# Patient Record
Sex: Male | Born: 1950 | Race: White | Hispanic: No | Marital: Married | Smoking: Former smoker
Health system: Southern US, Community
[De-identification: ages and names within clinical notes are randomized; demographics above are authoritative.]

## PROBLEM LIST (undated history)

## (undated) DIAGNOSIS — R4182 Altered mental status, unspecified: Secondary | ICD-10-CM

## (undated) DIAGNOSIS — A419 Sepsis, unspecified organism: Secondary | ICD-10-CM

## (undated) DIAGNOSIS — G894 Chronic pain syndrome: Secondary | ICD-10-CM

## (undated) DIAGNOSIS — J9621 Acute and chronic respiratory failure with hypoxia: Secondary | ICD-10-CM

## (undated) DIAGNOSIS — F411 Generalized anxiety disorder: Secondary | ICD-10-CM

## (undated) DIAGNOSIS — J309 Allergic rhinitis, unspecified: Secondary | ICD-10-CM

## (undated) DIAGNOSIS — F32A Depression, unspecified: Secondary | ICD-10-CM

## (undated) DIAGNOSIS — G7281 Critical illness myopathy: Secondary | ICD-10-CM

## (undated) DIAGNOSIS — I82C19 Acute embolism and thrombosis of unspecified internal jugular vein: Secondary | ICD-10-CM

## (undated) HISTORY — DX: Acute embolism and thrombosis of unspecified internal jugular vein: I82.C19

## (undated) HISTORY — DX: Chronic pain syndrome: G89.4

## (undated) HISTORY — DX: Acute and chronic respiratory failure with hypoxia: J96.21

## (undated) HISTORY — DX: Critical illness myopathy: G72.81

## (undated) HISTORY — DX: Altered mental status, unspecified: R41.82

## (undated) HISTORY — DX: Sepsis, unspecified organism: A41.9

---

## 2013-08-07 DIAGNOSIS — M5410 Radiculopathy, site unspecified: Secondary | ICD-10-CM | POA: Insufficient documentation

## 2019-08-04 DIAGNOSIS — M5417 Radiculopathy, lumbosacral region: Secondary | ICD-10-CM | POA: Insufficient documentation

## 2019-10-25 ENCOUNTER — Other Ambulatory Visit: Payer: Self-pay | Admitting: Internal Medicine

## 2019-10-25 DIAGNOSIS — G7281 Critical illness myopathy: Secondary | ICD-10-CM

## 2019-10-25 DIAGNOSIS — J9621 Acute and chronic respiratory failure with hypoxia: Secondary | ICD-10-CM

## 2019-10-25 DIAGNOSIS — R4182 Altered mental status, unspecified: Secondary | ICD-10-CM

## 2019-10-25 DIAGNOSIS — A419 Sepsis, unspecified organism: Secondary | ICD-10-CM

## 2019-10-25 DIAGNOSIS — I82C19 Acute embolism and thrombosis of unspecified internal jugular vein: Secondary | ICD-10-CM

## 2019-10-25 DIAGNOSIS — R6521 Severe sepsis with septic shock: Secondary | ICD-10-CM

## 2019-10-25 DIAGNOSIS — G894 Chronic pain syndrome: Secondary | ICD-10-CM

## 2019-10-26 ENCOUNTER — Other Ambulatory Visit: Payer: Self-pay | Admitting: Internal Medicine

## 2019-10-26 ENCOUNTER — Encounter: Payer: Self-pay | Admitting: Internal Medicine

## 2019-10-26 DIAGNOSIS — J9621 Acute and chronic respiratory failure with hypoxia: Secondary | ICD-10-CM

## 2019-10-26 DIAGNOSIS — G7281 Critical illness myopathy: Secondary | ICD-10-CM

## 2019-10-26 DIAGNOSIS — A419 Sepsis, unspecified organism: Secondary | ICD-10-CM | POA: Insufficient documentation

## 2019-10-26 DIAGNOSIS — I82C19 Acute embolism and thrombosis of unspecified internal jugular vein: Secondary | ICD-10-CM

## 2019-10-26 DIAGNOSIS — G894 Chronic pain syndrome: Secondary | ICD-10-CM | POA: Insufficient documentation

## 2019-10-26 DIAGNOSIS — R4182 Altered mental status, unspecified: Secondary | ICD-10-CM | POA: Insufficient documentation

## 2019-10-26 DIAGNOSIS — R6521 Severe sepsis with septic shock: Secondary | ICD-10-CM

## 2019-10-26 NOTE — Progress Notes (Signed)
[facility]  PROGRESS NOTE  PULMONARY SERVICE ROUNDS  Date of Service: 10/26/2019  Andrew Rivera  DOB: 1951/06/26  Referring physician: Larena Glassman, MD  HPI: Andrew Rivera is a 69 y.o. male  being seen for Acute on Chronic Respiratory Failure.  This morning the patient was on pressure support mode was requiring 45% oxygen.  The patient was on a pressure support level of 14/7 which the patient was tolerating relatively well.  Good tidal volumes were noted.  He does however have a great deal of anxiety issues and is requesting premedication before any weaning is done.  While I was in the room I switched him over to pressure support and actually his volumes were excellent he did not even notice that the changes were made on the ventilator.  It appears that anxiety is playing a major role and he agrees that he does have a lot of anxiety issues.  Review of Systems: Unremarkable other than noted in HPI  Allergies:  Reviewed on the [facility]  Medications: Reviewed  Vitals: Temperature 98.6 pulse 69 respiratory rate 22 blood pressure is 110/56 saturations 100%  Ventilator Settings: Temperature 98.6 pulse 69 respiratory rate 22 blood pressure 110/56 saturations 100%  Physical Exam: . General:  calm and comfortable NAD . Eyes: normal lids, irises & conjunctiva . ENT: grossly normal tongue not enlarged . Neck: no masses . Cardiovascular: S1 S2 Normal no rubs no gallop . Respiratory: Scattered rhonchi expansion is equal . Abdomen: soft non-distended . Skin: no rash seen on limited exam . Musculoskeletal:  no rigidity . Psychiatric: unable to assess . Neurologic: no involuntary movements          Lab Data and radiological Data:  No labs noted today   Assessment/Plan  Patient Active Problem List   Diagnosis Date Noted  . Acute on chronic respiratory failure with hypoxia (HCC)   . Septic shock (HCC)   . Altered mental status, unspecified   . Critical illness myopathy    . Chronic pain syndrome   . Internal jugular (IJ) vein thromboembolism, acute, unspecified laterality (HCC)       1. Acute on chronic respiratory failure with hypoxia patient was switched over to pressure support mode on 45% FiO2 as noted tidal volumes were 520 cc on a pressure support level of 14/7.  I spoke with the patient and I spoke with respiratory therapy we will try to continue to advance the weaning as tolerated. 2. Sepsis with shock resolved hemodynamics are stable we will continue with present management patient had MSSA infected hardware and will need ongoing antibiotic therapy 3. Altered mental state improved 4. Critical illness myopathy will need ongoing physical therapy for strengthening. 5. Chronic pain syndrome chronic pain pain medication and control per primary care team 6. Internal jugular thromboembolism the patient is on apixaban which should be continued.   I have personally evaluated the patient, evaluated the laboratory and imaging results and formulated the assessment and plan and placed orders as needed. The Patient requires high complexity decision making with multiple system involvement. Rounds were done with the Respiratory Therapy Director and respiratory therapist involved in the care of the patient as well as nursing staff.  Time 35 minutes extended discussion with the patient at bedside   Yevonne Pax, MD [facility] Pulmonary Critical Care Medicine   This note is for inpatient care

## 2019-10-26 NOTE — Progress Notes (Signed)
[facility]  [facility] PULMONARY SERVICE  Date of Service: 10/25/2019  PULMONARY CONSULT   Andrew Rivera  VPX:[id number]  DOB: 12-Jun-1951     Referring Physician: Larena Glassman, MD  HPI: Andrew Rivera is a 69 y.o. male seen for Acute on Chronic Respiratory Failure.  Patient has multiple medical problems including chronic pain syndrome multiple spinal fusion surgeries hyperlipidemia coronary artery disease MSSA bacteremia came into the hospital because of altered mental status.  Patient had apparently been suffering dizziness and lightheadedness prior to admission.  When the patient came in he was actually intubated but per the wife had been having disorientation fatigue.  Patient had no significant fevers or chills.  Patient subsequently failed to come off of the ventilator eventually ended up having to have a tracheostomy done.  It appears that prior to discharge patient was working with speech therapy with the PMV.  The tracheostomy was done on December 13 and patient also had a PEG tube placed on December 21.  Other complications included patient suffered critical illness myopathy and has had significant weakness.  Patient did undergo an EMG to confirm the critical illness myopathy.  He is now transferred to our facility for further management and weaning.  Review of Systems:  ROS performed and is unremarkable other than noted above.  Past Medical History Past Medical History:  Diagnosis Date  . Carpal tunnel syndrome 04/18/2013  . Cataract  . Coronary artery disease involving native coronary artery of native heart without angina pectoris 12/31/2018  . Pure hypercholesterolemia 12/31/2018  . Spinal cord stimulator status  . Varicella 1960   Past Surgical History Past Surgical History:  Procedure Laterality Date  . BACK SURGERY lower back  L4/L5  . CATARACT EXTRACTION  . COLONOSCOPY 2009  . CORNEA SURGERY  . EYE SURGERY JULY 2014  . FOOT SURGERY Right  . HERNIA REPAIR Right   inguinal  . KNEE ARTHROSCOPY W/ MENISCAL TRANSPLANT Left  . KNEE SURGERY  . LUMBAR DISCECTOMY 2006  . PR ALLOGRAFT FOR SPINE SURGERY ONLY MORSELIZED N/A 10/07/2013  Procedure: ALLOGRAFT FOR SPINE SURGERY ONLY; MORSELIZED; Surgeon: Nemiah Commander, MD; Location: MAIN OR [facility] Service: Orthopedics  . PR ANTERIOR INSTRUMENTATION 4-7 VERTEBRAL SEGMENTS N/A 10/07/2013  Procedure: ANT INSTRUM; 4 TO 7 VERTEB SEGMT CERVICAL; Surgeon: Nemiah Commander, MD; Location: MAIN OR [facility]; Service: Orthopedics  . PR APPLICATION INTERVERTEBRAL BIOMECHANICAL DEVICE N/A 10/07/2013  Procedure: APPLICATION OF INTERVERTEBRAL BIOCHEMICAL DEVICE(EG, SYNTHETIC CAGE/BONE DOWEL)TO VERTEBR DEFECT/INTERSPACE x3; Surgeon: Nemiah Commander, MD; Location: MAIN OR [facility]; Service: Orthopedics  . PR ARTHRODESIS ANT INTERBODY INC DISCECTOMY, CERVICAL BELOW C2 N/A 10/07/2013  Procedure: ARTHRODES, ANT INTRBDY, INCL DISC SPC PREP, DISCECT, OSTEOPHYT/DECOMPRESS SPINL CRD &/OR NRV RT, CRV BLO C2; Surgeon: Nemiah Commander, MD; Location: MAIN OR [facility]; Service: Orthopedics  . PR ARTHRODESIS ANT INTERBODY INC DISCECTOMY, CERVICAL BELOW C2 EACH ADDL N/A 10/07/2013  Procedure: ARTHROD, ANT INTBDY, INCL DISC SPC PREP/DISCTMY/OSTEPHYT/DECMPR SPNL CRD/NRV RT; CERV BELO C2, EA ADD`L SPC; Surgeon: Nemiah Commander, MD; Location: MAIN OR [facility]; Service: Orthopedics  . PR ARTHRODESIS ANT INTERBODY MIN DISCECTOMY, CERVICAL BELOW C2 N/A 10/07/2013  Procedure: ARTHRODESIS, ANTERIOR INTERBODY TECHNIQUE, INCLUDE MINIMAL DISKECTOMY TO PREP INTERSPACE; CERVICAL BELOW C2; Surgeon: Nemiah Commander, MD; Location: MAIN OR [facility]; Service: Orthopedics  . PR ARTHRODESIS ANT INTERBODY MIN DISCECTOMY,EA ADDL N/A 10/07/2013  Procedure: ARTHRODESIS, ANTERIOR INTERBODY TECHNIQUE, INCLUD MINIMAL DISKECTOMY TO PREP INTERSPAC; EA ADD`L INTERSPACE CERVICAL; Surgeon: Nemiah Commander, MD; Location: MAIN OR [facility]; Service: Orthopedics  .  PR ARTHRODESIS ANT INTERBODY MIN DISCECTOMY,EA ADDL N/A 04/21/2019  Procedure:  ARTHRODESIS, ANTERIOR INTERBODY TECHNIQUE, W/MINIMAL DISKECTOMY TO PREP INTERSPACE; EACH ADD`L INTERSPACE; Surgeon: Estill Batten, MD; Location: MAIN OR [facility]; Service: Neurosurgery  . PR ARTHRODESIS ANT INTERBODY MIN DISCECTOMY,LUMBAR N/A 04/21/2019  Procedure: ARTHRODESIS, ANTERIOR INTERBODY TECHNIQUE, INCLUDE MINIMAL DISKECTOMY TO PREPARE INTERSPACE; LUMBAR; Surgeon: Estill Batten, MD; Location: MAIN OR [facility]; Service: Neurosurgery  . PR ARTHRODESIS ANT INTERBODY MIN DISCECTOMY,LUMBAR N/A 04/21/2019  Procedure: ARTHRODESIS, ANTERIOR INTERBODY TECHNIQUE, INCLUDE MINIMAL DISKECTOMY TO PREPARE INTERSPACE; LUMBAR; Surgeon: Boykin Reaper, MD; Location: MAIN OR [facility]; Service: Vascular  . PR ARTHRODESIS POSTERIOR/POSTERIORLATERAL CERVICAL BELOW C2 N/A 11/19/2017  Procedure: ARTHRODESIS, POSTERIOR OR POSTEROLATERAL TECHNIQUE, SINGLE LEVEL; CERVICAL BELOW C2 SEGMENT; Surgeon: Nemiah Commander, MD; Location: [facility] OR [facility]; Service: Ortho Spine  . PR ARTHRODESIS POSTERIOR/POSTEROLATERAL EA ADDL N/A 11/19/2017  Procedure: ARTHRODESIS, POSTERIOR OR POSTEROLATERAL TECHNIQUE, SINGLE LEVEL; EACH ADDITIONAL VERTEBRAL SEGMENT x4; Surgeon: Nemiah Commander, MD; Location: [facility] OR [facility]; Service: Ortho Spine  . PR ARTHRODESIS POSTERIOR/POSTEROLATERAL EA ADDL Midline 04/24/2019  Procedure: ARTHRODESIS, POSTERIOR OR POSTEROLATERAL TECHNIQUE, SINGLE LEVEL; EACH ADDITIONAL VERTEBRAL SEGMENT; Surgeon: Estill Batten, MD; Location: MAIN OR [facility]; Service: Neurosurgery  . PR ARTHRODESIS POSTERIOR/POSTEROLATERAL LUMBAR N/A 04/24/2019  Procedure: ARTHRODESIS, POSTERIOR OR POSTEROLATERAL TECH, SINGLE LEVEL; LUMBAR(LATERAL TRANSVERSE TECHNIQUE IF DONE); Surgeon: Estill Batten, MD; Location: MAIN OR [facility]; Service: Neurosurgery  . PR ARTHRODESIS POSTERIOR/POSTEROLATERAL THORACIC Midline 04/24/2019  Procedure: ARTHRODESIS, POSTERIOR/POSTEROLATERAL TECH, SINGLE LEVEL; THORACIC(LATERAL TRANSVERSE TECHNIQUE IF  DONE); Surgeon: Estill Batten, MD; Location: MAIN OR [facility]; Service: Neurosurgery  . PR AUTOGRAFT SPINE SURGERY LOCAL FROM SAME INCISION N/A 10/07/2013  Procedure: AUTOGRAFT/SPINE SURG ONLY (W/HARVEST GRAFT); LOCAL (EG, RIB/SPINOUS PROC, LAM FRGMT) OBTAIN FROM SAME INCIS; Surgeon: Nemiah Commander, MD; Location: MAIN OR [facility]; Service: Orthopedics  . PR AUTOGRAFT SPINE SURGERY LOCAL FROM SAME INCISION N/A 11/19/2017  Procedure: AUTOGRAFT/SPINE SURG ONLY (W/HARVEST GRAFT); LOCAL (EG, RIB/SPINOUS PROC, LAM FRGMT) OBTAIN FROM SAME INCIS; Surgeon: Nemiah Commander, MD; [facility]; Service: Ortho Spine  . PR EXCHANGE LENS PROSTHESIS Left 04/11/2013  Procedure: EXCHANGE OF INTRAOCULAR LENS ZCB00 23.0, [id number] 22.0 and 21.5. Bimanual; Surgeon: Nadine Counts, MD; Location: ASC OR [facility]; Service: Ophthalmology  . PR EXCIS CERV DISK,ONE LEVEL N/A 11/19/2017  Procedure: LAMINOTOMY(HEMILAMINECT), DECOMPRES NERVE ROOTS, PART FACETECT/FORAMINOTOM &/OR EX DISC; 1 INTERSPCE CERVIC; Surgeon: Nemiah Commander, MD; Location[facility]; Service: Ortho Spine  . PR INSJ BIOMCHN DEV INTERVERTEBRAL DSC SPC W/ARTHRD N/A 04/21/2019  Procedure: INSERT INTERBODY BIOMECHANICAL DEVICE(S) WITH INTEGRAL ANTERIOR INSTRUMENT FOR DEVICE ANCHORING, WHEN PERFORMED, TO INTERVERTEBRAL DISC SPACE IN CONJUNCTION WITH INTERBODY ARTHRODESIS, EACH INTERSPACE; Surgeon: Estill Batten, MD; Location: MAIN OR [facility]; Service: Neurosurgery  . PR IONM 1 ON 1 IN OR W/ATTENDANCE EACH 15 MINUTES N/A 10/07/2013  Procedure: CONTINUOUS INTRAOPERATIVE NEUROPHYSIOLOGY MONITORING IN OR; Surgeon: Nemiah Commander, MD; Location: MAIN OR [facility]; Service: Orthopedics  . PR IONM 1 ON 1 IN OR W/ATTENDANCE EACH 15 MINUTES N/A 11/19/2017  Procedure: CONTINUOUS INTRAOPERATIVE NEUROPHYSIOLOGY MONITORING IN OR; Surgeon: Nemiah Commander, MD; Location: [facility] OR [facility]; Service: Ortho Spine  . PR IONM 1 ON 1 IN OR W/ATTENDANCE EACH 15 MINUTES N/A 04/21/2019  Procedure:  CONTINUOUS INTRAOPERATIVE NEUROPHYSIOLOGY MONITORING IN OR (sseps, emg); Surgeon: Estill Batten, MD; Location: MAIN OR [facility]; Service: Neurosurgery  . PR IONM 1 ON 1 IN OR W/ATTENDANCE EACH 15 MINUTES N/A 04/24/2019  Procedure: CONTINUOUS INTRAOPERATIVE NEUROPHYSIOLOGY MONITORING  IN OR (SSEPs, MEPs, EMG); Surgeon: Estill Batten, MD; Location: MAIN OR [facility]; Service: Neurosurgery  . PR LAMNOTMY W/DCMPRSN NRV EACH ADDL CRVCL/LMBR N/A 11/19/2017  Procedure: LAMINOTOMY(HEMILAMINECT), DECOMPRESS NERVE ROOT, PART FACETECT/FORAMENOTOMY;EA ADD`L INTERSPACE CERV/LUMBAR; Surgeon: Nemiah Commander, MD; Location: [facility]; Service: Ortho Spine  . PR OSTEOTOMY LUMB SP,POST,1 LVL Midline 04/24/2019  Procedure: Osteotomy Of Spine, Posterior Or Posterolateral Approach, 1 Segment; Lumbar; Surgeon: Estill Batten, MD; Location: MAIN OR [facility]; Service: Neurosurgery  . PR OSTEOTOMY,POST,EA ADDN SGMT Midline 04/24/2019  Procedure: Osteotomy Of Spine, Posterior Or Posterolateral Approach; 1 Vertebral Segment; Each Add`L Vertebral Segment; Surgeon: Estill Batten, MD; Location: MAIN OR [facility]; Service: Neurosurgery  . PR PELVIC FIXATION OTHER THAN SACRUM Midline 04/24/2019  Procedure: PELVIC FIXATION (ATTACHMENT OF CAUDAL END OF INSTRUMENTATION TO PELVIC BONY STRUCTURES) OTHER THAN SACRUM; Surgeon: Estill Batten, MD; Location: MAIN OR [facility]; Service: Neurosurgery  . PR POSTERIOR SEGMENTAL INSTRUMENTATION 3-6 VRT SEG N/A 11/19/2017  Procedure: POST SEGMT INSTRUM; 3 TO 6 VERTEB SEGMT CERVICAL; Surgeon: Nemiah Commander, MD; Location: [facility] OR [facility]; Service: Ortho Spine  . PR POSTERIOR SEGMENTAL INSTRUMENTATION 7-12 VRT SEG Midline 04/24/2019  Procedure: POSTERIOR SEGMENTAL INSTRUMENTATION; (EG, PEDICLE FIXATION, DUAL RODS W/MULT HOOKS/WIRES) 7-12 VERTEB SEGMT; Surgeon: Estill Batten, MD; Location: MAIN OR [facility]; Service: Neurosurgery  . PR REMV VERT BODY,CERV,ONE SGMT N/A 10/07/2013  Procedure: VERTEBRAL  CORPECTOMY-ANT W/DECOMP; CERV 1 SEGMT; Surgeon: Nemiah Commander, MD; Location: MAIN OR [facility]; Service: Orthopedics  . PR STEREOTACTIC COMP ASSIST PROC,SPINAL N/A 04/24/2019  Procedure: Stereotactic Computer-Assisted (Navigational) Procedure; Spine; Surgeon: Estill Batten, MD; Location: MAIN OR [facility]; Service: Neurosurgery  . PR XCAPSL CTRC RMVL INSJ IO LENS PROSTH W/O ECP Left 03/31/2013  Procedure: EXTRACAPSULAR CATARACT REMOVAL W/INSERTION OF INTRAOCULAR LENS PROSTHESIS, MANUAL OR MECHANICAL TECHNIQUE; Surgeon: Nadine Counts, MD; Location: [facility]; Service: Ophthalmology  . SPINAL CORD STIMULATOR IMPLANT 10/06/16  . SPINAL FUSION DEC 2014  . SPINE SURGERY   Family History Family History  Problem Relation Age of Onset  . Cataracts Father  . Squamous cell carcinoma Father  . Melanoma Father  . Cataracts Mother  . Colon cancer Mother  . Cancer Mother  none  . Anesthesia problems Neg Hx   Social History Social History   Socioeconomic History  . Marital status: Married  Spouse name: Not on file  . Number of children: Not on file  . Years of education: Not on file  . Highest education level: Not on file  Occupational History  . 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  Social Needs  . Financial resource strain: Not on file  . Food insecurity  Worry: Never true  Inability: Never true  . Transportation needs  Medical: Not on file  Non-medical: Not on file  Tobacco Use  . Smoking status: Former Smoker  Packs/day: 1.00  Years: 15.00  Pack years: 15.00  Types: Cigarettes    Allergies  Reviewed on the [facility]  Medications: Reviewed on Rounds  Physical Exam:  Vitals: Temperature 97.4 pulse 80 respiratory rate 20 blood pressure was 110/70 saturations 100%  Ventilator Settings mode of ventilation pressure assist control FiO2 is 45% tidal volume 572 with a PEEP of 7  . General: Comfortable at this time . Eyes: Grossly normal lids,  irises & conjunctiva . ENT: grossly tongue is normal . Neck: no obvious mass . Cardiovascular: S1-S2 normal no gallop or rub is noted .  Respiratory: No rhonchi coarse breath sounds . Abdomen: Soft and nontender . Skin: no rash seen on limited exam . Musculoskeletal: not rigid . Psychiatric:unable to assess . Neurologic: no seizure no involuntary movements         Labs on Admission:  Sodium 140 potassium 3.4 BUN 26 creatinine 0.5 White count 8.9 hemoglobin 7.2 hematocrit 23.4 platelet count 256 ABG pH 7.43 PCO2 48 PO2 152  Radiological Exams on Admission: * PORTABLE CHEST X-RAY  INDICATION: Shortness of breath  COMPARISON: January 2020  FINDINGS: Single frontal portable view of the chest is obtained. Right arm PICC terminating near the superior cavoatrial junction. Tracheostomy tube in place. Heart size and mediastinal contour enlarged but grossly unchanged. Spinal stimulator leads have been removed. Prior cervicothoracic posterior fusion.  The right hemidiaphragm is mildly elevated. There are bilateral pleural effusions and bilateral airspace opacities, right greater than left. There are skin folds overlying the left hemithorax.  IMPRESSION:    Bilateral pleural effusions and airspace opacities, right greater than left. Considerations include asymmetric CHF and/or superimposed pneumonia.  Right arm PICC terminating near the superior cavoatrial junction. Tracheostomy tube in place.  Electronically Signed by: Elmore Guise, MD, [facility] Electronically Signed on: 10/23/2019 6:06 PM Assessment/Plan Patient Active Problem List   Diagnosis Date Noted  . Acute on chronic respiratory failure with hypoxia ([place])   . Septic shock ([place])   . Altered mental status, unspecified   . Critical illness myopathy   . Chronic pain syndrome   . Internal jugular (IJ) vein thromboembolism, acute, unspecified laterality ([place])      1. Acute on chronic respiratory failure with  hypoxia the patient right now is on full support on pressure control mode patient's been on 45% FiO2 the plan is to continue with checking the spontaneous breathing trials and try to weaning the patient as tolerated. 2. Severe sepsis and shock secondary to MSSA bacteremia infected spinal hardware apparently patient had the spinal hardware removed when the cultures were positive for staph.  Right now hemodynamics are stable patient is improving clinically.  Has completed course of antibiotics we will continue with supportive care. 3. Altered mental state slow to improve plan is to continue to continue with supportive care patient has longstanding pain medication issues 4. Chronic pain syndrome continue with pain management 5. Critical illness myopathy seen by neurology at the other facility will need ongoing physical therapy 6. Internal jugular DVT patient is on apixaban monitor for any bleeding issues  I have personally seen and evaluated the patient, evaluated laboratory and imaging results, formulated the assessment and plan and placed orders. The Patient requires high complexity decision making with multiple systems involvement.  Case was discussed on Rounds with the Respiratory Therapy Staff Time Spent 87minutes  Allyne Gee, MD [facility]

## 2019-11-02 ENCOUNTER — Other Ambulatory Visit: Payer: Self-pay | Admitting: Internal Medicine

## 2019-11-02 DIAGNOSIS — G894 Chronic pain syndrome: Secondary | ICD-10-CM

## 2019-11-02 DIAGNOSIS — J9621 Acute and chronic respiratory failure with hypoxia: Secondary | ICD-10-CM

## 2019-11-02 DIAGNOSIS — G7281 Critical illness myopathy: Secondary | ICD-10-CM

## 2019-11-02 DIAGNOSIS — R6521 Severe sepsis with septic shock: Secondary | ICD-10-CM

## 2019-11-02 DIAGNOSIS — I82C19 Acute embolism and thrombosis of unspecified internal jugular vein: Secondary | ICD-10-CM

## 2019-11-02 DIAGNOSIS — A419 Sepsis, unspecified organism: Secondary | ICD-10-CM

## 2019-11-02 DIAGNOSIS — R4182 Altered mental status, unspecified: Secondary | ICD-10-CM

## 2019-11-02 DIAGNOSIS — G94 Other disorders of brain in diseases classified elsewhere: Secondary | ICD-10-CM

## 2019-11-02 NOTE — Progress Notes (Signed)
[facility]  PROGRESS NOTE  PULMONARY SERVICE ROUNDS  Date of Service: 11/02/2019  Andrew Rivera  DOB: [birth year]  Referring physician: Larena Glassman, MD  HPI: Andrew Rivera is a 69 y.o. male  being seen for Acute on Chronic Respiratory Failure.  Patient is weaning on pressure support has excellent volumes currently is on 12/5 he still however has a lot of anxiety issues  Review of Systems: Unremarkable other than noted in HPI  Allergies:  Reviewed on the [facility]  Medications: Reviewed  Vitals: Temperature 97.3 pulse 57 respiratory rate 16 blood pressure 106/50 saturations 100%  Ventilator Settings: Mode of ventilation pressure support FiO2 40% tidal volume 664 pressure support 12 PEEP 5  Physical Exam: . General:  calm and comfortable NAD . Eyes: normal lids, irises & conjunctiva . ENT: grossly normal tongue not enlarged . Neck: no masses . Cardiovascular: S1 S2 Normal no rubs no gallop . Respiratory: No rhonchi coarse breath sounds . Abdomen: soft non-distended . Skin: no rash seen on limited exam . Musculoskeletal:  no rigidity . Psychiatric: unable to assess . Neurologic: no involuntary movements          Lab Data and radiological Data:  No labs to report today   Assessment/Plan  Patient Active Problem List   Diagnosis Date Noted  . Acute on chronic respiratory failure with hypoxia (HCC)   . Septic shock (HCC)   . Altered mental status, unspecified   . Critical illness myopathy   . Chronic pain syndrome   . Internal jugular (IJ) vein thromboembolism, acute, unspecified laterality (HCC)       1. Acute on chronic respiratory failure with hypoxia plan is to continue with pressure support mode currently is on FiO2 of 40% tidal volume of 664 2. Sepsis with shock resolved 3. Altered mental state no change 4. Critical illness myopathy slow to improve 5. Chronic pain controlled 6. Thromboembolism treated we will continue to monitor   I have  personally evaluated the patient, evaluated the laboratory and imaging results and formulated the assessment and plan and placed orders as needed. The Patient requires high complexity decision making with multiple system involvement. Rounds were done with the Respiratory Therapy Director and respiratory therapist involved in the care of the patient as well as nursing staff.   Yevonne Pax, MD [facility] Pulmonary Critical Care Medicine   This note is for inpatient care

## 2019-11-03 ENCOUNTER — Other Ambulatory Visit: Payer: Self-pay | Admitting: Internal Medicine

## 2019-11-03 DIAGNOSIS — R4182 Altered mental status, unspecified: Secondary | ICD-10-CM

## 2019-11-03 DIAGNOSIS — A419 Sepsis, unspecified organism: Secondary | ICD-10-CM

## 2019-11-03 DIAGNOSIS — G7281 Critical illness myopathy: Secondary | ICD-10-CM

## 2019-11-03 DIAGNOSIS — J9621 Acute and chronic respiratory failure with hypoxia: Secondary | ICD-10-CM

## 2019-11-03 DIAGNOSIS — I82C19 Acute embolism and thrombosis of unspecified internal jugular vein: Secondary | ICD-10-CM

## 2019-11-03 DIAGNOSIS — R6521 Severe sepsis with septic shock: Secondary | ICD-10-CM

## 2019-11-03 DIAGNOSIS — G894 Chronic pain syndrome: Secondary | ICD-10-CM

## 2019-11-03 NOTE — Progress Notes (Signed)
[facility]  PROGRESS NOTE  PULMONARY SERVICE ROUNDS  Date of Service: 11/03/2019  Andrew Rivera  DOB: Jun 21, 1951  Referring physician: Larena Glassman, MD  HPI: Andrew Rivera is a 69 y.o. male  being seen for Acute on Chronic Respiratory Failure.  Patient is on full support currently on pressure control mode has been on 40% FiO2 the patient still continues to battle with issues with his anxiety  Review of Systems: Unremarkable other than noted in HPI  Allergies:  Reviewed on the [facility]  Medications: Reviewed  Vitals: Temperature 97.7 pulse 87 respiratory 28 blood pressure is 109/61 saturations 97%  Ventilator Settings: Mode of ventilation pressure assist control FiO2 40% tidal volume 468 PEEP 5 inspiratory pressure 16  Physical Exam: . General:  calm and comfortable NAD . Eyes: normal lids, irises & conjunctiva . ENT: grossly normal tongue not enlarged . Neck: no masses . Cardiovascular: S1 S2 Normal no rubs no gallop . Respiratory: Coarse breath sounds with a few scattered rhonchi . Abdomen: soft non-distended . Skin: no rash seen on limited exam . Musculoskeletal:  no rigidity . Psychiatric: unable to assess . Neurologic: no involuntary movements          Lab Data and radiological Data:  Sodium 140 potassium 3.1 BUN 32 creatinine 0.4 White count 5.7 hemoglobin 8.1 hematocrit 26.1 platelet count 242   Assessment/Plan  Patient Active Problem List   Diagnosis Date Noted  . Acute on chronic respiratory failure with hypoxia (HCC)   . Septic shock (HCC)   . Altered mental status, unspecified   . Critical illness myopathy   . Chronic pain syndrome   . Internal jugular (IJ) vein thromboembolism, acute, unspecified laterality (HCC)       1. Acute on chronic respiratory failure with hypoxia plan is to continue with assessing for daily weaning readiness.  Patient still has major issues with anxiety which is limiting [place] and able to wean 2. Sepsis with shock  resolved hemodynamics are stable 3. Altered mental status no change 4. Critical illness myopathy supportive care 5. Chronic pain syndrome we will continue present management 6. Internal jugular thrombosis treated we will continue with supportive care   I have personally evaluated the patient, evaluated the laboratory and imaging results and formulated the assessment and plan and placed orders as needed. The Patient requires high complexity decision making with multiple system involvement. Rounds were done with the Respiratory Therapy Director and respiratory therapist involved in the care of the patient as well as nursing staff.   Yevonne Pax, MD [facility] Pulmonary Critical Care Medicine   This note is for inpatient care

## 2019-11-04 ENCOUNTER — Other Ambulatory Visit (HOSPITAL_COMMUNITY): Payer: Medicare Other | Admitting: Internal Medicine

## 2019-11-04 DIAGNOSIS — J9621 Acute and chronic respiratory failure with hypoxia: Secondary | ICD-10-CM

## 2019-11-04 DIAGNOSIS — G894 Chronic pain syndrome: Secondary | ICD-10-CM

## 2019-11-04 DIAGNOSIS — A419 Sepsis, unspecified organism: Secondary | ICD-10-CM

## 2019-11-04 DIAGNOSIS — G7281 Critical illness myopathy: Secondary | ICD-10-CM

## 2019-11-04 DIAGNOSIS — I82C19 Acute embolism and thrombosis of unspecified internal jugular vein: Secondary | ICD-10-CM

## 2019-11-04 DIAGNOSIS — R6521 Severe sepsis with septic shock: Secondary | ICD-10-CM

## 2019-11-04 DIAGNOSIS — R4182 Altered mental status, unspecified: Secondary | ICD-10-CM

## 2019-11-04 NOTE — Progress Notes (Signed)
[facility]  PROGRESS NOTE  PULMONARY SERVICE ROUNDS  Date of Service: 11/04/2019  Emad Brechtel  DOB: 1951/04/14  Referring physician: Larena Glassman, MD  HPI: Andrew Rivera is a 69 y.o. male  being seen for Acute on Chronic Respiratory Failure.  Patient currently is on full support failing weaning attempts right now is on pressure control mode patient is able to do fine with pressure support however once he goes on T collar he gets into a great deal of anxiety  Review of Systems: Unremarkable other than noted in HPI  Allergies:  Reviewed on the [facility]  Medications: Reviewed  Vitals: Temperature 97.1 pulse 69 respiratory rate 20 blood pressure is 132/70 saturations 100%  Ventilator Settings: Mode of ventilation pressure assist control FiO2 40% tidal volume 443 and start pressure 16 PEEP 5  Physical Exam: . General:  calm and comfortable NAD . Eyes: normal lids, irises & conjunctiva . ENT: grossly normal tongue not enlarged . Neck: no masses . Cardiovascular: S1 S2 Normal no rubs no gallop . Respiratory: No rhonchi no rales are noted at this time . Abdomen: soft non-distended . Skin: no rash seen on limited exam . Musculoskeletal:  no rigidity . Psychiatric: unable to assess . Neurologic: no involuntary movements          Lab Data and radiological Data:  No labs to report today   Assessment/Plan  Patient Active Problem List   Diagnosis Date Noted  . Acute on chronic respiratory failure with hypoxia (HCC)   . Septic shock (HCC)   . Altered mental status, unspecified   . Critical illness myopathy   . Chronic pain syndrome   . Internal jugular (IJ) vein thromboembolism, acute, unspecified laterality (HCC)       1. Acute on chronic respiratory failure with hypoxia plan is to continue making attempts at weaning patient will go back into pressure support mode today and we will try T collar again 2. Altered mental status no changes we will continue with  supportive care he is at baseline 3. Critical illness myopathy no change 4. Chronic pain syndrome controlled 5. IJ thromboembolism treated we will continue with supportive care 6. Sepsis with shock hemodynamics are stable   I have personally evaluated the patient, evaluated the laboratory and imaging results and formulated the assessment and plan and placed orders as needed. The Patient requires high complexity decision making with multiple system involvement. Rounds were done with the Respiratory Therapy Director and respiratory therapist involved in the care of the patient as well as nursing staff.   Yevonne Pax, MD [facility] Pulmonary Critical Care Medicine   This note is for inpatient care

## 2019-11-05 ENCOUNTER — Other Ambulatory Visit (HOSPITAL_COMMUNITY): Payer: Medicare Other | Admitting: Internal Medicine

## 2019-11-05 DIAGNOSIS — A419 Sepsis, unspecified organism: Secondary | ICD-10-CM

## 2019-11-05 DIAGNOSIS — I82C19 Acute embolism and thrombosis of unspecified internal jugular vein: Secondary | ICD-10-CM

## 2019-11-05 DIAGNOSIS — J9621 Acute and chronic respiratory failure with hypoxia: Secondary | ICD-10-CM

## 2019-11-05 DIAGNOSIS — G7281 Critical illness myopathy: Secondary | ICD-10-CM

## 2019-11-05 DIAGNOSIS — G894 Chronic pain syndrome: Secondary | ICD-10-CM

## 2019-11-05 DIAGNOSIS — R4182 Altered mental status, unspecified: Secondary | ICD-10-CM

## 2019-11-05 DIAGNOSIS — R6521 Severe sepsis with septic shock: Secondary | ICD-10-CM

## 2019-11-05 NOTE — Progress Notes (Signed)
[facility]  PROGRESS NOTE  PULMONARY SERVICE ROUNDS  Date of Service: 11/05/2019  Andrew Rivera  DOB: [birth year]  Referring physician: Larena Glassman, MD  HPI: Andrew Rivera is a 69 y.o. male  being seen for Acute on Chronic Respiratory Failure.  Patient currently on full support on pressure control mode has been on 40% FiO2 the patient has significant elevation of the right hemidiaphragm concerning to be consistent with paralysis spoke with the team during rounds and recommended that we get a sniff test on the patient  Review of Systems: Unremarkable other than noted in HPI  Allergies:  Reviewed on the [facility]  Medications: Reviewed  Vitals: Temperature is 97.2 pulse 63 respiratory 22 blood pressure 140/84 saturations 98%  Ventilator Settings: On the vent pressure assist control FiO2 40% respiratory pressure 12 PEEP 5  Physical Exam: . General:  calm and comfortable NAD . Eyes: normal lids, irises & conjunctiva . ENT: grossly normal tongue not enlarged . Neck: no masses . Cardiovascular: S1 S2 Normal no rubs no gallop . Respiratory: No rhonchi coarse breath sounds . Abdomen: soft non-distended . Skin: no rash seen on limited exam . Musculoskeletal:  no rigidity . Psychiatric: unable to assess . Neurologic: no involuntary movements          Lab Data and radiological Data:  No labs today   Assessment/Plan  Patient Active Problem List   Diagnosis Date Noted  . Acute on chronic respiratory failure with hypoxia (HCC)   . Septic shock (HCC)   . Altered mental status, unspecified   . Critical illness myopathy   . Chronic pain syndrome   . Internal jugular (IJ) vein thromboembolism, acute, unspecified laterality (HCC)       1. Acute on chronic respiratory failure with hypoxia the plan is to continue with full vent support and continue to assess daily for weaning readiness.  Patient has significant diaphragmatic weakness this would explain why he is failing  on weaning trials 2. Sepsis with shock hemodynamics are stable right now 3. Altered mental state patient is at his baseline 4. Critical illness significant weakness noted we will continue with supportive care 5. Chronic pain controlled 6. IJ thrombosis anticoagulation supportive care   I have personally evaluated the patient, evaluated the laboratory and imaging results and formulated the assessment and plan and placed orders as needed.  Time 35 minutes The Patient requires high complexity decision making with multiple system involvement. Rounds were done with the Respiratory Therapy Director and respiratory therapist involved in the care of the patient as well as nursing staff.   Yevonne Pax, MD [facility] Pulmonary Critical Care Medicine   This note is for inpatient care

## 2019-11-06 ENCOUNTER — Other Ambulatory Visit (HOSPITAL_COMMUNITY): Payer: Medicare Other | Admitting: Internal Medicine

## 2019-11-06 DIAGNOSIS — G7281 Critical illness myopathy: Secondary | ICD-10-CM

## 2019-11-06 DIAGNOSIS — J9621 Acute and chronic respiratory failure with hypoxia: Secondary | ICD-10-CM

## 2019-11-06 DIAGNOSIS — R6521 Severe sepsis with septic shock: Secondary | ICD-10-CM

## 2019-11-06 DIAGNOSIS — G894 Chronic pain syndrome: Secondary | ICD-10-CM

## 2019-11-06 DIAGNOSIS — R4182 Altered mental status, unspecified: Secondary | ICD-10-CM

## 2019-11-06 DIAGNOSIS — A419 Sepsis, unspecified organism: Secondary | ICD-10-CM

## 2019-11-06 DIAGNOSIS — I82C19 Acute embolism and thrombosis of unspecified internal jugular vein: Secondary | ICD-10-CM

## 2019-11-06 NOTE — Progress Notes (Signed)
[facility]  PROGRESS NOTE  PULMONARY SERVICE ROUNDS  Date of Service: 11/06/2019  Andrew Rivera  DOB: [birth year]  Referring physician: Larena Glassman, MD  HPI: Andrew Rivera is a 69 y.o. male  being seen for Acute on Chronic Respiratory Failure.  Patient currently is on full support has been on pressure control mode on 40% FiO2 right now is on a PEEP of 5 good volumes are noted.  Mechanics however still quite poor and patient is not able to tolerate weaning.  The patient had a sniff test on and it appears the patient has bilateral decreased excursions of his diaphragm which may be suggestive of bilateral diaphragmatic weakness.  This obviously is going to make it quite difficult for him to be weaned off the ventilator for any prolonged periods of time  Review of Systems: Unremarkable other than noted in HPI  Allergies:  Reviewed on the [facility]  Medications: Reviewed  Vitals: Temperature 96.8 pulse 59 respiratory rate 16 blood pressure is 108/56 saturations 100%  Ventilator Settings: Mode of ventilation pressure assist control FiO2 40% and start pressure 16 PEEP 5  Physical Exam: . General:  calm and comfortable NAD . Eyes: normal lids, irises & conjunctiva . ENT: grossly normal tongue not enlarged . Neck: no masses . Cardiovascular: S1 S2 Normal no rubs no gallop . Respiratory: Coarse breath sounds no rhonchi . Abdomen: soft non-distended . Skin: no rash seen on limited exam . Musculoskeletal:  no rigidity . Psychiatric: unable to assess . Neurologic: no involuntary movements          Lab Data and radiological Data:  Sodium 140 potassium 3.3 BUN 30 creatinine 0.4 White count 5 hemoglobin 8.4 hematocrit 26.7 platelet count 225    Chest fluoroscopy, November 06, 2019.  HISTORY: Failure to wean from ventilator, evaluate diaphragmatic excursion, perform sniff test.    TECHNIQUE: Fluoroscopy was performed centered at the diaphragms during normal ventilator  driven respirations. Patient was then recently disconnected from the ventilator and asked to perform sniff.  COMPARISON: Prior chest x-rays and CT scan from several prior studies. Period  FINDINGS: Patient has chronic elevation of the right hemidiaphragm. This appeared to increase in the interim between 2017 studies and 2019 studies.  During normal ventilator driven respirations, there is very little diaphragmatic excursion on either side. During sniff test, there was also very little diaphragmatic excursion on either side. There was no paradoxical diaphragmatic motion detected.  IMPRESSION: 1. Chronic elevation right hemidiaphragm. 2. Very little diaphragmatic excursion on either side  Electronically Signed by: Ebony Hail, MD, [facility] Electronically Signed on: 11/06/2019 10:49 AM  Assessment/Plan  Patient Active Problem List   Diagnosis Date Noted  . Acute on chronic respiratory failure with hypoxia (HCC)   . Septic shock (HCC)   . Altered mental status, unspecified   . Critical illness myopathy   . Chronic pain syndrome   . Internal jugular (IJ) vein thromboembolism, acute, unspecified laterality (HCC)       1. Acute on chronic respiratory failure with hypoxia plan is to continue with full vent support the findings of the sniff test are poor prognostically.  And a think he is going to have a great deal of difficulty being completely liberated from the ventilator. 2. Sepsis with shock resolved hemodynamics are stable 3. Critical illness myopathy no change supportive care 4. Chronic pain syndrome controlled 5. IJ thrombosis treated we will continue to follow 6. Diaphragmatic weakness as already noted will be limiting factor for ability  to wean off the ventilator   I have personally evaluated the patient, evaluated the laboratory and imaging results and formulated the assessment and plan and placed orders as needed.  Time 35 minutes extending discussion with staff  and treatment team The Patient requires high complexity decision making with multiple system involvement. Rounds were done with the Respiratory Therapy Director and respiratory therapist involved in the care of the patient as well as nursing staff.   Allyne Gee, MD [facility] Pulmonary Critical Care Medicine   This note is for inpatient care

## 2019-11-07 ENCOUNTER — Other Ambulatory Visit (HOSPITAL_COMMUNITY): Payer: Medicare Other | Admitting: Internal Medicine

## 2019-11-07 DIAGNOSIS — I82C19 Acute embolism and thrombosis of unspecified internal jugular vein: Secondary | ICD-10-CM

## 2019-11-07 DIAGNOSIS — A419 Sepsis, unspecified organism: Secondary | ICD-10-CM

## 2019-11-07 DIAGNOSIS — G7281 Critical illness myopathy: Secondary | ICD-10-CM

## 2019-11-07 DIAGNOSIS — G894 Chronic pain syndrome: Secondary | ICD-10-CM

## 2019-11-07 DIAGNOSIS — R6521 Severe sepsis with septic shock: Secondary | ICD-10-CM

## 2019-11-07 DIAGNOSIS — R4182 Altered mental status, unspecified: Secondary | ICD-10-CM

## 2019-11-07 DIAGNOSIS — J9621 Acute and chronic respiratory failure with hypoxia: Secondary | ICD-10-CM

## 2019-11-07 NOTE — Progress Notes (Signed)
[facility]  PROGRESS NOTE  PULMONARY SERVICE ROUNDS  Date of Service: 11/07/2019  Andrew Rivera  DOB: November 19, 1950  Referring physician: Larena Glassman, MD  HPI: Andrew Rivera is a 69 y.o. male  being seen for Acute on Chronic Respiratory Failure.  Patient is on pressure support mode currently on 40% FiO2 good tidal volumes are noted at this time  Review of Systems: Unremarkable other than noted in HPI  Allergies:  Reviewed on the [facility]  Medications: Reviewed  Vitals: Temperature 97.2 pulse 58 respiratory rate 16 blood pressure is 118/54 saturations 100%  Ventilator Settings: Mode of ventilation pressure support FiO2 40% pressure support 10 PEEP 5 tidal volume 410  Physical Exam: . General:  calm and comfortable NAD . Eyes: normal lids, irises & conjunctiva . ENT: grossly normal tongue not enlarged . Neck: no masses . Cardiovascular: S1 S2 Normal no rubs no gallop . Respiratory: No rhonchi no rales are noted at this time . Abdomen: soft non-distended . Skin: no rash seen on limited exam . Musculoskeletal:  no rigidity . Psychiatric: unable to assess . Neurologic: no involuntary movements          Lab Data and radiological Data:  No labs to report   Assessment/Plan  Patient Active Problem List   Diagnosis Date Noted  . Acute on chronic respiratory failure with hypoxia (HCC)   . Septic shock (HCC)   . Altered mental status, unspecified   . Critical illness myopathy   . Chronic pain syndrome   . Internal jugular (IJ) vein thromboembolism, acute, unspecified laterality (HCC)       1. 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 Altered mental status no changes we will continue with supportive care 3. Critical illness myopathy therapy as tolerated 4. Chronic pain  syndrome controlled 5. IJ thromboembolism treated 6. Sepsis with shock resolved   I have personally evaluated the patient, evaluated the laboratory and imaging results and formulated the assessment and plan and placed orders as needed. The Patient requires high complexity decision making with multiple system involvement. Rounds were done with the Respiratory Therapy Director and respiratory therapist involved in the care of the patient as well as nursing staff.   Yevonne Pax, MD [facility] Pulmonary Critical Care Medicine   This note is for inpatient care

## 2019-11-08 ENCOUNTER — Other Ambulatory Visit (HOSPITAL_COMMUNITY): Payer: Medicare Other | Admitting: Internal Medicine

## 2019-11-08 DIAGNOSIS — G7281 Critical illness myopathy: Secondary | ICD-10-CM

## 2019-11-08 DIAGNOSIS — R6521 Severe sepsis with septic shock: Secondary | ICD-10-CM

## 2019-11-08 DIAGNOSIS — A419 Sepsis, unspecified organism: Secondary | ICD-10-CM

## 2019-11-08 DIAGNOSIS — J9621 Acute and chronic respiratory failure with hypoxia: Secondary | ICD-10-CM

## 2019-11-08 DIAGNOSIS — G894 Chronic pain syndrome: Secondary | ICD-10-CM

## 2019-11-08 DIAGNOSIS — I82C19 Acute embolism and thrombosis of unspecified internal jugular vein: Secondary | ICD-10-CM

## 2019-11-08 DIAGNOSIS — R4182 Altered mental status, unspecified: Secondary | ICD-10-CM

## 2019-11-08 NOTE — Progress Notes (Signed)
[facility]  PROGRESS NOTE  PULMONARY SERVICE ROUNDS  Date of Service: 11/08/2019  Andrew Rivera  DOB: [birth year]  Referring physician: Larena Glassman, MD  HPI: Andrew Rivera is a 69 y.o. male  being seen for Acute on Chronic Respiratory Failure.  Patient currently is on pressure support mode I spoke to him regarding the findings of his sniff test patient has diaphragmatic paresis and while it may not immediately recover we need to continue with making attempts at weaning him to try to improve his strength.  Review of Systems: Unremarkable other than noted in HPI  Allergies:  Reviewed on the [facility]  Medications: Reviewed  Vitals: Temperature 97.1 pulse 63 respiratory rate 16 blood pressure 110/52 saturations 97%  Ventilator Settings: Mode of ventilation pressure support FiO2 40% tidal volume 513 pressure support 10 PEEP 5  Physical Exam: . General:  calm and comfortable NAD . Eyes: normal lids, irises & conjunctiva . ENT: grossly normal tongue not enlarged . Neck: no masses . Cardiovascular: S1 S2 Normal no rubs no gallop . Respiratory: Scattered rhonchi expansion is equal . Abdomen: soft non-distended . Skin: no rash seen on limited exam . Musculoskeletal:  no rigidity . Psychiatric: unable to assess . Neurologic: no involuntary movements          Lab Data and radiological Data:  No labs today   Assessment/Plan  Patient Active Problem List   Diagnosis Date Noted  . Acute on chronic respiratory failure with hypoxia (HCC)   . Septic shock (HCC)   . Altered mental status, unspecified   . Critical illness myopathy   . Chronic pain syndrome   . Internal jugular (IJ) vein thromboembolism, acute, unspecified laterality (HCC)       1. Acute on chronic respiratory failure with hypoxia plan continue with weaning on pressure support as patient is able to tolerate 2. Severe sepsis with shock hemodynamics are stable 3. Altered mental status no  change 4. Critical illness myopathy patient is at baseline 5. Chronic pain syndrome controlled 6. Internal jugular vein thrombosis treated we will continue to monitor closely   I have personally evaluated the patient, evaluated the laboratory and imaging results and formulated the assessment and plan and placed orders as needed. The Patient requires high complexity decision making with multiple system involvement. Rounds were done with the Respiratory Therapy Director and respiratory therapist involved in the care of the patient as well as nursing staff.   Yevonne Pax, MD [facility] Pulmonary Critical Care Medicine   This note is for inpatient care

## 2019-11-09 ENCOUNTER — Other Ambulatory Visit (HOSPITAL_COMMUNITY): Payer: Medicare Other | Admitting: Internal Medicine

## 2019-11-09 DIAGNOSIS — G894 Chronic pain syndrome: Secondary | ICD-10-CM

## 2019-11-09 DIAGNOSIS — R6521 Severe sepsis with septic shock: Secondary | ICD-10-CM

## 2019-11-09 DIAGNOSIS — I82C19 Acute embolism and thrombosis of unspecified internal jugular vein: Secondary | ICD-10-CM

## 2019-11-09 DIAGNOSIS — G7281 Critical illness myopathy: Secondary | ICD-10-CM

## 2019-11-09 DIAGNOSIS — J9621 Acute and chronic respiratory failure with hypoxia: Secondary | ICD-10-CM

## 2019-11-09 DIAGNOSIS — A419 Sepsis, unspecified organism: Secondary | ICD-10-CM

## 2019-11-09 DIAGNOSIS — R4182 Altered mental status, unspecified: Secondary | ICD-10-CM

## 2019-11-09 NOTE — Progress Notes (Signed)
[facility]  PROGRESS NOTE  PULMONARY SERVICE ROUNDS  Date of Service: 11/09/2019  Andrew Rivera  DOB: [birth year]  Referring physician: Larena Glassman, MD  HPI: Andrew Rivera is a 69 y.o. male  being seen for Acute on Chronic Respiratory Failure.  Patient is on the ventilator no change has been still having a great deal of difficulty tolerating any weaning attempts.  Right now is on pressure control mode and is requiring 60% FiO2 the patient has a weak diaphragm which is limiting [place] in terms of being able to do any weaning with him at this time but we need to continue to do pressure support so that we can encourage strengthening  Review of Systems: Unremarkable other than noted in HPI  Allergies:  Reviewed on the [facility]  Medications: Reviewed  Vitals: Temperature 97.2 pulse 63 respiratory rate 16 blood pressure is 120/56 saturations 97%  Ventilator Settings: Mode of ventilation pressure assist control FiO2 60% tidal volume 503 inspiratory pressure 16 PEEP 7  Physical Exam: . General:  calm and comfortable NAD . Eyes: normal lids, irises & conjunctiva . ENT: grossly normal tongue not enlarged . Neck: no masses . Cardiovascular: S1 S2 Normal no rubs no gallop . Respiratory: No rhonchi coarse breath sounds noted . Abdomen: soft non-distended . Skin: no rash seen on limited exam . Musculoskeletal:  no rigidity . Psychiatric: unable to assess . Neurologic: no involuntary movements          Lab Data and radiological Data:  No labs to report   Assessment/Plan  Patient Active Problem List   Diagnosis Date Noted  . Acute on chronic respiratory failure with hypoxia (HCC)   . Septic shock (HCC)   . Altered mental status, unspecified   . Critical illness myopathy   . Chronic pain syndrome   . Internal jugular (IJ) vein thromboembolism, acute, unspecified laterality (HCC)       1. Acute on chronic respiratory failure with hypoxia plan is to continue with full  support on pressure control mode patient is on 60% FiO2 has been not tolerating spontaneous breathing trials very well so as an alternative we can try weaning him on pressure support mode 2. Altered mental status he is at baseline right now improved 3. Critical illness myopathy significant weakness still present 4. Chronic pain syndrome controlled we will continue with pain management 5. IJ thromboembolism treated we will continue with supportive care 6. Sepsis with shock resolved   I have personally evaluated the patient, evaluated the laboratory and imaging results and formulated the assessment and plan and placed orders as needed.  Time 35 minutes The Patient requires high complexity decision making with multiple system involvement. Rounds were done with the Respiratory Therapy Director and respiratory therapist involved in the care of the patient as well as nursing staff.   Yevonne Pax, MD [facility] Pulmonary Critical Care Medicine   This note is for inpatient care

## 2019-11-10 ENCOUNTER — Other Ambulatory Visit (HOSPITAL_COMMUNITY): Payer: Medicare Other | Admitting: Internal Medicine

## 2019-11-10 DIAGNOSIS — R6521 Severe sepsis with septic shock: Secondary | ICD-10-CM

## 2019-11-10 DIAGNOSIS — R4182 Altered mental status, unspecified: Secondary | ICD-10-CM

## 2019-11-10 DIAGNOSIS — A419 Sepsis, unspecified organism: Secondary | ICD-10-CM

## 2019-11-10 DIAGNOSIS — I82C19 Acute embolism and thrombosis of unspecified internal jugular vein: Secondary | ICD-10-CM

## 2019-11-10 DIAGNOSIS — G7281 Critical illness myopathy: Secondary | ICD-10-CM

## 2019-11-10 DIAGNOSIS — G894 Chronic pain syndrome: Secondary | ICD-10-CM

## 2019-11-10 DIAGNOSIS — J9621 Acute and chronic respiratory failure with hypoxia: Secondary | ICD-10-CM

## 2019-11-10 NOTE — Progress Notes (Signed)
[place] NOTE  PULMONARY SERVICE ROUNDS  Date of Service: 11/10/2019  Lamoine Magallon  DOB: July 23, 1951  Referring physician: Deanne Coffer, MD  HPI: Deveon Kisiel is a 69 y.o. male  being seen for Acute on Chronic Respiratory Failure.  Patient was doing well this morning however reportedly had increased work of breathing noted and had an ABG done which showed pH of 7.30 PCO2 of 70 patient's respiratory rate was increased oxygenation was not a major issue.  Also patient had a chest x-ray done which did not reveal pneumothorax but did have atelectasis and infiltrates.  Review of Systems: Unremarkable other than noted in HPI  Allergies:  Reviewed on the [facility]  Medications: Reviewed  Vitals: Temperature 97.1 pulse 90 respiratory rate 17 blood pressure 94/60 saturations 99%  Ventilator Settings: Mode of ventilation pressure assist control FiO2 60% tidal line 445 respiratory pressure 16 PEEP 7  Physical Exam: . General:  calm and comfortable NAD . Eyes: normal lids, irises & conjunctiva . ENT: grossly normal tongue not enlarged . Neck: no masses . Cardiovascular: S1 S2 Normal no rubs no gallop . Respiratory: Scattered coarse breath sounds diminished at the base . Abdomen: soft non-distended . Skin: no rash seen on limited exam . Musculoskeletal:  no rigidity . Psychiatric: unable to assess . Neurologic: no involuntary movements          Lab Data and radiological Data:  Sodium 138 potassium 3.1 BUN 48 creatinine 0.9 ABG pH 7.30 PCO2 70 PO2 73   EXAM: Portable chest one view on 11/10/2019 at 1859  hours.  HISTORY: J96.20 Acute and chronic respiratory failure, unspecified whether with hypoxia or hypercapnia . Shortness of breath  TECHNIQUE: AP portable view of the chest was performed.   FINDINGS/IMPRESSION:  1. No significant change from most recent exam. 2. Persistent bilateral pleural effusions and bilateral lower lung zone infiltrates or  atelectasis very similar in appearance to previous exam. 3. Tracheostomy tube tip is again seen with tip at level of clavicles.   Electronically Signed by: Cecilio Asper, MD, [facility] Radiology Electronically Signed on: 11/10/2019 7:13 PM  Assessment/Plan  Patient Active Problem List   Diagnosis Date Noted  . Acute on chronic respiratory failure with hypoxia ([place])   . Septic shock ([place])   . Altered mental status, unspecified   . Critical illness myopathy   . Chronic pain syndrome   . Internal jugular (IJ) vein thromboembolism, acute, unspecified laterality ([place])       1. Acute on chronic respiratory failure with hypoxia with acute changes noted no obvious pneumothorax no worsening of mucous plugging atelectasis patient had increased work of breathing noted.  He was also more somnolent.  Unclear if the patient had medication which might be causing the change in mental status.  I did recommend that we increase his ventilatory support for now and keep him on the higher support overnight and will reassess in the morning.  We can monitor with ABGs overnight 2. Altered mental status as mentioned above change which is likely secondary to the increased PCO2 noted on the ABG will try to blow off some of the CO2 by increasing the ventilation. 3. Sepsis with shock resolved hemodynamics are stable 4. Critical illness myopathy no change 5. Chronic pain syndrome controlled 6. IJ thrombosis patient has been on anticoagulation if the issues do not resolve we will consider CT angio   I have personally evaluated the patient, evaluated the laboratory and imaging results and formulated the  assessment and plan and placed orders as needed.  Time 35 minutes acute change in status The Patient requires high complexity decision making with multiple system involvement. Rounds were done with the Respiratory Therapy Director and respiratory therapist involved in the care of the patient as well as nursing  staff.   Yevonne Pax, MD [facility] Pulmonary Critical Care Medicine   This note is for inpatient care

## 2019-11-11 ENCOUNTER — Other Ambulatory Visit (HOSPITAL_COMMUNITY): Payer: Medicare Other | Admitting: Internal Medicine

## 2019-11-11 DIAGNOSIS — A419 Sepsis, unspecified organism: Secondary | ICD-10-CM

## 2019-11-11 DIAGNOSIS — J9621 Acute and chronic respiratory failure with hypoxia: Secondary | ICD-10-CM

## 2019-11-11 DIAGNOSIS — R4182 Altered mental status, unspecified: Secondary | ICD-10-CM

## 2019-11-11 DIAGNOSIS — R6521 Severe sepsis with septic shock: Secondary | ICD-10-CM

## 2019-11-11 DIAGNOSIS — I82C19 Acute embolism and thrombosis of unspecified internal jugular vein: Secondary | ICD-10-CM

## 2019-11-11 DIAGNOSIS — G7281 Critical illness myopathy: Secondary | ICD-10-CM

## 2019-11-11 DIAGNOSIS — G894 Chronic pain syndrome: Secondary | ICD-10-CM

## 2019-11-11 NOTE — Progress Notes (Signed)
[facility]  PROGRESS NOTE  PULMONARY SERVICE ROUNDS  Date of Service: 11/11/2019  Andrew Rivera  DOB: November 09, 1950  Referring physician: Larena Glassman, MD  HPI: Andrew Rivera is a 69 y.o. male  being seen for Acute on Chronic Respiratory Failure.  Patient looks a little bit better ABG also improved this morning.  The patient's pH was 7.46 and PCO2 was down to 43 he is more awake.  It appears the patient did get some sedation yesterday which likely explains why he became so hypercapnic  Review of Systems: Unremarkable other than noted in HPI  Allergies:  Reviewed on the [facility]  Medications: Reviewed  Vitals: Temperature 99.2 pulse 80 respiratory rate 25 blood pressure is 113/56 saturations 99%  Ventilator Settings: Mode ventilation pressure assist control FiO2 40% expiratory pressure 22 tidal volume 486 PEEP 5  Physical Exam: . General:  calm and comfortable NAD . Eyes: normal lids, irises & conjunctiva . ENT: grossly normal tongue not enlarged . Neck: no masses . Cardiovascular: S1 S2 Normal no rubs no gallop . Respiratory: No rhonchi no rales are noted at this time . Abdomen: soft non-distended . Skin: no rash seen on limited exam . Musculoskeletal:  no rigidity . Psychiatric: unable to assess . Neurologic: no involuntary movements          Lab Data and radiological Data:  Sodium 137 potassium 3.1 BUN 66 creatinine 1.1 White count 27.9 hemoglobin 7.5 hematocrit 23.7 platelet count 130   Assessment/Plan  Patient Active Problem List   Diagnosis Date Noted  . Acute on chronic respiratory failure with hypoxia (HCC)   . Septic shock (HCC)   . Altered mental status, unspecified   . Critical illness myopathy   . Chronic pain syndrome   . Internal jugular (IJ) vein thromboembolism, acute, unspecified laterality (HCC)       1. Acute on chronic respiratory failure with hypoxia plan is to continue with full support on pressure control mode at this time his  ABG is now slightly overcorrected will need to back off on his respiratory rate 2. Sepsis with shock resolved hemodynamics are stable 3. Altered mental state improving 4. Critical illness myopathy therapy as tolerated 5. Chronic pain treated 6. Internal jugular thrombosis no change we will continue with supportive care   I have personally evaluated the patient, evaluated the laboratory and imaging results and formulated the assessment and plan and placed orders as needed. The Patient requires high complexity decision making with multiple system involvement. Rounds were done with the Respiratory Therapy Director and respiratory therapist involved in the care of the patient as well as nursing staff.   Yevonne Pax, MD [facility] Pulmonary Critical Care Medicine   This note is for inpatient care

## 2019-11-12 ENCOUNTER — Other Ambulatory Visit (HOSPITAL_COMMUNITY): Payer: Medicare Other | Admitting: Internal Medicine

## 2019-11-12 DIAGNOSIS — A419 Sepsis, unspecified organism: Secondary | ICD-10-CM

## 2019-11-12 DIAGNOSIS — G7281 Critical illness myopathy: Secondary | ICD-10-CM

## 2019-11-12 DIAGNOSIS — R4182 Altered mental status, unspecified: Secondary | ICD-10-CM

## 2019-11-12 DIAGNOSIS — R6521 Severe sepsis with septic shock: Secondary | ICD-10-CM

## 2019-11-12 DIAGNOSIS — G894 Chronic pain syndrome: Secondary | ICD-10-CM

## 2019-11-12 DIAGNOSIS — I82C19 Acute embolism and thrombosis of unspecified internal jugular vein: Secondary | ICD-10-CM

## 2019-11-12 DIAGNOSIS — J9621 Acute and chronic respiratory failure with hypoxia: Secondary | ICD-10-CM

## 2019-11-12 NOTE — Progress Notes (Signed)
[facility]  PROGRESS NOTE  PULMONARY SERVICE ROUNDS  Date of Service: 11/12/2019  Andrew Rivera  DOB: 1951-08-23  Referring physician: Larena Glassman, MD  HPI: Andrew Rivera is a 69 y.o. male  being seen for Acute on Chronic Respiratory Failure.  Patient is on full support currently is on pressure control mode on 35% FiO2 good saturations are noted  Review of Systems: Unremarkable other than noted in HPI  Allergies:  Reviewed on the [facility]  Medications: Reviewed  Vitals: Temperature 98.7 pulse 79 respiratory 25 blood pressure is 104/55 saturations 97%  Ventilator Settings: Mode of ventilation pressure assist control FiO2 35% tidal volume 502 PEEP 5 inspiratory pressure 18  Physical Exam: . General:  calm and comfortable NAD . Eyes: normal lids, irises & conjunctiva . ENT: grossly normal tongue not enlarged . Neck: no masses . Cardiovascular: S1 S2 Normal no rubs no gallop . Respiratory: No rhonchi no rales are noted at this time . Abdomen: soft non-distended . Skin: no rash seen on limited exam . Musculoskeletal:  no rigidity . Psychiatric: unable to assess . Neurologic: no involuntary movements          Lab Data and radiological Data:  Sodium 139 potassium 3.2 BUN 73 creatinine 1.0 White count 29.2 hemoglobin 7.6 hematocrit 23.2 platelet count 154   Assessment/Plan  Patient Active Problem List   Diagnosis Date Noted  . Acute on chronic respiratory failure with hypoxia (HCC)   . Septic shock (HCC)   . Altered mental status, unspecified   . Critical illness myopathy   . Chronic pain syndrome   . Internal jugular (IJ) vein thromboembolism, acute, unspecified laterality (HCC)       1. Acute on chronic respiratory failure hypoxia patient continues on full support pressure control right now is on 35% FiO2 patient's mechanics support not weaning 2. Diaphragmatic weakness we will continue with supportive care 3. Critical illness myopathy physical  therapy as tolerated 4. Chronic pain controlled IJ thrombosis treated 5. Sepsis with shock resolved   I have personally evaluated the patient, evaluated the laboratory and imaging results and formulated the assessment and plan and placed orders as needed. The Patient requires high complexity decision making with multiple system involvement. Rounds were done with the Respiratory Therapy Director and respiratory therapist involved in the care of the patient as well as nursing staff.   Yevonne Pax, MD [facility] Pulmonary Critical Care Medicine   This note is for inpatient care

## 2019-11-13 ENCOUNTER — Other Ambulatory Visit (HOSPITAL_COMMUNITY): Payer: Medicare Other | Admitting: Internal Medicine

## 2019-11-13 DIAGNOSIS — A419 Sepsis, unspecified organism: Secondary | ICD-10-CM

## 2019-11-13 DIAGNOSIS — J9621 Acute and chronic respiratory failure with hypoxia: Secondary | ICD-10-CM

## 2019-11-13 DIAGNOSIS — R6521 Severe sepsis with septic shock: Secondary | ICD-10-CM

## 2019-11-13 DIAGNOSIS — I82C19 Acute embolism and thrombosis of unspecified internal jugular vein: Secondary | ICD-10-CM

## 2019-11-13 DIAGNOSIS — R4182 Altered mental status, unspecified: Secondary | ICD-10-CM

## 2019-11-13 DIAGNOSIS — G7281 Critical illness myopathy: Secondary | ICD-10-CM

## 2019-11-13 DIAGNOSIS — G894 Chronic pain syndrome: Secondary | ICD-10-CM

## 2019-11-13 NOTE — Progress Notes (Signed)
[facility]  PROGRESS NOTE  PULMONARY SERVICE ROUNDS  Date of Service: 11/13/2019  Destin Vinsant  DOB: Aug 21, 1951  Referring physician: Larena Glassman, MD  HPI: Andrew Rivera is a 69 y.o. male  being seen for Acute on Chronic Respiratory Failure.  Patient remains on full support on pressure control mode has been on 35% FiO2 with a respiratory pressure of 18 has also been on Levophed for low blood pressure which is being gradually weaned off  Review of Systems: Unremarkable other than noted in HPI  Allergies:  Reviewed on the [facility]  Medications: Reviewed  Vitals: Temperature 98.6 pulse 85 respiratory rate 26 blood pressure 146/62 saturations 96%  Ventilator Settings: Mode of ventilation pressure assist control FiO2 35% tidal volume 566 PEEP 5 inspiratory pressures 18  Physical Exam: . General:  calm and comfortable NAD . Eyes: normal lids, irises & conjunctiva . ENT: grossly normal tongue not enlarged . Neck: no masses . Cardiovascular: S1 S2 Normal no rubs no gallop . Respiratory: No rhonchi coarse breath sounds are noted . Abdomen: soft non-distended . Skin: no rash seen on limited exam . Musculoskeletal:  no rigidity . Psychiatric: unable to assess . Neurologic: no involuntary movements          Lab Data and radiological Data:  Sodium 140 potassium 2.9 BUN 67 creatinine 0.9 White count 28.1 hemoglobin 7.2 hematocrit 22.4 platelet count 153   Assessment/Plan  Patient Active Problem List   Diagnosis Date Noted  . Acute on chronic respiratory failure with hypoxia (HCC)   . Septic shock (HCC)   . Altered mental status, unspecified   . Critical illness myopathy   . Chronic pain syndrome   . Internal jugular (IJ) vein thromboembolism, acute, unspecified laterality (HCC)       1. Acute on chronic respiratory failure hypoxia plan is to continue with full support on pressure control mode titrate oxygen continue pulmonary toilet patient still on Levophed  which is being weaned has not been tolerating any weaning of the ventilator unfortunately 2. Sepsis with shock on Levophed we will continue with supportive care titrate to maintain MAP greater than 65 3. Altered mental state no change we will continue present management 4. Critical illness myopathy stable 5. Chronic pain syndrome continue with present management 6. IJ thrombosis treated   I have personally evaluated the patient, evaluated the laboratory and imaging results and formulated the assessment and plan and placed orders as needed.  Time 35 minutes titration of drips The Patient requires high complexity decision making with multiple system involvement. Rounds were done with the Respiratory Therapy Director and respiratory therapist involved in the care of the patient as well as nursing staff.   Yevonne Pax, MD [facility] Pulmonary Critical Care Medicine   This note is for inpatient care

## 2019-11-14 ENCOUNTER — Other Ambulatory Visit (HOSPITAL_COMMUNITY): Payer: Medicare Other | Admitting: Internal Medicine

## 2019-11-14 DIAGNOSIS — J9621 Acute and chronic respiratory failure with hypoxia: Secondary | ICD-10-CM

## 2019-11-14 DIAGNOSIS — R6521 Severe sepsis with septic shock: Secondary | ICD-10-CM

## 2019-11-14 DIAGNOSIS — A419 Sepsis, unspecified organism: Secondary | ICD-10-CM

## 2019-11-14 DIAGNOSIS — R4182 Altered mental status, unspecified: Secondary | ICD-10-CM

## 2019-11-14 DIAGNOSIS — G7281 Critical illness myopathy: Secondary | ICD-10-CM

## 2019-11-14 DIAGNOSIS — G894 Chronic pain syndrome: Secondary | ICD-10-CM

## 2019-11-14 DIAGNOSIS — I82C19 Acute embolism and thrombosis of unspecified internal jugular vein: Secondary | ICD-10-CM

## 2019-11-14 NOTE — Progress Notes (Signed)
[facility]  PROGRESS NOTE  PULMONARY SERVICE ROUNDS  Date of Service: 11/14/2019  Andrew Rivera  DOB: 20-Jan-1951  Referring physician: Larena Glassman, MD  HPI: Andrew Rivera is a 69 y.o. male  being seen for Acute on Chronic Respiratory Failure.  Patient currently is on full support and pressure control mode has been on 40% FiO2.  Patient's Decent tidal volumes on full support however does not do as well when switched over to spontaneous  Review of Systems: Unremarkable other than noted in HPI  Allergies:  Reviewed on the [facility]  Medications: Reviewed  Vitals: Temperature 97.8 pulse 80 respiratory rate 26 blood pressure 137/62 saturations 97%  Ventilator Settings: Mode of ventilation pressure assist control FiO2 is 40% tidal volume 452 PEEP 5  Physical Exam: . General:  calm and comfortable NAD . Eyes: normal lids, irises & conjunctiva . ENT: grossly normal tongue not enlarged . Neck: no masses . Cardiovascular: S1 S2 Normal no rubs no gallop . Respiratory: No rhonchi no rales are noted at this time . Abdomen: soft non-distended . Skin: no rash seen on limited exam . Musculoskeletal:  no rigidity . Psychiatric: unable to assess . Neurologic: no involuntary movements          Lab Data and radiological Data:  Data has been reviewed   Assessment/Plan  Patient Active Problem List   Diagnosis Date Noted  . Acute on chronic respiratory failure with hypoxia (HCC)   . Septic shock (HCC)   . Altered mental status, unspecified   . Critical illness myopathy   . Chronic pain syndrome   . Internal jugular (IJ) vein thromboembolism, acute, unspecified laterality (HCC)       1. Acute on chronic respiratory failure with hypoxia plan is going to be to continue with the attempts at pressure support weaning.  Right now is on full support and pressure control mode 2. Sepsis with shock resolved 3. Altered mental status no change 4. Critical illness myopathy physical  therapy as tolerated 5. Chronic pain controlled 6. IJ thrombosis anticoagulation   I have personally evaluated the patient, evaluated the laboratory and imaging results and formulated the assessment and plan and placed orders as needed. The Patient requires high complexity decision making with multiple system involvement. Rounds were done with the Respiratory Therapy Director and respiratory therapist involved in the care of the patient as well as nursing staff.   Yevonne Pax, MD [facility] Pulmonary Critical Care Medicine   This note is for inpatient care

## 2019-11-15 ENCOUNTER — Other Ambulatory Visit (HOSPITAL_COMMUNITY): Payer: Medicare Other | Admitting: Internal Medicine

## 2019-11-15 DIAGNOSIS — A419 Sepsis, unspecified organism: Secondary | ICD-10-CM

## 2019-11-15 DIAGNOSIS — R4182 Altered mental status, unspecified: Secondary | ICD-10-CM

## 2019-11-15 DIAGNOSIS — G894 Chronic pain syndrome: Secondary | ICD-10-CM

## 2019-11-15 DIAGNOSIS — J9621 Acute and chronic respiratory failure with hypoxia: Secondary | ICD-10-CM

## 2019-11-15 DIAGNOSIS — G7281 Critical illness myopathy: Secondary | ICD-10-CM

## 2019-11-15 DIAGNOSIS — R6521 Severe sepsis with septic shock: Secondary | ICD-10-CM

## 2019-11-15 DIAGNOSIS — I82C19 Acute embolism and thrombosis of unspecified internal jugular vein: Secondary | ICD-10-CM

## 2019-11-15 NOTE — Progress Notes (Signed)
[facility]  PROGRESS NOTE  PULMONARY SERVICE ROUNDS  Date of Service: 11/15/2019  Andrew Rivera  DOB: [birth year]  Referring physician: Larena Glassman, MD  HPI: Andrew Rivera is a 69 y.o. male  being seen for Acute on Chronic Respiratory Failure.  Patient currently is on full support on pressure control mode has been on 35% FiO2 good saturations are noted  Review of Systems: Unremarkable other than noted in HPI  Allergies:  Reviewed on the [facility]  Medications: Reviewed  Vitals: Temperature 97.2 pulse 71 respiratory 26 blood pressure is 130/60 saturations 97%  Ventilator Settings: Mode of ventilation pressure assist control FiO2 35% tidal volume 449 PEEP 5 inspiratory pressures 18  Physical Exam: . General:  calm and comfortable NAD . Eyes: normal lids, irises & conjunctiva . ENT: grossly normal tongue not enlarged . Neck: no masses . Cardiovascular: S1 S2 Normal no rubs no gallop . Respiratory: No rhonchi no rales are noted at this time . Abdomen: soft non-distended . Skin: no rash seen on limited exam . Musculoskeletal:  no rigidity . Psychiatric: unable to assess . Neurologic: no involuntary movements          Lab Data and radiological Data:  Sodium 145 potassium 3.8 BUN 63 creatinine 0.7 White count 20 hemoglobin 7.2 hematocrit 25.2 platelet count 192   Assessment/Plan  Patient Active Problem List   Diagnosis Date Noted  . Acute on chronic respiratory failure with hypoxia (HCC)   . Septic shock (HCC)   . Altered mental status, unspecified   . Critical illness myopathy   . Chronic pain syndrome   . Internal jugular (IJ) vein thromboembolism, acute, unspecified laterality (HCC)       1. Acute on chronic respiratory failure with hypoxia we will continue to make attempts at weaning patient has not been tolerating consistently 2. Sepsis with shock resolved 3. Altered mental state patient is at baseline 4. Critical illness myopathy physical therapy  as tolerated 5. Chronic pain control of IJ thrombosis supportive care   I have personally evaluated the patient, evaluated the laboratory and imaging results and formulated the assessment and plan and placed orders as needed. The Patient requires high complexity decision making with multiple system involvement. Rounds were done with the Respiratory Therapy Director and respiratory therapist involved in the care of the patient as well as nursing staff.   Yevonne Pax, MD [facility] Pulmonary Critical Care Medicine   This note is for inpatient care

## 2019-11-16 ENCOUNTER — Other Ambulatory Visit (HOSPITAL_COMMUNITY): Payer: Medicare Other | Admitting: Internal Medicine

## 2019-11-16 DIAGNOSIS — G894 Chronic pain syndrome: Secondary | ICD-10-CM

## 2019-11-16 DIAGNOSIS — G7281 Critical illness myopathy: Secondary | ICD-10-CM

## 2019-11-16 DIAGNOSIS — R6521 Severe sepsis with septic shock: Secondary | ICD-10-CM

## 2019-11-16 DIAGNOSIS — R4182 Altered mental status, unspecified: Secondary | ICD-10-CM

## 2019-11-16 DIAGNOSIS — A419 Sepsis, unspecified organism: Secondary | ICD-10-CM

## 2019-11-16 DIAGNOSIS — J9621 Acute and chronic respiratory failure with hypoxia: Secondary | ICD-10-CM

## 2019-11-16 DIAGNOSIS — I82C19 Acute embolism and thrombosis of unspecified internal jugular vein: Secondary | ICD-10-CM

## 2019-11-16 NOTE — Progress Notes (Signed)
[facility]  PROGRESS NOTE  PULMONARY SERVICE ROUNDS  Date of Service: 11/16/2019  Andrew Rivera  DOB: June 16, 1951  Referring physician: Larena Glassman, MD  HPI: Andrew Rivera is a 69 y.o. male  being seen for Acute on Chronic Respiratory Failure.  Patient currently is on full support on pressure control mode is requiring 35% FiO2 good saturations are noted at this time.  Overnight patient had a code essentially with some drop in his heart rate and patient was resuscitated and also had an ABG done at that time which showed severe acidosis.  Now patient is back on the ventilator on full support.  Again because of his diaphragmatic weakness and respiratory intercostal muscle weakness patient is not doing well as far as being able to wean.  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 General:  calm and comfortable NAD . Eyes: normal lids, irises & conjunctiva . ENT: grossly normal tongue not enlarged . Neck: no masses . Cardiovascular: S1 S2 Normal no rubs no gallop . Respiratory: Scattered rhonchi expansion is equal . Abdomen: soft non-distended . Skin: no rash seen on limited exam . Musculoskeletal:  no rigidity . Psychiatric: unable to assess . Neurologic: no involuntary movements          Lab Data and radiological Data:  Sodium 144 potassium 3.7 BUN 63 creatinine 0.8 White count 16.5 hemoglobin 7.2 hematocrit 22.5 platelet count 219   Assessment/Plan  Patient Active Problem List   Diagnosis Date Noted  . Acute on  chronic respiratory failure with hypoxia (HCC)   . Septic shock (HCC)   . Altered mental status, unspecified   . Critical illness myopathy   . Chronic pain syndrome   . Internal jugular (IJ) vein thromboembolism, acute, unspecified laterality (HCC)       1. Acute on chronic respiratory failure hypoxia patient right now is on full support on the ventilator on pressure control mode as already mentioned above he had a code situation and probably not a good idea to do any weaning for now we will let the patient rest on full support and pressure control mode 2. Altered mental status patient is back at his baseline 3. Critical illness myopathy really no improvement he also has diaphragmatic weakness 4. Chronic pain syndrome controlled 5. Internal jugular thrombosis treated we will continue to follow 6. Sepsis with shock supportive care   I have personally evaluated the patient, evaluated the laboratory and imaging results and formulated the assessment and plan and placed orders as needed. The Patient requires high complexity decision making with multiple system involvement. Rounds were done with the Respiratory Therapy Director and respiratory therapist involved in the care of the patient as well as nursing staff.  Time 35 minutes   Yevonne Pax, MD [facility] Pulmonary Critical Care Medicine   This note is for inpatient care

## 2019-11-17 ENCOUNTER — Other Ambulatory Visit (HOSPITAL_COMMUNITY): Payer: Medicare Other | Admitting: Internal Medicine

## 2019-11-17 DIAGNOSIS — G894 Chronic pain syndrome: Secondary | ICD-10-CM

## 2019-11-17 DIAGNOSIS — I82C19 Acute embolism and thrombosis of unspecified internal jugular vein: Secondary | ICD-10-CM

## 2019-11-17 DIAGNOSIS — R6521 Severe sepsis with septic shock: Secondary | ICD-10-CM

## 2019-11-17 DIAGNOSIS — G7281 Critical illness myopathy: Secondary | ICD-10-CM

## 2019-11-17 DIAGNOSIS — R4182 Altered mental status, unspecified: Secondary | ICD-10-CM

## 2019-11-17 DIAGNOSIS — A419 Sepsis, unspecified organism: Secondary | ICD-10-CM

## 2019-11-17 DIAGNOSIS — J9621 Acute and chronic respiratory failure with hypoxia: Secondary | ICD-10-CM

## 2019-11-17 NOTE — Progress Notes (Signed)
[facility]  PROGRESS NOTE  PULMONARY SERVICE ROUNDS  Date of Service: 11/17/2019  Andrew Rivera  DOB: [birth year]  Referring physician: Larena Glassman, MD  HPI: Andrew Rivera is a 69 y.o. male  being seen for Acute on Chronic Respiratory Failure.  Patient right now is on full support on pressure control mode is failing weaning consistently  Review of Systems: Unremarkable other than noted in HPI  Allergies:  Reviewed on the [facility]  Medications: Reviewed  Vitals: Temperature 98.4 pulse 61 respiratory 20 blood pressure is 105/55 saturations 100%  Ventilator Settings: Mode of ventilation pressure assist control FiO2 40% tidal volume 552 PEEP 7  Physical Exam: . General:  calm and comfortable NAD . Eyes: normal lids, irises & conjunctiva . ENT: grossly normal tongue not enlarged . Neck: no masses . Cardiovascular: S1 S2 Normal no rubs no gallop . Respiratory: No rhonchi coarse breath sounds . Abdomen: soft non-distended . Skin: no rash seen on limited exam . Musculoskeletal:  no rigidity . Psychiatric: unable to assess . Neurologic: no involuntary movements          Lab Data and radiological Data:  Sodium 143 potassium 3.5 BUN 62 creatinine 0.7 White count 17.2 hemoglobin 7.4 hematocrit 23.8 platelet count 238   Assessment/Plan  Patient Active Problem List   Diagnosis Date Noted  . Acute on chronic respiratory failure with hypoxia (HCC)   . Septic shock (HCC)   . Altered mental status, unspecified   . Critical illness myopathy   . Chronic pain syndrome   . Internal jugular (IJ) vein thromboembolism, acute, unspecified laterality (HCC)       1. 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 2. Sepsis with shock hemodynamics are stable 3. Altered mental status no change 4. Critical illness myopathy physical  therapy as tolerated 5. Chronic pain control 6. IJ thrombosis treated   I have personally evaluated the patient, evaluated the laboratory and imaging results and formulated the assessment and plan and placed orders as needed. The Patient requires high complexity decision making with multiple system involvement. Rounds were done with the Respiratory Therapy Director and respiratory therapist involved in the care of the patient as well as nursing staff.   Yevonne Pax, MD [facility] Pulmonary Critical Care Medicine   This note is for inpatient care

## 2019-11-18 ENCOUNTER — Other Ambulatory Visit (HOSPITAL_COMMUNITY): Payer: Medicare Other | Admitting: Internal Medicine

## 2019-11-18 DIAGNOSIS — I82C19 Acute embolism and thrombosis of unspecified internal jugular vein: Secondary | ICD-10-CM

## 2019-11-18 DIAGNOSIS — G894 Chronic pain syndrome: Secondary | ICD-10-CM

## 2019-11-18 DIAGNOSIS — A419 Sepsis, unspecified organism: Secondary | ICD-10-CM

## 2019-11-18 DIAGNOSIS — R4182 Altered mental status, unspecified: Secondary | ICD-10-CM

## 2019-11-18 DIAGNOSIS — G7281 Critical illness myopathy: Secondary | ICD-10-CM

## 2019-11-18 DIAGNOSIS — R6521 Severe sepsis with septic shock: Secondary | ICD-10-CM

## 2019-11-18 DIAGNOSIS — J9621 Acute and chronic respiratory failure with hypoxia: Secondary | ICD-10-CM

## 2019-11-18 NOTE — Progress Notes (Signed)
[facility]  PROGRESS NOTE  PULMONARY SERVICE ROUNDS  Date of Service: 11/18/2019  Andrew Rivera  DOB: 05/01/1951  Referring physician: Larena Glassman, MD  HPI: Andrew Rivera is a 69 y.o. male  being seen for Acute on Chronic Respiratory Failure.  Patient continues to fail weaning attempts right now is on full support and pressure control mode has been on 40% FiO2 with good saturations  Review of Systems: Unremarkable other than noted in HPI  Allergies:  Reviewed on the [facility]  Medications: Reviewed  Vitals: Temperature is 98.9 pulse 62 respiratory 20 blood pressure is 150/70 saturations 100%  Ventilator Settings: Mode of ventilation pressure assist control FiO2 40% tidal volume is 569 PEEP 5  Physical Exam: . General:  calm and comfortable NAD . Eyes: normal lids, irises & conjunctiva . ENT: grossly normal tongue not enlarged . Neck: no masses . Cardiovascular: S1 S2 Normal no rubs no gallop . Respiratory: No rhonchi no rales are noted at this time . Abdomen: soft non-distended . Skin: no rash seen on limited exam . Musculoskeletal:  no rigidity . Psychiatric: unable to assess . Neurologic: no involuntary movements          Lab Data and radiological Data:  Sodium 140 potassium 3.5 BUN 57 creatinine 0.6 White count 13.2 hemoglobin 7.5 hematocrit 23.7 platelet count 291   Assessment/Plan  Patient Active Problem List   Diagnosis Date Noted  . Acute on chronic respiratory failure with hypoxia (HCC)   . Septic shock (HCC)   . Altered mental status, unspecified   . Critical illness myopathy   . Chronic pain syndrome   . Internal jugular (IJ) vein thromboembolism, acute, unspecified laterality (HCC)       1. Acute on chronic respiratory failure hypoxia plan is to continue with full support on the ventilator patient is not amenable will need long-term chemical ventilation. 2. Altered mental status no change we will continue with supportive care he is at  his baseline 3. Sepsis with shock resolved we will continue to follow along 4. Critical illness myopathy at baseline we will continue with present management. 5. Chronic pain syndrome controlled we will continue with supportive care 6. IJ thrombosis treated   I have personally evaluated the patient, evaluated the laboratory and imaging results and formulated the assessment and plan and placed orders as needed. The Patient requires high complexity decision making with multiple system involvement. Rounds were done with the Respiratory Therapy Director and respiratory therapist involved in the care of the patient as well as nursing staff.   Yevonne Pax, MD [facility] Pulmonary Critical Care Medicine   This note is for inpatient care

## 2019-11-19 ENCOUNTER — Other Ambulatory Visit (HOSPITAL_COMMUNITY): Payer: Medicare Other | Admitting: Internal Medicine

## 2019-11-19 DIAGNOSIS — R4182 Altered mental status, unspecified: Secondary | ICD-10-CM

## 2019-11-19 DIAGNOSIS — A419 Sepsis, unspecified organism: Secondary | ICD-10-CM

## 2019-11-19 DIAGNOSIS — I82C19 Acute embolism and thrombosis of unspecified internal jugular vein: Secondary | ICD-10-CM

## 2019-11-19 DIAGNOSIS — G7281 Critical illness myopathy: Secondary | ICD-10-CM

## 2019-11-19 DIAGNOSIS — J9621 Acute and chronic respiratory failure with hypoxia: Secondary | ICD-10-CM

## 2019-11-19 DIAGNOSIS — R6521 Severe sepsis with septic shock: Secondary | ICD-10-CM

## 2019-11-19 DIAGNOSIS — G894 Chronic pain syndrome: Secondary | ICD-10-CM

## 2019-11-19 NOTE — Progress Notes (Signed)
[facility]  PROGRESS NOTE  PULMONARY SERVICE ROUNDS  Date of Service: 11/19/2019  Andrew Rivera  DOB: 02-24-1951  Referring physician: Larena Glassman, MD  HPI: Andrew Rivera is a 69 y.o. male  being seen for Acute on Chronic Respiratory Failure.  Patient remains on the ventilator full support has been consistently failing attempts at weaning  Review of Systems: Unremarkable other than noted in HPI  Allergies:  Reviewed on the [facility]  Medications: Reviewed  Vitals: Temperature is 99.5 pulse 62 respiratory 20 blood pressure is 130/70 saturations 99%  Ventilator Settings: Mode ventilation pressure assist control FiO2 40% inspiratory pressure 26 PEEP 7  Physical Exam: . General:  calm and comfortable NAD . Eyes: normal lids, irises & conjunctiva . ENT: grossly normal tongue not enlarged . Neck: no masses . Cardiovascular: S1 S2 Normal no rubs no gallop . Respiratory: No rhonchi coarse breath sounds are noted . Abdomen: soft non-distended . Skin: no rash seen on limited exam . Musculoskeletal:  no rigidity . Psychiatric: unable to assess . Neurologic: no involuntary movements          Lab Data and radiological Data:  Sodium 136 potassium 3.4 BUN 50 creatinine 0.6 White count 10.4 hemoglobin 7.2 hematocrit 22.6 platelet count 303   Assessment/Plan  Patient Active Problem List   Diagnosis Date Noted  . Acute on chronic respiratory failure with hypoxia (HCC)   . Septic shock (HCC)   . Altered mental status, unspecified   . Critical illness myopathy   . Chronic pain syndrome   . Internal jugular (IJ) vein thromboembolism, acute, unspecified laterality (HCC)       1. Acute on chronic respiratory failure hypoxia plan is to continue with full support on pressure control mode patient is not able to wean at this time is not been tolerating any weaning attempts 2. Septic shock resolved 3. Altered mental state at baseline 4. Critical illness myopathy therapy  as tolerated 5. Chronic pain controlled 6. IJ thrombosis treated improving   I have personally evaluated the patient, evaluated the laboratory and imaging results and formulated the assessment and plan and placed orders as needed. The Patient requires high complexity decision making with multiple system involvement. Rounds were done with the Respiratory Therapy Director and respiratory therapist involved in the care of the patient as well as nursing staff.   Yevonne Pax, MD [facility] Pulmonary Critical Care Medicine   This note is for inpatient care

## 2019-11-20 ENCOUNTER — Other Ambulatory Visit (HOSPITAL_COMMUNITY): Payer: Medicare Other | Admitting: Internal Medicine

## 2019-11-20 DIAGNOSIS — G894 Chronic pain syndrome: Secondary | ICD-10-CM

## 2019-11-20 DIAGNOSIS — R4182 Altered mental status, unspecified: Secondary | ICD-10-CM

## 2019-11-20 DIAGNOSIS — I82C19 Acute embolism and thrombosis of unspecified internal jugular vein: Secondary | ICD-10-CM

## 2019-11-20 DIAGNOSIS — J9621 Acute and chronic respiratory failure with hypoxia: Secondary | ICD-10-CM

## 2019-11-20 DIAGNOSIS — A419 Sepsis, unspecified organism: Secondary | ICD-10-CM

## 2019-11-20 DIAGNOSIS — G7281 Critical illness myopathy: Secondary | ICD-10-CM

## 2019-11-20 DIAGNOSIS — R6521 Severe sepsis with septic shock: Secondary | ICD-10-CM

## 2019-11-20 NOTE — Progress Notes (Signed)
[facility]  PROGRESS NOTE  PULMONARY SERVICE ROUNDS  Date of Service: 11/20/2019  Andrew Rivera  DOB: 1951-07-17  Referring physician: Larena Glassman, MD  HPI: Andrew Rivera is a 69 y.o. male  being seen for Acute on Chronic Respiratory Failure.  Patient currently is on full support on pressure control mode has been on 40% FiO2 good saturations are noted at this time.  He is still not able to tolerate spontaneous breathing secondary to the weakness of his bilateral diaphragm  Review of Systems: Unremarkable other than noted in HPI  Allergies:  Reviewed on the [facility]  Medications: Reviewed  Vitals: Temperature 98.9 pulse 54 respiratory 28 blood pressure is 110/60 saturations 100%  Ventilator Settings: Mode of ventilation pressure assist control FiO2 40% tidal volume 603 inspiratory pressure 26 PEEP 7  Physical Exam: . General:  calm and comfortable NAD . Eyes: normal lids, irises & conjunctiva . ENT: grossly normal tongue not enlarged . Neck: no masses . Cardiovascular: S1 S2 Normal no rubs no gallop . Respiratory: Scattered rhonchi coarse breath sounds are noted . Abdomen: soft non-distended . Skin: no rash seen on limited exam . Musculoskeletal:  no rigidity . Psychiatric: unable to assess . Neurologic: no involuntary movements          Lab Data and radiological Data:  No labs today   Assessment/Plan  Patient Active Problem List   Diagnosis Date Noted  . Acute on chronic respiratory failure with hypoxia (HCC)   . Septic shock (HCC)   . Altered mental status, unspecified   . Critical illness myopathy   . Chronic pain syndrome   . Internal jugular (IJ) vein thromboembolism, acute, unspecified laterality (HCC)       1. Acute on chronic respiratory failure hypoxia plan is to continue with full vent support patient has not been tolerating weaning 2. Sepsis with shock hemodynamics right now are stable 3. Altered mental status no change awake and  alert 4. Critical illness myopathy therapy as tolerated 5. Chronic pain syndrome controlled 6. IJ thrombosis treated we will continue with supportive care   I have personally evaluated the patient, evaluated the laboratory and imaging results and formulated the assessment and plan and placed orders as needed. The Patient requires high complexity decision making with multiple system involvement. Rounds were done with the Respiratory Therapy Director and respiratory therapist involved in the care of the patient as well as nursing staff.  Time 35 minutes   Yevonne Pax, MD [facility] Pulmonary Critical Care Medicine   This note is for inpatient care

## 2019-11-21 ENCOUNTER — Other Ambulatory Visit (HOSPITAL_COMMUNITY): Payer: Medicare Other | Admitting: Internal Medicine

## 2019-11-21 DIAGNOSIS — R4182 Altered mental status, unspecified: Secondary | ICD-10-CM

## 2019-11-21 DIAGNOSIS — J9621 Acute and chronic respiratory failure with hypoxia: Secondary | ICD-10-CM

## 2019-11-21 DIAGNOSIS — A419 Sepsis, unspecified organism: Secondary | ICD-10-CM

## 2019-11-21 DIAGNOSIS — I82C19 Acute embolism and thrombosis of unspecified internal jugular vein: Secondary | ICD-10-CM

## 2019-11-21 DIAGNOSIS — R6521 Severe sepsis with septic shock: Secondary | ICD-10-CM

## 2019-11-21 DIAGNOSIS — G894 Chronic pain syndrome: Secondary | ICD-10-CM

## 2019-11-21 DIAGNOSIS — G7281 Critical illness myopathy: Secondary | ICD-10-CM

## 2019-11-21 NOTE — Progress Notes (Signed)
[facility]  PROGRESS NOTE  PULMONARY SERVICE ROUNDS  Date of Service: 11/21/2019  Mitesh Rosendahl  DOB: [birth year]  Referring physician: Larena Glassman, MD  HPI: Andrew Rivera is a 69 y.o. male  being seen for Acute on Chronic Respiratory Failure.  Patient is on full support and pressure control mode right now on 40% FiO2 good saturations are noted  Review of Systems: Unremarkable other than noted in HPI  Allergies:  Reviewed on the [facility]  Medications: Reviewed  Vitals: Temperature is 96.5 pulse 58 respiratory 28 blood pressure is 110/62 saturations 96%  Ventilator Settings: Mode of ventilation pressure assist control FiO2 40% tidal line 535 PEEP 7  Physical Exam: . General:  calm and comfortable NAD . Eyes: normal lids, irises & conjunctiva . ENT: grossly normal tongue not enlarged . Neck: no masses . Cardiovascular: S1 S2 Normal no rubs no gallop . Respiratory: Scattered rhonchi expansion is equal at this time . Abdomen: soft non-distended . Skin: no rash seen on limited exam . Musculoskeletal:  no rigidity . Psychiatric: unable to assess . Neurologic: no involuntary movements          Lab Data and radiological Data:  Sodium 135 potassium 3.5 BUN 48 creatinine 0.6 White count 7.1 hemoglobin 7.5 hematocrit 22.9 platelet count 363   Assessment/Plan  Patient Active Problem List   Diagnosis Date Noted  . Acute on chronic respiratory failure with hypoxia (HCC)   . Septic shock (HCC)   . Altered mental status, unspecified   . Critical illness myopathy   . Chronic pain syndrome   . Internal jugular (IJ) vein thromboembolism, acute, unspecified laterality (HCC)       1. Acute on chronic respiratory failure with hypoxia patient is on full support on the ventilator on 40% FiO2 plan is going to be continue to assess weaning readiness which the patient has been failing 2. Sepsis with shock resolved 3. Altered mental status no change 4. Critical illness  myopathy no change supportive care 5. Chronic pain controlled 6. IJ thrombosis treated we will continue to follow   I have personally evaluated the patient, evaluated the laboratory and imaging results and formulated the assessment and plan and placed orders as needed. The Patient requires high complexity decision making with multiple system involvement. Rounds were done with the Respiratory Therapy Director and respiratory therapist involved in the care of the patient as well as nursing staff.   Yevonne Pax, MD [facility] Pulmonary Critical Care Medicine   This note is for inpatient care

## 2019-11-22 ENCOUNTER — Other Ambulatory Visit (HOSPITAL_COMMUNITY): Payer: Medicare Other | Admitting: Internal Medicine

## 2019-11-22 DIAGNOSIS — J9621 Acute and chronic respiratory failure with hypoxia: Secondary | ICD-10-CM

## 2019-11-22 DIAGNOSIS — R4182 Altered mental status, unspecified: Secondary | ICD-10-CM

## 2019-11-22 DIAGNOSIS — G7281 Critical illness myopathy: Secondary | ICD-10-CM

## 2019-11-22 DIAGNOSIS — I82C19 Acute embolism and thrombosis of unspecified internal jugular vein: Secondary | ICD-10-CM

## 2019-11-22 DIAGNOSIS — A419 Sepsis, unspecified organism: Secondary | ICD-10-CM

## 2019-11-22 DIAGNOSIS — R6521 Severe sepsis with septic shock: Secondary | ICD-10-CM

## 2019-11-22 DIAGNOSIS — G894 Chronic pain syndrome: Secondary | ICD-10-CM

## 2019-11-22 NOTE — Progress Notes (Signed)
[facility]  PROGRESS NOTE  PULMONARY SERVICE ROUNDS  Date of Service: 11/22/2019  Andrew Rivera  DOB: 06-19-1951  Referring physician: Larena Glassman, MD  HPI: Andrew Rivera is a 69 y.o. male  being seen for Acute on Chronic Respiratory Failure.  Patient currently is on full support on pressure control mode has been on 40% FiO2 on a PEEP of 7  Review of Systems: Unremarkable other than noted in HPI  Allergies:  Reviewed on the [facility]  Medications: Reviewed  Vitals: Temperature 95.8 pulse 62 respiratory 22 blood pressure is 110/62 saturations 99%  Ventilator Settings: Mode of ventilation pressure assist control FiO2 40% PEEP 7  Physical Exam: . General:  calm and comfortable NAD . Eyes: normal lids, irises & conjunctiva . ENT: grossly normal tongue not enlarged . Neck: no masses . Cardiovascular: S1 S2 Normal no rubs no gallop . Respiratory: Scattered rhonchi noted bilaterally . Abdomen: soft non-distended . Skin: no rash seen on limited exam . Musculoskeletal:  no rigidity . Psychiatric: unable to assess . Neurologic: no involuntary movements          Lab Data and radiological Data:  Data reviewed   Assessment/Plan  Patient Active Problem List   Diagnosis Date Noted  . Acute on chronic respiratory failure with hypoxia (HCC)   . Septic shock (HCC)   . Altered mental status, unspecified   . Critical illness myopathy   . Chronic pain syndrome   . Internal jugular (IJ) vein thromboembolism, acute, unspecified laterality (HCC)       1. Acute on chronic respiratory failure with hypoxia plan is to continue with full support on pressure control mode patient's been requiring 40% FiO2 not tolerating weaning 2. Sepsis with shock resolved 3. Critical illness myopathy at baseline 4. Chronic pain syndrome controlled 5.  internal jugular thrombosis we will continue with supportive care   I have personally evaluated the patient, evaluated the laboratory and  imaging results and formulated the assessment and plan and placed orders as needed. The Patient requires high complexity decision making with multiple system involvement. Rounds were done with the Respiratory Therapy Director and respiratory therapist involved in the care of the patient as well as nursing staff.   Yevonne Pax, MD [facility] Pulmonary Critical Care Medicine   This note is for inpatient care

## 2019-11-23 ENCOUNTER — Other Ambulatory Visit (HOSPITAL_COMMUNITY): Payer: Medicare Other | Admitting: Internal Medicine

## 2019-11-23 DIAGNOSIS — G894 Chronic pain syndrome: Secondary | ICD-10-CM

## 2019-11-23 DIAGNOSIS — A419 Sepsis, unspecified organism: Secondary | ICD-10-CM

## 2019-11-23 DIAGNOSIS — R6521 Severe sepsis with septic shock: Secondary | ICD-10-CM

## 2019-11-23 DIAGNOSIS — J9621 Acute and chronic respiratory failure with hypoxia: Secondary | ICD-10-CM

## 2019-11-23 DIAGNOSIS — I82C19 Acute embolism and thrombosis of unspecified internal jugular vein: Secondary | ICD-10-CM

## 2019-11-23 DIAGNOSIS — G7281 Critical illness myopathy: Secondary | ICD-10-CM

## 2019-11-23 DIAGNOSIS — R4182 Altered mental status, unspecified: Secondary | ICD-10-CM

## 2019-11-23 NOTE — Progress Notes (Signed)
[facility]  PROGRESS NOTE  PULMONARY SERVICE ROUNDS  Date of Service: 11/23/2019  Andrew Rivera  DOB: 11-06-1950  Referring physician: Larena Glassman, MD  HPI: Andrew Rivera is a 69 y.o. male  being seen for Acute on Chronic Respiratory Failure.  This morning patient not tolerating attempts at decreasing ventilatory support.  Remains on the ventilator right now and pressure control mode  Review of Systems: Unremarkable other than noted in HPI  Allergies:  Reviewed on the [facility]  Medications: Reviewed  Vitals: Temperature 97.4 pulse 74 respiratory rate 22 blood pressure is 122/60 saturations 97%  Ventilator Settings: Mode of ventilation pressure assist control FiO2 45% tidal line 622 PEEP 5 inspiratory pressure 28  Physical Exam: . General:  calm and comfortable NAD . Eyes: normal lids, irises & conjunctiva . ENT: grossly normal tongue not enlarged . Neck: no masses . Cardiovascular: S1 S2 Normal no rubs no gallop . Respiratory: Coarse breath sounds some rhonchi are noted . Abdomen: soft non-distended . Skin: no rash seen on limited exam . Musculoskeletal:  no rigidity . Psychiatric: unable to assess . Neurologic: no involuntary movements          Lab Data and radiological Data:  pH 7.31 PCO2 51 PO2 64   Assessment/Plan  Patient Active Problem List   Diagnosis Date Noted  . Acute on chronic respiratory failure with hypoxia (HCC)   . Septic shock (HCC)   . Altered mental status, unspecified   . Critical illness myopathy   . Chronic pain syndrome   . Internal jugular (IJ) vein thromboembolism, acute, unspecified laterality (HCC)       1. Acute on chronic respiratory failure with hypoxia plan is to continue with full support on the ventilator patient has not been tolerating any weaning attempts. 2. Sepsis with shock hemodynamics are stable 3. Altered mental status no change 4. Critical illness myopathy patient is at baseline 5. Chronic pain  syndrome continue with supportive care 6. IJ thrombosis treated we will continue to follow   I have personally evaluated the patient, evaluated the laboratory and imaging results and formulated the assessment and plan and placed orders as needed. The Patient requires high complexity decision making with multiple system involvement. Rounds were done with the Respiratory Therapy Director and respiratory therapist involved in the care of the patient as well as nursing staff.   Yevonne Pax, MD [facility] Pulmonary Critical Care Medicine   This note is for inpatient care

## 2019-11-24 ENCOUNTER — Other Ambulatory Visit (HOSPITAL_COMMUNITY): Payer: Medicare Other | Admitting: Internal Medicine

## 2019-11-24 DIAGNOSIS — G7281 Critical illness myopathy: Secondary | ICD-10-CM

## 2019-11-24 DIAGNOSIS — R6521 Severe sepsis with septic shock: Secondary | ICD-10-CM

## 2019-11-24 DIAGNOSIS — I82C19 Acute embolism and thrombosis of unspecified internal jugular vein: Secondary | ICD-10-CM

## 2019-11-24 DIAGNOSIS — R4182 Altered mental status, unspecified: Secondary | ICD-10-CM

## 2019-11-24 DIAGNOSIS — G894 Chronic pain syndrome: Secondary | ICD-10-CM

## 2019-11-24 DIAGNOSIS — J9621 Acute and chronic respiratory failure with hypoxia: Secondary | ICD-10-CM

## 2019-11-24 DIAGNOSIS — A419 Sepsis, unspecified organism: Secondary | ICD-10-CM

## 2019-11-24 DIAGNOSIS — I2699 Other pulmonary embolism without acute cor pulmonale: Secondary | ICD-10-CM

## 2019-11-24 NOTE — Progress Notes (Signed)
[place] NOTE  PULMONARY SERVICE ROUNDS  Date of Service: 11/24/2019  Andrew Rivera  DOB: 03/18/1951  Referring physician: Deanne Coffer, MD  HPI: Andrew Rivera is a 69 y.o. male  being seen for Acute on Chronic Respiratory Failure.  Patient currently is on full support on the ventilator apparently had a CT scan ordered because of increased work of breathing and this revealed small pulmonary emboli.  He is now on 40% oxygen with a PEEP of 5  Review of Systems: Unremarkable other than noted in HPI  Allergies:  Reviewed on the [facility]  Medications: Reviewed  Vitals: Temperature 96.9 pulse 67 respiratory rate 22 blood pressure is 100/60 saturations 100%  Ventilator Settings: Mode of ventilation pressure control FiO2 is 40% inspiratory pressure 28 tidal volume 481 PEEP 5  Physical Exam: . General:  calm and comfortable NAD . Eyes: normal lids, irises & conjunctiva . ENT: grossly normal tongue not enlarged . Neck: no masses . Cardiovascular: S1 S2 Normal no rubs no gallop . Respiratory: No rhonchi no rales are noted at this time . Abdomen: soft non-distended . Skin: no rash seen on limited exam . Musculoskeletal:  no rigidity . Psychiatric: unable to assess . Neurologic: no involuntary movements          Lab Data and radiological Data:  Sodium 138 potassium 3.4 BUN 33 creatinine 0.5 White count 7 hemoglobin 7 hematocrit 21.8 platelet count 361  Exam: Pulmonary Artery CT  Indication: Shortness of breath, dyspnea  Technique: Serial spiral axial images of the pulmonary vasculature were obtained during intravenous contrast administration. Post-processing images were obtained via workstation manipulation to include 3D MIP images.  Note: Dose reduction was obtained with Automatic Exposure Control (AEC) or, if AEC could not be utilized, by manual adjustment of the mA and/or kV according to patient size.  Contrast Dose: 100 mL Isovue-370  IV.  FINDINGS:   Chest wall: Right-sided central venous catheter. No soft tissue masses or other abnormalities. No axillary adenopathy seen. Status post cervical fusion. Mediastinum: No masses or adenopathy. Heart size is mildly enlarged. No aortic aneurysm or dissection. Vascular structures appear unremarkable. Hila: No masses or adenopathy. Pulmonary Arteries: As seen on axial images 38-40 and oblique coronal image 57 there is a small intraluminal filling defect in the left upper lobe segmental pulmonary artery branch, raising suspicion of a tiny embolus. Otherwise no evidence for pulmonary embolism.. Trachea and bronchi; Pulmonary parenchyma and Pleura: Tracheostomy in place. Some retained secretions seen in the dependent portion of the distal trachea and bronchi. Moderate bilateral pleural effusion. Moderate atelectasis of both lower lobes with air. Some dependent atelectatic changes of the upper lobes are present.   Upper abdomen: Prior thoracic spine hardware removal. Simple cyst in the liver. Otherwise upper abdominal scans are unremarkable.Marland Kitchen  IMPRESSION:  1. Tiny embolus in the left upper lobe segmental branch. 2. Moderate bilateral pleural effusion and atelectasis.  Assessment/Plan  Patient Active Problem List   Diagnosis Date Noted  . Acute on chronic respiratory failure with hypoxia ([place])   . Septic shock ([place])   . Altered mental status, unspecified   . Critical illness myopathy   . Chronic pain syndrome   . Internal jugular (IJ) vein thromboembolism, acute, unspecified laterality ([place])       1. Acute on chronic respiratory failure with hypoxia plan is to continue with the ventilator on full support currently on 40% FiO2.  With the new finding of small pulmonary emboli it may be  affecting his oxygenation to some extent patient should be anticoagulated 2. Sepsis with shock resolved 3. Pulmonary embolism as is as above these are tiny pulmonary emboli in the upper  lobe segmental branch not likely enough to cause significant hemodynamic compromise or oxygenation issues but we need to monitor nonetheless closely 4. Critical illness myopathy 5. Chronic pain controlled 6. IJ thromboembolism on anticoagulation   I have personally evaluated the patient, evaluated the laboratory and imaging results and formulated the assessment and plan and placed orders as needed. The Patient requires high complexity decision making with multiple system involvement. Rounds were done with the Respiratory Therapy Director and respiratory therapist involved in the care of the patient as well as nursing staff.  Time 35 minutes   Yevonne Pax, MD [facility] Pulmonary Critical Care Medicine   This note is for inpatient care

## 2019-11-25 ENCOUNTER — Other Ambulatory Visit (HOSPITAL_COMMUNITY): Payer: Medicare Other | Admitting: Internal Medicine

## 2019-11-25 DIAGNOSIS — G894 Chronic pain syndrome: Secondary | ICD-10-CM

## 2019-11-25 DIAGNOSIS — G7281 Critical illness myopathy: Secondary | ICD-10-CM

## 2019-11-25 DIAGNOSIS — A419 Sepsis, unspecified organism: Secondary | ICD-10-CM

## 2019-11-25 DIAGNOSIS — R6521 Severe sepsis with septic shock: Secondary | ICD-10-CM

## 2019-11-25 DIAGNOSIS — I82C19 Acute embolism and thrombosis of unspecified internal jugular vein: Secondary | ICD-10-CM

## 2019-11-25 DIAGNOSIS — R4182 Altered mental status, unspecified: Secondary | ICD-10-CM

## 2019-11-25 DIAGNOSIS — J9621 Acute and chronic respiratory failure with hypoxia: Secondary | ICD-10-CM

## 2019-11-25 NOTE — Progress Notes (Signed)
[facility]  PROGRESS NOTE  PULMONARY SERVICE ROUNDS  Date of Service: 11/25/2019  Andrew Rivera  DOB: 1951-09-30  Referring physician: Larena Glassman, MD  HPI: Andrew Rivera is a 69 y.o. male  being seen for Acute on Chronic Respiratory Failure.  Patient is currently on the ventilator has not been tolerating weaning at all  Review of Systems: Unremarkable other than noted in HPI  Allergies:  Reviewed on the [facility]  Medications: Reviewed  Vitals: Temperature is 97.7 pulse 72 respiratory rate 22 blood pressure is 127/53 saturations 100%  Ventilator Settings: On pressure assist control FiO2 is 40% respiratory pressure 20 PEEP 5  Physical Exam: . General:  calm and comfortable NAD . Eyes: normal lids, irises & conjunctiva . ENT: grossly normal tongue not enlarged . Neck: no masses . Cardiovascular: S1 S2 Normal no rubs no gallop . Respiratory: No rhonchi coarse breath sounds . Abdomen: soft non-distended . Skin: no rash seen on limited exam . Musculoskeletal:  no rigidity . Psychiatric: unable to assess . Neurologic: no involuntary movements          Lab Data and radiological Data:  Data reviewed   Assessment/Plan  Patient Active Problem List   Diagnosis Date Noted  . Acute on chronic respiratory failure with hypoxia (HCC)   . Septic shock (HCC)   . Altered mental status, unspecified   . Critical illness myopathy   . Chronic pain syndrome   . Internal jugular (IJ) vein thromboembolism, acute, unspecified laterality (HCC)       1. Acute on chronic respiratory failure with hypoxia plan is to continue with the ventilator patient's not tolerating weaning continue with supportive care 2. Sepsis with shock resolved 3. Altered mental status no change 4. Critical illness myopathy therapy as tolerated 5. Chronic pain controlled 6. IJ thrombosis has been anticoagulated   I have personally evaluated the patient, evaluated the laboratory and imaging results  and formulated the assessment and plan and placed orders as needed. The Patient requires high complexity decision making with multiple system involvement. Rounds were done with the Respiratory Therapy Director and respiratory therapist involved in the care of the patient as well as nursing staff.   Yevonne Pax, MD [facility] Pulmonary Critical Care Medicine   This note is for inpatient care

## 2019-11-26 ENCOUNTER — Other Ambulatory Visit (HOSPITAL_COMMUNITY): Payer: Medicare Other | Admitting: Internal Medicine

## 2019-11-26 DIAGNOSIS — J9621 Acute and chronic respiratory failure with hypoxia: Secondary | ICD-10-CM

## 2019-11-26 DIAGNOSIS — A419 Sepsis, unspecified organism: Secondary | ICD-10-CM

## 2019-11-26 DIAGNOSIS — I82C19 Acute embolism and thrombosis of unspecified internal jugular vein: Secondary | ICD-10-CM

## 2019-11-26 DIAGNOSIS — G7281 Critical illness myopathy: Secondary | ICD-10-CM

## 2019-11-26 DIAGNOSIS — R4182 Altered mental status, unspecified: Secondary | ICD-10-CM

## 2019-11-26 DIAGNOSIS — R6521 Severe sepsis with septic shock: Secondary | ICD-10-CM

## 2019-11-26 DIAGNOSIS — G894 Chronic pain syndrome: Secondary | ICD-10-CM

## 2019-11-26 NOTE — Progress Notes (Signed)
[facility]  PROGRESS NOTE  PULMONARY SERVICE ROUNDS  Date of Service: 11/26/2019  Andrew Rivera  DOB: 10-11-1951  Referring physician: Larena Glassman, MD  HPI: Andrew Rivera is a 69 y.o. male  being seen for Acute on Chronic Respiratory Failure.  Patient currently is on pressure control mode has been on 35% FiO2 patient's oxygenation does seem to be a little bit better today  Review of Systems: Unremarkable other than noted in HPI  Allergies:  Reviewed on the [facility]  Medications: Reviewed  Vitals: Temperature is 98.0 pulse 58 respiratory 26 blood pressure is 130/70 saturations 100%  Ventilator Settings: Mode of ventilation pressure assist control FiO2 35% tidal line 527 IP 16 PEEP 5  Physical Exam: . General:  calm and comfortable NAD . Eyes: normal lids, irises & conjunctiva . ENT: grossly normal tongue not enlarged . Neck: no masses . Cardiovascular: S1 S2 Normal no rubs no gallop . Respiratory: No rhonchi coarse breath sounds are noted . Abdomen: soft non-distended . Skin: no rash seen on limited exam . Musculoskeletal:  no rigidity . Psychiatric: unable to assess . Neurologic: no involuntary movements          Lab Data and radiological Data:  No labs today   Assessment/Plan  Patient Active Problem List   Diagnosis Date Noted  . Acute on chronic respiratory failure with hypoxia (HCC)   . Septic shock (HCC)   . Altered mental status, unspecified   . Critical illness myopathy   . Chronic pain syndrome   . Internal jugular (IJ) vein thromboembolism, acute, unspecified laterality (HCC)       1. Acute on chronic respiratory failure hypoxia plan is to continue with pressure control mode right now is on 35% FiO2 patient has not been tolerating weaning. 2. Severe sepsis with shock hemodynamics are stable 3. Altered mental status no change 4. Critical illness myopathy patient is at baseline 5. Chronic pain syndrome no change 6. IJ thromboembolism  treated we will continue with supportive care   I have personally evaluated the patient, evaluated the laboratory and imaging results and formulated the assessment and plan and placed orders as needed. The Patient requires high complexity decision making with multiple system involvement. Rounds were done with the Respiratory Therapy Director and respiratory therapist involved in the care of the patient as well as nursing staff.   Andrew Pax, MD [facility] Pulmonary Critical Care Medicine   This note is for inpatient care

## 2019-11-27 ENCOUNTER — Other Ambulatory Visit (HOSPITAL_COMMUNITY): Payer: Medicare Other | Admitting: Internal Medicine

## 2019-11-27 DIAGNOSIS — G7281 Critical illness myopathy: Secondary | ICD-10-CM

## 2019-11-27 DIAGNOSIS — R6521 Severe sepsis with septic shock: Secondary | ICD-10-CM

## 2019-11-27 DIAGNOSIS — G894 Chronic pain syndrome: Secondary | ICD-10-CM

## 2019-11-27 DIAGNOSIS — J9621 Acute and chronic respiratory failure with hypoxia: Secondary | ICD-10-CM

## 2019-11-27 DIAGNOSIS — I82C19 Acute embolism and thrombosis of unspecified internal jugular vein: Secondary | ICD-10-CM

## 2019-11-27 DIAGNOSIS — A419 Sepsis, unspecified organism: Secondary | ICD-10-CM

## 2019-11-27 DIAGNOSIS — R4182 Altered mental status, unspecified: Secondary | ICD-10-CM

## 2019-11-27 NOTE — Progress Notes (Signed)
[facility]  PROGRESS NOTE  PULMONARY SERVICE ROUNDS  Date of Service: 11/27/2019  Andrew Rivera  DOB: 10/16/1951  Referring physician: Larena Glassman, MD  HPI: Andrew Rivera is a 68 y.o. male  being seen for Acute on Chronic Respiratory Failure.  Patient is on full support on pressure control mode currently on 35% FiO2 he has an inspiratory pressure of 14 with a PEEP of 5  Review of Systems: Unremarkable other than noted in HPI  Allergies:  Reviewed on the [facility]  Medications: Reviewed  Vitals: Temperature 96.9 pulse 65 respiratory rate 16 blood pressure is 112/62 saturations 98%  Ventilator Settings: Mode ventilation pressure assist control FiO2 35% inspiratory pressure 14 PEEP 5  Physical Exam: . General:  calm and comfortable NAD . Eyes: normal lids, irises & conjunctiva . ENT: grossly normal tongue not enlarged . Neck: no masses . Cardiovascular: S1 S2 Normal no rubs no gallop . Respiratory: No rhonchi no rales are noted . Abdomen: soft non-distended . Skin: no rash seen on limited exam . Musculoskeletal:  no rigidity . Psychiatric: unable to assess . Neurologic: no involuntary movements          Lab Data and radiological Data:  Sodium 137 potassium 4.2 BUN 35 creatinine 0.4 White count 5.8 hemoglobin 8.8 hematocrit 28.9 platelet count 402   Assessment/Plan  Patient Active Problem List   Diagnosis Date Noted  . Acute on chronic respiratory failure with hypoxia (HCC)   . Septic shock (HCC)   . Altered mental status, unspecified   . Critical illness myopathy   . Chronic pain syndrome   . Internal jugular (IJ) vein thromboembolism, acute, unspecified laterality (HCC)       1. Acute on chronic respiratory failure with hypoxia plan is to continue on full support patient is not amenable 2. Septic shock resolved 3. Altered mental status at baseline 4. Critical illness myopathy no change supportive care 5. Chronic pain controlled 6. IJ thrombosis  treated   I have personally evaluated the patient, evaluated the laboratory and imaging results and formulated the assessment and plan and placed orders as needed. The Patient requires high complexity decision making with multiple system involvement. Rounds were done with the Respiratory Therapy Director and respiratory therapist involved in the care of the patient as well as nursing staff.   Yevonne Pax, MD [facility] Pulmonary Critical Care Medicine   This note is for inpatient care

## 2019-11-28 ENCOUNTER — Other Ambulatory Visit (HOSPITAL_COMMUNITY): Payer: Medicare Other | Admitting: Internal Medicine

## 2019-11-28 DIAGNOSIS — I82C19 Acute embolism and thrombosis of unspecified internal jugular vein: Secondary | ICD-10-CM

## 2019-11-28 DIAGNOSIS — A419 Sepsis, unspecified organism: Secondary | ICD-10-CM

## 2019-11-28 DIAGNOSIS — G894 Chronic pain syndrome: Secondary | ICD-10-CM

## 2019-11-28 DIAGNOSIS — R6521 Severe sepsis with septic shock: Secondary | ICD-10-CM

## 2019-11-28 DIAGNOSIS — J9621 Acute and chronic respiratory failure with hypoxia: Secondary | ICD-10-CM

## 2019-11-28 DIAGNOSIS — G7281 Critical illness myopathy: Secondary | ICD-10-CM

## 2019-11-28 DIAGNOSIS — R4182 Altered mental status, unspecified: Secondary | ICD-10-CM

## 2019-11-28 NOTE — Progress Notes (Signed)
[facility]  PROGRESS NOTE  PULMONARY SERVICE ROUNDS  Date of Service: 11/28/2019  Tennessee Perra  DOB: [birth year]  Referring physician: Larena Glassman, MD  HPI: Andrew Rivera is a 69 y.o. male  being seen for Acute on Chronic Respiratory Failure.  Patient has been on full support on the ventilator currently is on pressure control mode has been needing 35% FiO2 with good volumes  Review of Systems: Unremarkable other than noted in HPI  Allergies:  Reviewed on the [facility]  Medications: Reviewed  Vitals: Temperature 97.7 pulse 74 respiratory rate 14 blood pressure is 133/57 saturations 98%  Ventilator Settings: On pressure assist control FiO2 35% tidal volume 452 PEEP 5  Physical Exam: . General:  calm and comfortable NAD . Eyes: normal lids, irises & conjunctiva . ENT: grossly normal tongue not enlarged . Neck: no masses . Cardiovascular: S1 S2 Normal no rubs no gallop . Respiratory: Scattered rhonchi expansion is equal . Abdomen: soft non-distended . Skin: no rash seen on limited exam . Musculoskeletal:  no rigidity . Psychiatric: unable to assess . Neurologic: no involuntary movements          Lab Data and radiological Data:  Data has been reviewed   Assessment/Plan  Patient Active Problem List   Diagnosis Date Noted  . Acute on chronic respiratory failure with hypoxia (HCC)   . Septic shock (HCC)   . Altered mental status, unspecified   . Critical illness myopathy   . Chronic pain syndrome   . Internal jugular (IJ) vein thromboembolism, acute, unspecified laterality (HCC)       1. Acute on chronic respiratory failure hypoxia plan is to continue with pressure control mode at this time. 2. Sepsis with shock hemodynamics are stable 3. Altered mental status no change 4. Critical illness myopathy at baseline 5. Chronic pain controlled 6. IJ thrombosis supportive care   I have personally evaluated the patient, evaluated the laboratory and imaging  results and formulated the assessment and plan and placed orders as needed. The Patient requires high complexity decision making with multiple system involvement. Rounds were done with the Respiratory Therapy Director and respiratory therapist involved in the care of the patient as well as nursing staff.   Yevonne Pax, MD [facility] Pulmonary Critical Care Medicine   This note is for inpatient care

## 2019-11-29 ENCOUNTER — Other Ambulatory Visit (HOSPITAL_COMMUNITY): Payer: Medicare Other | Admitting: Internal Medicine

## 2019-11-29 DIAGNOSIS — J9621 Acute and chronic respiratory failure with hypoxia: Secondary | ICD-10-CM

## 2019-11-29 DIAGNOSIS — G894 Chronic pain syndrome: Secondary | ICD-10-CM

## 2019-11-29 DIAGNOSIS — I82C19 Acute embolism and thrombosis of unspecified internal jugular vein: Secondary | ICD-10-CM

## 2019-11-29 DIAGNOSIS — R4182 Altered mental status, unspecified: Secondary | ICD-10-CM

## 2019-11-29 DIAGNOSIS — G7281 Critical illness myopathy: Secondary | ICD-10-CM

## 2019-11-29 DIAGNOSIS — R6521 Severe sepsis with septic shock: Secondary | ICD-10-CM

## 2019-11-29 DIAGNOSIS — A419 Sepsis, unspecified organism: Secondary | ICD-10-CM

## 2019-11-29 NOTE — Progress Notes (Signed)
   PROGRESS NOTE  PULMONARY SERVICE ROUNDS  Date of Service: 11/29/2019  Weber Monnier  DOB: [birth year]  Referring physician: Larena Glassman, MD  HPI: Andrew Rivera is a 69 y.o. male  being seen for Acute on Chronic Respiratory Failure.  Patient currently is on full support has been on pressure control mode not tolerating any weaning  Review of Systems: Unremarkable other than noted in HPI  Allergies:  Reviewed on the [facility]  Medications: Reviewed  Vitals: Temperature is 97.3 pulse 64 respiratory 15 blood pressure is 120/70 saturations 99%  Ventilator Settings: Mode ventilation pressure assist control FiO2 35% tidal line 523 PEEP 5 inspiratory pressure 16  Physical Exam: . General:  calm and comfortable NAD . Eyes: normal lids, irises & conjunctiva . ENT: grossly normal tongue not enlarged . Neck: no masses . Cardiovascular: S1 S2 Normal no rubs no gallop . Respiratory: Coarse breath sounds with a few rhonchi . Abdomen: soft non-distended . Skin: no rash seen on limited exam . Musculoskeletal:  no rigidity . Psychiatric: unable to assess . Neurologic: no involuntary movements          Lab Data and radiological Data:  No labs today   Assessment/Plan  Patient Active Problem List   Diagnosis Date Noted  . Acute on chronic respiratory failure with hypoxia (HCC)   . Septic shock (HCC)   . Altered mental status, unspecified   . Critical illness myopathy   . Chronic pain syndrome   . Internal jugular (IJ) vein thromboembolism, acute, unspecified laterality (HCC)       1. Acute on chronic respiratory failure with hypoxia plan is to continue with the full support on the ventilator patient's not been tolerating weaning 2. Sepsis with shock resolved 3. Altered mental status no change at this time we will continue with present management 4. Critical illness myopathy slow to improve we will continue to follow 5. Chronic pain controlled 6. IJ thrombosis.  Continue with  supportive care   I have personally evaluated the patient, evaluated the laboratory and imaging results and formulated the assessment and plan and placed orders as needed. The Patient requires high complexity decision making with multiple system involvement. Rounds were done with the Respiratory Therapy Director and respiratory therapist involved in the care of the patient as well as nursing staff.   Yevonne Pax, MD [facility] Pulmonary Critical Care Medicine   This note is for inpatient care

## 2019-11-30 ENCOUNTER — Other Ambulatory Visit (HOSPITAL_COMMUNITY): Payer: Medicare Other | Admitting: Internal Medicine

## 2019-11-30 DIAGNOSIS — J9621 Acute and chronic respiratory failure with hypoxia: Secondary | ICD-10-CM

## 2019-11-30 DIAGNOSIS — I82C19 Acute embolism and thrombosis of unspecified internal jugular vein: Secondary | ICD-10-CM

## 2019-11-30 DIAGNOSIS — A419 Sepsis, unspecified organism: Secondary | ICD-10-CM

## 2019-11-30 DIAGNOSIS — R6521 Severe sepsis with septic shock: Secondary | ICD-10-CM

## 2019-11-30 DIAGNOSIS — G7281 Critical illness myopathy: Secondary | ICD-10-CM

## 2019-11-30 DIAGNOSIS — R4182 Altered mental status, unspecified: Secondary | ICD-10-CM

## 2019-11-30 DIAGNOSIS — G894 Chronic pain syndrome: Secondary | ICD-10-CM

## 2019-11-30 NOTE — Progress Notes (Signed)
[facility]  PROGRESS NOTE  PULMONARY SERVICE ROUNDS  Date of Service: 11/30/2019  Andrew Rivera  DOB: 25-Dec-1950  Referring physician: Larena Glassman, MD  HPI: Andrew Rivera is a 69 y.o. male  being seen for Acute on Chronic Respiratory Failure.  Patient remains on the ventilator on assist control mode not tolerating any attempts at weaning  Review of Systems: Unremarkable other than noted in HPI  Allergies:  Reviewed on the [facility]  Medications: Reviewed  Vitals: Temperature is 97.1 pulse 64 respiratory rate 14 blood pressure 100/60 saturations 98%  Ventilator Settings: Mode of ventilation pressure assist control FiO2 35% tidal volume 500 PEEP 5  Physical Exam: . General:  calm and comfortable NAD . Eyes: normal lids, irises & conjunctiva . ENT: grossly normal tongue not enlarged . Neck: no masses . Cardiovascular: S1 S2 Normal no rubs no gallop . Respiratory: Coarse rhonchi expansion is equal . Abdomen: soft non-distended . Skin: no rash seen on limited exam . Musculoskeletal:  no rigidity . Psychiatric: unable to assess . Neurologic: no involuntary movements          Lab Data and radiological Data:  No labs today   Assessment/Plan  Patient Active Problem List   Diagnosis Date Noted  . Acute on chronic respiratory failure with hypoxia (HCC)   . Septic shock (HCC)   . Altered mental status, unspecified   . Critical illness myopathy   . Chronic pain syndrome   . Internal jugular (IJ) vein thromboembolism, acute, unspecified laterality (HCC)       1. Acute on chronic respiratory failure with hypoxia patient will be continued on the ventilator and full support not a candidate for weaning. 2. Sepsis with shock hemodynamics are stable we will continue with present management 3. Altered mental state no change 4. Critical illness myopathy patient is at baseline 5. Chronic pain controlled 6. IJ thrombosis treated we will continue to follow   I have  personally evaluated the patient, evaluated the laboratory and imaging results and formulated the assessment and plan and placed orders as needed. The Patient requires high complexity decision making with multiple system involvement. Rounds were done with the Respiratory Therapy Director and respiratory therapist involved in the care of the patient as well as nursing staff.   Yevonne Pax, MD [facility] Pulmonary Critical Care Medicine   This note is for inpatient care

## 2019-12-01 ENCOUNTER — Other Ambulatory Visit (HOSPITAL_COMMUNITY): Payer: Medicare Other | Admitting: Internal Medicine

## 2019-12-01 DIAGNOSIS — R4182 Altered mental status, unspecified: Secondary | ICD-10-CM

## 2019-12-01 DIAGNOSIS — G894 Chronic pain syndrome: Secondary | ICD-10-CM

## 2019-12-01 DIAGNOSIS — G7281 Critical illness myopathy: Secondary | ICD-10-CM

## 2019-12-01 DIAGNOSIS — I82C19 Acute embolism and thrombosis of unspecified internal jugular vein: Secondary | ICD-10-CM

## 2019-12-01 DIAGNOSIS — R6521 Severe sepsis with septic shock: Secondary | ICD-10-CM

## 2019-12-01 DIAGNOSIS — A419 Sepsis, unspecified organism: Secondary | ICD-10-CM

## 2019-12-01 DIAGNOSIS — J9621 Acute and chronic respiratory failure with hypoxia: Secondary | ICD-10-CM

## 2019-12-01 NOTE — Progress Notes (Signed)
[facility]  PROGRESS NOTE  PULMONARY SERVICE ROUNDS  Date of Service: 12/01/2019  Andrew Rivera  DOB: 1951-08-05  Referring physician: Larena Glassman, MD  HPI: Andrew Rivera is a 69 y.o. male  being seen for Acute on Chronic Respiratory Failure.  Patient currently is on full support on the ventilator.  Is not been tolerating weaning attempts.  Review of Systems: Unremarkable other than noted in HPI  Allergies:  Reviewed on the [facility]  Medications: Reviewed  Vitals: Temperature 98.3 pulse 63 respiratory 17 blood pressure is 120/52 saturations 100%  Ventilator Settings: Mode of ventilation pressure assist control FiO2 35% tidal volume 513 IP 18 PEEP 5  Physical Exam: . General:  calm and comfortable NAD . Eyes: normal lids, irises & conjunctiva . ENT: grossly normal tongue not enlarged . Neck: no masses . Cardiovascular: S1 S2 Normal no rubs no gallop . Respiratory: No rhonchi coarse breath sounds . Abdomen: soft non-distended . Skin: no rash seen on limited exam . Musculoskeletal:  no rigidity . Psychiatric: unable to assess . Neurologic: no involuntary movements          Lab Data and radiological Data:   White count 6.9 hemoglobin 8.0 hematocrit 25.5 platelet count 294 Sodium 139 potassium 3.6 BUN 39 creatinine 0.5   Assessment/Plan  Patient Active Problem List   Diagnosis Date Noted  . Acute on chronic respiratory failure with hypoxia (HCC)   . Septic shock (HCC)   . Altered mental status, unspecified   . Critical illness myopathy   . Chronic pain syndrome   . Internal jugular (IJ) vein thromboembolism, acute, unspecified laterality (HCC)       1. Acute on chronic respiratory failure with hypoxia patient continues on full support on the ventilator not able to do any weaning on pressure control mode right now is on 35% FiO2 with an AP of 18 PEEP 5 we will continue with current management supportive care 2. Sepsis with shock resolved we will  continue to follow along 3. Altered mental status improved he seems to be at baseline now 4. Critical illness myopathy no change 5. Chronic pain syndrome at baseline 6. IJ thrombosis continue with supportive care   I have personally evaluated the patient, evaluated the laboratory and imaging results and formulated the assessment and plan and placed orders as needed. The Patient requires high complexity decision making with multiple system involvement. Rounds were done with the Respiratory Therapy Director and respiratory therapist involved in the care of the patient as well as nursing staff.   Yevonne Pax, MD [facility] Pulmonary Critical Care Medicine   This note is for inpatient care

## 2019-12-02 ENCOUNTER — Other Ambulatory Visit (HOSPITAL_COMMUNITY): Payer: Medicare Other | Admitting: Internal Medicine

## 2019-12-02 DIAGNOSIS — I82C19 Acute embolism and thrombosis of unspecified internal jugular vein: Secondary | ICD-10-CM

## 2019-12-02 DIAGNOSIS — G7281 Critical illness myopathy: Secondary | ICD-10-CM

## 2019-12-02 DIAGNOSIS — R4182 Altered mental status, unspecified: Secondary | ICD-10-CM

## 2019-12-02 DIAGNOSIS — R6521 Severe sepsis with septic shock: Secondary | ICD-10-CM

## 2019-12-02 DIAGNOSIS — J9621 Acute and chronic respiratory failure with hypoxia: Secondary | ICD-10-CM

## 2019-12-02 DIAGNOSIS — G894 Chronic pain syndrome: Secondary | ICD-10-CM

## 2019-12-02 DIAGNOSIS — A419 Sepsis, unspecified organism: Secondary | ICD-10-CM

## 2019-12-02 NOTE — Progress Notes (Signed)
[facility]  PROGRESS NOTE  PULMONARY SERVICE ROUNDS  Date of Service: 12/02/2019  Andrew Rivera  DOB: [birth year]  Referring physician: Larena Glassman, MD  HPI: Andrew Rivera is a 69 y.o. male  being seen for Acute on Chronic Respiratory Failure.  Patient is on pressure control mode has been on 35% FiO2 good saturations are noted at this time  Review of Systems: Unremarkable other than noted in HPI  Allergies:  Reviewed on the [facility]  Medications: Reviewed  Vitals: Temperature 96.7 pulse 60 respiratory 25 blood pressure is 120/70 saturations 100%  Ventilator Settings: Mode of ventilation pressure assist control FiO2 35% respiratory pressure 18 PEEP 5  Physical Exam: . General:  calm and comfortable NAD . Eyes: normal lids, irises & conjunctiva . ENT: grossly normal tongue not enlarged . Neck: no masses . Cardiovascular: S1 S2 Normal no rubs no gallop . Respiratory: Scattered rhonchi expansion is equal at this time . Abdomen: soft non-distended . Skin: no rash seen on limited exam . Musculoskeletal:  no rigidity . Psychiatric: unable to assess . Neurologic: no involuntary movements          Lab Data and radiological Data:  No labs to report   Assessment/Plan  Patient Active Problem List   Diagnosis Date Noted  . Acute on chronic respiratory failure with hypoxia (HCC)   . Septic shock (HCC)   . Altered mental status, unspecified   . Critical illness myopathy   . Chronic pain syndrome   . Internal jugular (IJ) vein thromboembolism, acute, unspecified laterality (HCC)       1. Acute on chronic respiratory failure with hypoxia patient remains on the ventilator and full support.  Also patient did have a swallowing assessment done today which the patient failed we will continue with full support on the ventilator 2. Altered mental status resolved 3. Septic shock resolved 4. Critical illness myopathy still significant weakness noted 5. Chronic pain  controlled IJ thrombosis treated we will continue with supportive care   I have personally evaluated the patient, evaluated the laboratory and imaging results and formulated the assessment and plan and placed orders as needed. The Patient requires high complexity decision making with multiple system involvement. Rounds were done with the Respiratory Therapy Director and respiratory therapist involved in the care of the patient as well as nursing staff.   Yevonne Pax, MD [facility] Pulmonary Critical Care Medicine   This note is for inpatient care

## 2019-12-03 ENCOUNTER — Other Ambulatory Visit (HOSPITAL_COMMUNITY): Payer: Medicare Other | Admitting: Internal Medicine

## 2019-12-03 DIAGNOSIS — I82C19 Acute embolism and thrombosis of unspecified internal jugular vein: Secondary | ICD-10-CM

## 2019-12-03 DIAGNOSIS — R4182 Altered mental status, unspecified: Secondary | ICD-10-CM

## 2019-12-03 DIAGNOSIS — A419 Sepsis, unspecified organism: Secondary | ICD-10-CM

## 2019-12-03 DIAGNOSIS — R6521 Severe sepsis with septic shock: Secondary | ICD-10-CM

## 2019-12-03 DIAGNOSIS — G894 Chronic pain syndrome: Secondary | ICD-10-CM

## 2019-12-03 DIAGNOSIS — J9621 Acute and chronic respiratory failure with hypoxia: Secondary | ICD-10-CM

## 2019-12-03 DIAGNOSIS — G7281 Critical illness myopathy: Secondary | ICD-10-CM

## 2019-12-03 NOTE — Progress Notes (Signed)
[facility]  PROGRESS NOTE  PULMONARY SERVICE ROUNDS  Date of Service: 12/03/2019  Andrew Rivera  DOB: 07-Dec-1950  Referring physician: Larena Glassman, MD  HPI: Andrew Rivera is a 69 y.o. male  being seen for Acute on Chronic Respiratory Failure.  Remains comfortable right now on full support on pressure control mode not able to tolerate any weaning  Review of Systems: Unremarkable other than noted in HPI  Allergies:  Reviewed on the [facility]  Medications: Reviewed  Vitals: Temperature 97.6 pulse 61 respiratory 18 blood pressure is 120/58 saturations 100%  Ventilator Settings: Ventilation pressure assist control FiO2 35% respiratory pressure 18 PEEP 5  Physical Exam: . General:  calm and comfortable NAD . Eyes: normal lids, irises & conjunctiva . ENT: grossly normal tongue not enlarged . Neck: no masses . Cardiovascular: S1 S2 Normal no rubs no gallop . Respiratory: Worse rhonchi expansion is equal . Abdomen: soft non-distended . Skin: no rash seen on limited exam . Musculoskeletal:  no rigidity . Psychiatric: unable to assess . Neurologic: no involuntary movements          Lab Data and radiological Data:  No labs today   Assessment/Plan  Patient Active Problem List   Diagnosis Date Noted  . Acute on chronic respiratory failure with hypoxia (HCC)   . Septic shock (HCC)   . Altered mental status, unspecified   . Critical illness myopathy   . Chronic pain syndrome   . Internal jugular (IJ) vein thromboembolism, acute, unspecified laterality (HCC)       1. Acute on chronic respiratory failure hypoxia patient not weaning well right now is on full support on pressure control on 35% FiO2 and story pressure is 18 with a PEEP of 5 plan is to continue with full support on the ventilator no weaning right now possible discharge 2. Sepsis with shock resolved 3. Altered mental state is improved 4. Critical illness myopathy no change supportive care therapy as  tolerated 5. Chronic pain controlled we will continue with present management 6. IJ thrombosis treated   I have personally evaluated the patient, evaluated the laboratory and imaging results and formulated the assessment and plan and placed orders as needed. The Patient requires high complexity decision making with multiple system involvement. Rounds were done with the Respiratory Therapy Director and respiratory therapist involved in the care of the patient as well as nursing staff.   Yevonne Pax, MD [facility] Pulmonary Critical Care Medicine   This note is for inpatient care

## 2019-12-04 ENCOUNTER — Other Ambulatory Visit (HOSPITAL_COMMUNITY): Payer: Medicare Other | Admitting: Internal Medicine

## 2019-12-04 DIAGNOSIS — J9621 Acute and chronic respiratory failure with hypoxia: Secondary | ICD-10-CM

## 2019-12-04 DIAGNOSIS — R4182 Altered mental status, unspecified: Secondary | ICD-10-CM

## 2019-12-04 DIAGNOSIS — G7281 Critical illness myopathy: Secondary | ICD-10-CM

## 2019-12-04 DIAGNOSIS — A419 Sepsis, unspecified organism: Secondary | ICD-10-CM

## 2019-12-04 DIAGNOSIS — I82C19 Acute embolism and thrombosis of unspecified internal jugular vein: Secondary | ICD-10-CM

## 2019-12-04 DIAGNOSIS — R6521 Severe sepsis with septic shock: Secondary | ICD-10-CM

## 2019-12-04 DIAGNOSIS — G894 Chronic pain syndrome: Secondary | ICD-10-CM

## 2019-12-04 NOTE — Progress Notes (Signed)
[facility]  PROGRESS NOTE  PULMONARY SERVICE ROUNDS  Date of Service: 12/04/2019  Andrew Rivera  DOB: 08-13-1951  Referring physician: Larena Glassman, MD  HPI: Andrew Rivera is a 69 y.o. male  being seen for Acute on Chronic Respiratory Failure.  Patient is on full support on pressure control mode currently is on 45% FiO2 comfortable right now without distress  Review of Systems: Unremarkable other than noted in HPI  Allergies:  Reviewed on the [facility]  Medications: Reviewed  Vitals: Temperature 97.7 pulse 62 respiratory rate 16 blood pressure is 103/44 saturations 99%  Ventilator Settings: On pressure assist control FiO2 35% tidal line 451 PEEP 5  Physical Exam: . General:  calm and comfortable NAD . Eyes: normal lids, irises & conjunctiva . ENT: grossly normal tongue not enlarged . Neck: no masses . Cardiovascular: S1 S2 Normal no rubs no gallop . Respiratory: Coarse breath sounds with a few rhonchi . Abdomen: soft non-distended . Skin: no rash seen on limited exam . Musculoskeletal:  no rigidity . Psychiatric: unable to assess . Neurologic: no involuntary movements          Lab Data and radiological Data:  Sodium 140 potassium 3.6 BUN 45 creatinine 0.5 White count 7.7 hemoglobin 9.1 hematocrit 28.5 platelet count 233   Assessment/Plan  Patient Active Problem List   Diagnosis Date Noted  . Acute on chronic respiratory failure with hypoxia (HCC)   . Septic shock (HCC)   . Altered mental status, unspecified   . Critical illness myopathy   . Chronic pain syndrome   . Internal jugular (IJ) vein thromboembolism, acute, unspecified laterality (HCC)       1. Acute on chronic respiratory failure with hypoxia plan is to continue with full support on the ventilator patient is not a candidate for weaning off mechanical ventilation. 2. Sepsis with shock resolved hemodynamics stable 3. Altered mental status improved 4. Critical illness myopathy no change  therapy as tolerated 5. Chronic pain at baseline 6. IJ thrombosis at baseline we will continue with supportive care   I have personally evaluated the patient, evaluated the laboratory and imaging results and formulated the assessment and plan and placed orders as needed. The Patient requires high complexity decision making with multiple system involvement. Rounds were done with the Respiratory Therapy Director and respiratory therapist involved in the care of the patient as well as nursing staff.   Yevonne Pax, MD [facility] Pulmonary Critical Care Medicine   This note is for inpatient care

## 2019-12-05 ENCOUNTER — Other Ambulatory Visit (HOSPITAL_COMMUNITY): Payer: Medicare Other | Admitting: Internal Medicine

## 2019-12-05 DIAGNOSIS — G894 Chronic pain syndrome: Secondary | ICD-10-CM

## 2019-12-05 DIAGNOSIS — G7281 Critical illness myopathy: Secondary | ICD-10-CM

## 2019-12-05 DIAGNOSIS — R6521 Severe sepsis with septic shock: Secondary | ICD-10-CM

## 2019-12-05 DIAGNOSIS — I82C19 Acute embolism and thrombosis of unspecified internal jugular vein: Secondary | ICD-10-CM

## 2019-12-05 DIAGNOSIS — A419 Sepsis, unspecified organism: Secondary | ICD-10-CM

## 2019-12-05 DIAGNOSIS — J9621 Acute and chronic respiratory failure with hypoxia: Secondary | ICD-10-CM

## 2019-12-05 DIAGNOSIS — R4182 Altered mental status, unspecified: Secondary | ICD-10-CM

## 2019-12-05 NOTE — Progress Notes (Signed)
[facility]  PROGRESS NOTE  PULMONARY SERVICE ROUNDS  Date of Service: 12/05/2019  Andrew Rivera  DOB: 1951-07-30  Referring physician: Larena Glassman, MD  HPI: Andrew Rivera is a 69 y.o. male  being seen for Acute on Chronic Respiratory Failure.  He remains on the ventilator right now is comfortable without distress at this time and is on 35% FiO2 with a tidal volume of 435  Review of Systems: Unremarkable other than noted in HPI  Allergies:  Reviewed on the [facility]  Medications: Reviewed  Vitals: Temperature is 97.3 pulse 81 respiratory 24 blood pressure is 127/58 saturations 100%  Ventilator Settings: Mode of ventilation pressure assist control FiO2 35% inspired pressure 18 PEEP 5 tidal volume is 435  Physical Exam: . General:  calm and comfortable NAD . Eyes: normal lids, irises & conjunctiva . ENT: grossly normal tongue not enlarged . Neck: no masses . Cardiovascular: S1 S2 Normal no rubs no gallop . Respiratory: Coarse rhonchi expansion is equal at this time . Abdomen: soft non-distended . Skin: no rash seen on limited exam . Musculoskeletal:  no rigidity . Psychiatric: unable to assess . Neurologic: no involuntary movements          Lab Data and radiological Data:  No labs today   Assessment/Plan  Patient Active Problem List   Diagnosis Date Noted  . Acute on chronic respiratory failure with hypoxia (HCC)   . Septic shock (HCC)   . Altered mental status, unspecified   . Critical illness myopathy   . Chronic pain syndrome   . Internal jugular (IJ) vein thromboembolism, acute, unspecified laterality (HCC)       1. Acute on chronic respiratory failure with hypoxia patient is currently on full support on pressure control mode has been on 35% FiO2 with a PEEP of 5 good volumes are noted at this time 2. Sepsis with shock hemodynamics are stable 3. Critical illness myopathy no change 4. Chronic pain syndrome controlled 5. IJ thrombosis treated we  will continue to follow 6. Altered mental state patient is at baseline now   I have personally evaluated the patient, evaluated the laboratory and imaging results and formulated the assessment and plan and placed orders as needed. The Patient requires high complexity decision making with multiple system involvement. Rounds were done with the Respiratory Therapy Director and respiratory therapist involved in the care of the patient as well as nursing staff.   Yevonne Pax, MD [facility] Pulmonary Critical Care Medicine   This note is for inpatient care

## 2019-12-06 ENCOUNTER — Other Ambulatory Visit (HOSPITAL_COMMUNITY): Payer: Medicare Other | Admitting: Internal Medicine

## 2019-12-06 DIAGNOSIS — R4182 Altered mental status, unspecified: Secondary | ICD-10-CM

## 2019-12-06 DIAGNOSIS — R6521 Severe sepsis with septic shock: Secondary | ICD-10-CM

## 2019-12-06 DIAGNOSIS — A419 Sepsis, unspecified organism: Secondary | ICD-10-CM

## 2019-12-06 DIAGNOSIS — J9621 Acute and chronic respiratory failure with hypoxia: Secondary | ICD-10-CM

## 2019-12-06 DIAGNOSIS — I82C19 Acute embolism and thrombosis of unspecified internal jugular vein: Secondary | ICD-10-CM

## 2019-12-06 DIAGNOSIS — G894 Chronic pain syndrome: Secondary | ICD-10-CM

## 2019-12-06 DIAGNOSIS — G7281 Critical illness myopathy: Secondary | ICD-10-CM

## 2019-12-06 NOTE — Progress Notes (Signed)
[facility]  PROGRESS NOTE  PULMONARY SERVICE ROUNDS  Date of Service: 12/06/2019  Andrew Rivera  DOB: Sep 16, 1951  Referring physician: Larena Glassman, MD  HPI: Andrew Rivera is a 69 y.o. male  being seen for Acute on Chronic Respiratory Failure.  Patient right now is on full support on pressure control mode has been on 35% FiO2 not able to do any weaning apparently the primary care team is trying to get a hold of neurosurgery to evaluate further for the weakness of the diaphragm.  I have explained to them that this is not likely going to resolve but they do want to explore this possibility.  Review of Systems: Unremarkable other than noted in HPI  Allergies:  Reviewed on the [facility]  Medications: Reviewed  Vitals: Temperature 98.0 pulse 60 respiratory rate 17 blood pressure is 110/70 saturations 100%  Ventilator Settings: Mode of ventilation pressure assist control FiO2 35% PEEP 18 PEEP 5  Physical Exam: . General:  calm and comfortable NAD . Eyes: normal lids, irises & conjunctiva . ENT: grossly normal tongue not enlarged . Neck: no masses . Cardiovascular: S1 S2 Normal no rubs no gallop . Respiratory: Coarse rhonchi expansion is equal . Abdomen: soft non-distended . Skin: no rash seen on limited exam . Musculoskeletal:  no rigidity . Psychiatric: unable to assess . Neurologic: no involuntary movements          Lab Data and radiological Data:  No labs to report   Assessment/Plan  Patient Active Problem List   Diagnosis Date Noted  . Acute on chronic respiratory failure with hypoxia (HCC)   . Septic shock (HCC)   . Altered mental status, unspecified   . Critical illness myopathy   . Chronic pain syndrome   . Internal jugular (IJ) vein thromboembolism, acute, unspecified laterality (HCC)       1. Acute on chronic respiratory failure with hypoxia continues on the ventilator and full support not a candidate for weaning we will continue to follow  closely. 2. Sepsis with shock resolved 3. Altered mental status patient is at his baseline 4. Critical illness myopathy no change 5. Chronic pain syndrome controlled 6. IJ thrombosis treated   I have personally evaluated the patient, evaluated the laboratory and imaging results and formulated the assessment and plan and placed orders as needed. The Patient requires high complexity decision making with multiple system involvement. Rounds were done with the Respiratory Therapy Director and respiratory therapist involved in the care of the patient as well as nursing staff.   Yevonne Pax, MD [facility] Pulmonary Critical Care Medicine   This note is for inpatient care

## 2019-12-13 ENCOUNTER — Other Ambulatory Visit (HOSPITAL_COMMUNITY): Payer: Medicare Other | Admitting: Internal Medicine

## 2019-12-13 DIAGNOSIS — J9621 Acute and chronic respiratory failure with hypoxia: Secondary | ICD-10-CM

## 2019-12-13 DIAGNOSIS — I82C19 Acute embolism and thrombosis of unspecified internal jugular vein: Secondary | ICD-10-CM

## 2019-12-13 DIAGNOSIS — R4182 Altered mental status, unspecified: Secondary | ICD-10-CM

## 2019-12-13 DIAGNOSIS — A419 Sepsis, unspecified organism: Secondary | ICD-10-CM

## 2019-12-13 DIAGNOSIS — G7281 Critical illness myopathy: Secondary | ICD-10-CM

## 2019-12-13 DIAGNOSIS — G894 Chronic pain syndrome: Secondary | ICD-10-CM

## 2019-12-13 DIAGNOSIS — R6521 Severe sepsis with septic shock: Secondary | ICD-10-CM

## 2019-12-13 NOTE — Progress Notes (Signed)
[facility]  PROGRESS NOTE  PULMONARY SERVICE ROUNDS  Date of Service: 12/13/2019  Andrew Rivera  DOB: Mar 06, 1951  Referring physician: Larena Glassman, MD  HPI: Andrew Rivera is a 69 y.o. male  being seen for Acute on Chronic Respiratory Failure.  Patient is on pressure support mode right now has been on 30% FiO2 on pressure support of 5/5.  This is apparently for muscular training to try to strengthen respiratory muscles  Review of Systems: Unremarkable other than noted in HPI  Allergies:  Reviewed on the [facility]  Medications: Reviewed  Vitals: Temperature is 98.1 pulse 61 respiratory rate 20 blood pressure is 108/64 saturations 99%  Ventilator Settings: Mode of ventilation pressure support FiO2 30% pressure support 5 PEEP 5 tidal volume is 435  Physical Exam: . General:  calm and comfortable NAD . Eyes: normal lids, irises & conjunctiva . ENT: grossly normal tongue not enlarged . Neck: no masses . Cardiovascular: S1 S2 Normal no rubs no gallop . Respiratory: No rhonchi no rales are noted at this time . Abdomen: soft non-distended . Skin: no rash seen on limited exam . Musculoskeletal:  no rigidity . Psychiatric: unable to assess . Neurologic: no involuntary movements          Lab Data and radiological Data:  No labs to report today   Assessment/Plan  Patient Active Problem List   Diagnosis Date Noted  . Acute on chronic respiratory failure with hypoxia (HCC)   . Septic shock (HCC)   . Altered mental status, unspecified   . Critical illness myopathy   . Chronic pain syndrome   . Internal jugular (IJ) vein thromboembolism, acute, unspecified laterality (HCC)       1. Acute on chronic respiratory failure with hypoxia plan is to continue to wean as tolerated patient's pressure is set at 5/5 he does fatigue fairly easily so we will need to monitor closely 2. Sepsis with shock hemodynamics are stable 3. Altered mental status at baseline 4. Critical  illness myopathy slow improvement 5. Chronic pain controlled 6. IJ thrombosis treated   I have personally evaluated the patient, evaluated the laboratory and imaging results and formulated the assessment and plan and placed orders as needed. The Patient requires high complexity decision making with multiple system involvement. Rounds were done with the Respiratory Therapy Director and respiratory therapist involved in the care of the patient as well as nursing staff.   Yevonne Pax, MD [facility] Pulmonary Critical Care Medicine   This note is for inpatient care

## 2019-12-14 ENCOUNTER — Other Ambulatory Visit (HOSPITAL_COMMUNITY): Payer: Medicare Other | Admitting: Internal Medicine

## 2019-12-14 DIAGNOSIS — R4182 Altered mental status, unspecified: Secondary | ICD-10-CM

## 2019-12-14 DIAGNOSIS — I82C19 Acute embolism and thrombosis of unspecified internal jugular vein: Secondary | ICD-10-CM

## 2019-12-14 DIAGNOSIS — R6521 Severe sepsis with septic shock: Secondary | ICD-10-CM

## 2019-12-14 DIAGNOSIS — J9621 Acute and chronic respiratory failure with hypoxia: Secondary | ICD-10-CM

## 2019-12-14 DIAGNOSIS — G7281 Critical illness myopathy: Secondary | ICD-10-CM

## 2019-12-14 DIAGNOSIS — G894 Chronic pain syndrome: Secondary | ICD-10-CM

## 2019-12-14 DIAGNOSIS — A419 Sepsis, unspecified organism: Secondary | ICD-10-CM

## 2019-12-14 NOTE — Progress Notes (Signed)
[facility]  PROGRESS NOTE  PULMONARY SERVICE ROUNDS  Date of Service: 12/14/2019  Andrew Rivera  DOB: 1951/03/17  Referring physician: Larena Glassman, MD  HPI: Andrew Rivera is a 69 y.o. male  being seen for Acute on Chronic Respiratory Failure.  Patient currently is on pressure support mode was attempted at a pressure support CPAP of 5/5 however he did not tolerate it.  Patient stated that he was feeling tired and fatigued placed back on pressure support of 12/5 with better volumes  Review of Systems: Unremarkable other than noted in HPI  Allergies:  Reviewed on the [facility]  Medications: Reviewed  Vitals: Temperature 97.1 pulse 60 respiratory rate 20 blood pressure is 118/58 saturations 100%  Ventilator Settings: Mode of ventilation pressure support FiO2 30% pressure support 12 PEEP 5  Physical Exam: . General:  calm and comfortable NAD . Eyes: normal lids, irises & conjunctiva . ENT: grossly normal tongue not enlarged . Neck: no masses . Cardiovascular: S1 S2 Normal no rubs no gallop . Respiratory: Coarse breath sounds with a few rhonchi . Abdomen: soft non-distended . Skin: no rash seen on limited exam . Musculoskeletal:  no rigidity . Psychiatric: unable to assess . Neurologic: no involuntary movements          Lab Data and radiological Data:  No labs to report today   Assessment/Plan  Patient Active Problem List   Diagnosis Date Noted  . Acute on chronic respiratory failure with hypoxia (HCC)   . Septic shock (HCC)   . Altered mental status, unspecified   . Critical illness myopathy   . Chronic pain syndrome   . Internal jugular (IJ) vein thromboembolism, acute, unspecified laterality (HCC)       1. Acute on chronic respiratory failure with hypoxia plan is to continue with pressure support currently is on 30% FiO2 good volumes of 500 not tolerating 5/5 as was previously ordered.  I would continue to assess daily but again I do not believe that  he is going to be able to be completely liberated from the ventilator. 2. Sepsis with shock resolved 3. Altered mental status resolved 4. Critical illness myopathy slow improvement 5. Chronic pain controlled 6. IJ thrombosis treated   I have personally evaluated the patient, evaluated the laboratory and imaging results and formulated the assessment and plan and placed orders as needed. The Patient requires high complexity decision making with multiple system involvement. Rounds were done with the Respiratory Therapy Director and respiratory therapist involved in the care of the patient as well as nursing staff.   Yevonne Pax, MD [facility] Pulmonary Critical Care Medicine   This note is for inpatient care

## 2019-12-15 ENCOUNTER — Other Ambulatory Visit (HOSPITAL_COMMUNITY): Payer: Medicare Other | Admitting: Internal Medicine

## 2019-12-15 DIAGNOSIS — G7281 Critical illness myopathy: Secondary | ICD-10-CM

## 2019-12-15 DIAGNOSIS — R6521 Severe sepsis with septic shock: Secondary | ICD-10-CM

## 2019-12-15 DIAGNOSIS — I82C19 Acute embolism and thrombosis of unspecified internal jugular vein: Secondary | ICD-10-CM

## 2019-12-15 DIAGNOSIS — A419 Sepsis, unspecified organism: Secondary | ICD-10-CM

## 2019-12-15 DIAGNOSIS — J9621 Acute and chronic respiratory failure with hypoxia: Secondary | ICD-10-CM

## 2019-12-15 DIAGNOSIS — R4182 Altered mental status, unspecified: Secondary | ICD-10-CM

## 2019-12-15 DIAGNOSIS — G894 Chronic pain syndrome: Secondary | ICD-10-CM

## 2019-12-15 NOTE — Progress Notes (Signed)
[facility]  PROGRESS NOTE  PULMONARY SERVICE ROUNDS  Date of Service: 12/15/2019  Andrew Rivera  DOB: 22-Nov-1950  Referring physician: Larena Glassman, MD  HPI: Andrew Rivera is a 69 y.o. male  being seen for Acute on Chronic Respiratory Failure.  Patient currently is on pressure support mode has been on 40% FiO2 with good saturations.  We should be able to try the PMV.  Patient was apparently on 5/5 but did not tolerate that even for short while also had to be placed back on 12/5 on the 12/5 he actually has very good tidal volumes.  Review of Systems: Unremarkable other than noted in HPI  Allergies:  Reviewed on the [facility]  Medications: Reviewed  Vitals: Temperature is 97.5 pulse 64 respiratory rate 18 blood pressure is 120/62 saturations 99%  Ventilator Settings: On pressure support FiO2 30% tidal volume 640 pressure poor 12 PEEP 5  Physical Exam: . General:  calm and comfortable NAD . Eyes: normal lids, irises & conjunctiva . ENT: grossly normal tongue not enlarged . Neck: no masses . Cardiovascular: S1 S2 Normal no rubs no gallop . Respiratory: Scattered rhonchi expansion is equal . Abdomen: soft non-distended . Skin: no rash seen on limited exam . Musculoskeletal:  no rigidity . Psychiatric: unable to assess . Neurologic: no involuntary movements          Lab Data and radiological Data:  Sodium 136 potassium 4.1 BUN 43 creatinine 0.6 White count 7.2 hemoglobin 8.9 hematocrit 27.3 platelet count 198   Assessment/Plan  Patient Active Problem List   Diagnosis Date Noted  . Acute on chronic respiratory failure with hypoxia (HCC)   . Septic shock (HCC)   . Altered mental status, unspecified   . Critical illness myopathy   . Chronic pain syndrome   . Internal jugular (IJ) vein thromboembolism, acute, unspecified laterality (HCC)       1. Acute on chronic respiratory failure with hypoxia continue with pressure support mode currently is on 40% FiO2  tidal volume is 640 which the patient is tolerating fairly well.  Patient was not tolerating pressure support 5/5 2. Sepsis with shock hemodynamics are stable 3. Altered mental state no change 4. Critical illness myopathy continue with therapy 5. Chronic pain controlled 6. IJ thrombosis treated   I have personally evaluated the patient, evaluated the laboratory and imaging results and formulated the assessment and plan and placed orders as needed. The Patient requires high complexity decision making with multiple system involvement. Rounds were done with the Respiratory Therapy Director and respiratory therapist involved in the care of the patient as well as nursing staff.   Yevonne Pax, MD [facility] Pulmonary Critical Care Medicine   This note is for inpatient care

## 2019-12-16 ENCOUNTER — Other Ambulatory Visit (HOSPITAL_COMMUNITY): Payer: Medicare Other | Admitting: Internal Medicine

## 2019-12-16 DIAGNOSIS — I82C19 Acute embolism and thrombosis of unspecified internal jugular vein: Secondary | ICD-10-CM

## 2019-12-16 DIAGNOSIS — R4182 Altered mental status, unspecified: Secondary | ICD-10-CM

## 2019-12-16 DIAGNOSIS — G7281 Critical illness myopathy: Secondary | ICD-10-CM

## 2019-12-16 DIAGNOSIS — R6521 Severe sepsis with septic shock: Secondary | ICD-10-CM

## 2019-12-16 DIAGNOSIS — A419 Sepsis, unspecified organism: Secondary | ICD-10-CM

## 2019-12-16 DIAGNOSIS — J9621 Acute and chronic respiratory failure with hypoxia: Secondary | ICD-10-CM

## 2019-12-16 DIAGNOSIS — G894 Chronic pain syndrome: Secondary | ICD-10-CM

## 2019-12-16 NOTE — Progress Notes (Signed)
[facility]  PROGRESS NOTE  PULMONARY SERVICE ROUNDS  Date of Service: 12/16/2019  Andrew Rivera  DOB: 03-23-1951  Referring physician: Larena Glassman, MD  HPI: Andrew Rivera is a 69 y.o. male  being seen for Acute on Chronic Respiratory Failure.  I switched him over to pressure support 5 today patient actually looks good from the perspective of tidal volumes of about 450 per 500+ however patient after about 15 or 20 minutes stated that he could not breathe and wanted to be placed back on higher support.  Patient was switched back over to a pressure support of 12/5 and his volumes and respiratory rate essentially was unchanged but he stated he felt better.  Review of Systems: Unremarkable other than noted in HPI  Allergies:  Reviewed on the [facility]  Medications: Reviewed  Vitals: Temperature is 98.2 pulse 78 respiratory rate 20 blood pressure is 110/58 saturations are 99%  Ventilator Settings: Mode of ventilation pressure support FiO2 30% pressure 12 PEEP 5 tidal volume 614  Physical Exam: . General:  calm and comfortable NAD . Eyes: normal lids, irises & conjunctiva . ENT: grossly normal tongue not enlarged . Neck: no masses . Cardiovascular: S1 S2 Normal no rubs no gallop . Respiratory: No rhonchi coarse breath sounds are noted . Abdomen: soft non-distended . Skin: no rash seen on limited exam . Musculoskeletal:  no rigidity . Psychiatric: unable to assess . Neurologic: no involuntary movements          Lab Data and radiological Data:  Labs have been reviewed   Assessment/Plan  Patient Active Problem List   Diagnosis Date Noted  . Acute on chronic respiratory failure with hypoxia (HCC)   . Septic shock (HCC)   . Altered mental status, unspecified   . Critical illness myopathy   . Chronic pain syndrome   . Internal jugular (IJ) vein thromboembolism, acute, unspecified laterality (HCC)       1. Acute on chronic respiratory failure with hypoxia when  switching the patient over to pressure support his numbers actually look pretty good it is not clear exactly why he states he cannot breathe sure that there is a component of anxiety and this also along with his respiratory weakness.  We will need to continue to work with that gradually. 2. Altered mental status resolved proved 3. Critical illness myopathy at baseline 4. Chronic pain controlled 5. Sepsis with shock resolved 6. IJ thrombosis treated   I have personally evaluated the patient, evaluated the laboratory and imaging results and formulated the assessment and plan and placed orders as needed. The Patient requires high complexity decision making with multiple system involvement. Rounds were done with the Respiratory Therapy Director and respiratory therapist involved in the care of the patient as well as nursing staff.   Yevonne Pax, MD [facility] Pulmonary Critical Care Medicine   This note is for inpatient care

## 2019-12-17 ENCOUNTER — Other Ambulatory Visit (HOSPITAL_COMMUNITY): Payer: Medicare Other | Admitting: Internal Medicine

## 2019-12-17 DIAGNOSIS — I82C19 Acute embolism and thrombosis of unspecified internal jugular vein: Secondary | ICD-10-CM

## 2019-12-17 DIAGNOSIS — A419 Sepsis, unspecified organism: Secondary | ICD-10-CM

## 2019-12-17 DIAGNOSIS — G894 Chronic pain syndrome: Secondary | ICD-10-CM

## 2019-12-17 DIAGNOSIS — R4182 Altered mental status, unspecified: Secondary | ICD-10-CM

## 2019-12-17 DIAGNOSIS — R6521 Severe sepsis with septic shock: Secondary | ICD-10-CM

## 2019-12-17 DIAGNOSIS — G7281 Critical illness myopathy: Secondary | ICD-10-CM

## 2019-12-17 DIAGNOSIS — J9621 Acute and chronic respiratory failure with hypoxia: Secondary | ICD-10-CM

## 2019-12-17 NOTE — Progress Notes (Signed)
[facility]  PROGRESS NOTE  PULMONARY SERVICE ROUNDS  Date of Service: 12/17/2019  Andrew Rivera  DOB: 1951-06-28  Referring physician: Larena Glassman, MD  HPI: Andrew Rivera is a 69 y.o. male  being seen for Acute on Chronic Respiratory Failure.  Case was discussed on multidisciplinary rounds.  At this time patient is on full support on the ventilator.  Is discharge plan is going to be for skilled nursing facility.  Review of Systems: Unremarkable other than noted in HPI  Allergies:  Reviewed on the [facility]  Medications: Reviewed  Vitals: Temperature is 97.4 pulse 61 respiratory rate 17 blood pressure is 115/72 saturations 99%  Ventilator Settings: Mode of ventilation pressure assist control FiO2 30% inspiratory pressure 16 PEEP 5  Physical Exam: . General:  calm and comfortable NAD . Eyes: normal lids, irises & conjunctiva . ENT: grossly normal tongue not enlarged . Neck: no masses . Cardiovascular: S1 S2 Normal no rubs no gallop . Respiratory: No rhonchi coarse breath sounds . Abdomen: soft non-distended . Skin: no rash seen on limited exam . Musculoskeletal:  no rigidity . Psychiatric: unable to assess . Neurologic: no involuntary movements          Lab Data and radiological Data:  No labs today   Assessment/Plan  Patient Active Problem List   Diagnosis Date Noted  . Acute on chronic respiratory failure with hypoxia (HCC)   . Septic shock (HCC)   . Altered mental status, unspecified   . Critical illness myopathy   . Chronic pain syndrome   . Internal jugular (IJ) vein thromboembolism, acute, unspecified laterality (HCC)       1. Acute on chronic respiratory failure with hypoxia plan is to continue with full support on the ventilator try pressure support and inline valve if he is able to tolerate. 2. Sepsis with shock resolved 3. Altered mental status patient is awake and alert 4. Critical illness myopathy no change 5. Chronic pain  controlled 6. IJ thromboembolism treated we will continue to monitor   I have personally evaluated the patient, evaluated the laboratory and imaging results and formulated the assessment and plan and placed orders as needed. The Patient requires high complexity decision making with multiple system involvement. Rounds were done with the Respiratory Therapy Director and respiratory therapist involved in the care of the patient as well as nursing staff.   Yevonne Pax, MD [facility] Pulmonary Critical Care Medicine   This note is for inpatient care

## 2019-12-18 ENCOUNTER — Other Ambulatory Visit (HOSPITAL_COMMUNITY): Payer: Medicare Other | Admitting: Internal Medicine

## 2019-12-18 DIAGNOSIS — A419 Sepsis, unspecified organism: Secondary | ICD-10-CM

## 2019-12-18 DIAGNOSIS — I82C19 Acute embolism and thrombosis of unspecified internal jugular vein: Secondary | ICD-10-CM

## 2019-12-18 DIAGNOSIS — R4182 Altered mental status, unspecified: Secondary | ICD-10-CM

## 2019-12-18 DIAGNOSIS — J9621 Acute and chronic respiratory failure with hypoxia: Secondary | ICD-10-CM

## 2019-12-18 DIAGNOSIS — G894 Chronic pain syndrome: Secondary | ICD-10-CM

## 2019-12-18 DIAGNOSIS — G7281 Critical illness myopathy: Secondary | ICD-10-CM

## 2019-12-18 DIAGNOSIS — R6521 Severe sepsis with septic shock: Secondary | ICD-10-CM

## 2019-12-18 NOTE — Progress Notes (Signed)
[facility]  PROGRESS NOTE  PULMONARY SERVICE ROUNDS  Date of Service: 12/18/2019  Andrew Rivera  DOB: 03/23/1951  Referring physician: Larena Glassman, MD  HPI: Andrew Rivera is a 69 y.o. male  being seen for Acute on Chronic Respiratory Failure.  Patient at this time is on the ventilator.  He was pressure control mode which he was actually tolerating fairly well with the IP of only 10 he had tidal volumes close to 500  Review of Systems: Unremarkable other than noted in HPI  Allergies:  Reviewed on the [facility]  Medications: Reviewed  Vitals: Temperature is 97.9 pulse 66 respiratory rate 15 blood pressure is 115/65 saturations 100%  Ventilator Settings: Mode of ventilation is pressure assist control FiO2 is 30% IP 10 tidal volume 495 PEEP 5  Physical Exam: . General:  calm and comfortable NAD . Eyes: normal lids, irises & conjunctiva . ENT: grossly normal tongue not enlarged . Neck: no masses . Cardiovascular: S1 S2 Normal no rubs no gallop . Respiratory: No rhonchi no rales are noted at this time . Abdomen: soft non-distended . Skin: no rash seen on limited exam . Musculoskeletal:  no rigidity . Psychiatric: unable to assess . Neurologic: no involuntary movements          Lab Data and radiological Data:  White count 7.3 hemoglobin nine 9.8 hematocrit 30.2 platelet count 219 Sodium 138 potassium 4.1 BUN 43 creatinine 0.5   Assessment/Plan  Patient Active Problem List   Diagnosis Date Noted  . Acute on chronic respiratory failure with hypoxia (HCC)   . Septic shock (HCC)   . Altered mental status, unspecified   . Critical illness myopathy   . Chronic pain syndrome   . Internal jugular (IJ) vein thromboembolism, acute, unspecified laterality (HCC)       1. Acute on chronic respiratory failure with hypoxia plan is to continue with full support on the ventilator.  He still is having difficulty tolerating weaning attempts. 2. Sepsis with shock  resolved 3. Altered mental status improved 4. Critical illness myopathy still with significant weakness 5. Chronic pain syndrome at baseline 6. IJ thrombosis no change supportive care   I have personally evaluated the patient, evaluated the laboratory and imaging results and formulated the assessment and plan and placed orders as needed. The Patient requires high complexity decision making with multiple system involvement. Rounds were done with the Respiratory Therapy Director and respiratory therapist involved in the care of the patient as well as nursing staff.   Yevonne Pax, MD [facility] Pulmonary Critical Care Medicine   This note is for inpatient care

## 2019-12-19 ENCOUNTER — Other Ambulatory Visit (HOSPITAL_COMMUNITY): Payer: Medicare Other | Admitting: Internal Medicine

## 2019-12-19 DIAGNOSIS — I82C19 Acute embolism and thrombosis of unspecified internal jugular vein: Secondary | ICD-10-CM

## 2019-12-19 DIAGNOSIS — A419 Sepsis, unspecified organism: Secondary | ICD-10-CM

## 2019-12-19 DIAGNOSIS — R4182 Altered mental status, unspecified: Secondary | ICD-10-CM

## 2019-12-19 DIAGNOSIS — G894 Chronic pain syndrome: Secondary | ICD-10-CM

## 2019-12-19 DIAGNOSIS — J9621 Acute and chronic respiratory failure with hypoxia: Secondary | ICD-10-CM

## 2019-12-19 DIAGNOSIS — G7281 Critical illness myopathy: Secondary | ICD-10-CM

## 2019-12-19 DIAGNOSIS — R6521 Severe sepsis with septic shock: Secondary | ICD-10-CM

## 2019-12-19 NOTE — Progress Notes (Signed)
[facility]  PROGRESS NOTE  PULMONARY SERVICE ROUNDS  Date of Service: 12/19/2019  Jaksen Fiorella  DOB: 11/11/1950  Referring physician: Larena Glassman, MD  HPI: Lean Fayson is a 69 y.o. male  being seen for Acute on Chronic Respiratory Failure.  Resting comfortably right now on the ventilator and full support.  Patient will be possibly discharged to skilled nursing facility  Review of Systems: Unremarkable other than noted in HPI  Allergies:  Reviewed on the [facility]  Medications: Reviewed  Vitals: Temperature 98.8 pulse 108 respiratory rate 20 blood pressure is 130/60 saturations 100%  Ventilator Settings: On assist control FiO2 is 30% tidal volume 429 PEEP 5  Physical Exam: . General:  calm and comfortable NAD . Eyes: normal lids, irises & conjunctiva . ENT: grossly normal tongue not enlarged . Neck: no masses . Cardiovascular: S1 S2 Normal no rubs no gallop . Respiratory: No rhonchi coarse breath sounds are noted . Abdomen: soft non-distended . Skin: no rash seen on limited exam . Musculoskeletal:  no rigidity . Psychiatric: unable to assess . Neurologic: no involuntary movements          Lab Data and radiological Data:  Sodium 141 potassium 5.2 BUN 45 creatinine 0.7 White count 9.4 hemoglobin 11.3 hematocrit 35.6 platelet count 275 ABG pH 7.34 PCO2 was 71 PO2 233   Assessment/Plan  Patient Active Problem List   Diagnosis Date Noted  . Acute on chronic respiratory failure with hypoxia (HCC)   . Septic shock (HCC)   . Altered mental status, unspecified   . Critical illness myopathy   . Chronic pain syndrome   . Internal jugular (IJ) vein thromboembolism, acute, unspecified laterality (HCC)       1. Acute on chronic respiratory failure hypoxia we will continue with the ventilator on full support he is failed numerous attempts at weaning we will continue to follow along closely. 2. Sepsis with shock resolved hemodynamics stable 3. Critical  illness myopathy will need ongoing therapy 4. Chronic pain controlled 5. Altered mental status resolved 6. IJ thrombosis treated we will continue to monitor closely   I have personally evaluated the patient, evaluated the laboratory and imaging results and formulated the assessment and plan and placed orders as needed. The Patient requires high complexity decision making with multiple system involvement. Rounds were done with the Respiratory Therapy Director and respiratory therapist involved in the care of the patient as well as nursing staff.   Yevonne Pax, MD [facility] Pulmonary Critical Care Medicine   This note is for inpatient care

## 2019-12-20 ENCOUNTER — Other Ambulatory Visit (HOSPITAL_COMMUNITY): Payer: Medicare Other | Admitting: Internal Medicine

## 2019-12-20 DIAGNOSIS — A419 Sepsis, unspecified organism: Secondary | ICD-10-CM

## 2019-12-20 DIAGNOSIS — G894 Chronic pain syndrome: Secondary | ICD-10-CM

## 2019-12-20 DIAGNOSIS — G7281 Critical illness myopathy: Secondary | ICD-10-CM

## 2019-12-20 DIAGNOSIS — R4182 Altered mental status, unspecified: Secondary | ICD-10-CM

## 2019-12-20 DIAGNOSIS — R6521 Severe sepsis with septic shock: Secondary | ICD-10-CM

## 2019-12-20 DIAGNOSIS — J9621 Acute and chronic respiratory failure with hypoxia: Secondary | ICD-10-CM

## 2019-12-20 DIAGNOSIS — I82C19 Acute embolism and thrombosis of unspecified internal jugular vein: Secondary | ICD-10-CM

## 2019-12-20 NOTE — Progress Notes (Signed)
[facility]  PROGRESS NOTE  PULMONARY SERVICE ROUNDS  Date of Service: 12/20/2019  Zacchaeus Halm  DOB: 08-19-1951  Referring physician: Larena Glassman, MD  HPI: Andrew Rivera is a 69 y.o. male  being seen for Acute on Chronic Respiratory Failure.  Patient is on this ventilator on full support he is comfortable no issues noted overnight  Review of Systems: Unremarkable other than noted in HPI  Allergies:  Reviewed on the [facility]  Medications: Reviewed  Vitals: Temperature is 98.4 pulse 88 respiratory rate 21 blood pressure is 110/65 saturations 100%  Ventilator Settings: Mode of ventilation assist control FiO2 30% tidal volume 672 PEEP 5  Physical Exam: . General:  calm and comfortable NAD . Eyes: normal lids, irises & conjunctiva . ENT: grossly normal tongue not enlarged . Neck: no masses . Cardiovascular: S1 S2 Normal no rubs no gallop . Respiratory: No rhonchi coarse breath sounds . Abdomen: soft non-distended . Skin: no rash seen on limited exam . Musculoskeletal:  no rigidity . Psychiatric: unable to assess . Neurologic: no involuntary movements          Lab Data and radiological Data:  No labs today   Assessment/Plan  Patient Active Problem List   Diagnosis Date Noted  . Acute on chronic respiratory failure with hypoxia (HCC)   . Septic shock (HCC)   . Altered mental status, unspecified   . Critical illness myopathy   . Chronic pain syndrome   . Internal jugular (IJ) vein thromboembolism, acute, unspecified laterality (HCC)       1. Acute on chronic respiratory failure with hypoxia continue with full support on the ventilator.  Currently is on 30% FiO2 2. Sepsis with shock hemodynamics are stable 3. Altered mental status resolved 4. Critical illness myopathy no change continue supportive care 5. Chronic pain controlled 6. IJ thrombosis treated we will continue to follow   I have personally evaluated the patient, evaluated the laboratory  and imaging results and formulated the assessment and plan and placed orders as needed. The Patient requires high complexity decision making with multiple system involvement. Rounds were done with the Respiratory Therapy Director and respiratory therapist involved in the care of the patient as well as nursing staff.   Yevonne Pax, MD [facility] Pulmonary Critical Care Medicine   This note is for inpatient care

## 2019-12-22 ENCOUNTER — Other Ambulatory Visit (HOSPITAL_COMMUNITY): Payer: Medicare HMO | Admitting: Internal Medicine

## 2019-12-22 DIAGNOSIS — G894 Chronic pain syndrome: Secondary | ICD-10-CM

## 2019-12-22 DIAGNOSIS — J9621 Acute and chronic respiratory failure with hypoxia: Secondary | ICD-10-CM | POA: Diagnosis not present

## 2019-12-22 DIAGNOSIS — I82C19 Acute embolism and thrombosis of unspecified internal jugular vein: Secondary | ICD-10-CM

## 2019-12-22 DIAGNOSIS — R4182 Altered mental status, unspecified: Secondary | ICD-10-CM

## 2019-12-22 DIAGNOSIS — G7281 Critical illness myopathy: Secondary | ICD-10-CM

## 2019-12-22 DIAGNOSIS — R6521 Severe sepsis with septic shock: Secondary | ICD-10-CM

## 2019-12-22 DIAGNOSIS — A419 Sepsis, unspecified organism: Secondary | ICD-10-CM

## 2019-12-22 NOTE — Progress Notes (Signed)
[facility]  PROGRESS NOTE  PULMONARY SERVICE ROUNDS  Date of Service: 12/22/2019  Andrew Rivera  DOB: 03-18-1951  Referring physician: Larena Glassman, MD  HPI: Andrew Rivera is a 69 y.o. male  being seen for Acute on Chronic Respiratory Failure.  Yesterday patient apparently ran into some trouble with increased work of breathing noted.  Patient eventually was switched over to volume cycle ventilation which did help now he is actually calm and comfortable.  He states that whenever he moves and there is pressure put on his stomach he does get into respiratory distress  Review of Systems: Unremarkable other than noted in HPI  Allergies:  Reviewed on the [facility]  Medications: Reviewed  Vitals: Temperature is 98.8 pulse 78 respiratory rate 20 blood pressure 100/65 saturations 96%  Ventilator Settings: On assist control FiO2 is 30% tidal volume 445 PEEP 5  Physical Exam: . General:  calm and comfortable NAD . Eyes: normal lids, irises & conjunctiva . ENT: grossly normal tongue not enlarged . Neck: no masses . Cardiovascular: S1 S2 Normal no rubs no gallop . Respiratory: No rhonchi coarse breath sounds are noted . Abdomen: soft non-distended . Skin: no rash seen on limited exam . Musculoskeletal:  no rigidity . Psychiatric: unable to assess . Neurologic: no involuntary movements          Lab Data and radiological Data:  White count 6.1 hemoglobin 1.7 hematocrit 29.8 platelet count 249 Sodium 141 potassium 4.6 BUN 54 creatinine 0.7   Assessment/Plan  Patient Active Problem List   Diagnosis Date Noted  . Acute on chronic respiratory failure with hypoxia (HCC)   . Septic shock (HCC)   . Altered mental status, unspecified   . Critical illness myopathy   . Chronic pain syndrome   . Internal jugular (IJ) vein thromboembolism, acute, unspecified laterality (HCC)       1. Acute on chronic respiratory failure with hypoxia plan is to continue on full support on  volume cycled ventilation.  He is supposed to be discharged to skilled nursing facility.  His wife was present at the bedside and I did speak with her explained to her what to expect at the skilled facility when he is transferred over there 2. Sepsis with shock resolved 3. Altered mental status he is at baseline 4. Critical illness myopathy no change 5. Chronic pain at baseline 6. IJ thrombosis continue with supportive care   I have personally evaluated the patient, evaluated the laboratory and imaging results and formulated the assessment and plan and placed orders as needed. The Patient requires high complexity decision making with multiple system involvement. Rounds were done with the Respiratory Therapy Director and respiratory therapist involved in the care of the patient as well as nursing staff.   Yevonne Pax, MD [facility] Pulmonary Critical Care Medicine   This note is for inpatient care

## 2019-12-23 ENCOUNTER — Other Ambulatory Visit (HOSPITAL_COMMUNITY): Payer: Medicare HMO | Admitting: Internal Medicine

## 2019-12-23 DIAGNOSIS — G7281 Critical illness myopathy: Secondary | ICD-10-CM

## 2019-12-23 DIAGNOSIS — J9621 Acute and chronic respiratory failure with hypoxia: Secondary | ICD-10-CM | POA: Diagnosis not present

## 2019-12-23 DIAGNOSIS — G894 Chronic pain syndrome: Secondary | ICD-10-CM

## 2019-12-23 DIAGNOSIS — R4182 Altered mental status, unspecified: Secondary | ICD-10-CM | POA: Diagnosis not present

## 2019-12-23 DIAGNOSIS — A419 Sepsis, unspecified organism: Secondary | ICD-10-CM

## 2019-12-23 DIAGNOSIS — R6521 Severe sepsis with septic shock: Secondary | ICD-10-CM

## 2019-12-23 DIAGNOSIS — I82C19 Acute embolism and thrombosis of unspecified internal jugular vein: Secondary | ICD-10-CM

## 2019-12-23 NOTE — Progress Notes (Signed)
[facility]  PROGRESS NOTE  PULMONARY SERVICE ROUNDS  Date of Service: 12/23/2019  Andrew Rivera  DOB: 1951/07/23  Referring physician: Larena Glassman, MD  HPI: Andrew Rivera is a 69 y.o. male  being seen for Acute on Chronic Respiratory Failure.  He is on the ventilator tentative discharge for tomorrow morning to his skilled nursing facility has not had no further episodes of desaturations  Review of Systems: Unremarkable other than noted in HPI  Allergies:  Reviewed on the [facility]  Medications: Reviewed  Vitals: Temperature is 96.9 pulse 65 respiratory 19 blood pressure is 110/60 saturation 99%  Ventilator Settings: On assist control FiO2 30% tidal line 500 PEEP 5  Physical Exam: . General:  calm and comfortable NAD . Eyes: normal lids, irises & conjunctiva . ENT: grossly normal tongue not enlarged . Neck: no masses . Cardiovascular: S1 S2 Normal no rubs no gallop . Respiratory: No rhonchi no rales are noted at this time . Abdomen: soft non-distended . Skin: no rash seen on limited exam . Musculoskeletal:  no rigidity . Psychiatric: unable to assess . Neurologic: no involuntary movements          Lab Data and radiological Data:  No labs today   Assessment/Plan  Patient Active Problem List   Diagnosis Date Noted  . Acute on chronic respiratory failure with hypoxia (HCC)   . Septic shock (HCC)   . Altered mental status, unspecified   . Critical illness myopathy   . Chronic pain syndrome   . Internal jugular (IJ) vein thromboembolism, acute, unspecified laterality (HCC)       1. Acute on chronic respiratory failure hypoxia we will continue with full support on the ventilator this is his baseline on assist control 2. Sepsis with shock hemodynamics are stable this is resolved 3. Altered mental status he is back to baseline 4. Critical illness myopathy no improvement no change 5. Chronic pain controlled 6. IJ thrombosis treated   I have personally  evaluated the patient, evaluated the laboratory and imaging results and formulated the assessment and plan and placed orders as needed. The Patient requires high complexity decision making with multiple system involvement. Rounds were done with the Respiratory Therapy Director and respiratory therapist involved in the care of the patient as well as nursing staff.   Yevonne Pax, MD [facility] Pulmonary Critical Care Medicine   This note is for inpatient care

## 2020-01-26 ENCOUNTER — Emergency Department (HOSPITAL_COMMUNITY): Payer: Medicare HMO

## 2020-01-26 ENCOUNTER — Encounter (HOSPITAL_COMMUNITY): Payer: Self-pay

## 2020-01-26 ENCOUNTER — Inpatient Hospital Stay (HOSPITAL_COMMUNITY)
Admission: EM | Admit: 2020-01-26 | Discharge: 2020-01-28 | DRG: 208 | Disposition: A | Discharge status: Skilled Nursing Facility | Payer: Medicare HMO | Source: Skilled Nursing Facility | Attending: Internal Medicine | Admitting: Internal Medicine

## 2020-01-26 ENCOUNTER — Other Ambulatory Visit: Payer: Self-pay

## 2020-01-26 DIAGNOSIS — Z930 Tracheostomy status: Secondary | ICD-10-CM | POA: Diagnosis not present

## 2020-01-26 DIAGNOSIS — J690 Pneumonitis due to inhalation of food and vomit: Principal | ICD-10-CM | POA: Diagnosis present

## 2020-01-26 DIAGNOSIS — R0902 Hypoxemia: Secondary | ICD-10-CM

## 2020-01-26 DIAGNOSIS — L89122 Pressure ulcer of left upper back, stage 2: Secondary | ICD-10-CM | POA: Diagnosis present

## 2020-01-26 DIAGNOSIS — J9621 Acute and chronic respiratory failure with hypoxia: Secondary | ICD-10-CM | POA: Diagnosis present

## 2020-01-26 DIAGNOSIS — G629 Polyneuropathy, unspecified: Secondary | ICD-10-CM | POA: Diagnosis present

## 2020-01-26 DIAGNOSIS — G7281 Critical illness myopathy: Secondary | ICD-10-CM | POA: Diagnosis present

## 2020-01-26 DIAGNOSIS — Z20822 Contact with and (suspected) exposure to covid-19: Secondary | ICD-10-CM | POA: Diagnosis present

## 2020-01-26 DIAGNOSIS — Z8619 Personal history of other infectious and parasitic diseases: Secondary | ICD-10-CM

## 2020-01-26 DIAGNOSIS — G894 Chronic pain syndrome: Secondary | ICD-10-CM | POA: Diagnosis present

## 2020-01-26 DIAGNOSIS — Z87891 Personal history of nicotine dependence: Secondary | ICD-10-CM | POA: Diagnosis not present

## 2020-01-26 DIAGNOSIS — L8990 Pressure ulcer of unspecified site, unspecified stage: Secondary | ICD-10-CM | POA: Insufficient documentation

## 2020-01-26 DIAGNOSIS — J9601 Acute respiratory failure with hypoxia: Secondary | ICD-10-CM | POA: Diagnosis present

## 2020-01-26 LAB — COMPREHENSIVE METABOLIC PANEL
ALT: 20 U/L (ref 0–44)
AST: 19 U/L (ref 15–41)
Albumin: 3.1 g/dL — ABNORMAL LOW (ref 3.5–5.0)
Alkaline Phosphatase: 113 U/L (ref 38–126)
Anion gap: 10 (ref 5–15)
BUN: 38 mg/dL — ABNORMAL HIGH (ref 8–23)
CO2: 31 mmol/L (ref 22–32)
Calcium: 9.7 mg/dL (ref 8.9–10.3)
Chloride: 98 mmol/L (ref 98–111)
Creatinine, Ser: 0.48 mg/dL — ABNORMAL LOW (ref 0.61–1.24)
GFR calc Af Amer: >60 mL/min (ref >60)
GFR calc non Af Amer: >60 mL/min (ref >60)
Glucose, Bld: 99 mg/dL (ref 70–99)
Potassium: 3.8 mmol/L (ref 3.5–5.1)
Sodium: 139 mmol/L (ref 135–145)
Total Bilirubin: 0.6 mg/dL (ref 0.3–1.2)
Total Protein: 7.4 g/dL (ref 6.5–8.1)

## 2020-01-26 LAB — CBC WITH DIFFERENTIAL/PLATELET
Abs Immature Granulocytes: 0.03 10*3/uL (ref 0.00–0.07)
Basophils Absolute: 0 10*3/uL (ref 0.0–0.1)
Basophils Relative: 1 %
Eosinophils Absolute: 0.2 10*3/uL (ref 0.0–0.5)
Eosinophils Relative: 3 %
HCT: 33.7 % — ABNORMAL LOW (ref 39.0–52.0)
Hemoglobin: 10.5 g/dL — ABNORMAL LOW (ref 13.0–17.0)
Immature Granulocytes: 0 %
Lymphocytes Relative: 21 %
Lymphs Abs: 1.8 10*3/uL (ref 0.7–4.0)
MCH: 31.3 pg (ref 26.0–34.0)
MCHC: 31.2 g/dL (ref 30.0–36.0)
MCV: 100.3 fL — ABNORMAL HIGH (ref 80.0–100.0)
Monocytes Absolute: 0.7 10*3/uL (ref 0.1–1.0)
Monocytes Relative: 8 %
Neutro Abs: 5.8 10*3/uL (ref 1.7–7.7)
Neutrophils Relative %: 67 %
Platelets: 189 10*3/uL (ref 150–400)
RBC: 3.36 MIL/uL — ABNORMAL LOW (ref 4.22–5.81)
RDW: 16.2 % — ABNORMAL HIGH (ref 11.5–15.5)
WBC: 8.6 10*3/uL (ref 4.0–10.5)
nRBC: 0 % (ref 0.0–0.2)

## 2020-01-26 LAB — RESPIRATORY PANEL BY RT PCR (FLU A&B, COVID)
Influenza A by PCR: NEGATIVE
Influenza B by PCR: NEGATIVE
SARS Coronavirus 2 by RT PCR: NEGATIVE

## 2020-01-26 LAB — TROPONIN I (HIGH SENSITIVITY): Troponin I (High Sensitivity): 15 ng/L (ref <18)

## 2020-01-26 MED ORDER — PANTOPRAZOLE SODIUM 40 MG PO PACK
40.0000 mg | PACK | Freq: Every day | ORAL | Status: DC
  Administered 2020-01-27 – 2020-01-28 (×2): 40 mg
  Filled 2020-01-26 (×2): qty 20

## 2020-01-26 MED ORDER — ACETYLCYSTEINE 20 % IN SOLN
2.0000 mL | RESPIRATORY_TRACT | Status: DC
  Administered 2020-01-27 (×2): 4 mL via RESPIRATORY_TRACT
  Administered 2020-01-27 (×4): 2 mL via RESPIRATORY_TRACT
  Administered 2020-01-28: 4 mL via RESPIRATORY_TRACT
  Administered 2020-01-28 (×2): 2 mL via RESPIRATORY_TRACT
  Administered 2020-01-28: 4 mL via RESPIRATORY_TRACT
  Filled 2020-01-26 (×12): qty 4

## 2020-01-26 MED ORDER — HEPARIN SODIUM (PORCINE) 5000 UNIT/ML IJ SOLN
5000.0000 [IU] | Freq: Three times a day (TID) | INTRAMUSCULAR | Status: DC
  Administered 2020-01-27: 5000 [IU] via SUBCUTANEOUS
  Filled 2020-01-26: qty 1

## 2020-01-26 MED ORDER — ALBUTEROL SULFATE (2.5 MG/3ML) 0.083% IN NEBU
2.5000 mg | INHALATION_SOLUTION | RESPIRATORY_TRACT | Status: DC
  Administered 2020-01-27 – 2020-01-28 (×10): 2.5 mg via RESPIRATORY_TRACT
  Filled 2020-01-26 (×11): qty 3

## 2020-01-26 MED ORDER — VANCOMYCIN HCL 1500 MG/300ML IV SOLN
1500.0000 mg | Freq: Once | INTRAVENOUS | Status: AC
  Administered 2020-01-27: 1500 mg via INTRAVENOUS
  Filled 2020-01-26: qty 300

## 2020-01-26 MED ORDER — IOHEXOL 350 MG/ML SOLN
65.0000 mL | Freq: Once | INTRAVENOUS | Status: AC | PRN
  Administered 2020-01-26: 23:00:00 65 mL via INTRAVENOUS

## 2020-01-26 MED ORDER — PIPERACILLIN-TAZOBACTAM 3.375 G IVPB
3.3750 g | Freq: Four times a day (QID) | INTRAVENOUS | Status: DC
  Administered 2020-01-27: 3.375 g via INTRAVENOUS
  Filled 2020-01-26: qty 50

## 2020-01-26 MED ORDER — PIPERACILLIN-TAZOBACTAM 3.375 G IVPB 30 MIN
3.3750 g | Freq: Once | INTRAVENOUS | Status: AC
  Administered 2020-01-27: 3.375 g via INTRAVENOUS
  Filled 2020-01-26: qty 50

## 2020-01-26 NOTE — ED Provider Notes (Signed)
Emergency Department Provider Note   I have reviewed the triage vital signs and the nursing notes.   HISTORY  Chief Complaint Shortness of Breath   HPI Andrew Rivera is a 69 y.o. male with PMH of Chronic respiratory failure with hypoxemia on tracheostomy from [facility] with report of hypoxemia this evening. The nurse on staff could not provide solid history regarding the surrounding circumstances on scene per EMS. They arrived to find the patient on 70% FiO2. He was awake and alert with EMS and was transported. Patient denies feeling SOB currently but was earlier. No CP. No report of fever.   Level 5 caveat: Trach dependent   Past Medical History:  Diagnosis Date  . Acute on chronic respiratory failure with hypoxia (HCC)   . Altered mental status, unspecified   . Chronic pain syndrome   . Critical illness myopathy   . Internal jugular (IJ) vein thromboembolism, acute, unspecified laterality (HCC)   . Septic shock [facility])     Patient Active Problem List   Diagnosis Date Noted  . Acute hypoxemic respiratory failure (HCC) 01/26/2020  . Acute on chronic respiratory failure with hypoxia (HCC)   . Septic shock (HCC)   . Altered mental status, unspecified   . Critical illness myopathy   . Chronic pain syndrome   . Internal jugular (IJ) vein thromboembolism, acute, unspecified laterality (HCC)     History reviewed. No pertinent surgical history.  Allergies Patient has no allergy information on record.  History reviewed. No pertinent family history.  Social History Social History   Tobacco Use  . Smoking status: Former Smoker    Packs/day: 1.00    Types: Cigarettes    Quit date: 01/25/1990    Years since quitting: 30.0  . Smokeless tobacco: Never Used  Substance Use Topics  . Alcohol use: Not Currently  . Drug use: Never    Review of Systems  Constitutional: No fever/chills Eyes: No visual changes. Cardiovascular: Denies chest pain. Respiratory:  Positive shortness of breath. Gastrointestinal: No abdominal pain.  Musculoskeletal: Negative for back pain. Skin: Negative for rash. Neurological: Negative for headaches.  10-point ROS otherwise negative.  ____________________________________________   PHYSICAL EXAM:  VITAL SIGNS: ED Triage Vitals [01/26/20 2041]  Enc Vitals Group     BP (!) 116/53     Pulse Rate 73     Resp 20     Temp 98.7 F (37.1 C)     Temp Source Oral     SpO2 100 %     Weight 160 lb (72.6 kg)     Height 5\' 8"  (1.727 m)   Constitutional: Alert and answering yes/know questions briskly. Well appearing and in no acute distress. Eyes: Conjunctivae are normal. PERRL.  Head: Atraumatic. Nose: No congestion/rhinnorhea. Mouth/Throat: Mucous membranes are moist.   Neck: No stridor.   Cardiovascular: Normal rate, regular rhythm. Good peripheral circulation. Grossly normal heart sounds.   Respiratory: Normal respiratory effort.  No retractions. Lungs with bilateral coarse breath sounds without wheezing.  Gastrointestinal: Soft and nontender. No distention.  Musculoskeletal: No lower extremity tenderness nor edema. Neurologic: Awake and alert. Following commands briskly.  Skin:  Skin is warm, dry and intact. No rash noted.   ____________________________________________   LABS (all labs ordered are listed, but only abnormal results are displayed)  Labs Reviewed  COMPREHENSIVE METABOLIC PANEL - Abnormal; Notable for the following components:      Result Value   BUN 38 (*)    Creatinine, Ser 0.48 (*)  Albumin 3.1 (*)    All other components within normal limits  CBC WITH DIFFERENTIAL/PLATELET - Abnormal; Notable for the following components:   RBC 3.36 (*)    Hemoglobin 10.5 (*)    HCT 33.7 (*)    MCV 100.3 (*)    RDW 16.2 (*)    All other components within normal limits  BASIC METABOLIC PANEL - Abnormal; Notable for the following components:   Chloride 96 (*)    BUN 34 (*)    Creatinine, Ser  0.42 (*)    All other components within normal limits  CBC - Abnormal; Notable for the following components:   WBC 12.0 (*)    RBC 3.10 (*)    Hemoglobin 9.8 (*)    HCT 30.9 (*)    RDW 16.0 (*)    All other components within normal limits  RESPIRATORY PANEL BY RT PCR (FLU A&B, COVID)  MRSA PCR SCREENING  CULTURE, BLOOD (ROUTINE X 2)  CULTURE, BLOOD (ROUTINE X 2)  CULTURE, RESPIRATORY  MAGNESIUM  PHOSPHORUS  GLUCOSE, CAPILLARY  GLUCOSE, CAPILLARY  GLUCOSE, CAPILLARY  URINALYSIS, ROUTINE W REFLEX MICROSCOPIC  STREP PNEUMONIAE URINARY ANTIGEN  TROPONIN I (HIGH SENSITIVITY)  TROPONIN I (HIGH SENSITIVITY)   ____________________________________________  EKG   EKG Interpretation  Date/Time:  Monday January 26 2020 20:35:47 EDT Ventricular Rate:  75 PR Interval:    QRS Duration: 86 QT Interval:  377 QTC Calculation: 421 R Axis:   97 Text Interpretation: Sinus rhythm Consider left atrial enlargement Probable anterior infarct, age indeterminate Lateral leads are also involved Baseline wander in lead(s) V6 No STEMI Confirmed by Nanda Quinton [phone]) on 01/26/2020 8:50:21 PM       ____________________________________________  RADIOLOGY  CT Angio Chest PE W and/or Wo Contrast  Result Date: 01/26/2020 CLINICAL DATA:  69 year old male with shortness of breath. EXAM: CT ANGIOGRAPHY CHEST WITH CONTRAST TECHNIQUE: Multidetector CT imaging of the chest was performed using the standard protocol during bolus administration of intravenous contrast. Multiplanar CT image reconstructions and MIPs were obtained to evaluate the vascular anatomy. CONTRAST:  88mL OMNIPAQUE IOHEXOL 350 MG/ML SOLN COMPARISON:  Chest radiograph dated 01/26/2020. FINDINGS: Cardiovascular: There is no cardiomegaly or pericardial effusion. Coronary vascular calcification primarily involving the LAD. There is mild atherosclerotic calcification of the thoracic aorta. Evaluation of the pulmonary arteries is somewhat limited due  to respiratory motion artifact. No pulmonary artery embolus identified. Mediastinum/Nodes: No definite hilar or mediastinal adenopathy. Evaluation however is limited due to consolidative changes of the right lower lobe. The esophagus is grossly unremarkable. No mediastinal fluid collection. Lungs/Pleura: There is eventration of the right hemidiaphragm. There is a large area of consolidative change with air bronchogram involving the right lower lobe and to a lesser degree right middle lobe. There are clusters of nodular density with tree-in-bud appearance in the right upper lobe. Findings most consistent with multifocal pneumonia and possibly related to aspiration. Clinical correlation recommended. There is background of mild centrilobular emphysema. Linear streaky densities as well as faint nodular densities in the left lower lobe also likely infectious in etiology. There is no pleural effusion or pneumothorax. Mucus secretion noted along the posterior wall of the trachea extending into the mainstem bronchi bilaterally. There is a tracheostomy with tip above the carina. The central airway remain patent. Upper Abdomen: Several hepatic hypodense lesions which are not well characterized on this CT. A gastrostomy is noted. Musculoskeletal: Osteopenia with degenerative changes of the spine. T1-T2 posterior fusion as well as evidence of prior lower  thoracic posterior fusion. No acute osseous pathology. Review of the MIP images confirms the above findings. IMPRESSION: 1. No CT evidence of pulmonary artery embolus. 2. Multifocal pneumonia including an area of consolidative change in the right lung base, possibly related to aspiration. Clinical correlation is recommended. 3. Aortic Atherosclerosis (ICD10-I70.0) and Emphysema (ICD10-J43.9). Electronically Signed   By: Elgie Collard M.D.   On: 01/26/2020 23:36   DG Chest Portable 1 View  Result Date: 01/26/2020 CLINICAL DATA:  Shortness of breath EXAM: PORTABLE CHEST 1  VIEW COMPARISON:  01/26/2020 FINDINGS: Elevation of the right hemidiaphragm. Small right pleural effusion with right base atelectasis. No confluent opacity on the left. Heart is borderline in size. Tracheostomy in place with the tip projecting over the mid trachea. IMPRESSION: Elevation of the right hemidiaphragm. Small right pleural effusion with right base atelectasis. Electronically Signed   By: Charlett Nose M.D.   On: 01/26/2020 20:53    ____________________________________________   PROCEDURES  Procedure(s) performed:   Procedures  CRITICAL CARE Performed by: Maia Plan Total critical care time: 35 minutes Critical care time was exclusive of separately billable procedures and treating other patients. Critical care was necessary to treat or prevent imminent or life-threatening deterioration. Critical care was time spent personally by me on the following activities: development of treatment plan with patient and/or surrogate as well as nursing, discussions with consultants, evaluation of patient's response to treatment, examination of patient, obtaining history from patient or surrogate, ordering and performing treatments and interventions, ordering and review of laboratory studies, ordering and review of radiographic studies, pulse oximetry and re-evaluation of patient's condition.  Alona Bene, MD Emergency Medicine  ____________________________________________   INITIAL IMPRESSION / ASSESSMENT AND PLAN / ED COURSE  Pertinent labs & imaging results that were available during my care of the patient were reviewed by me and considered in my medical decision making (see chart for details).   Patient is in chronic respiratory failure with tracheostomy presents with hypoxemia and shortness of breath.  Episode appears to have been transient.  Patient's vitals here are unremarkable.  His FiO2 on arrival is 70%.  In review of his prior notes he has been as low as 30% FiO2 requirement.   Will attempt to wean down.  Plan on sending Covid testing along with screening blood work and chest x-ray. No ischemic changes on EKG per my independent interpretation.   10:42 PM  Called the patient bedside with RT they are tempting to suction and intermittently having to BVM.  Patient with acute hypoxemia which seem to be related to mucous plugging.  With suctioning there is some thick secretions.  Patient desatted quickly into the low 80s and then improved now on FiO2 100%.   No infiltrate on CXR. Will send for CTA chest to evaluate for PE +/- infectious process.   Discussed patient's case with ICU to request admission. Patient and family (if present) updated with plan. Care transferred to ICU service.  I reviewed all nursing notes, vitals, pertinent old records, EKGs, labs, imaging (as available).  ____________________________________________  FINAL CLINICAL IMPRESSION(S) / ED DIAGNOSES  Final diagnoses:  Hypoxia     MEDICATIONS GIVEN DURING THIS VISIT:  Medications  pantoprazole sodium (PROTONIX) 40 mg/20 mL oral suspension 40 mg (40 mg Per Tube Given 01/27/20 0943)  acetylcysteine (MUCOMYST) 20 % nebulizer / oral solution 2 mL (2 mLs Nebulization Given 01/27/20 1130)  albuterol (PROVENTIL) (2.5 MG/3ML) 0.083% nebulizer solution 2.5 mg (2.5 mg Nebulization Given 01/27/20 1129)  aspirin  chewable tablet 81 mg (81 mg Per Tube Given 01/27/20 0943)  insulin aspart (novoLOG) injection 0-15 Units (0 Units Subcutaneous Not Given 01/27/20 1137)  chlorhexidine (PERIDEX) 0.12 % solution 15 mL (15 mLs Mouth Rinse Given 01/27/20 0943)  MEDLINE mouth rinse (15 mLs Mouth Rinse Given 01/27/20 1121)  HYDROmorphone HCl (DILAUDID) liquid 1 mg (1 mg Per Tube Given 01/27/20 1117)  zolpidem (AMBIEN) tablet 5 mg (5 mg Per Tube Given 01/27/20 0441)  piperacillin-tazobactam (ZOSYN) IVPB 3.375 g (has no administration in time range)  Chlorhexidine Gluconate Cloth 2 % PADS 6 each (6 each Topical Given 01/27/20 0948)  lip  balm (CARMEX) ointment (has no administration in time range)  feeding supplement (VITAL AF 1.2 CAL) liquid 1,000 mL (has no administration in time range)  enoxaparin (LOVENOX) injection 40 mg (has no administration in time range)  iohexol (OMNIPAQUE) 350 MG/ML injection 65 mL (65 mLs Intravenous Contrast Given 01/26/20 2322)  piperacillin-tazobactam (ZOSYN) IVPB 3.375 g (0 g Intravenous Stopped 01/27/20 0115)  vancomycin (VANCOREADY) IVPB 1500 mg/300 mL (0 mg Intravenous Stopped 01/27/20 0245)  potassium chloride 20 MEQ/15ML (10%) solution 40 mEq (40 mEq Oral Given 01/27/20 0942)    Note:  This document was prepared using Dragon voice recognition software and may include unintentional dictation errors.  Alona Bene, MD, [facility] Emergency Medicine    Nikodem Leadbetter, Arlyss Repress, MD 01/27/20 [phone]

## 2020-01-26 NOTE — Progress Notes (Signed)
RT NOTE:  Pt transported to CT and back without event.  

## 2020-01-26 NOTE — ED Notes (Signed)
Critical care at bedside  

## 2020-01-26 NOTE — H&P (Signed)
NAME:  Andrew Rivera, MRN:  [id number], DOB:  07/21/1951, LOS: 1 ADMISSION DATE:  01/26/2020, CONSULTATION DATE: 01/27/2020 REFERRING MD: [facility], CHIEF COMPLAINT: Worsening hypoxemia  Brief History   69 year old white male on chronic vent with episode of hypoxemia this evening  History of present illness   Patient is a 69 year old white male transferred from [facility] with an episode of hypoxemia  Past Medical History  She is a 69 year old male with a history of multiple back surgeries over the last 10 years was admitted to [facility] with MSSA bacteremia and 07/13/2019 and subsequently was readmitted back in November with recurrence of MSSA bacteremia and a prolonged ventilator time ultimately requiring tracheostomy.  Decision was made in early December to remove his final hardware below his cervical spinal hardware.  He was treated with 6 weeks of IV nafcillin. Before November the patient was experiencing increased weakness over the last few years.  He used a walker starting about 2 years ago for mobilization.  He has had chronic left extremity weakness but fairly good grip strength in the right hand.  Since being hospitalized in November he has been unable to bear weight and really has not been up in a chair much.  He tells me that since he has been at [facility], for about the last 4 weeks, he has been off the ventilator intermittently in the last week for an hour or 2 at a time. This evening he was transferred to [facility] for further evaluation after having an episodic decrease in his oxygen saturation.  He is normally on 40% at [facility] and required 70% FiO2 here at [facility] to bring his oxygen saturations up to an adequate level.  Nursing reports a large amount of mucous plugs were removed when he initially presented and that his breath sounds were coarse.  Patient says that his breath sounds were coarse.  On my evaluation he is clear.  CT scan of the chest does show an elevated right  hemidiaphragm but also appears to have a small wedge shaped area of infiltrate consistent with right lower lobe pneumonia.  Notes that do also indicate other than a paralyzed right diaphragm he does have some left diaphragmatic weakness.  In general the ICU physicians at [facility] felt that he had some degree of an ICU myopathy/neuropathy as a cause for his current respiratory failure. CT scan of the chest shows the area of pneumonia in the right lower lobe but no evidence of pulmonary embolus.  Lab work is relatively unremarkable, he does have a hemoglobin of 10.5.   Significant Hospital Events   NA  Consults:  PCCM  Procedures:  NA  Significant Diagnostic Tests:  NA  Micro Data:  Tracheal aspirate to be sent  Antimicrobials:  Empiric Zosyn and vancomycin pending aspirate results  Interim history/subjective:  NA  Objective   Blood pressure (!) 130/56, pulse 82, temperature 98.7 F (37.1 C), temperature source Oral, resp. rate (!) 23, height 5\' 8"  (1.727 m), weight 72.6 kg, SpO2 100 %.    Vent Mode: PRVC FiO2 (%):  [40 %-60 %] 60 % Set Rate:  [16 bmp] 16 bmp Vt Set:  [500 mL] 500 mL PEEP:  [8 cmH20] 8 cmH20 Plateau Pressure:  [20 cmH20] 20 cmH20  No intake or output data in the 24 hours ending 01/27/20 0000 Filed Weights   01/26/20 2041  Weight: 72.6 kg    Examination: General: Pleasant white male in no acute distress ventilated HENT: Tracheostomy site  appears clean otherwise unremarkable Lungs: Occasional coarse breath sounds but relatively clear both lung fields Cardiovascular: Regular Abdomen: Benign PEG site looks good without evidence of infection Extremities: Within normal limits, left arm is flaccid right arm with good strength reduced strength but essentially equal in both lower extremities 3 out of 5 Neuro: Nonfocal GU: Within normal limits  Resolved Hospital Problem list   NA  Assessment & Plan:  1.  Hypoxemia with chronic respiratory failure: Respiratory  failure seems predominantly due to right paralyzed hemidiaphragm and generalized muscle weakness secondary to prolonged ICU stay, myopathy, neuropathy.  Episode of hypoxemia while at [facility] seems most likely secondary to mucous plug complicated by right lower lobe pneumonia  2.  History of MSSA bacteremia with history of infected spinal orthopedic devices  3.  Right lower lobe pneumonia: We will empirically treat with Zosyn and vancomycin and plant tracheal aspirate  Best practice:  Diet: N.p.o. (patient usually takes continuous tube feeds) Pain/Anxiety/Delirium protocol (if indicated): N/A VAP protocol (if indicated): Yes DVT prophylaxis: Lovenox GI prophylaxis: Pepcid Glucose control: Monitor Mobility: Bedrest Code Status: Full Family Communication: Discussed with patient Disposition: To ICU for antibiotics, ventilation and monitoring  Labs   CBC: Recent Labs  Lab 01/26/20 2116  WBC 8.6  NEUTROABS 5.8  HGB 10.5*  HCT 33.7*  MCV 100.3*  PLT 332    Basic Metabolic Panel: Recent Labs  Lab 01/26/20 2116  NA 139  K 3.8  CL 98  CO2 31  GLUCOSE 99  BUN 38*  CREATININE 0.48*  CALCIUM 9.7   GFR: Estimated Creatinine Clearance: 85.5 mL/min (A) (by C-G formula based on SCr of 0.48 mg/dL (L)). Recent Labs  Lab 01/26/20 2116  WBC 8.6    Liver Function Tests: Recent Labs  Lab 01/26/20 2116  AST 19  ALT 20  ALKPHOS 113  BILITOT 0.6  PROT 7.4  ALBUMIN 3.1*   No results for input(s): LIPASE, AMYLASE in the last 168 hours. No results for input(s): AMMONIA in the last 168 hours.  ABG No results found for: PHART, PCO2ART, PO2ART, HCO3, TCO2, ACIDBASEDEF, O2SAT   Coagulation Profile: No results for input(s): INR, PROTIME in the last 168 hours.  Cardiac Enzymes: No results for input(s): CKTOTAL, CKMB, CKMBINDEX, TROPONINI in the last 168 hours.  HbA1C: No results found for: HGBA1C  CBG: No results for input(s): GLUCAP in the last 168 hours.  Review of  Systems:   Other than dyspnea no positives on 13 point review of systems  Past Medical History  He,  has a past medical history of Acute on chronic respiratory failure with hypoxia ([place]), Altered mental status, unspecified, Chronic pain syndrome, Critical illness myopathy, Internal jugular (IJ) vein thromboembolism, acute, unspecified laterality ([place]), and Septic shock ([place]).   Surgical History   History reviewed. No pertinent surgical history.   Social History   reports that he quit smoking about 30 years ago. His smoking use included cigarettes. He smoked 1.00 pack per day. He has never used smokeless tobacco. He reports previous alcohol use. He reports that he does not use drugs.   Family History   His family history is not on file.   Allergies Not on File   Home Medications  Prior to Admission medications   Not on File     Critical care time: Over 35 minutes was spent in chart review bedside evaluation and critical care planning

## 2020-01-26 NOTE — ED Notes (Signed)
Assumed care of pt. Pt back from CT without incidence. Pt alert. Breathing easy, non-labored. Call light within reach. Will continue to monitor.

## 2020-01-26 NOTE — ED Notes (Signed)
Patient wife calling asking for an update on patient Andrew Rivera [phone]

## 2020-01-26 NOTE — ED Notes (Signed)
Troponin drawn, labeled with 2 pt identifiers, and sent to lab 

## 2020-01-26 NOTE — ED Triage Notes (Signed)
Pt bib ems. Reporting that the pt had a moment of hypoxia today. Pt arrived ona  Trach at 70%/. Pt is aox4. Vitals WNL.

## 2020-01-26 NOTE — ED Notes (Signed)
Pt de-satted into the low 80's. Respiratory was called and the pt was suctioned. Provider notified.

## 2020-01-27 LAB — BASIC METABOLIC PANEL
Anion gap: 15 (ref 5–15)
BUN: 34 mg/dL — ABNORMAL HIGH (ref 8–23)
CO2: 27 mmol/L (ref 22–32)
Calcium: 9.8 mg/dL (ref 8.9–10.3)
Chloride: 96 mmol/L — ABNORMAL LOW (ref 98–111)
Creatinine, Ser: 0.42 mg/dL — ABNORMAL LOW (ref 0.61–1.24)
GFR calc Af Amer: >60 mL/min (ref >60)
GFR calc non Af Amer: >60 mL/min (ref >60)
Glucose, Bld: 94 mg/dL (ref 70–99)
Potassium: 3.7 mmol/L (ref 3.5–5.1)
Sodium: 138 mmol/L (ref 135–145)

## 2020-01-27 LAB — GLUCOSE, CAPILLARY
Glucose-Capillary: 93 mg/dL (ref 70–99)
Glucose-Capillary: 91 mg/dL (ref 70–99)
Glucose-Capillary: 87 mg/dL (ref 70–99)
Glucose-Capillary: 76 mg/dL (ref 70–99)
Glucose-Capillary: 102 mg/dL — ABNORMAL HIGH (ref 70–99)

## 2020-01-27 LAB — PHOSPHORUS: Phosphorus: 3.8 mg/dL (ref 2.5–4.6)

## 2020-01-27 LAB — CBC
HCT: 30.9 % — ABNORMAL LOW (ref 39.0–52.0)
Hemoglobin: 9.8 g/dL — ABNORMAL LOW (ref 13.0–17.0)
MCH: 31.6 pg (ref 26.0–34.0)
MCHC: 31.7 g/dL (ref 30.0–36.0)
MCV: 99.7 fL (ref 80.0–100.0)
Platelets: 170 10*3/uL (ref 150–400)
RBC: 3.1 MIL/uL — ABNORMAL LOW (ref 4.22–5.81)
RDW: 16 % — ABNORMAL HIGH (ref 11.5–15.5)
WBC: 12 10*3/uL — ABNORMAL HIGH (ref 4.0–10.5)
nRBC: 0 % (ref 0.0–0.2)

## 2020-01-27 LAB — MRSA PCR SCREENING: MRSA by PCR: NEGATIVE

## 2020-01-27 LAB — MAGNESIUM: Magnesium: 2.3 mg/dL (ref 1.7–2.4)

## 2020-01-27 LAB — TROPONIN I (HIGH SENSITIVITY): Troponin I (High Sensitivity): 14 ng/L (ref <18)

## 2020-01-27 MED ORDER — CHLORHEXIDINE GLUCONATE 0.12 % MT SOLN
15.0000 mL | Freq: Two times a day (BID) | OROMUCOSAL | Status: DC
  Administered 2020-01-27 (×3): 15 mL via OROMUCOSAL
  Filled 2020-01-27 (×2): qty 15

## 2020-01-27 MED ORDER — VITAL AF 1.2 CAL PO LIQD
1000.0000 mL | ORAL | Status: DC
  Administered 2020-01-27: 1000 mL

## 2020-01-27 MED ORDER — INSULIN ASPART 100 UNIT/ML ~~LOC~~ SOLN: 0.0000 [IU] | SUBCUTANEOUS | Status: DC

## 2020-01-27 MED ORDER — ORAL CARE MOUTH RINSE
15.0000 mL | Freq: Two times a day (BID) | OROMUCOSAL | Status: DC
  Administered 2020-01-27: 15 mL via OROMUCOSAL

## 2020-01-27 MED ORDER — PIPERACILLIN-TAZOBACTAM 3.375 G IVPB
3.3750 g | Freq: Three times a day (TID) | INTRAVENOUS | Status: DC
  Administered 2020-01-27 – 2020-01-28 (×3): 3.375 g via INTRAVENOUS
  Filled 2020-01-27 (×3): qty 50

## 2020-01-27 MED ORDER — ENOXAPARIN SODIUM 40 MG/0.4ML ~~LOC~~ SOLN
40.0000 mg | SUBCUTANEOUS | Status: DC
  Administered 2020-01-27: 21:00:00 40 mg via SUBCUTANEOUS
  Filled 2020-01-27: qty 0.4

## 2020-01-27 MED ORDER — CHLORHEXIDINE GLUCONATE CLOTH 2 % EX PADS
6.0000 | MEDICATED_PAD | Freq: Every day | CUTANEOUS | Status: DC
  Administered 2020-01-27 – 2020-01-28 (×2): 6 via TOPICAL

## 2020-01-27 MED ORDER — HYDROMORPHONE HCL 1 MG/ML PO LIQD
1.0000 mg | ORAL | Status: DC | PRN
  Administered 2020-01-27 – 2020-01-28 (×4): 1 mg
  Filled 2020-01-27 (×6): qty 1

## 2020-01-27 MED ORDER — PRO-STAT SUGAR FREE PO LIQD: 30.0000 mL | Freq: Two times a day (BID) | ORAL | Status: DC

## 2020-01-27 MED ORDER — LIP MEDEX EX OINT
TOPICAL_OINTMENT | CUTANEOUS | Status: DC | PRN
  Filled 2020-01-27: qty 7

## 2020-01-27 MED ORDER — ZOLPIDEM TARTRATE 5 MG PO TABS
5.0000 mg | ORAL_TABLET | Freq: Every evening | ORAL | Status: DC | PRN
  Administered 2020-01-27 (×2): 5 mg
  Filled 2020-01-27 (×2): qty 1

## 2020-01-27 MED ORDER — POTASSIUM CHLORIDE 20 MEQ/15ML (10%) PO SOLN
40.0000 meq | Freq: Once | ORAL | Status: AC
  Administered 2020-01-27: 40 meq via ORAL
  Filled 2020-01-27: qty 30

## 2020-01-27 MED ORDER — ASPIRIN 81 MG PO CHEW
81.0000 mg | CHEWABLE_TABLET | Freq: Every day | ORAL | Status: DC
  Administered 2020-01-27 – 2020-01-28 (×2): 81 mg
  Filled 2020-01-27 (×2): qty 1

## 2020-01-27 MED ORDER — VANCOMYCIN HCL IN DEXTROSE 1-5 GM/200ML-% IV SOLN: 1000.0000 mg | Freq: Two times a day (BID) | INTRAVENOUS | Status: DC

## 2020-01-27 NOTE — Progress Notes (Signed)
Pharmacy Antibiotic Note  Andrew Rivera is a 69 y.o. male admitted on 01/26/2020 with pneumonia.  Pharmacy has been consulted for vancomycin dosing.  Plan: Vancomycin 1500mg  x1 then 1000mg  IV Q12H. Goal AUC 400-550.  Expected AUC 500.  SCr used 0.8 Change Zosyn to 3.375g IV Q8H (4-hour infusion).  Height: 5\' 8"  (172.7 cm) Weight: 72.6 kg (160 lb) IBW/kg (Calculated) : 68.4  Temp (24hrs), Avg:98.7 F (37.1 C), Min:98.7 F (37.1 C), Max:98.7 F (37.1 C)  Recent Labs  Lab 01/26/20 2116  WBC 8.6  CREATININE 0.48*    Estimated Creatinine Clearance: 85.5 mL/min (A) (by C-G formula based on SCr of 0.48 mg/dL (L)).     Thank you for allowing pharmacy to be a part of this patient's care.  , PharmD, BCPS  01/27/2020 12:01 AM

## 2020-01-27 NOTE — Progress Notes (Signed)
Initial Nutrition Assessment  DOCUMENTATION CODES:   Not applicable  INTERVENTION:   Tube feeding:  Vital AF 1.2 @ 65 ml/hr via PEG (1560 ml)  Provides: 1872 kcals, 117 grams protein, 1265 ml free water.   NUTRITION DIAGNOSIS:   Increased nutrient needs related to wound healing as evidenced by estimated needs.  GOAL:   Patient will meet greater than or equal to 90% of their needs  MONITOR:   Skin, TF tolerance, Weight trends, Labs, I & O's  REASON FOR ASSESSMENT:   Consult, Ventilator Enteral/tube feeding initiation and management  ASSESSMENT:   Patient with PMH significant for multiple back surgeries. Recently admitted to [facility] with MSSA bacteremia, required prolonged ventilator time, and is s/p trach. Presents this admission with from [facility] respiratory failure likely due to R paralyzed hemidiaphragm and R lower lobe PNA.   RD working remotely.  Unable to obtain history PTA. Unsure of tube feeding product pt used at [facility]. Okay to start feeding per CCM.   Records indicate pt weighed 63.3 kg on 06/29/2019 at [facility] and 62.8 kg this admission (insignficiant for time frame).   Patient requiring ventilator support via trach MV: 11.5 L/min Temp (24hrs), Avg:98.7 F (37.1 C), Min:98.3 F (36.8 C), Max:99.1 F (37.3 C)  Medications: SS novolog Labs: CBG 87-93  Diet Order:   Diet Order    None      EDUCATION NEEDS:   Not appropriate for education at this time  Skin:  Skin Assessment: Skin Integrity Issues: Skin Integrity Issues:: Stage II, DTI DTI: L back Stage II: R buttocks  Last BM:  PTA  Height:   Ht Readings from Last 1 Encounters:  01/26/20 5\' 8"  (1.727 m)    Weight:   Wt Readings from Last 1 Encounters:  01/27/20 62.8 kg    BMI:  Body mass index is 21.05 kg/m.  Estimated Nutritional Needs:   Kcal:  1750-1950 kcal  Protein:  100-120 grams  Fluid:  >/= 1.8 L/day  03/28/20 RD, LDN Clinical Nutrition Pager listed in  AMION

## 2020-01-27 NOTE — Evaluation (Signed)
Physical Therapy Evaluation Patient Details Name: Andrew Rivera MRN: [id number] DOB: 08-Jan-1951 Today's Date: 01/27/2020   History of Present Illness  Patient is a 69 year old white male transferred from [facility] with an episode of hypoxemia  He is normally on 40% at [facility] and required 70% FiO2 here at [facility] to bring his oxygen saturations up to an adequate level.  PMH of multiple back surgeries over the last 10 years was admitted to [facility] with MSSA bacteremia and 07/13/2019 and subsequently was readmitted back in November with recurrence of MSSA bacteremia and a prolonged ventilator time ultimately requiring tracheostomy.  Clinical Impression  Patient presents with mobility limited due to prolonged bedrest and vent dependency with decreased L UE strength compared to R and decreased sensation L UE & LE and history of chronic back pain.  Patient currently needing +2 A for rolling in bed and not OOB x 4 weeks.  Agreeable to try with lift next session.  Patient will benefit from skilled PT in the acute setting to progress mobility and increase endurance with plan for return [facility] at d/c.     Follow Up Recommendations LTACH    Equipment Recommendations  None recommended by PT    Recommendations for Other Services       Precautions / Restrictions Precautions Precautions: Fall;Other (comment) Precaution Comments: chronic vent/trach/PEG      Mobility  Bed Mobility Overal bed mobility: Needs Assistance Bed Mobility: Rolling Rolling: Mod assist;+2 for physical assistance         General bed mobility comments: assisted to flex knee and cued to bring shoulder forward, assist to turn for changing pad in bed with RN in room to help.  Transfers                 General transfer comment: deferred today due to +2 A needed  Ambulation/Gait                Stairs            Wheelchair Mobility    Modified Rankin (Stroke Patients Only)       Balance        Sitting balance - Comments: NT today                                     Pertinent Vitals/Pain Pain Assessment: 0-10 Pain Score: 6  Pain Location: low back Pain Descriptors / Indicators: Aching Pain Intervention(s): Monitored during session;Repositioned;Premedicated before session    Home Living Family/patient expects to be discharged to:: Other (Comment)                 Additional Comments: [facility]    Prior Function Level of Independence: Needs assistance   Gait / Transfers Assistance Needed: walked with a walker prior to hospitalization in November           Hand Dominance        Extremity/Trunk Assessment   Upper Extremity Assessment Upper Extremity Assessment: LUE deficits/detail;RUE deficits/detail RUE Deficits / Details: AROM limited to about 70 degrees shoulder flexion (AAROM About 80 with pain), strength shoulder flexion 3-/5, elbow flexion 3+/5, grip fair LUE Deficits / Details: PROM shoulder flexion 80 with pain, others WFL, strength wrist flex/ext 2/5, elbow flex 1-/5, shoulder flexion 0/5 LUE Sensation: decreased light touch    Lower Extremity Assessment Lower Extremity Assessment: LLE deficits/detail;RLE deficits/detail RLE Deficits / Details: AAROM tight hamstrings,  hip extensors with some pain with stretching, strength hip flexion 2-/5, knee extension 2+/5, ankle DF 2/5 RLE Sensation: WNL LLE Deficits / Details: AAROM tight hamstrings, hip extensors with some pain with stretching, strength hip flexion 2-/5, knee extension 2+/5, ankle DF 2/5 LLE Sensation: decreased light touch       Communication   Communication: Tracheostomy(mouths words to communicate)  Cognition Arousal/Alertness: Awake/alert Behavior During Therapy: WFL for tasks assessed/performed Overall Cognitive Status: Difficult to assess                                 General Comments: mouths words to communicate, seems mostly intact from  little communication      General Comments General comments (skin integrity, edema, etc.): Patient with trach on vent PRVC 40% FiO2 PEEP of 8    Exercises General Exercises - Upper Extremity Shoulder Flexion: AAROM;PROM;Right;Left;Supine;5 reps Elbow Flexion: AROM;AAROM;Supine;5 reps;Left;Right Wrist Flexion: AROM;Left;5 reps;Supine Wrist Extension: AROM;Left;5 reps;Supine General Exercises - Lower Extremity Ankle Circles/Pumps: 10 reps;Supine;AAROM Quad Sets: AROM;5 reps;Supine;Both Other Exercises Other Exercises: resisted hip and knee flexion in supine x 8-10 reps each leg   Assessment/Plan    PT Assessment    PT Problem List         PT Treatment Interventions      PT Goals (Current goals can be found in the Care Plan section)  Acute Rehab PT Goals Patient Stated Goal: eager for OOB next session PT Goal Formulation: With patient Time For Goal Achievement: 02/10/20 Potential to Achieve Goals: Good    Frequency     Barriers to discharge        Co-evaluation               AM-PAC PT "6 Clicks" Mobility  Outcome Measure Help needed turning from your back to your side while in a flat bed without using bedrails?: Total Help needed moving from lying on your back to sitting on the side of a flat bed without using bedrails?: Total Help needed moving to and from a bed to a chair (including a wheelchair)?: Total Help needed standing up from a chair using your arms (e.g., wheelchair or bedside chair)?: Total Help needed to walk in hospital room?: Total Help needed climbing 3-5 steps with a railing? : Total 6 Click Score: 6    End of Session Equipment Utilized During Treatment: Oxygen(vent) Activity Tolerance: Patient tolerated treatment well Patient left: in bed;with call bell/phone within reach   PT Visit Diagnosis: Muscle weakness (generalized) (M62.81);Other symptoms and signs involving the nervous system (R29.898);Difficulty in walking, not elsewhere  classified (R26.2)    Time: 1205-1227 PT Time Calculation (min) (ACUTE ONLY): 22 min   Charges:   PT Evaluation $PT Eval Moderate Complexity: 1 Mod          Sheran Lawless, [place] [facility] [phone] 01/27/2020   [name] 01/27/2020, 4:38 PM

## 2020-01-27 NOTE — Progress Notes (Signed)
Highlighted changes on flowsheet were changes that I found to patient vent. Will clarify with MD to get order changed. Patient doing well.

## 2020-01-27 NOTE — TOC Initial Note (Signed)
Transition of Care [facility]) - Initial/Assessment Note    Patient Details  Name: Andrew Rivera MRN: [id number] Date of Birth: 1951/10/13  [facility] CM/SW Contact:    Terrial Rhodes, LCSWA Phone Number: 01/27/2020, 3:09 PM  Clinical Narrative:              CSW spoke with patients spouse Andrew Rivera by phone. Andrew Rivera patients spouse is agreeable to patient going back to [facility].   CSW will continue to follow.       Expected Discharge Plan: Skilled Nursing Facility Barriers to Discharge: Continued Medical Work up   Patient Goals and CMS Choice Patient states their goals for this hospitalization and ongoing recovery are:: to go to skilled nursing facility CMS Medicare.gov Compare Post Acute Care list provided to:: Patient Represenative (must comment)(Spouse Andrew Rivera) Choice offered to / list presented to : Spouse(Donna)  Expected Discharge Plan and Services Expected Discharge Plan: Skilled Nursing Facility       Living arrangements for the past 2 months: Skilled Nursing Facility                                      Prior Living Arrangements/Services Living arrangements for the past 2 months: Skilled Nursing Facility   Patient language and need for interpreter reviewed:: Yes Do you feel safe going back to the place where you live?: Yes      Need for Family Participation in Patient Care: Yes (Comment) Care giver support system in place?: Yes (comment)   Criminal Activity/Legal Involvement Pertinent to Current Situation/Hospitalization: No - Comment as needed  Activities of Daily Living Home Assistive Devices/Equipment: Enteral Feeding Supplies, Feeding equipment, Oxygen, Vent/Trach supplies ADL Screening (condition at time of admission) Patient's cognitive ability adequate to safely complete daily activities?: No Is the patient deaf or have difficulty hearing?: No Does the patient have difficulty seeing, even when wearing glasses/contacts?: No Does the  patient have difficulty concentrating, remembering, or making decisions?: No Patient able to express need for assistance with ADLs?: Yes Does the patient have difficulty dressing or bathing?: Yes Independently performs ADLs?: No Communication: Appropriate for developmental age, Needs assistance Is this a change from baseline?: Pre-admission baseline Dressing (OT): Dependent Is this a change from baseline?: Pre-admission baseline Grooming: Dependent Is this a change from baseline?: Pre-admission baseline Does the patient have difficulty walking or climbing stairs?: Yes Weakness of Legs: Both Weakness of Arms/Hands: Left  Permission Sought/Granted Permission sought to share information with : Case Manager, Family Supports, Magazine features editor    Share Information with NAME: Andrew Rivera  Permission granted to share info w AGENCY: [facility]  Permission granted to share info w Relationship: spouse  Permission granted to share info w Contact Information: Spouse Product manager  Emotional Assessment         Alcohol / Substance Use: Not Applicable Psych Involvement: No (comment)  Admission diagnosis:  Hypoxia [R09.02] Acute hypoxemic respiratory failure (HCC) [J96.01] Patient Active Problem List   Diagnosis Date Noted  . Acute hypoxemic respiratory failure (HCC) 01/26/2020  . Acute on chronic respiratory failure with hypoxia (HCC)   . Septic shock (HCC)   . Altered mental status, unspecified   . Critical illness myopathy   . Chronic pain syndrome   . Internal jugular (IJ) vein thromboembolism, acute, unspecified laterality (HCC)    PCP:  Larena Glassman, MD Pharmacy:  No Pharmacies Listed    Social Determinants of  Health (SDOH) Interventions    Readmission Risk Interventions No flowsheet data found.

## 2020-01-27 NOTE — Consult Note (Signed)
WOC Nurse Consult Note: Patient receiving care in [facility] 2H14.  Assisted with turning and positioning by primary RN. Reason for Consult: DTI upon arrival Wound type: left, lateral, mid-back with an evolving DTPI Pressure Injury POA: Yes Measurement: 1.5 cm x 2.2 cm x no measureable depth Wound bed: pink and purple Drainage (amount, consistency, odor) none Periwound: intact Dressing procedure/placement/frequency:  Apply a small piece of Xeroform gauze [place] # 294) to the wound on the left lateral mid-back, cover with a small foam dressing.  Change every 2 days and prn. Monitor the wound area(s) for worsening of condition such as: Signs/symptoms of infection,  Increase in size,  Development of or worsening of odor, Development of pain, or increased pain at the affected locations.  Notify the medical team if any of these develop.  Thank you for the consult.  Discussed plan of care with the bedside nurse.  WOC nurse will not follow at this time.  Please re-consult the WOC team if needed.  Helmut Muster, RN, MSN, CWOCN, CNS-BC, pager [phone]

## 2020-01-27 NOTE — Progress Notes (Signed)
RT NOTE:  Pt transported to 2H without complication. Report given to [place], RT

## 2020-01-27 NOTE — ED Notes (Signed)
Cultures drawn, labeled with 2 pt identifiers, and sent to lab. Abx initiated per [facility]. Name/DOB verified with pt

## 2020-01-27 NOTE — ED Notes (Signed)
Pt transported to 2H Rm 14 in NAD with all belongings on continuous monitors with this RN and RT. Pt alert. Breathing easy, non-labored.

## 2020-01-27 NOTE — Progress Notes (Signed)
eLink Physician-Brief Progress Note Patient Name: Andrew Rivera DOB: Nov 17, 1950 MRN: [id number]   Date of Service  01/27/2020  HPI/Events of Note  Patient requests home Ambien and Dilaudid.   eICU Interventions  Will order: 1. Ambien 5 mg per tube QHS PRN Sleep. 2. Dilaudid 1 mg per tube Q 4 hours PRN severe pain.      Intervention Category Major Interventions: Other:  Teyton Pattillo Dennard Nip 01/27/2020, 3:17 AM

## 2020-01-27 NOTE — [place] FL2 (Signed)
[place] MEDICAID FL2 LEVEL OF CARE SCREENING TOOL     IDENTIFICATION  Patient Name: Andrew Rivera Birthdate: 1950-11-16 Sex: male Admission Date (Current Location): 01/26/2020  [facility] and [place] Number:  Herbalist and Address:  [facility] [place], [place] [address], [place],  [address]      Provider Number: [id number]  Attending Physician Name and Address:  Spero Geralds, MD  Relative Name and Phone Number:  Butch Penny [phone]    Current Level of Care: Hospital Recommended Level of Care: Vent SNF Prior Approval Number:    Date Approved/Denied:   PASRR Number: [id number]  Discharge Plan: SNF    Current Diagnoses: Patient Active Problem List   Diagnosis Date Noted  . Acute hypoxemic respiratory failure ([place]) 01/26/2020  . Acute on chronic respiratory failure with hypoxia ([place])   . Septic shock ([place])   . Altered mental status, unspecified   . Critical illness myopathy   . Chronic pain syndrome   . Internal jugular (IJ) vein thromboembolism, acute, unspecified laterality (HCC)     Orientation RESPIRATION BLADDER Height & Weight     (Alert; intubated)  Tracheostomy, Vent(Vent 40% FiO2) Incontinent, External catheter Weight: 138 lb 7.2 oz (62.8 kg) Height:  5\' 8"  (172.7 cm)  BEHAVIORAL SYMPTOMS/MOOD NEUROLOGICAL BOWEL NUTRITION STATUS      Continent Feeding tube  AMBULATORY STATUS COMMUNICATION OF NEEDS Skin   Extensive Assist Verbally PU Stage and Appropriate Care, Other (Comment)(Stage II on buttocks;deep tissue injury on back)                       Personal Care Assistance Level of Assistance  Bathing, Feeding, Dressing           Functional Limitations Info  Sight, Hearing, Speech Sight Info: Adequate Hearing Info: Adequate Speech Info: Impaired(Intubated)    SPECIAL CARE FACTORS FREQUENCY                       Contractures Contractures Info: Not present    Additional Factors Info  Code Status,  Allergies, Insulin Sliding Scale Code Status Info: Full Allergies Info: Not on file   Insulin Sliding Scale Info: See DC Summary for dose       Current Medications (01/27/2020):  This is the current hospital active medication list Current Facility-Administered Medications  Medication Dose Route Frequency Provider Last Rate Last Admin  . acetylcysteine (MUCOMYST) 20 % nebulizer / oral solution 2 mL  2 mL Nebulization Q4H Omar Person, NP   2 mL at 01/27/20 1537  . albuterol (PROVENTIL) (2.5 MG/3ML) 0.083% nebulizer solution 2.5 mg  2.5 mg Nebulization Q4H Omar Person, NP   2.5 mg at 01/27/20 1537  . aspirin chewable tablet 81 mg  81 mg Per Tube Daily Omar Person, NP   81 mg at 01/27/20 0943  . chlorhexidine (PERIDEX) 0.12 % solution 15 mL  15 mL Mouth Rinse BID Shellia Cleverly, MD   15 mL at 01/27/20 0943  . Chlorhexidine Gluconate Cloth 2 % PADS 6 each  6 each Topical Daily Spero Geralds, MD   6 each at 01/27/20 [id number]  . enoxaparin (LOVENOX) injection 40 mg  40 mg Subcutaneous Q24H Kris Mouton, [facility]      . feeding supplement (VITAL AF 1.2 CAL) liquid 1,000 mL  1,000 mL Per Tube Continuous Spero Geralds, MD 65 mL/hr at 01/27/20 1452 1,000 mL at 01/27/20 1452  .  HYDROmorphone HCl (DILAUDID) liquid 1 mg  1 mg Per Tube Q4H PRN Karl Ito, MD   1 mg at 01/27/20 1117  . insulin aspart (novoLOG) injection 0-15 Units  0-15 Units Subcutaneous Q4H Tobey Grim, NP      . lip balm (CARMEX) ointment   Topical PRN Charlott Holler, MD      . MEDLINE mouth rinse  15 mL Mouth Rinse q12n4p Norman Clay, MD   15 mL at 01/27/20 1121  . pantoprazole sodium (PROTONIX) 40 mg/20 mL oral suspension 40 mg  40 mg Per Tube Daily Tobey Grim, NP   40 mg at 01/27/20 0943  . piperacillin-tazobactam (ZOSYN) IVPB 3.375 g  3.375 g Intravenous Q8H Silvana Newness, RPH 12.5 mL/hr at 01/27/20 1500 Rate Verify at 01/27/20 1500  . zolpidem (AMBIEN) tablet 5 mg  5 mg Per Tube QHS  PRN Karl Ito, MD   5 mg at 01/27/20 0441     Discharge Medications: Please see discharge summary for a list of discharge medications.  Relevant Imaging Results:  Relevant Lab Results:   Additional Information SSN: [id number] [place], [place]

## 2020-01-27 NOTE — Progress Notes (Signed)
NAME:  Andrew Rivera, MRN:  [id number], DOB:  04/08/1951, LOS: 1 ADMISSION DATE:  01/26/2020 REFERRING MD:  EDP, CHIEF COMPLAINT:  Acute on chronic hypoxemic respiratory failure  Brief History   This is a 69 y.o. 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 Was discharged to [facility] but since being hospitalized in November has been minimally mobile - in the last 4 weeks has not been up in a chair, not been able to bear weight, and has only been off the ventilator for 1-2 hours at a time.   Consults:    Procedures:    Significant Diagnostic Tests:  CT Angio 4/5 with right lower lobe atelectasis/consolidation consistent with mucus plug aspiration pneumonia. Elevated right hemidiaphragm.  Micro Data:  4/5 covid 19 and flu negative 4/5 MRSA pcr negative  Antimicrobials:  Vancomycin 4/5 >> Zosyn 4/5>>  Interim history/subjective:  Overnight weaned down to 50% FiO2 from 70%.   Objective   Blood pressure (!) 101/42, pulse 71, temperature 98.8 F (37.1 C), temperature source Oral, resp. rate 18, height 5\' 8"  (1.727 m), weight 62.8 kg, SpO2 100 %.    Vent Mode: PRVC FiO2 (%):  [40 %-70 %] 50 % Set Rate:  [16 bmp] 16 bmp Vt Set:  [500 mL] 500 mL PEEP:  [8 cmH20-10 cmH20] 10 cmH20 Plateau Pressure:  [16 cmH20-20 cmH20] 20 cmH20   Intake/Output Summary (Last 24 hours) at 01/27/2020 0806 Last data filed at 01/27/2020 0700 Gross per 24 hour  Intake 22.67 ml  Output 175 ml  Net -152.33 ml   Filed Weights   01/26/20 2041 01/27/20 0113  Weight: 72.6 kg 62.8 kg    Examination: General: chronically ill appearing HENT: Trach to vent Lungs: diminished, no wheeze or crackles Cardiovascular: RRR, no mrg Abdomen: soft, peg in place, site is clean Extremities: no edema Neuro: awake, alert, mouths and follows commands.  Resolved Hospital Problem list     Assessment & Plan:  Andrew Rivera is a 69 y.o.  man who presents with:  Acute on chronic hypoxemic respiratory failure HAP Elevated Right hemidiaphragm - will d/c vancomycin given MRSA pcr negative - continue piperacillin-tazobactam until sputum culture has resulted, will narrow according - wean down ventilator support as tolerated, continue lung protective ventilation  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  - PT/OT   Best practice:  Diet: will resume tube feeds today. Pain/Anxiety/Delirium protocol (if indicated): pain control as needed.  VAP protocol (if indicated): ordered DVT prophylaxis: heparin [place] GI prophylaxis: pepcid Glucose control: controlled<180 Foley n/a Mobility: PT/OT consulted Code Status: Full Family Communication: patient updated.  Disposition: needs ICU   Labs   CBC: Recent Labs  Lab 01/26/20 2116 01/27/20 0229  WBC 8.6 12.0*  NEUTROABS 5.8  --   HGB 10.5* 9.8*  HCT 33.7* 30.9*  MCV 100.3* 99.7  PLT 189 287    Basic Metabolic Panel: Recent Labs  Lab 01/26/20 2116 01/27/20 0229  NA 139 138  K 3.8 3.7  CL 98 96*  CO2 31 27  GLUCOSE 99 94  BUN 38* 34*  CREATININE 0.48* 0.42*  CALCIUM 9.7 9.8  MG  --  2.3  PHOS  --  3.8   GFR: Estimated Creatinine Clearance: 78.5 mL/min (A) (by C-G formula based on SCr of 0.42 mg/dL (L)). Recent Labs  Lab 01/26/20 2116 01/27/20 0229  WBC  8.6 12.0*    Liver Function Tests: Recent Labs  Lab 01/26/20 2116  AST 19  ALT 20  ALKPHOS 113  BILITOT 0.6  PROT 7.4  ALBUMIN 3.1*   No results for input(s): LIPASE, AMYLASE in the last 168 hours. No results for input(s): AMMONIA in the last 168 hours.  ABG No results found for: PHART, PCO2ART, PO2ART, HCO3, TCO2, ACIDBASEDEF, O2SAT   Coagulation Profile: No results for input(s): INR, PROTIME in the last 168 hours.  Cardiac Enzymes: No results for input(s): CKTOTAL, CKMB, CKMBINDEX, TROPONINI in the last 168 hours.  HbA1C: No  results found for: HGBA1C  CBG: Recent Labs  Lab 01/27/20 0453 01/27/20 0742  GLUCAP 93 91    Critical care time:   The patient is critically ill with multiple organ systems failure and requires high complexity decision making for assessment and support, frequent evaluation and titration of therapies, application of advanced monitoring technologies and extensive interpretation of multiple databases.   Critical Care Time devoted to patient care services described in this note is 33  minutes. This time reflects the time of my personal involvement. This critical care time does not reflect separately billable procedures or procedure time, teaching time or supervisory time of PA/NP/Med student/Med Resident etc but could involve care discussion time.  Mickel Baas Pulmonary [facility] Critical Care Medicine 01/27/2020 8:06 AM  Pager: [phone] After hours pager: [phone]

## 2020-01-27 NOTE — ED Notes (Signed)
Report given to [place], RN. All questions answered

## 2020-01-28 DIAGNOSIS — G7281 Critical illness myopathy: Secondary | ICD-10-CM

## 2020-01-28 DIAGNOSIS — J690 Pneumonitis due to inhalation of food and vomit: Principal | ICD-10-CM

## 2020-01-28 DIAGNOSIS — L8990 Pressure ulcer of unspecified site, unspecified stage: Secondary | ICD-10-CM | POA: Insufficient documentation

## 2020-01-28 LAB — GLUCOSE, CAPILLARY
Glucose-Capillary: 110 mg/dL — ABNORMAL HIGH (ref 70–99)
Glucose-Capillary: 113 mg/dL — ABNORMAL HIGH (ref 70–99)
Glucose-Capillary: 91 mg/dL (ref 70–99)
Glucose-Capillary: 95 mg/dL (ref 70–99)

## 2020-01-28 MED ORDER — AMOXICILLIN-POT CLAVULANATE 400-57 MG/5ML PO SUSR
875.0000 mg | Freq: Two times a day (BID) | ORAL | Status: DC
  Administered 2020-01-28: 12:00:00 875 mg via ORAL
  Filled 2020-01-28 (×2): qty 10.9

## 2020-01-28 MED ORDER — AMOXICILLIN-POT CLAVULANATE 400-57 MG/5ML PO SUSR: 875.0000 mg | Freq: Two times a day (BID) | ORAL | 0 refills | Status: DC

## 2020-01-28 MED ORDER — HYDROMORPHONE HCL 1 MG/ML PO LIQD: 1.0000 mg | ORAL | 0 refills | Status: DC | PRN

## 2020-01-28 MED ORDER — ONDANSETRON HCL 4 MG/2ML IJ SOLN
4.0000 mg | Freq: Four times a day (QID) | INTRAMUSCULAR | Status: DC | PRN
  Administered 2020-01-28: 4 mg via INTRAVENOUS
  Filled 2020-01-28: qty 2

## 2020-01-28 NOTE — Progress Notes (Signed)
NAME:  Andrew Rivera, MRN:  [id number], DOB:  07-Nov-1950, LOS: 2 ADMISSION DATE:  01/26/2020 REFERRING MD:  EDP, CHIEF COMPLAINT:  Acute on chronic hypoxemic respiratory failure  Brief History   This is a 69 y.o. man with a history of prolonged admission to [facility] in the fall of 2020 with recurrent back surgeries, infected hardware on IV ABX. Was discharged to [facility] but since being hospitalized in November has been minimally mobile - in the last 4 weeks has not been up in a chair, not been able to bear weight, and has only been off the ventilator for 1-2 hours at a time.   Consults:    Procedures:    Significant Diagnostic Tests:  CT Angio 4/5 with right lower lobe atelectasis/consolidation consistent with mucus plug aspiration pneumonia. Elevated right hemidiaphragm.  Micro Data:  4/5 covid 19 and flu negative 4/5 MRSA pcr negative 4/6 BCx2 >> 4/7 trach asp (not sent prior to) >>  Antimicrobials:  Vancomycin 4/5  Zosyn 4/5>>4/7 Augmentin 4/7>>   Interim history/subjective:  No issues overnight.  Down to 40% FiO2 Reports of patient drinking oral fluids at [facility], has been asking here Remains afebrile No complaints per patient   Objective   Blood pressure (!) 133/48, pulse 71, temperature 97.6 F (36.4 C), temperature source Axillary, resp. rate 19, height 5\' 8"  (1.727 m), weight 62.3 kg, SpO2 97 %.    Vent Mode: PRVC FiO2 (%):  [40 %-50 %] 40 % Set Rate:  [15 bmp-16 bmp] 15 bmp Vt Set:  [500 mL-550 mL] 550 mL PEEP:  [8 cmH20-10 cmH20] 8 cmH20 Plateau Pressure:  [16 cmH20-20 cmH20] 18 cmH20   Intake/Output Summary (Last 24 hours) at 01/28/2020 0744 Last data filed at 01/28/2020 0600 Gross per 24 hour  Intake 232.53 ml  Output 1150 ml  Net -917.47 ml   Filed Weights   01/26/20 2041 01/27/20 0113 01/28/20 0600  Weight: 72.6 kg 62.8 kg 62.3 kg    Examination: General:  Chronically ill elderly male on MV in NAD HEENT: MM pink/moist, midline cuffed portex  trach Neuro: sleeping prior to, awakens easily, mouths to communicate, f/c, MAE weakly except LUE which is flaccid  CV: rr, no mumur PULM:  Non labored on full MV support, PRVC, coarse throughout, minimal secretions- clear GI: soft, bs +, NT, PEG LUQ site wnl, urine pouch in place  Extremities: warm/dry, no LE edema  Skin: no rashes, left/ lateral shoulder/ back wound not visualized   Resolved Hospital Problem list     Assessment & Plan:   Acute on chronic hypoxemic respiratory failure HAP vs aspiration PNA Elevated Right hemidiaphragm - continue full support MV - back to minimal settings/ FiO2 - NPO, do not recommend any oral fluids/ drinking - resend trach cultures (never sent) - at baseline, can return to [facility] today  - likely can deescalate to augmentin given no prior respiratory MRDOs and await culture data - continue aggressive pulmonary toilet   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  - PT/OT  Chronic pressure wound- left/lateral back/ shoulder - per WOC - maximize nutrition per TF   Best practice:  Diet: continue TF Pain/Anxiety/Delirium protocol (if indicated): pain control as needed.  VAP protocol (if indicated): ordered DVT prophylaxis: heparin [place] GI prophylaxis: pepcid Glucose control: controlled<180 Foley n/a Mobility: PT/OT  Code Status: Full Family Communication: patient updated. Will call wife and update Disposition: ICU, can return to [facility] today  Labs   CBC: Recent Labs  Lab 01/26/20 2116 01/27/20 0229  WBC 8.6 12.0*  NEUTROABS 5.8  --   HGB 10.5* 9.8*  HCT 33.7* 30.9*  MCV 100.3* 99.7  PLT 189 322    Basic Metabolic Panel: Recent Labs  Lab 01/26/20 2116 01/27/20 0229  NA 139 138  K 3.8 3.7  CL 98 96*  CO2 31 27  GLUCOSE 99 94  BUN 38* 34*  CREATININE 0.48* 0.42*  CALCIUM 9.7 9.8  MG  --  2.3  PHOS  --  3.8   GFR: Estimated Creatinine Clearance: 77.9  mL/min (A) (by C-G formula based on SCr of 0.42 mg/dL (L)). Recent Labs  Lab 01/26/20 2116 01/27/20 0229  WBC 8.6 12.0*    Liver Function Tests: Recent Labs  Lab 01/26/20 2116  AST 19  ALT 20  ALKPHOS 113  BILITOT 0.6  PROT 7.4  ALBUMIN 3.1*   No results for input(s): LIPASE, AMYLASE in the last 168 hours. No results for input(s): AMMONIA in the last 168 hours.  ABG No results found for: PHART, PCO2ART, PO2ART, HCO3, TCO2, ACIDBASEDEF, O2SAT   Coagulation Profile: No results for input(s): INR, PROTIME in the last 168 hours.  Cardiac Enzymes: No results for input(s): CKTOTAL, CKMB, CKMBINDEX, TROPONINI in the last 168 hours.  HbA1C: No results found for: HGBA1C  CBG: Recent Labs  Lab 01/27/20 1125 01/27/20 1513 01/27/20 2010 01/28/20 0021 01/28/20 0446  GLUCAP 87 76 102* 95 113*   CRITICAL CARE Performed by: Kennieth Rad   Total critical care time: 30 minutes   Critical care time was exclusive of separately billable procedures and treating other patients.   Critical care was necessary to treat or prevent imminent or life-threatening deterioration.   Critical care was time spent personally by me on the following activities: development of treatment plan with patient and/or surrogate as well as nursing, discussions with consultants, evaluation of patient's response to treatment, examination of patient, obtaining history from patient or surrogate, ordering and performing treatments and interventions, ordering and review of laboratory studies, ordering and review of radiographic studies, pulse oximetry and re-evaluation of patient's condition.  Kennieth Rad, MSN, AGACNP-BC [facility] 01/28/2020, 7:44 AM

## 2020-01-28 NOTE — TOC Transition Note (Signed)
Transition of Care [facility]) - CM/SW Discharge Note   Patient Details  Name: Andrew Rivera MRN: [id number] Date of Birth: 1951-06-01  [facility] CM/SW Contact:  Terrial Rhodes, LCSWA Phone Number: 01/28/2020, 10:20 AM   Clinical Narrative:     Patient will DC to: [facility]  Anticipated DC date: 01/28/2020  Family notified: [name]  Transport by: Melburn Hake  ?  Per MD patient ready for DC to [facility] . RN, patient, patient's family, and facility notified of DC. Discharge Summary sent to facility. RN given number for report [phone]. DC packet on chart. [facility] transport requested for patient.  CSW signing off.    Final next level of care: Skilled Nursing Facility Barriers to Discharge: Continued Medical Work up   Patient Goals and CMS Choice Patient states their goals for this hospitalization and ongoing recovery are:: to go to [facility] SN[facility] CMS Medicare.gov Compare Post Acute Care list provided to:: Patient Represenative (must comment)(Donna Spouse) Choice offered to / list presented to : Spouse  Discharge Placement              Patient chooses bed at: ([facility]) Patient to be transferred to facility by: [facility] Name of family member notified: [name] Patient and family notified of of transfer: 01/28/20  Discharge Plan and Services                                     Social Determinants of Health (SDOH) Interventions     Readmission Risk Interventions No flowsheet data found.

## 2020-01-28 NOTE — Discharge Summary (Signed)
Physician Discharge Summary         Patient ID: Andrew Rivera MRN: [id number] DOB/AGE: 11/13/1950 69 y.o.  Admit date: 01/26/2020 Discharge date: 01/28/2020  Discharge Diagnoses:    Acute on chronic hypoxemic respiratory failure  Right lower lobe pneumonia  Elevated right hemidiaphragm  Critical illness myopathy Chronic pressure wound   Discharge summary    69 year old male with a history of multiple back surgeries over the last 10 years was admitted to [facility] with MSSA bacteremia and 07/13/2019 and subsequently was readmitted back in November with recurrence of MSSA bacteremia and a prolonged ventilator time ultimately requiring tracheostomy.  Decision was made in early December to remove his final hardware below his cervical spinal hardware.  He was treated with 6 weeks of IV nafcillin. Before November, the patient was experiencing increased weakness over the last few years. He used a walker starting about 2 years ago for mobilization.  He has had chronic left extremity weakness but fairly good grip strength in the right hand.  Since being hospitalized in November he has been unable to bear weight and really has not been up in a chair much.  For about the last 4 weeks at [facility], he has been off the ventilator intermittently in the last week for an hour or 2 at a time.  On the evening of 4/5, he was transferred to [facility] for further evaluation after having an episodic decrease in his oxygen saturation.  He is normally on 40% at [facility] and required 70% FiO2 here at [facility] to bring his oxygen saturations up to an adequate level.  Nursing reports a large amount of mucous plugs were removed when he initially presented and that his breath sounds were coarse.  CT scan of the chest does show an elevated right hemidiaphragm but also appears to have a small wedge shaped area of infiltrate consistent with right lower lobe pneumonia; no evidence of pulmonary embolus.  Notes that do also indicate  other than a paralyzed right diaphragm he does have some left diaphragmatic weakness.  In general the ICU physicians at [facility] felt that he had some degree of an ICU myopathy/neuropathy as a cause for his current respiratory failure.  He was afebrile and hemodynamically stable otherwise.  Lab work is relatively unremarkable, he does have a hemoglobin of 10.5.  Blood cultures sent.  Sputum culture ordered but not sent.  Started empirically on vancomycin and zosyn.  While hospitalized, patient asking for oral fluids- states he drinks them at [facility].  Question some degree of aspiration pneumonia vs HAP.  Given his negative MRSA PCR, vancomycin was stopped.  Aggressive pulmonary toilet continued with chest physiotherapy, bronchodilators, and mucomyst nebs.  Noted to have a pressure wound on left/ lateral back in which our wound care team has made recommendations.  Since admit, patient is now back to his baseline ventilator settings and FiO2.  Since admission, patient has not needed lasix or required midodrine.  Remains hemodynamically stable.  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  He is stable to return to [facility] today.     Discharge Plan by Active Problems    Acute on chronic hypoxemic respiratory failure RLL PNA- HAP vs aspiration PNA Elevated Right hemidiaphragm - continue full support MV- PRVC 15 /550/ 5/ 40 - NPO, do not recommend any oral fluids/ drinking - follow tracheal aspirate and blood cultures  - continue augmentin per PEG  for 5 day completion - continue aggressive pulmonary toilet, BD  Critical illness myopathy - PT/OT  Chronic pressure wound- left/lateral back/ shoulder - per WOC recommendations  - maximize nutrition per TF    Significant Hospital tests/ studies  CT Angio 4/5 with right lower lobe atelectasis/consolidation consistent with mucus plug aspiration pneumonia. Elevated right  hemidiaphragm.  Procedures   PTA portex trach >>  PTA peg >>  Culture data/antimicrobials   4/5 covid 19 and flu negative 4/5 MRSA pcr negative 4/6 BCx2 >> 4/7 trach aspirate  >>  Vancomycin 4/5  Zosyn 4/5>>4/7 Augmentin 4/7>>   Consults  n/a   Discharge Exam: BP (!) 124/47   Pulse 87   Temp 99 F (37.2 C) (Oral)   Resp 20   Ht 5\' 8"  (1.727 m)   Wt 62.3 kg   SpO2 100%   BMI 20.88 kg/m   General:  Chronically ill elderly male on MV in NAD HEENT: MM pink/moist, midline cuffed portex trach Neuro: sleeping prior to, awakens easily, mouths to communicate, f/c, MAE weakly except LUE which is flaccid  CV: rr, no mumur PULM:  Non labored on full MV support, PRVC, coarse throughout, minimal secretions- clear GI: soft, bs +, NT, PEG LUQ site wnl, urine pouch in place  Extremities: warm/dry, no LE edema  Skin: no rashes, left/ lateral shoulder/ back wound not visualized   Labs at discharge   Lab Results  Component Value Date   CREATININE 0.42 (L) 01/27/2020   BUN 34 (H) 01/27/2020   NA 138 01/27/2020   K 3.7 01/27/2020   CL 96 (L) 01/27/2020   CO2 27 01/27/2020   Lab Results  Component Value Date   WBC 12.0 (H) 01/27/2020   HGB 9.8 (L) 01/27/2020   HCT 30.9 (L) 01/27/2020   MCV 99.7 01/27/2020   PLT 170 01/27/2020   Lab Results  Component Value Date   ALT 20 01/26/2020   AST 19 01/26/2020   ALKPHOS 113 01/26/2020   BILITOT 0.6 01/26/2020   No results found for: INR, PROTIME  Current radiological studies    CT Angio Chest PE W and/or Wo Contrast  Result Date: 01/26/2020 CLINICAL DATA:  69 year old male with shortness of breath. EXAM: CT ANGIOGRAPHY CHEST WITH CONTRAST TECHNIQUE: Multidetector CT imaging of the chest was performed using the standard protocol during bolus administration of intravenous contrast. Multiplanar CT image reconstructions and MIPs were obtained to evaluate the vascular anatomy. CONTRAST:  24mL OMNIPAQUE IOHEXOL 350 MG/ML SOLN  COMPARISON:  Chest radiograph dated 01/26/2020. FINDINGS: Cardiovascular: There is no cardiomegaly or pericardial effusion. Coronary vascular calcification primarily involving the LAD. There is mild atherosclerotic calcification of the thoracic aorta. Evaluation of the pulmonary arteries is somewhat limited due to respiratory motion artifact. No pulmonary artery embolus identified. Mediastinum/Nodes: No definite hilar or mediastinal adenopathy. Evaluation however is limited due to consolidative changes of the right lower lobe. The esophagus is grossly unremarkable. No mediastinal fluid collection. Lungs/Pleura: There is eventration of the right hemidiaphragm. There is a large area of consolidative change with air bronchogram involving the right lower lobe and to a lesser degree right middle lobe. There are clusters of nodular density with tree-in-bud appearance in the right upper lobe. Findings most consistent with multifocal pneumonia and possibly related to aspiration. Clinical correlation recommended. There is background of mild centrilobular emphysema. Linear streaky densities as well as faint nodular densities in the left lower lobe also likely infectious in etiology. There is no pleural effusion or pneumothorax.  Mucus secretion noted along the posterior wall of the trachea extending into the mainstem bronchi bilaterally. There is a tracheostomy with tip above the carina. The central airway remain patent. Upper Abdomen: Several hepatic hypodense lesions which are not well characterized on this CT. A gastrostomy is noted. Musculoskeletal: Osteopenia with degenerative changes of the spine. T1-T2 posterior fusion as well as evidence of prior lower thoracic posterior fusion. No acute osseous pathology. Review of the MIP images confirms the above findings. IMPRESSION: 1. No CT evidence of pulmonary artery embolus. 2. Multifocal pneumonia including an area of consolidative change in the right lung base, possibly  related to aspiration. Clinical correlation is recommended. 3. Aortic Atherosclerosis (ICD10-I70.0) and Emphysema (ICD10-J43.9). Electronically Signed   By: Elgie Collard M.D.   On: 01/26/2020 23:36   DG Chest Portable 1 View  Result Date: 01/26/2020 CLINICAL DATA:  Shortness of breath EXAM: PORTABLE CHEST 1 VIEW COMPARISON:  01/26/2020 FINDINGS: Elevation of the right hemidiaphragm. Small right pleural effusion with right base atelectasis. No confluent opacity on the left. Heart is borderline in size. Tracheostomy in place with the tip projecting over the mid trachea. IMPRESSION: Elevation of the right hemidiaphragm. Small right pleural effusion with right base atelectasis. Electronically Signed   By: Charlett Nose M.D.   On: 01/26/2020 20:53    Disposition:    Discharge disposition: [facility] Not Defined     [facility]    Allergies as of 01/28/2020   Not on File     Medication List    STOP taking these medications   Eliquis 5 MG Tabs tablet Generic drug: apixaban   furosemide 40 MG tablet Commonly known as: LASIX   HYDROmorphone 2 MG tablet Commonly known as: DILAUDID Replaced by: HYDROmorphone HCl 1 MG/ML Liqd   midodrine 10 MG tablet Commonly known as: PROAMATINE     TAKE these medications   acetaminophen 325 MG tablet Commonly known as: TYLENOL Place 650 mg into feeding tube every 6 (six) hours.   amoxicillin-clavulanate 400-57 MG/5ML suspension Commonly known as: AUGMENTIN Take 10.9 mLs (875 mg total) by mouth every 12 (twelve) hours.   chlorhexidine 0.12 % solution Commonly known as: PERIDEX Use as directed 15 mLs in the mouth or throat 2 (two) times daily.   Digestive Enzymes Tabs Take 1 tablet by mouth as needed (clogged tube).   DULoxetine 20 MG capsule Commonly known as: CYMBALTA Take 20 mg by mouth every 12 (twelve) hours. G-tube   feeding supplement (PRO-STAT SUGAR FREE 64) Liqd Place 30 mLs into feeding tube  2 (two) times daily.   ferrous sulfate 325 (65 FE) MG tablet Take 325 mg by mouth every other day. G-tube   HYDROmorphone HCl 1 MG/ML Liqd Commonly known as: DILAUDID Place 1 mL (1 mg total) into feeding tube every 4 (four) hours as needed for severe pain. Replaces: HYDROmorphone 2 MG tablet   hydrOXYzine 25 MG tablet Commonly known as: ATARAX/VISTARIL Place 25 mg into feeding tube every 6 (six) hours as needed for anxiety.   ipratropium-albuterol 0.5-2.5 (3) MG/3ML Soln Commonly known as: DUONEB Take 3 mLs by nebulization every 4 (four) hours as needed (respiratory failure).   LORazepam 0.5 MG tablet Commonly known as: ATIVAN Place 0.5 mg into feeding tube daily as needed for anxiety.   melatonin 3 MG Tabs tablet Place 3 mg into feeding tube at bedtime.   Nutrisource Fiber Pack Place 1 packet into feeding tube every 12 (twelve) hours.   pantoprazole  40 MG tablet Commonly known as: PROTONIX Take 40 mg by mouth every 12 (twelve) hours.   polyethylene glycol 17 g packet Commonly known as: MIRALAX / GLYCOLAX Place 17 g into feeding tube daily as needed for mild constipation.   pregabalin 100 MG capsule Commonly known as: LYRICA Place 100 mg into feeding tube every 8 (eight) hours.   senna 8.6 MG Tabs tablet Commonly known as: SENOKOT Place 1 tablet into feeding tube every 12 (twelve) hours.   zolpidem 5 MG tablet Commonly known as: AMBIEN Place 5 mg into feeding tube at bedtime.        Follow-up appointment   N/a  Discharge Condition:    stable   Kennieth Rad, MSN, AGACNP-BC [facility] 01/28/2020, 11:03 AM

## 2020-01-28 NOTE — Evaluation (Signed)
Occupational Therapy Evaluation Patient Details Name: Andrew Rivera MRN: [id number] DOB: Apr 07, 1951 Today's Date: 01/28/2020    History of Present Illness Patient is a 69 year old white male transferred from [facility] with an episode of hypoxemia  He is normally on 40% at [facility] and required 70% FiO2 here at [facility] to bring his oxygen saturations up to an adequate level.  PMH of multiple back surgeries over the last 10 years was admitted to [facility] with MSSA bacteremia and 07/13/2019 and subsequently was readmitted back in November with recurrence of MSSA bacteremia and a prolonged ventilator time ultimately requiring tracheostomy.   Clinical Impression   Pt PTA: Pt from [facility] SN[facility]; L side weakness and has not been OOB x4 weeks. Pt currently tolerating light rolling in bed + 2 modA for repositioning. Pt feeling nauseous and refusing additional therapy intervention. Pt shaking head no for any ADL and light exercise. Pt left with needs in reach. Pt maxA overall for ADL. Pt's VSS on RA. OT to follow acutely.       Follow Up Recommendations  Other (comment)(LTACH)    Equipment Recommendations  Other (comment)(TBD)    Recommendations for Other Services       Precautions / Restrictions Precautions Precautions: Fall;Other (comment) Precaution Comments: chronic vent/trach/PEG Restrictions Weight Bearing Restrictions: No Other Position/Activity Restrictions: L side weakness      Mobility Bed Mobility Overal bed mobility: Needs Assistance   Rolling: Mod assist;+2 for physical assistance         General bed mobility comments: Pt agreeable to rolling in bed; reaching for side rail with RUE  Transfers                 General transfer comment: deferred as pt unwilling to participate with OOB ADL due to nausea    Balance                                           ADL either performed or assessed with clinical judgement   ADL Overall ADL's : At baseline                                     Functional mobility during ADLs: Maximal assistance;+2 for physical assistance General ADL Comments: Pt limited by L sided weakness and poor mobility. Pt requires assist for all ADL tasks as pt fatigues easily.     Vision Baseline Vision/History: Wears glasses Wears Glasses: At all times Vision Assessment?: No apparent visual deficits     Perception     Praxis      Pertinent Vitals/Pain Pain Assessment: Faces Faces Pain Scale: Hurts even more Pain Location: low back Pain Descriptors / Indicators: Discomfort Pain Intervention(s): Monitored during session     Hand Dominance Right   Extremity/Trunk Assessment Upper Extremity Assessment Upper Extremity Assessment: Generalized weakness;RUE deficits/detail;LUE deficits/detail RUE Deficits / Details: AROM limited to about 70 degrees shoulder flexion (AAROM About 80 with pain), strength shoulder flexion 3-/5, elbow flexion 3+/5, grip fair RUE Coordination: decreased fine motor;decreased gross motor LUE Deficits / Details: PROM, intact; pt not tolerating motion at this time LUE Coordination: decreased fine motor;decreased gross motor   Lower Extremity Assessment Lower Extremity Assessment: Defer to PT evaluation;Generalized weakness   Cervical / Trunk Assessment Cervical / Trunk Assessment: Other exceptions Cervical / Trunk Exceptions: multiple  back sxs   Communication Communication Communication: Tracheostomy   Cognition Arousal/Alertness: Awake/alert Behavior During Therapy: WFL for tasks assessed/performed Overall Cognitive Status: Difficult to assess                                 General Comments: mouths words to communicate, seems mostly intact from little communication   General Comments  VCSS on RA; BP 133/55 in supine    Exercises General Exercises - Upper Extremity Shoulder Flexion: AROM;Right;Sidelying   Shoulder Instructions      Home Living  Family/patient expects to be discharged to:: Other (Comment)                                 Additional Comments: [facility]      Prior Functioning/Environment Level of Independence: Needs assistance  Gait / Transfers Assistance Needed: walked with a walker prior to hospitalization in November ADL's / Homemaking Assistance Needed: Requires assist for ADL tasks other than grooming            OT Problem List: Decreased strength;Decreased activity tolerance;Impaired balance (sitting and/or standing);Decreased range of motion;Decreased safety awareness;Pain;Decreased coordination      OT Treatment/Interventions: Self-care/ADL training;Therapeutic exercise;Energy conservation;DME and/or AE instruction;Therapeutic activities;Patient/family education;Balance training;Manual therapy    OT Goals(Current goals can be found in the care plan section) Acute Rehab OT Goals Patient Stated Goal: eager for OOB next session OT Goal Formulation: With patient Time For Goal Achievement: 02/11/20 Potential to Achieve Goals: Good ADL Goals Pt/caregiver will Perform Home Exercise Program: Increased strength;Both right and left upper extremity Additional ADL Goal #1: Pt will tolerate sitting EOB x3 mins with modA for support as a precursor to standing assisted.  OT Frequency: Min 2X/week   Barriers to D/C:            Co-evaluation              AM-PAC OT "6 Clicks" Daily Activity     Outcome Measure Help from another person eating meals?: Total(peg) Help from another person taking care of personal grooming?: A Lot Help from another person toileting, which includes using toliet, bedpan, or urinal?: Total Help from another person bathing (including washing, rinsing, drying)?: A Lot Help from another person to put on and taking off regular upper body clothing?: A Lot Help from another person to put on and taking off regular lower body clothing?: Total 6 Click Score: 9   End  of Session Nurse Communication: Mobility status  Activity Tolerance: Patient limited by pain;Other (comment)(nausea) Patient left: in bed;with call bell/phone within reach  OT Visit Diagnosis: Muscle weakness (generalized) (M62.81);Pain Pain - Right/Left: Left Pain - part of body: Leg                Time: 0950-1005 OT Time Calculation (min): 15 min Charges:  OT General Charges $OT Visit: 1 Visit OT Evaluation $OT Eval Moderate Complexity: 1 Mod  Jefferey Pica, OTR/L Acute Rehabilitation Services Pager: [phone] Office: [phone]   Kathee Tumlin  C 01/28/2020, 2:12 PM

## 2020-01-31 LAB — CULTURE, RESPIRATORY W GRAM STAIN

## 2020-02-01 LAB — CULTURE, BLOOD (ROUTINE X 2)
Culture: NO GROWTH
Culture: NO GROWTH
Special Requests: ADEQUATE
Special Requests: ADEQUATE

## 2020-02-18 DIAGNOSIS — J181 Lobar pneumonia, unspecified organism: Secondary | ICD-10-CM

## 2020-02-18 DIAGNOSIS — R6520 Severe sepsis without septic shock: Secondary | ICD-10-CM

## 2020-02-18 DIAGNOSIS — J9621 Acute and chronic respiratory failure with hypoxia: Secondary | ICD-10-CM

## 2020-02-18 DIAGNOSIS — J986 Disorders of diaphragm: Secondary | ICD-10-CM

## 2020-03-01 DIAGNOSIS — R6520 Severe sepsis without septic shock: Secondary | ICD-10-CM

## 2020-03-01 DIAGNOSIS — J9621 Acute and chronic respiratory failure with hypoxia: Secondary | ICD-10-CM

## 2020-03-01 DIAGNOSIS — J181 Lobar pneumonia, unspecified organism: Secondary | ICD-10-CM | POA: Diagnosis not present

## 2020-03-01 DIAGNOSIS — J986 Disorders of diaphragm: Secondary | ICD-10-CM | POA: Diagnosis not present

## 2020-03-03 DIAGNOSIS — R6520 Severe sepsis without septic shock: Secondary | ICD-10-CM | POA: Diagnosis not present

## 2020-03-03 DIAGNOSIS — J986 Disorders of diaphragm: Secondary | ICD-10-CM

## 2020-03-03 DIAGNOSIS — J181 Lobar pneumonia, unspecified organism: Secondary | ICD-10-CM

## 2020-03-03 DIAGNOSIS — J9621 Acute and chronic respiratory failure with hypoxia: Secondary | ICD-10-CM | POA: Diagnosis not present

## 2020-03-05 DIAGNOSIS — R6521 Severe sepsis with septic shock: Secondary | ICD-10-CM | POA: Diagnosis not present

## 2020-03-05 DIAGNOSIS — J986 Disorders of diaphragm: Secondary | ICD-10-CM

## 2020-03-05 DIAGNOSIS — R6520 Severe sepsis without septic shock: Secondary | ICD-10-CM

## 2020-03-05 DIAGNOSIS — J181 Lobar pneumonia, unspecified organism: Secondary | ICD-10-CM | POA: Diagnosis not present

## 2020-03-05 DIAGNOSIS — J9621 Acute and chronic respiratory failure with hypoxia: Secondary | ICD-10-CM

## 2020-03-15 DIAGNOSIS — J9621 Acute and chronic respiratory failure with hypoxia: Secondary | ICD-10-CM | POA: Diagnosis not present

## 2020-03-15 DIAGNOSIS — J986 Disorders of diaphragm: Secondary | ICD-10-CM

## 2020-03-15 DIAGNOSIS — R6520 Severe sepsis without septic shock: Secondary | ICD-10-CM | POA: Diagnosis not present

## 2020-03-15 DIAGNOSIS — J181 Lobar pneumonia, unspecified organism: Secondary | ICD-10-CM

## 2020-03-17 DIAGNOSIS — J181 Lobar pneumonia, unspecified organism: Secondary | ICD-10-CM

## 2020-03-17 DIAGNOSIS — J9621 Acute and chronic respiratory failure with hypoxia: Secondary | ICD-10-CM | POA: Diagnosis not present

## 2020-03-17 DIAGNOSIS — R6520 Severe sepsis without septic shock: Secondary | ICD-10-CM

## 2020-03-17 DIAGNOSIS — J986 Disorders of diaphragm: Secondary | ICD-10-CM

## 2020-03-30 DIAGNOSIS — R6521 Severe sepsis with septic shock: Secondary | ICD-10-CM

## 2020-03-30 DIAGNOSIS — J986 Disorders of diaphragm: Secondary | ICD-10-CM | POA: Diagnosis not present

## 2020-03-30 DIAGNOSIS — J181 Lobar pneumonia, unspecified organism: Secondary | ICD-10-CM | POA: Diagnosis not present

## 2020-03-30 DIAGNOSIS — J9621 Acute and chronic respiratory failure with hypoxia: Secondary | ICD-10-CM

## 2020-12-24 ENCOUNTER — Other Ambulatory Visit: Payer: Self-pay

## 2020-12-24 ENCOUNTER — Encounter (HOSPITAL_COMMUNITY): Payer: Self-pay

## 2020-12-24 ENCOUNTER — Emergency Department (HOSPITAL_COMMUNITY): Payer: Medicare HMO

## 2020-12-24 ENCOUNTER — Emergency Department (HOSPITAL_COMMUNITY)
Admission: EM | Admit: 2020-12-24 | Discharge: 2020-12-25 | Disposition: A | Discharge status: Home / Self Care | Payer: Medicare HMO | Source: Ambulatory Visit | Attending: Emergency Medicine | Admitting: Emergency Medicine

## 2020-12-24 DIAGNOSIS — Z9911 Dependence on respirator [ventilator] status: Secondary | ICD-10-CM | POA: Insufficient documentation

## 2020-12-24 DIAGNOSIS — R1084 Generalized abdominal pain: Secondary | ICD-10-CM | POA: Diagnosis not present

## 2020-12-24 DIAGNOSIS — Z87891 Personal history of nicotine dependence: Secondary | ICD-10-CM | POA: Insufficient documentation

## 2020-12-24 DIAGNOSIS — K5900 Constipation, unspecified: Secondary | ICD-10-CM

## 2020-12-24 DIAGNOSIS — R140 Abdominal distension (gaseous): Secondary | ICD-10-CM | POA: Diagnosis not present

## 2020-12-24 DIAGNOSIS — J189 Pneumonia, unspecified organism: Secondary | ICD-10-CM

## 2020-12-24 LAB — COMPREHENSIVE METABOLIC PANEL
ALT: 16 U/L (ref 0–44)
AST: 16 U/L (ref 15–41)
Albumin: 3.2 g/dL — ABNORMAL LOW (ref 3.5–5.0)
Alkaline Phosphatase: 84 U/L (ref 38–126)
Anion gap: 15 (ref 5–15)
BUN: 29 mg/dL — ABNORMAL HIGH (ref 8–23)
CO2: 26 mmol/L (ref 22–32)
Calcium: 9.1 mg/dL (ref 8.9–10.3)
Chloride: 96 mmol/L — ABNORMAL LOW (ref 98–111)
Creatinine, Ser: 0.31 mg/dL — ABNORMAL LOW (ref 0.61–1.24)
GFR, Estimated: >60 mL/min (ref >60)
Glucose, Bld: 107 mg/dL — ABNORMAL HIGH (ref 70–99)
Potassium: 3 mmol/L — ABNORMAL LOW (ref 3.5–5.1)
Sodium: 137 mmol/L (ref 135–145)
Total Bilirubin: 0.9 mg/dL (ref 0.3–1.2)
Total Protein: 7.4 g/dL (ref 6.5–8.1)

## 2020-12-24 LAB — CBC WITH DIFFERENTIAL/PLATELET
Abs Immature Granulocytes: 0.07 10*3/uL (ref 0.00–0.07)
Basophils Absolute: 0 10*3/uL (ref 0.0–0.1)
Basophils Relative: 0 %
Eosinophils Absolute: 0.1 10*3/uL (ref 0.0–0.5)
Eosinophils Relative: 1 %
HCT: 40.4 % (ref 39.0–52.0)
Hemoglobin: 12.7 g/dL — ABNORMAL LOW (ref 13.0–17.0)
Immature Granulocytes: 1 %
Lymphocytes Relative: 7 %
Lymphs Abs: 1 10*3/uL (ref 0.7–4.0)
MCH: 30 pg (ref 26.0–34.0)
MCHC: 31.4 g/dL (ref 30.0–36.0)
MCV: 95.5 fL (ref 80.0–100.0)
Monocytes Absolute: 1.1 10*3/uL — ABNORMAL HIGH (ref 0.1–1.0)
Monocytes Relative: 8 %
Neutro Abs: 12 10*3/uL — ABNORMAL HIGH (ref 1.7–7.7)
Neutrophils Relative %: 83 %
Platelets: 339 10*3/uL (ref 150–400)
RBC: 4.23 MIL/uL (ref 4.22–5.81)
RDW: 13.2 % (ref 11.5–15.5)
WBC: 14.4 10*3/uL — ABNORMAL HIGH (ref 4.0–10.5)
nRBC: 0 % (ref 0.0–0.2)

## 2020-12-24 LAB — LIPASE, BLOOD: Lipase: 19 U/L (ref 11–51)

## 2020-12-24 MED ORDER — IOHEXOL 300 MG/ML  SOLN
80.0000 mL | Freq: Once | INTRAMUSCULAR | Status: AC | PRN
  Administered 2020-12-24: 80 mL via INTRAVENOUS

## 2020-12-24 MED ORDER — SODIUM CHLORIDE 0.9 % IV SOLN
1.0000 g | Freq: Once | INTRAVENOUS | Status: AC
  Administered 2020-12-25: 1 g via INTRAVENOUS
  Filled 2020-12-24: qty 1

## 2020-12-24 NOTE — ED Notes (Signed)
Andrew Rivera, [facility], [phone] would like an update when available

## 2020-12-24 NOTE — ED Triage Notes (Signed)
Patient arrives from [facility] with [facility] for concern of bowel obstruction, vent dependent trach patient with g-tube.

## 2020-12-25 DIAGNOSIS — R140 Abdominal distension (gaseous): Secondary | ICD-10-CM | POA: Diagnosis not present

## 2020-12-25 MED ORDER — SODIUM CHLORIDE 0.9 % IV SOLN
1.0000 g | Freq: Once | INTRAVENOUS | Status: AC
  Administered 2020-12-25: 1 g via INTRAVENOUS
  Filled 2020-12-25: qty 1

## 2020-12-25 MED ORDER — VANCOMYCIN HCL 1250 MG/250ML IV SOLN
1250.0000 mg | Freq: Once | INTRAVENOUS | Status: AC
  Administered 2020-12-25: 1250 mg via INTRAVENOUS
  Filled 2020-12-25: qty 250

## 2020-12-25 MED ORDER — MAGNESIUM CITRATE PO SOLN
1.0000 | Freq: Once | ORAL | Status: AC
  Administered 2020-12-25: 1
  Filled 2020-12-25: qty 296

## 2020-12-25 MED ORDER — LORAZEPAM 1 MG PO TABS
0.5000 mg | ORAL_TABLET | Freq: Once | ORAL | Status: AC
  Administered 2020-12-25: 0.5 mg
  Filled 2020-12-25: qty 1

## 2020-12-25 NOTE — Discharge Instructions (Addendum)
Cefepime 2g IV q8 hours  Vancomycin 750mg  IV q12 hours  Continue antibiotics for 7 days to cover for Healthcare Associated Pneumonia (HCAP)

## 2020-12-25 NOTE — ED Provider Notes (Signed)
I received this patient in signout from Dr. Stevie Kern. Briefly, pt w/ multiple medical problems brought from [facility] for abd distension and concern for obstruction on KUB. At signout, pt awaiting CT abd/pelvis.   CT shows large stool burden with dilation of large bowel but no evidence of perforation, abscess, stercoral colitis, or volvulus. Performed DRE w/ chaperone, pt with soft stool passing spontaneously on my exam. No impaction.  His imaging does note bilateral lower lobe consolidations consistent with infection.  I have treated with vancomycin and cefepime to cover for HCAP, added blood cultures. His VS have been reassuring here, stable on vent settings, and no distress on exam. I spoke w/ overnight nurse Deanna Artis at [facility]. She reviewed current bowel regimen; I recommended increasing Miralax to TID for several days and have given Mg citrate here to continue bowel cleanout. Regarding abx, she states they can continue IV abx there. PT discharged back to [facility].   IMPRESSION:  1. Increased stool burden within the rectum with a mid rectal stool  ball of 9.5 cm. Associated proximal dilatation of the large bowel  measuring up to 12cm. No definite findings of associated stercoral  colitis. No large bowel volvulus. No definite findings of bowel  ischemia. No associated bowel perforation. Diffuse colonic dilation  may reflect changes of obstruction due to fecal impaction or a  superimposed colonic ileus.  2. Scattered colonic diverticulosis with no acute diverticulitis.  3. Indeterminate right hepatic lobe 1.5 cm hypodensity.  4. Bilateral lower lobe small consolidations with air bronchograms  consistent with infection/inflammation.    Nicolaos Mitrano, Ambrose [place], MD 12/25/20 [phone]

## 2020-12-25 NOTE — ED Notes (Signed)
Attempted x 1 to call report to [facility].

## 2020-12-25 NOTE — ED Notes (Signed)
[facility] called and transport arranged  

## 2020-12-25 NOTE — ED Provider Notes (Signed)
[facility]   CSN: [id number] Arrival date & time: 12/24/20  2047     History Chief Complaint  Patient presents with  . Abdominal Pain    Andrew Rivera is a 70 y.o. male.  Presented to ER with concern for bowel obstruction.  [facility], vent and G-tube dependent.  Per report, patient having increasing abdominal distention, x-ray concerning for obstruction.  HPI     Past Medical History:  Diagnosis Date  . Acute on chronic respiratory failure with hypoxia (HCC)   . Altered mental status, unspecified   . Chronic pain syndrome   . Critical illness myopathy   . Internal jugular (IJ) vein thromboembolism, acute, unspecified laterality (HCC)   . Septic shock [facility](HCC)     Patient Active Problem List   Diagnosis Date Noted  . Pressure injury of skin 01/28/2020  . Acute hypoxemic respiratory failure (HCC) 01/26/2020  . Acute on chronic respiratory failure with hypoxia (HCC)   . Septic shock (HCC)   . Altered mental status, unspecified   . Critical illness myopathy   . Chronic pain syndrome   . Internal jugular (IJ) vein thromboembolism, acute, unspecified laterality (HCC)     History reviewed. No pertinent surgical history.     History reviewed. No pertinent family history.  Social History   Tobacco Use  . Smoking status: Former Smoker    Packs/day: 1.00    Types: Cigarettes    Quit date: 01/25/1990    Years since quitting: 30.9  . Smokeless tobacco: Never Used  Substance Use Topics  . Alcohol use: Not Currently  . Drug use: Never    Home Medications Prior to Admission medications   Medication Sig Start Date End Date Taking? Authorizing Provider  acetaminophen (TYLENOL) 325 MG tablet Place 650 mg into feeding tube every 6 (six) hours.    [provider]  Amino Acids-Protein Hydrolys (FEEDING SUPPLEMENT, PRO-STAT SUGAR FREE 64,) LIQD Place 30 mLs into feeding tube 2 (two)  times daily.    [provider]  amoxicillin-clavulanate (AUGMENTIN) 400-57 MG/5ML suspension Take 10.9 mLs (875 mg total) by mouth every 12 (twelve) hours. 01/28/20   Norton BlizzardSimpson, Paula B, NP  chlorhexidine (PERIDEX) 0.12 % solution Use as directed 15 mLs in the mouth or throat 2 (two) times daily.    [provider]  Digestive Enzymes TABS Take 1 tablet by mouth as needed (clogged tube).    [provider]  DULoxetine (CYMBALTA) 20 MG capsule Take 20 mg by mouth every 12 (twelve) hours. G-tube    [provider]  ferrous sulfate 325 (65 FE) MG tablet Take 325 mg by mouth every other day. G-tube    [provider]  Guar Gum (NUTRISOURCE FIBER) PACK Place 1 packet into feeding tube every 12 (twelve) hours.    [provider]  HYDROmorphone HCl (DILAUDID) 1 MG/ML LIQD Place 1 mL (1 mg total) into feeding tube every 4 (four) hours as needed for severe pain. 01/28/20   Norton BlizzardSimpson, Paula B, NP  hydrOXYzine (ATARAX/VISTARIL) 25 MG tablet Place 25 mg into feeding tube every 6 (six) hours as needed for anxiety.    [provider]  ipratropium-albuterol (DUONEB) 0.5-2.5 (3) MG/3ML SOLN Take 3 mLs by nebulization every 4 (four) hours as needed (respiratory failure).    [provider]  LORazepam (ATIVAN) 0.5 MG tablet Place 0.5 mg into feeding tube daily as needed for anxiety.  [provider]  melatonin 3 MG TABS tablet Place 3 mg into feeding tube at bedtime.    [provider]  pantoprazole (PROTONIX) 40 MG tablet Take 40 mg by mouth every 12 (twelve) hours.    [provider]  polyethylene glycol (MIRALAX / GLYCOLAX) 17 g packet Place 17 g into feeding tube daily as needed for mild constipation.    [provider]  pregabalin (LYRICA) 100 MG capsule Place 100 mg into feeding tube every 8 (eight) hours.    [provider]  senna (SENOKOT) 8.6 MG TABS tablet Place 1 tablet into feeding tube every 12  (twelve) hours.    [provider]  zolpidem (AMBIEN) 5 MG tablet Place 5 mg into feeding tube at bedtime.    [provider]    Allergies    Codeine and Oxycodone-acetaminophen  Review of Systems   Review of Systems  Unable to perform ROS: Patient nonverbal    Physical Exam Updated Vital Signs BP (!) 148/69   Pulse 87   Temp 98.9 F (37.2 C) (Oral)   Resp 19   Ht 5\' 8"  (1.727 m)   Wt 62.3 kg   SpO2 99%   BMI 20.88 kg/m   Physical Exam Vitals and nursing note reviewed.  Constitutional:      Comments: Trach vent dependent  HENT:     Head: Normocephalic and atraumatic.  Cardiovascular:     Rate and Rhythm: Normal rate.  Pulmonary:     Effort: Pulmonary effort is normal.     Comments: Normal breath sounds, on vent Abdominal:     Comments: Somewhat distended, generalized tenderness  Skin:    General: Skin is warm.  Neurological:     Comments: Alert, opens eyes, follows commands, nods yes and no,  Psychiatric:        Mood and Affect: Mood normal.        Behavior: Behavior normal.     ED Results / Procedures / Treatments   Labs (all labs ordered are listed, but only abnormal results are displayed) Labs Reviewed  CBC WITH DIFFERENTIAL/PLATELET - Abnormal; Notable for the following components:      Result Value   WBC 14.4 (*)    Hemoglobin 12.7 (*)    Neutro Abs 12.0 (*)    Monocytes Absolute 1.1 (*)    All other components within normal limits  COMPREHENSIVE METABOLIC PANEL - Abnormal; Notable for the following components:   Potassium 3.0 (*)    Chloride 96 (*)    Glucose, Bld 107 (*)    BUN 29 (*)    Creatinine, Ser 0.31 (*)    Albumin 3.2 (*)    All other components within normal limits  URINE CULTURE  CULTURE, BLOOD (ROUTINE X 2)  CULTURE, BLOOD (ROUTINE X 2)  LIPASE, BLOOD  URINALYSIS, ROUTINE W REFLEX MICROSCOPIC    EKG None  Radiology CT ABDOMEN PELVIS W CONTRAST  Result Date: 12/25/2020 CLINICAL DATA:  Bowel obstruction  suspected. Presenting with abdominal pain. Possible pneumonia on chest x-ray. EXAM: CT ABDOMEN AND PELVIS WITH CONTRAST TECHNIQUE: Multidetector CT imaging of the abdomen and pelvis was performed using the standard protocol following bolus administration of intravenous contrast. CONTRAST:  51mL OMNIPAQUE IOHEXOL 300 MG/ML  SOLN COMPARISON:  None. FINDINGS: Lower chest: Right lower lobe consolidation with air bronchograms. Similar findings to lesser extent within the left lower lobe. Hepatobiliary: Subcentimeter hypodensities are too small to characterize. Indeterminate right hepatic lobe 1.5 cm hypodensity. Focal liver  abnormality. No gallstones, gallbladder wall thickening, or pericholecystic fluid. No biliary dilatation. Pancreas: No focal lesion. Normal pancreatic contour. No surrounding inflammatory changes. No main pancreatic ductal dilatation. Spleen: Normal in size without focal abnormality. Adrenals/Urinary Tract: No adrenal nodule bilaterally. Bilateral kidneys enhance symmetrically. No hydronephrosis. No hydroureter. The urinary bladder is unremarkable. Stomach/Bowel: Gastrostomy tube terminates within the gastric lumen. Stomach is within normal limits. No evidence of bowel wall thickening or dilatation. Scattered colonic diverticulosis with no acute diverticulitis. The cecum is mobile and medialized. Increased stool burden within the rectum with a mid rectal stool ball of 9.5 cm. Associated proximal dilatation with stool and gas of the large bowel measuring up to 12 cm on axial imaging. No pneumatosis or bowel wall thickening identified. The appendix not definitely identified. Vascular/Lymphatic: No abdominal aorta or iliac aneurysm. Mild atherosclerotic plaque of the aorta and its branches. No abdominal, pelvic, or inguinal lymphadenopathy. Reproductive: Prostate is unremarkable. Other: No intraperitoneal free fluid. No intraperitoneal free gas. No organized fluid collection. Musculoskeletal: Right  anterior abdominal wall hernia repair with several tacks noted. No recurrent hernia identified. Diffusely decreased bone density. Several thoracolumbar levels demonstrate removal of surgical hardware. Several pieces of surgical hardware remains status post sacroiliac joint and lumbar surgical changes. No suspicious lytic or blastic osseous lesions. No acute displaced fracture. Multilevel severe degenerative changes of the spine. IMPRESSION: 1. Increased stool burden within the rectum with a mid rectal stool ball of 9.5 cm. Associated proximal dilatation of the large bowel measuring up to 12cm. No definite findings of associated stercoral colitis. No large bowel volvulus. No definite findings of bowel ischemia. No associated bowel perforation. Diffuse colonic dilation may reflect changes of obstruction due to fecal impaction or a superimposed colonic ileus. 2. Scattered colonic diverticulosis with no acute diverticulitis. 3. Indeterminate right hepatic lobe 1.5 cm hypodensity. 4. Bilateral lower lobe small consolidations with air bronchograms consistent with infection/inflammation. Electronically Signed   By: Tish Frederickson M.D.   On: 12/25/2020 00:07   DG Chest Portable 1 View  Result Date: 12/24/2020 CLINICAL DATA:  Possible pneumonia EXAM: PORTABLE CHEST 1 VIEW COMPARISON:  01/26/2020 FINDINGS: Tracheostomy tube is in place. Partially visualized cervicothoracic hardware. Chronic elevation right diaphragm. Possible small pleural effusions. Bibasilar airspace disease. Stable cardiomediastinal silhouette. No pneumothorax. Air distended bowel in the upper abdomen. IMPRESSION: Bibasilar airspace disease concerning for pneumonia. Possible small pleural effusions. Electronically Signed   By: Jasmine Pang M.D.   On: 12/24/2020 23:07    Procedures Procedures   Medications Ordered in ED Medications  ceFEPIme (MAXIPIME) 1 g in sodium chloride 0.9 % 100 mL IVPB (has no administration in time range)  iohexol  (OMNIPAQUE) 300 MG/ML solution 80 mL (80 mLs Intravenous Contrast Given 12/24/20 2337)    ED Course  I have reviewed the triage vital signs and the nursing notes.  Pertinent labs & imaging results that were available during my care of the patient were reviewed by me and considered in my medical decision making (see chart for details).    MDM Rules/Calculators/A&P                         70 year old gentleman who is trach/vent/G-tube dependent presents to ER with concern for bowel obstruction.  Will obtain basic labs, check CT scan of his abdomen pelvis to further evaluate.  Labs with mild leukocytosis.  Will also check urinalysis and chest x-ray for infectious etiology.  CXR with possible pneumonia.  Will start on  antibiotics for HCAP.  While awaiting CT, patient signed out to Dr. Clarene Duke.  Final Clinical Impression(s) / ED Diagnoses Final diagnoses:  Abdominal distention    Rx / DC Orders ED Discharge Orders    None       Milagros Loll, MD 12/25/20 [phone]

## 2020-12-30 LAB — CULTURE, BLOOD (ROUTINE X 2)
Culture: NO GROWTH
Culture: NO GROWTH
Special Requests: ADEQUATE

## 2021-04-23 ENCOUNTER — Inpatient Hospital Stay (HOSPITAL_COMMUNITY)
Admission: EM | Admit: 2021-04-23 | Discharge: 2021-06-09 | DRG: 004 | Disposition: A | Discharge status: Other Acute Inpatient Hospital | Payer: Medicare HMO | Source: Skilled Nursing Facility | Attending: Internal Medicine | Admitting: Internal Medicine

## 2021-04-23 ENCOUNTER — Emergency Department (HOSPITAL_COMMUNITY): Payer: Medicare HMO

## 2021-04-23 ENCOUNTER — Encounter (HOSPITAL_COMMUNITY): Payer: Self-pay | Admitting: Emergency Medicine

## 2021-04-23 ENCOUNTER — Other Ambulatory Visit: Payer: Self-pay

## 2021-04-23 DIAGNOSIS — G729 Myopathy, unspecified: Secondary | ICD-10-CM

## 2021-04-23 DIAGNOSIS — J9611 Chronic respiratory failure with hypoxia: Secondary | ICD-10-CM

## 2021-04-23 DIAGNOSIS — Z7401 Bed confinement status: Secondary | ICD-10-CM

## 2021-04-23 DIAGNOSIS — G7281 Critical illness myopathy: Secondary | ICD-10-CM | POA: Diagnosis not present

## 2021-04-23 DIAGNOSIS — M545 Low back pain, unspecified: Secondary | ICD-10-CM | POA: Diagnosis not present

## 2021-04-23 DIAGNOSIS — Z931 Gastrostomy status: Secondary | ICD-10-CM

## 2021-04-23 DIAGNOSIS — K567 Ileus, unspecified: Secondary | ICD-10-CM | POA: Diagnosis present

## 2021-04-23 DIAGNOSIS — R532 Functional quadriplegia: Secondary | ICD-10-CM | POA: Diagnosis not present

## 2021-04-23 DIAGNOSIS — I959 Hypotension, unspecified: Secondary | ICD-10-CM | POA: Diagnosis not present

## 2021-04-23 DIAGNOSIS — G4700 Insomnia, unspecified: Secondary | ICD-10-CM | POA: Diagnosis present

## 2021-04-23 DIAGNOSIS — G894 Chronic pain syndrome: Secondary | ICD-10-CM | POA: Diagnosis present

## 2021-04-23 DIAGNOSIS — L299 Pruritus, unspecified: Secondary | ICD-10-CM | POA: Diagnosis present

## 2021-04-23 DIAGNOSIS — T402X5A Adverse effect of other opioids, initial encounter: Secondary | ICD-10-CM | POA: Diagnosis not present

## 2021-04-23 DIAGNOSIS — E778 Other disorders of glycoprotein metabolism: Secondary | ICD-10-CM | POA: Diagnosis present

## 2021-04-23 DIAGNOSIS — R109 Unspecified abdominal pain: Secondary | ICD-10-CM | POA: Diagnosis present

## 2021-04-23 DIAGNOSIS — Z9911 Dependence on respirator [ventilator] status: Secondary | ICD-10-CM

## 2021-04-23 DIAGNOSIS — J9610 Chronic respiratory failure, unspecified whether with hypoxia or hypercapnia: Secondary | ICD-10-CM

## 2021-04-23 DIAGNOSIS — J986 Disorders of diaphragm: Secondary | ICD-10-CM | POA: Diagnosis present

## 2021-04-23 DIAGNOSIS — G629 Polyneuropathy, unspecified: Secondary | ICD-10-CM | POA: Diagnosis not present

## 2021-04-23 DIAGNOSIS — G901 Familial dysautonomia [Riley-Day]: Secondary | ICD-10-CM | POA: Diagnosis not present

## 2021-04-23 DIAGNOSIS — Z930 Tracheostomy status: Secondary | ICD-10-CM | POA: Diagnosis not present

## 2021-04-23 DIAGNOSIS — Z87891 Personal history of nicotine dependence: Secondary | ICD-10-CM

## 2021-04-23 DIAGNOSIS — Z6829 Body mass index (BMI) 29.0-29.9, adult: Secondary | ICD-10-CM

## 2021-04-23 DIAGNOSIS — E162 Hypoglycemia, unspecified: Secondary | ICD-10-CM | POA: Diagnosis present

## 2021-04-23 DIAGNOSIS — Z20822 Contact with and (suspected) exposure to covid-19: Secondary | ICD-10-CM | POA: Diagnosis present

## 2021-04-23 DIAGNOSIS — E876 Hypokalemia: Secondary | ICD-10-CM

## 2021-04-23 DIAGNOSIS — L600 Ingrowing nail: Secondary | ICD-10-CM | POA: Diagnosis present

## 2021-04-23 DIAGNOSIS — J9601 Acute respiratory failure with hypoxia: Secondary | ICD-10-CM

## 2021-04-23 DIAGNOSIS — G709 Myoneural disorder, unspecified: Secondary | ICD-10-CM | POA: Diagnosis not present

## 2021-04-23 DIAGNOSIS — F39 Unspecified mood [affective] disorder: Secondary | ICD-10-CM | POA: Diagnosis present

## 2021-04-23 DIAGNOSIS — G839 Paralytic syndrome, unspecified: Secondary | ICD-10-CM | POA: Diagnosis present

## 2021-04-23 DIAGNOSIS — Z885 Allergy status to narcotic agent status: Secondary | ICD-10-CM

## 2021-04-23 DIAGNOSIS — A419 Sepsis, unspecified organism: Secondary | ICD-10-CM

## 2021-04-23 DIAGNOSIS — E8809 Other disorders of plasma-protein metabolism, not elsewhere classified: Secondary | ICD-10-CM | POA: Diagnosis present

## 2021-04-23 DIAGNOSIS — D6489 Other specified anemias: Secondary | ICD-10-CM | POA: Diagnosis present

## 2021-04-23 DIAGNOSIS — Z79899 Other long term (current) drug therapy: Secondary | ICD-10-CM

## 2021-04-23 DIAGNOSIS — D638 Anemia in other chronic diseases classified elsewhere: Secondary | ICD-10-CM | POA: Diagnosis not present

## 2021-04-23 DIAGNOSIS — J9612 Chronic respiratory failure with hypercapnia: Secondary | ICD-10-CM | POA: Diagnosis not present

## 2021-04-23 DIAGNOSIS — Z8701 Personal history of pneumonia (recurrent): Secondary | ICD-10-CM

## 2021-04-23 DIAGNOSIS — E43 Unspecified severe protein-calorie malnutrition: Secondary | ICD-10-CM | POA: Diagnosis not present

## 2021-04-23 DIAGNOSIS — F419 Anxiety disorder, unspecified: Secondary | ICD-10-CM | POA: Diagnosis present

## 2021-04-23 DIAGNOSIS — K5900 Constipation, unspecified: Secondary | ICD-10-CM

## 2021-04-23 DIAGNOSIS — R140 Abdominal distension (gaseous): Secondary | ICD-10-CM

## 2021-04-23 LAB — CBC WITH DIFFERENTIAL/PLATELET
Abs Immature Granulocytes: 0.02 10*3/uL (ref 0.00–0.07)
Basophils Absolute: 0 10*3/uL (ref 0.0–0.1)
Basophils Relative: 0 %
Eosinophils Absolute: 0.3 10*3/uL (ref 0.0–0.5)
Eosinophils Relative: 4 %
HCT: 33.4 % — ABNORMAL LOW (ref 39.0–52.0)
Hemoglobin: 10.7 g/dL — ABNORMAL LOW (ref 13.0–17.0)
Immature Granulocytes: 0 %
Lymphocytes Relative: 20 %
Lymphs Abs: 1.4 10*3/uL (ref 0.7–4.0)
MCH: 29.6 pg (ref 26.0–34.0)
MCHC: 32 g/dL (ref 30.0–36.0)
MCV: 92.5 fL (ref 80.0–100.0)
Monocytes Absolute: 0.7 10*3/uL (ref 0.1–1.0)
Monocytes Relative: 10 %
Neutro Abs: 4.6 10*3/uL (ref 1.7–7.7)
Neutrophils Relative %: 66 %
Platelets: 224 10*3/uL (ref 150–400)
RBC: 3.61 MIL/uL — ABNORMAL LOW (ref 4.22–5.81)
RDW: 14.1 % (ref 11.5–15.5)
WBC: 7.1 10*3/uL (ref 4.0–10.5)
nRBC: 0 % (ref 0.0–0.2)

## 2021-04-23 LAB — COMPREHENSIVE METABOLIC PANEL
ALT: 9 U/L (ref 0–44)
AST: 11 U/L — ABNORMAL LOW (ref 15–41)
Albumin: 2.2 g/dL — ABNORMAL LOW (ref 3.5–5.0)
Alkaline Phosphatase: 75 U/L (ref 38–126)
Anion gap: 5 (ref 5–15)
BUN: 8 mg/dL (ref 8–23)
CO2: 24 mmol/L (ref 22–32)
Calcium: 8.3 mg/dL — ABNORMAL LOW (ref 8.9–10.3)
Chloride: 116 mmol/L — ABNORMAL HIGH (ref 98–111)
Creatinine, Ser: <0.3 mg/dL — ABNORMAL LOW (ref 0.61–1.24)
Glucose, Bld: 85 mg/dL (ref 70–99)
Potassium: 2.5 mmol/L — CL (ref 3.5–5.1)
Sodium: 145 mmol/L (ref 135–145)
Total Bilirubin: 0.7 mg/dL (ref 0.3–1.2)
Total Protein: 5.6 g/dL — ABNORMAL LOW (ref 6.5–8.1)

## 2021-04-23 LAB — MAGNESIUM: Magnesium: 1.8 mg/dL (ref 1.7–2.4)

## 2021-04-23 LAB — LIPASE, BLOOD: Lipase: 18 U/L (ref 11–51)

## 2021-04-23 MED ORDER — POTASSIUM CHLORIDE 10 MEQ/100ML IV SOLN
10.0000 meq | INTRAVENOUS | Status: AC
  Administered 2021-04-23 – 2021-04-24 (×3): 10 meq via INTRAVENOUS
  Filled 2021-04-23 (×3): qty 100

## 2021-04-23 MED ORDER — IOHEXOL 300 MG/ML  SOLN
100.0000 mL | Freq: Once | INTRAMUSCULAR | Status: AC | PRN
  Administered 2021-04-23: 100 mL via INTRAVENOUS

## 2021-04-23 NOTE — H&P (Addendum)
NAME:  Andrew Rivera, MRN:  [id number], DOB:  July 27, 1951, LOS: 0 ADMISSION DATE:  04/23/2021, CONSULTATION DATE:  04/24/21 REFERRING MD:  ED, CHIEF COMPLAINT:  abdominal pain   History of Present Illness:  70 yo man with hx of chronic trach, chronic vent, hx of constipation issues and colonic dilation, Hx of PNA, critical illness myopathy, here with increasing abdominal distention and abdominal pain.  Pain waxes and wanes.    Constipation, ileus.  Recently in ED 12/2020 for constipation, disimpacted.    Hypokalemia  In ED, received 1L NS, KCL so far.   Pertinent  Medical History  Chronic pain IJ thromboembolism  Hx of septic shock Chronic respiratory failure.  Recently in ED 3/22 for constipation, colonic dilation (fecal impaction vs ileus), BLL pna,  Hx of critical illness myopathy  Hx of spinal surgeries  Admitted in 06/2019 and 08/2019 with MSSA bacteriemia, tracheostomy done then.  Hx of pressure wound  Former smoker  Tylenol, cymbalta, feso4, dilaudid, duonebs, ativan, melatonin, protonix, miralax, lyrica, senna, ambien  Significant Hospital Events: Including procedures, antibiotic start and stop dates in addition to other pertinent events     Interim History / Subjective:    Objective   Blood pressure (!) 143/55, pulse 65, temperature 98.3 F (36.8 C), temperature source Oral, resp. rate 13, height 5\' 8"  (1.727 m), SpO2 97 %.    Vent Mode: PRVC FiO2 (%):  [28 %] 28 % Set Rate:  [16 bmp] 16 bmp Vt Set:  [540 mL] 540 mL PEEP:  [5 cmH20] 5 cmH20 Plateau Pressure:  [17 cmH20-19 cmH20] 19 cmH20  No intake or output data in the 24 hours ending 04/23/21 2356 There were no vitals filed for this visit.  Examination: General: Pleasant, awake alert  HENT: MMM< trach in place  Lungs: CTAB Cardiovascular: RRR no mgr  Abdomen: distended, diffusely mildly tender, reduced bowel sounds  Extremities: trace edema, pitting in BLE no erythema Neuro: A and O  GU: foley from  [facility] Problem list     Assessment & Plan:  Colonic ileus: worsening distention for past weeks, increasing waxing and waning pain.  Will admit for pain control, NPO, cont miralax.  Consider GI consult in AM for ongoing work up .  Was seen in ED for impacted stool, severe constipation in 3/22, suggesting this has been going on for months.   I dont see a lot of stool in colon, consider enema if no bm.    Chronic resp failure on vent: cont vent settings.  Stable on current settings.    Hypokalemia - received KCL, cont maint fluids with Kcl.   Hypoalbuminemia, hypoproteinemia   Diet/type: NPO DVT prophylaxis: LMWH GI prophylaxis: PPI Lines: N/A Foley:  N/A Code Status:  full code Last date of multidisciplinary goals of care discussion []   Labs   CBC: Recent Labs  Lab 04/23/21 2020  WBC 7.1  NEUTROABS 4.6  HGB 10.7*  HCT 33.4*  MCV 92.5  PLT 224    Basic Metabolic Panel: Recent Labs  Lab 04/23/21 2020  NA 145  K 2.5*  CL 116*  CO2 24  GLUCOSE 85  BUN 8  CREATININE <0.30*  CALCIUM 8.3*  MG 1.8   GFR: CrCl cannot be calculated (This lab value cannot be used to calculate CrCl because it is not a number: <0.30). Recent Labs  Lab 04/23/21 2020  WBC 7.1    Liver Function Tests: Recent Labs  Lab 04/23/21 2020  AST 11*  ALT 9  ALKPHOS 75  BILITOT 0.7  PROT 5.6*  ALBUMIN 2.2*   Recent Labs  Lab 04/23/21 2020  LIPASE 18   No results for input(s): AMMONIA in the last 168 hours.  ABG No results found for: PHART, PCO2ART, PO2ART, HCO3, TCO2, ACIDBASEDEF, O2SAT   Coagulation Profile: No results for input(s): INR, PROTIME in the last 168 hours.  Cardiac Enzymes: No results for input(s): CKTOTAL, CKMB, CKMBINDEX, TROPONINI in the last 168 hours.  HbA1C: No results found for: HGBA1C  CBG: No results for input(s): GLUCAP in the last 168 hours.  CT abd pelvis:  IMPRESSION: 1. Diffuse colonic distension with air and fluid,  suggesting colonic ileus. No evidence of obstruction or perforation. There is mild rectal wall thickening which may be related to proctitis. 2. Asymmetric enlargement of the left lateral abdominal wall musculature. This may represent asymmetric edema, however the possibility of lateral rectus sheath hematoma is raised. 3. Small left pleural effusion is new from prior exam. Trace right pleural effusion. Increase in bibasilar atelectasis/consolidation. 4. Foley catheter decompresses the urinary bladder, wall thickening may be related to nondistention.  Review of Systems:   Review of Systems  Constitutional:  Negative for fever.  HENT:  Negative for hearing loss.   Eyes:  Negative for blurred vision.  Respiratory:  Negative for cough.   Cardiovascular:  Negative for chest pain.  Gastrointestinal:  Negative for heartburn.  Genitourinary:  Negative for dysuria.  Musculoskeletal:  Negative for myalgias.  Skin:  Negative for rash.  Neurological:  Negative for dizziness.  Endo/Heme/Allergies:  Does not bruise/bleed easily.  Psychiatric/Behavioral:  Negative for depression.     Past Medical History:  He,  has a past medical history of Acute on chronic respiratory failure with hypoxia (HCC), Altered mental status, unspecified, Chronic pain syndrome, Critical illness myopathy, Internal jugular (IJ) vein thromboembolism, acute, unspecified laterality (HCC), and Septic shock (HCC).   Surgical History:  History reviewed. No pertinent surgical history.   Social History:   reports that he quit smoking about 31 years ago. His smoking use included cigarettes. He smoked an average of 1.00 packs per day. He has never used smokeless tobacco. He reports previous alcohol use. He reports that he does not use drugs.   Family History:  His family history is not on file.   Allergies Allergies  Allergen Reactions   Codeine Nausea Only   Oxycodone-Acetaminophen Nausea Only    Other reaction(s):  Hallucinations     Home Medications  Prior to Admission medications   Medication Sig Start Date End Date Taking? Authorizing Provider  acetaminophen (TYLENOL) 325 MG tablet Place 650 mg into feeding tube every 6 (six) hours.    [provider]  Amino Acids-Protein Hydrolys (FEEDING SUPPLEMENT, PRO-STAT SUGAR FREE 64,) LIQD Place 30 mLs into feeding tube 2 (two) times daily.    [provider]  amoxicillin-clavulanate (AUGMENTIN) 400-57 MG/5ML suspension Take 10.9 mLs (875 mg total) by mouth every 12 (twelve) hours. 01/28/20   Norton Blizzard, NP  chlorhexidine (PERIDEX) 0.12 % solution Use as directed 15 mLs in the mouth or throat 2 (two) times daily.    [provider]  Digestive Enzymes TABS Take 1 tablet by mouth as needed (clogged tube).    [provider]  DULoxetine (CYMBALTA) 20 MG capsule Take 20 mg by mouth every 12 (twelve) hours. G-tube    [provider]  ferrous sulfate 325 (65 FE) MG tablet Take 325 mg by  mouth every other day. G-tube    [provider]  Guar Gum (NUTRISOURCE FIBER) PACK Place 1 packet into feeding tube every 12 (twelve) hours.    [provider]  HYDROmorphone HCl (DILAUDID) 1 MG/ML LIQD Place 1 mL (1 mg total) into feeding tube every 4 (four) hours as needed for severe pain. 01/28/20   Norton Blizzard, NP  hydrOXYzine (ATARAX/VISTARIL) 25 MG tablet Place 25 mg into feeding tube every 6 (six) hours as needed for anxiety.    [provider]  ipratropium-albuterol (DUONEB) 0.5-2.5 (3) MG/3ML SOLN Take 3 mLs by nebulization every 4 (four) hours as needed (respiratory failure).    [provider]  LORazepam (ATIVAN) 0.5 MG tablet Place 0.5 mg into feeding tube daily as needed for anxiety.    [provider]  melatonin 3 MG TABS tablet Place 3 mg into feeding tube at bedtime.    [provider]  pantoprazole (PROTONIX) 40 MG tablet Take 40 mg by mouth every 12 (twelve) hours.     [provider]  polyethylene glycol (MIRALAX / GLYCOLAX) 17 g packet Place 17 g into feeding tube daily as needed for mild constipation.    [provider]  pregabalin (LYRICA) 100 MG capsule Place 100 mg into feeding tube every 8 (eight) hours.    [provider]  senna (SENOKOT) 8.6 MG TABS tablet Place 1 tablet into feeding tube every 12 (twelve) hours.    [provider]  zolpidem (AMBIEN) 5 MG tablet Place 5 mg into feeding tube at bedtime.    [provider]     Critical care time: 60 minutes

## 2021-04-23 NOTE — ED Notes (Addendum)
Date and time results received: 04/23/21 21:36  Test: Potassium  Critical Value: 2.5  Name of Provider Notified: Dr. Silverio Lay

## 2021-04-23 NOTE — ED Triage Notes (Signed)
Patient BIB [facility] from [facility]. Complaint of constipation and possible ileus that they have been following since May. Staff endorses increased pain however patient denies. VSS. NAD.

## 2021-04-23 NOTE — ED Provider Notes (Signed)
[facility] EMERGENCY DEPARTMENT Provider Note   CSN: [id number] Arrival date & time: 04/23/21  1754     History Chief Complaint  Patient presents with   Constipation    Andrew Rivera is a 70 y.o. male.   Constipation Associated symptoms: no diarrhea, no dysuria, no fever, no nausea and no vomiting    70yoM pmhx tracheostomy tube w/ vent dependence, PEG tube, prior IJ thromboembolism, chronic pain syndrome, BIB [facility] for constipation w/ abominal distension, mild abdominal discomfort. Reports has had worsened constipation last few weeks, no significant improvement w/ miralax. Last BM 3d ago. Seen for similar 12/3020, had some large bowel dilation, but no impaction or obstruction. No further medical concern at this time, including fevers, chills, CP, SOB, NVD, dysuria, hematuria.     Past Medical History:  Diagnosis Date   Acute on chronic respiratory failure with hypoxia (HCC)    Altered mental status, unspecified    Chronic pain syndrome    Critical illness myopathy    Internal jugular (IJ) vein thromboembolism, acute, unspecified laterality (HCC)    Septic shock [facility])     Patient Active Problem List   Diagnosis Date Noted   Ileus (HCC) 04/24/2021   Pressure injury of skin 01/28/2020   Acute hypoxemic respiratory failure (HCC) 01/26/2020   Acute on chronic respiratory failure with hypoxia (HCC)    Septic shock (HCC)    Altered mental status, unspecified    Critical illness myopathy    Chronic pain syndrome    Internal jugular (IJ) vein thromboembolism, acute, unspecified laterality (HCC)     History reviewed. No pertinent surgical history.     No family history on file.  Social History   Tobacco Use   Smoking status: Former    Packs/day: 1.00    Pack years: 0.00    Types: Cigarettes    Quit date: 01/25/1990    Years since quitting: 31.2   Smokeless tobacco: Never  Substance Use Topics   Alcohol use: Not Currently   Drug use: Never     Home Medications Prior to Admission medications   Medication Sig Start Date End Date Taking? Authorizing Provider  acetaminophen (TYLENOL) 325 MG tablet Place 650 mg into feeding tube every 6 (six) hours.    [provider]  Amino Acids-Protein Hydrolys (FEEDING SUPPLEMENT, PRO-STAT SUGAR FREE 64,) LIQD Place 30 mLs into feeding tube 2 (two) times daily.    [provider]  amoxicillin-clavulanate (AUGMENTIN) 400-57 MG/5ML suspension Take 10.9 mLs (875 mg total) by mouth every 12 (twelve) hours. 01/28/20   Norton Blizzard, NP  chlorhexidine (PERIDEX) 0.12 % solution Use as directed 15 mLs in the mouth or throat 2 (two) times daily.    [provider]  Digestive Enzymes TABS Take 1 tablet by mouth as needed (clogged tube).    [provider]  ferrous sulfate 325 (65 FE) MG tablet Take 325 mg by mouth every other day. G-tube    [provider]  Guar Gum (NUTRISOURCE FIBER) PACK Place 1 packet into feeding tube every 12 (twelve) hours.    [provider]  HYDROmorphone HCl (DILAUDID) 1 MG/ML LIQD Place 1 mL (1 mg total) into feeding tube every 4 (four) hours as needed for severe pain. 01/28/20   Norton Blizzard, NP  hydrOXYzine (ATARAX/VISTARIL) 25 MG tablet Place 25 mg into feeding tube every 6 (six) hours as needed for anxiety.    [provider]  ipratropium-albuterol (DUONEB) 0.5-2.5 (3) MG/3ML  SOLN Take 3 mLs by nebulization every 4 (four) hours as needed (respiratory failure).    [provider]  LORazepam (ATIVAN) 0.5 MG tablet Place 0.5 mg into feeding tube daily as needed for anxiety.    [provider]  pantoprazole (PROTONIX) 40 MG tablet Take 40 mg by mouth every 12 (twelve) hours.    [provider]  polyethylene glycol (MIRALAX / GLYCOLAX) 17 g packet Place 17 g into feeding tube daily as needed for mild constipation.    [provider]  pregabalin (LYRICA) 100 MG capsule Place 100 mg  into feeding tube every 8 (eight) hours.    [provider]  senna (SENOKOT) 8.6 MG TABS tablet Place 1 tablet into feeding tube every 12 (twelve) hours.    [provider]  zolpidem (AMBIEN) 5 MG tablet Place 5 mg into feeding tube at bedtime.    [provider]    Allergies    Codeine and Oxycodone-acetaminophen  Review of Systems   Review of Systems  Constitutional:  Negative for chills and fever.  HENT:  Negative for congestion, rhinorrhea and sore throat.   Eyes:  Negative for photophobia, discharge and redness.  Respiratory:  Negative for cough and shortness of breath.   Cardiovascular:  Negative for chest pain and palpitations.  Gastrointestinal:  Positive for abdominal distention and constipation. Negative for diarrhea, nausea and vomiting.  Genitourinary:  Negative for dysuria and hematuria.  Musculoskeletal:  Negative for joint swelling and neck stiffness.  Skin:  Negative for rash and wound.  Neurological:  Negative for seizures, syncope and headaches.  Psychiatric/Behavioral:  Negative for agitation and confusion.    Physical Exam Updated Vital Signs BP (!) 141/64   Pulse 68   Temp 98.3 F (36.8 C) (Oral)   Resp (!) 9   Ht  (1.727 m)   SpO2 96%   BMI 20.88 kg/m   Physical Exam Vitals and nursing note reviewed.  Constitutional:      General: He is not in acute distress.    Appearance: He is not ill-appearing.  HENT:     Head: Normocephalic and atraumatic.     Right Ear: External ear normal.     Left Ear: External ear normal.     Nose: Nose normal.     Mouth/Throat:     Mouth: Mucous membranes are moist.     Pharynx: Oropharynx is clear.  Eyes:     General: No scleral icterus.    Extraocular Movements: Extraocular movements intact.     Conjunctiva/sclera: Conjunctivae normal.     Pupils: Pupils are equal, round, and reactive to light.  Neck:     Comments: Tracheostomy in place, no surrounding erythema, induration,  purulence, fluctuance Cardiovascular:     Rate and Rhythm: Normal rate and regular rhythm.     Heart sounds: No murmur heard.   No friction rub. No gallop.  Pulmonary:     Breath sounds: No stridor. No wheezing, rhonchi or rales.     Comments: Mechanical breath sounds Abdominal:     General: There is distension.     Tenderness: There is no abdominal tenderness. There is no guarding or rebound.     Comments: Scattered hyperactive high-pitched bowel sounds, w/ areas of hypoactive sounds. PEG tube in place  Musculoskeletal:        General: No deformity or signs of injury.     Cervical back: Normal range of motion. No rigidity.     Right lower leg:  No edema.     Left lower leg: No edema.  Skin:    General: Skin is warm and dry.  Neurological:     Mental Status: He is alert and oriented to person, place, and time. Mental status is at baseline.     Comments: Diffuse motor weakness  Psychiatric:        Mood and Affect: Mood normal.        Behavior: Behavior normal.    ED Results / Procedures / Treatments   Labs (all labs ordered are listed, but only abnormal results are displayed) Labs Reviewed  CBC WITH DIFFERENTIAL/PLATELET - Abnormal; Notable for the following components:      Result Value   RBC 3.61 (*)    Hemoglobin 10.7 (*)    HCT 33.4 (*)    All other components within normal limits  COMPREHENSIVE METABOLIC PANEL - Abnormal; Notable for the following components:   Potassium 2.5 (*)    Chloride 116 (*)    Creatinine, Ser <0.30 (*)    Calcium 8.3 (*)    Total Protein 5.6 (*)    Albumin 2.2 (*)    AST 11 (*)    All other components within normal limits  LIPASE, BLOOD  MAGNESIUM  URINALYSIS, ROUTINE W REFLEX MICROSCOPIC  HIV ANTIBODY (ROUTINE TESTING W REFLEX)    EKG None  Radiology CT ABDOMEN PELVIS W CONTRAST  Result Date: 04/23/2021 CLINICAL DATA:  Abdominal distension. EXAM: CT ABDOMEN AND PELVIS WITH CONTRAST TECHNIQUE: Multidetector CT imaging of the abdomen  and pelvis was performed using the standard protocol following bolus administration of intravenous contrast. CONTRAST:  OMNIPAQUE IOHEXOL 300 MG/ML  SOLN COMPARISON:  Abdominal CT 12/24/2020 FINDINGS: Lower chest: Majority of the chest is included in the field of view. Tracheostomy tube above the carina. Right upper extremity PICC in the distal SVC. Small left pleural effusion is new from prior exam. Trace right pleural effusion. Increase in bibasilar atelectasis. Bilateral lower lobe air bronchograms. Hepatobiliary: Scattered small low-density lesions in the liver are similar to prior exam, majority too small to characterize but likely cysts. No calcified gallstone or pericholecystic inflammation. No biliary dilatation. Pancreas: Parenchymal atrophy. No ductal dilatation or inflammation. Spleen: Normal in size without focal abnormality. Adrenals/Urinary Tract: Normal adrenal glands. Homogeneous renal enhancement. No hydronephrosis. No visualized stone or focal lesion. Foley catheter decompresses the urinary bladder. Mild bladder wall thickening may be in part related to nondistention. Stomach/Bowel: Gastrostomy tube appropriately positioned in the stomach. Proximal small bowel loops are decompressed. Occasional fluid-filled loops of distal small bowel. There is diffuse colonic distension with air and fluid. Diffuse colonic redundancy. There is rectal wall thickening, series 3, image 82, but no other colonic wall thickening or inflammation. Previous colonic diverticula are not well seen on the current exam. There is no bowel pneumatosis. Appendix not visualized, no appendicitis. Vascular/Lymphatic: Aortic atherosclerosis. No aortic aneurysm. Patent portal vein and mesenteric vessels. No bulky abdominopelvic adenopathy. Reproductive: Normal sized prostate gland. Other: No free air or ascites. Minimal fat in both inguinal canals. Asymmetric enlargement of the left lateral abdominal wall musculature, series 3,  image 32. There is mild generalized subcutaneous edema. Prior right abdominal wall hernia repair with tacks. Musculoskeletal: Degenerative and postsurgical change throughout the spine. Bones are diffusely under mineralized. There is generalized fatty atrophy of the included musculature. IMPRESSION: 1. Diffuse colonic distension with air and fluid, suggesting colonic ileus. No evidence of obstruction or perforation. There is mild rectal wall thickening which may be related  to proctitis. 2. Asymmetric enlargement of the left lateral abdominal wall musculature. This may represent asymmetric edema, however the possibility of lateral rectus sheath hematoma is raised. 3. Small left pleural effusion is new from prior exam. Trace right pleural effusion. Increase in bibasilar atelectasis/consolidation. 4. Foley catheter decompresses the urinary bladder, wall thickening may be related to nondistention. Aortic Atherosclerosis (ICD10-I70.0). Electronically Signed   By: Narda Rutherford M.D.   On: 04/23/2021 23:24   DG Chest Portable 1 View  Result Date: 04/23/2021 CLINICAL DATA:  PICC line verification. EXAM: PORTABLE CHEST 1 VIEW COMPARISON:  12/24/2020 FINDINGS: Interval placement of RIGHT-sided PICC line, tip overlying the level of the LOWER superior vena cava. Tracheostomy tube appears unchanged. Remote cervical-thoracic fusion. Stable elevation of RIGHT hemidiaphragm and gaseous distension of large bowel loops. There is atelectasis or chronic change at the LEFT lung base, associated with pleural effusion or pleural fluid. There is no pneumothorax. IMPRESSION: 1. Interval placement of PICC line, tip overlying the LOWER superior vena cava. 2. Persistent changes in the LEFT lung base consistent with infiltrate and pleural effusion or pleural thickening. Electronically Signed   By: Norva Pavlov M.D.   On: 04/23/2021 18:29    Procedures Procedures   Medications Ordered in ED Medications  potassium chloride 10 mEq  in 100 mL IVPB (10 mEq Intravenous New Bag/Given 04/24/21 0107)  docusate sodium (COLACE) capsule 100 mg (has no administration in time range)  polyethylene glycol (MIRALAX / GLYCOLAX) packet 17 g (has no administration in time range)  ondansetron (ZOFRAN) injection 4 mg (has no administration in time range)  pantoprazole (PROTONIX) injection 40 mg (has no administration in time range)  iohexol (OMNIPAQUE) 300 MG/ML solution 100 mL (100 mLs Intravenous Contrast Given 04/23/21 2303)    ED Course  I have reviewed the triage vital signs and the nursing notes.  Pertinent labs & imaging results that were available during my care of the patient were reviewed by me and considered in my medical decision making (see chart for details).    MDM Rules/Calculators/A&P                          This is a 70yoM w/ hx and pe as above.  Ddx included: sepsis, appendicitis, pancreatitis, perforation, volvulus, obstruction, ogilvie, constipation, metabolic derangement  All studies independently reviewed by myself, d/w the attending physician, factored into my mdm. -CT AP w/ contrast: diffuse colonic distension w/ air and fluid suggestive of ileus; no evidence of obstruction or perf; possible proctitis; small left pleural effusion -XR chest port: L basilar infiltrate and pleural effusion/thickening -CMP: K+ 2.5 -Unremarkable: lipase, CBCd, Mg++ -Pending: UA  Presentation appears most c/w hypokalemia and colonic ileus or ogilvie. Pt HDS, afebrile, nontoxic. Imaging and labs suggest against appendicitis, pancreatitis, perf, volvulus, obstruction.  K+ repletion promptly initiated. Will admit patient for significant bowel loop dilation and hypokalemia. ICU agreed to admit. Pt understands and agrees w/ plan.  Pt HDS on reevaluation and care transferred to ICU team.  Final Clinical Impression(s) / ED Diagnoses Final diagnoses:  Constipation, unspecified constipation type  Abdominal distension  Hypokalemia     Rx / DC Orders ED Discharge Orders     None        Colvin Caroli, MD 04/24/21 0108    Charlynne Pander, MD 04/24/21 [phone]

## 2021-04-23 NOTE — Progress Notes (Signed)
Pt transported to ct and back with no complications. 

## 2021-04-24 DIAGNOSIS — G894 Chronic pain syndrome: Secondary | ICD-10-CM | POA: Diagnosis present

## 2021-04-24 DIAGNOSIS — E876 Hypokalemia: Secondary | ICD-10-CM | POA: Diagnosis present

## 2021-04-24 DIAGNOSIS — K5900 Constipation, unspecified: Secondary | ICD-10-CM | POA: Diagnosis not present

## 2021-04-24 DIAGNOSIS — E43 Unspecified severe protein-calorie malnutrition: Secondary | ICD-10-CM | POA: Diagnosis present

## 2021-04-24 DIAGNOSIS — Z930 Tracheostomy status: Secondary | ICD-10-CM | POA: Diagnosis not present

## 2021-04-24 DIAGNOSIS — G839 Paralytic syndrome, unspecified: Secondary | ICD-10-CM | POA: Diagnosis present

## 2021-04-24 DIAGNOSIS — Z20822 Contact with and (suspected) exposure to covid-19: Secondary | ICD-10-CM | POA: Diagnosis present

## 2021-04-24 DIAGNOSIS — G629 Polyneuropathy, unspecified: Secondary | ICD-10-CM | POA: Diagnosis present

## 2021-04-24 DIAGNOSIS — J9601 Acute respiratory failure with hypoxia: Secondary | ICD-10-CM | POA: Diagnosis not present

## 2021-04-24 DIAGNOSIS — J9612 Chronic respiratory failure with hypercapnia: Secondary | ICD-10-CM | POA: Diagnosis present

## 2021-04-24 DIAGNOSIS — K567 Ileus, unspecified: Secondary | ICD-10-CM | POA: Diagnosis present

## 2021-04-24 DIAGNOSIS — G901 Familial dysautonomia [Riley-Day]: Secondary | ICD-10-CM | POA: Diagnosis present

## 2021-04-24 DIAGNOSIS — R140 Abdominal distension (gaseous): Secondary | ICD-10-CM | POA: Diagnosis not present

## 2021-04-24 DIAGNOSIS — G709 Myoneural disorder, unspecified: Secondary | ICD-10-CM | POA: Diagnosis present

## 2021-04-24 DIAGNOSIS — I959 Hypotension, unspecified: Secondary | ICD-10-CM | POA: Diagnosis not present

## 2021-04-24 DIAGNOSIS — G7281 Critical illness myopathy: Secondary | ICD-10-CM | POA: Diagnosis present

## 2021-04-24 DIAGNOSIS — T402X5A Adverse effect of other opioids, initial encounter: Secondary | ICD-10-CM | POA: Diagnosis present

## 2021-04-24 DIAGNOSIS — J986 Disorders of diaphragm: Secondary | ICD-10-CM | POA: Diagnosis not present

## 2021-04-24 DIAGNOSIS — G729 Myopathy, unspecified: Secondary | ICD-10-CM | POA: Diagnosis not present

## 2021-04-24 DIAGNOSIS — R532 Functional quadriplegia: Secondary | ICD-10-CM | POA: Diagnosis present

## 2021-04-24 DIAGNOSIS — K5981 Ogilvie syndrome: Secondary | ICD-10-CM | POA: Diagnosis not present

## 2021-04-24 DIAGNOSIS — R109 Unspecified abdominal pain: Secondary | ICD-10-CM | POA: Diagnosis present

## 2021-04-24 DIAGNOSIS — J9611 Chronic respiratory failure with hypoxia: Secondary | ICD-10-CM | POA: Diagnosis present

## 2021-04-24 DIAGNOSIS — K5903 Drug induced constipation: Secondary | ICD-10-CM | POA: Diagnosis not present

## 2021-04-24 DIAGNOSIS — E8809 Other disorders of plasma-protein metabolism, not elsewhere classified: Secondary | ICD-10-CM | POA: Diagnosis present

## 2021-04-24 DIAGNOSIS — E778 Other disorders of glycoprotein metabolism: Secondary | ICD-10-CM | POA: Diagnosis present

## 2021-04-24 DIAGNOSIS — Z931 Gastrostomy status: Secondary | ICD-10-CM | POA: Diagnosis not present

## 2021-04-24 DIAGNOSIS — Z9911 Dependence on respirator [ventilator] status: Secondary | ICD-10-CM | POA: Diagnosis not present

## 2021-04-24 DIAGNOSIS — M545 Low back pain, unspecified: Secondary | ICD-10-CM | POA: Diagnosis present

## 2021-04-24 DIAGNOSIS — Z6829 Body mass index (BMI) 29.0-29.9, adult: Secondary | ICD-10-CM | POA: Diagnosis not present

## 2021-04-24 DIAGNOSIS — K5901 Slow transit constipation: Secondary | ICD-10-CM | POA: Diagnosis not present

## 2021-04-24 DIAGNOSIS — D638 Anemia in other chronic diseases classified elsewhere: Secondary | ICD-10-CM | POA: Diagnosis present

## 2021-04-24 DIAGNOSIS — F39 Unspecified mood [affective] disorder: Secondary | ICD-10-CM | POA: Diagnosis present

## 2021-04-24 LAB — URINALYSIS, ROUTINE W REFLEX MICROSCOPIC
Bacteria, UA: NONE SEEN
Bilirubin Urine: NEGATIVE
Glucose, UA: 50 mg/dL — AB
Ketones, ur: 20 mg/dL — AB
Leukocytes,Ua: NEGATIVE
Nitrite: NEGATIVE
Protein, ur: 100 mg/dL — AB
RBC / HPF: >50 RBC/hpf — ABNORMAL HIGH (ref 0–5)
Specific Gravity, Urine: >1.046 — ABNORMAL HIGH (ref 1.005–1.030)
pH: 5 (ref 5.0–8.0)

## 2021-04-24 LAB — BASIC METABOLIC PANEL
Anion gap: 4 — ABNORMAL LOW (ref 5–15)
Anion gap: 4 — ABNORMAL LOW (ref 5–15)
BUN: 7 mg/dL — ABNORMAL LOW (ref 8–23)
BUN: 6 mg/dL — ABNORMAL LOW (ref 8–23)
CO2: 24 mmol/L (ref 22–32)
CO2: 23 mmol/L (ref 22–32)
Calcium: 8.2 mg/dL — ABNORMAL LOW (ref 8.9–10.3)
Calcium: 8 mg/dL — ABNORMAL LOW (ref 8.9–10.3)
Chloride: 116 mmol/L — ABNORMAL HIGH (ref 98–111)
Chloride: 117 mmol/L — ABNORMAL HIGH (ref 98–111)
Creatinine, Ser: <0.3 mg/dL — ABNORMAL LOW (ref 0.61–1.24)
Creatinine, Ser: <0.3 mg/dL — ABNORMAL LOW (ref 0.61–1.24)
Glucose, Bld: 86 mg/dL (ref 70–99)
Glucose, Bld: 72 mg/dL (ref 70–99)
Potassium: 2.8 mmol/L — ABNORMAL LOW (ref 3.5–5.1)
Potassium: 3.4 mmol/L — ABNORMAL LOW (ref 3.5–5.1)
Sodium: 144 mmol/L (ref 135–145)
Sodium: 144 mmol/L (ref 135–145)

## 2021-04-24 LAB — GLUCOSE, CAPILLARY
Glucose-Capillary: 69 mg/dL — ABNORMAL LOW (ref 70–99)
Glucose-Capillary: 112 mg/dL — ABNORMAL HIGH (ref 70–99)
Glucose-Capillary: 79 mg/dL (ref 70–99)
Glucose-Capillary: 71 mg/dL (ref 70–99)
Glucose-Capillary: 63 mg/dL — ABNORMAL LOW (ref 70–99)
Glucose-Capillary: 93 mg/dL (ref 70–99)
Glucose-Capillary: 64 mg/dL — ABNORMAL LOW (ref 70–99)
Glucose-Capillary: 69 mg/dL — ABNORMAL LOW (ref 70–99)
Glucose-Capillary: 81 mg/dL (ref 70–99)

## 2021-04-24 LAB — HIV ANTIBODY (ROUTINE TESTING W REFLEX): HIV Screen 4th Generation wRfx: NONREACTIVE

## 2021-04-24 LAB — MRSA NEXT GEN BY PCR, NASAL: MRSA by PCR Next Gen: DETECTED — AB

## 2021-04-24 MED ORDER — DEXTROSE 50 % IV SOLN
12.5000 g | INTRAVENOUS | Status: AC
  Administered 2021-04-24: 12.5 g via INTRAVENOUS

## 2021-04-24 MED ORDER — DEXTROSE 10 % IV SOLN: INTRAVENOUS | Status: DC

## 2021-04-24 MED ORDER — POTASSIUM CHLORIDE IN NACL 20-0.9 MEQ/L-% IV SOLN
INTRAVENOUS | Status: DC
  Filled 2021-04-24 (×6): qty 1000

## 2021-04-24 MED ORDER — DEXTROSE 50 % IV SOLN
INTRAVENOUS | Status: AC
  Administered 2021-04-24: 50 mL
  Filled 2021-04-24: qty 50

## 2021-04-24 MED ORDER — DOCUSATE SODIUM 100 MG PO CAPS: 100.0000 mg | ORAL_CAPSULE | Freq: Two times a day (BID) | ORAL | Status: DC | PRN

## 2021-04-24 MED ORDER — POLYETHYLENE GLYCOL 3350 17 G PO PACK
17.0000 g | PACK | Freq: Two times a day (BID) | ORAL | Status: DC
  Administered 2021-04-24 – 2021-04-25 (×3): 17 g
  Filled 2021-04-24 (×3): qty 1

## 2021-04-24 MED ORDER — ENOXAPARIN SODIUM 40 MG/0.4ML IJ SOSY
40.0000 mg | PREFILLED_SYRINGE | INTRAMUSCULAR | Status: DC
  Administered 2021-04-24 – 2021-04-29 (×6): 40 mg via SUBCUTANEOUS
  Filled 2021-04-24 (×6): qty 0.4

## 2021-04-24 MED ORDER — POTASSIUM CHLORIDE 10 MEQ/100ML IV SOLN
10.0000 meq | INTRAVENOUS | Status: AC
  Administered 2021-04-24 – 2021-04-25 (×4): 10 meq via INTRAVENOUS
  Filled 2021-04-24 (×4): qty 100

## 2021-04-24 MED ORDER — MELATONIN 3 MG PO TABS
3.0000 mg | ORAL_TABLET | Freq: Every day | ORAL | Status: DC
  Administered 2021-04-24: 3 mg via ORAL
  Filled 2021-04-24: qty 1

## 2021-04-24 MED ORDER — HYDROXYZINE HCL 25 MG PO TABS
25.0000 mg | ORAL_TABLET | Freq: Three times a day (TID) | ORAL | Status: DC | PRN
  Administered 2021-04-25 – 2021-06-08 (×36): 25 mg
  Filled 2021-04-24 (×40): qty 1

## 2021-04-24 MED ORDER — DULOXETINE HCL 30 MG PO CPEP
30.0000 mg | ORAL_CAPSULE | Freq: Two times a day (BID) | ORAL | Status: DC
  Administered 2021-04-24 – 2021-05-24 (×61): 30 mg via ORAL
  Filled 2021-04-24 (×64): qty 1

## 2021-04-24 MED ORDER — ORAL CARE MOUTH RINSE
15.0000 mL | OROMUCOSAL | Status: DC
  Administered 2021-04-24 – 2021-04-25 (×14): 15 mL via OROMUCOSAL

## 2021-04-24 MED ORDER — MUPIROCIN 2 % EX OINT
1.0000 "application " | TOPICAL_OINTMENT | Freq: Two times a day (BID) | CUTANEOUS | Status: AC
  Administered 2021-04-24 – 2021-04-28 (×10): 1 via NASAL
  Filled 2021-04-24 (×4): qty 22

## 2021-04-24 MED ORDER — DEXTROSE 50 % IV SOLN
INTRAVENOUS | Status: AC
  Filled 2021-04-24: qty 50

## 2021-04-24 MED ORDER — SODIUM CHLORIDE 0.9% FLUSH
10.0000 mL | INTRAVENOUS | Status: DC | PRN
  Administered 2021-05-15: 10 mL

## 2021-04-24 MED ORDER — IPRATROPIUM-ALBUTEROL 0.5-2.5 (3) MG/3ML IN SOLN: 3.0000 mL | Freq: Four times a day (QID) | RESPIRATORY_TRACT | Status: DC | PRN

## 2021-04-24 MED ORDER — SODIUM CHLORIDE 0.9% FLUSH
10.0000 mL | Freq: Two times a day (BID) | INTRAVENOUS | Status: DC
  Administered 2021-04-24 – 2021-05-23 (×58): 10 mL
  Administered 2021-05-24: 3 mL
  Administered 2021-05-25 – 2021-06-05 (×23): 10 mL
  Administered 2021-06-06: 20 mL
  Administered 2021-06-06 – 2021-06-09 (×6): 10 mL

## 2021-04-24 MED ORDER — PREGABALIN 50 MG PO CAPS
100.0000 mg | ORAL_CAPSULE | Freq: Three times a day (TID) | ORAL | Status: DC
  Administered 2021-04-24 – 2021-06-09 (×138): 100 mg
  Filled 2021-04-24 (×141): qty 2

## 2021-04-24 MED ORDER — PANTOPRAZOLE SODIUM 40 MG IV SOLR
40.0000 mg | Freq: Every day | INTRAVENOUS | Status: DC
  Administered 2021-04-24 – 2021-04-25 (×2): 40 mg via INTRAVENOUS
  Filled 2021-04-24 (×2): qty 40

## 2021-04-24 MED ORDER — CHLORHEXIDINE GLUCONATE CLOTH 2 % EX PADS
6.0000 | MEDICATED_PAD | Freq: Every day | CUTANEOUS | Status: DC
  Administered 2021-04-24 – 2021-04-25 (×2): 6 via TOPICAL

## 2021-04-24 MED ORDER — DEXTROSE 50 % IV SOLN: 12.5000 g | INTRAVENOUS | Status: AC

## 2021-04-24 MED ORDER — FLEET ENEMA 7-19 GM/118ML RE ENEM
1.0000 | ENEMA | Freq: Once | RECTAL | Status: AC
  Administered 2021-04-24: 1 via RECTAL
  Filled 2021-04-24: qty 1

## 2021-04-24 MED ORDER — POTASSIUM CHLORIDE 10 MEQ/100ML IV SOLN: 10.0000 meq | INTRAVENOUS | Status: DC

## 2021-04-24 MED ORDER — POLYETHYLENE GLYCOL 3350 17 G PO PACK
17.0000 g | PACK | Freq: Two times a day (BID) | ORAL | Status: DC
  Administered 2021-04-24: 17 g via ORAL
  Filled 2021-04-24 (×2): qty 1

## 2021-04-24 MED ORDER — MELATONIN 3 MG PO TABS
3.0000 mg | ORAL_TABLET | Freq: Every day | ORAL | Status: DC
  Administered 2021-04-25 – 2021-06-08 (×45): 3 mg
  Filled 2021-04-24 (×48): qty 1

## 2021-04-24 MED ORDER — POTASSIUM CHLORIDE 10 MEQ/100ML IV SOLN
10.0000 meq | INTRAVENOUS | Status: AC
  Administered 2021-04-24 (×6): 10 meq via INTRAVENOUS
  Filled 2021-04-24 (×6): qty 100

## 2021-04-24 MED ORDER — WHITE PETROLATUM EX OINT
TOPICAL_OINTMENT | CUTANEOUS | Status: AC
  Filled 2021-04-24: qty 28.35

## 2021-04-24 MED ORDER — ONDANSETRON HCL 4 MG/2ML IJ SOLN: 4.0000 mg | Freq: Four times a day (QID) | INTRAMUSCULAR | Status: DC | PRN

## 2021-04-24 MED ORDER — CHLORHEXIDINE GLUCONATE 0.12% ORAL RINSE (MEDLINE KIT)
15.0000 mL | Freq: Two times a day (BID) | OROMUCOSAL | Status: DC
  Administered 2021-04-24 (×2): 15 mL via OROMUCOSAL

## 2021-04-24 MED ORDER — DEXTROSE 50 % IV SOLN
INTRAVENOUS | Status: AC
  Administered 2021-04-24: 12.5 g via INTRAVENOUS
  Filled 2021-04-24: qty 50

## 2021-04-24 NOTE — Progress Notes (Signed)
Hypoglycemic Event  CBG: 69  Treatment: D50 25 mL (12.5 gm)  Symptoms: None  Follow-up CBG: Time: 0340 CBG Result: 112  Possible Reasons for Event: Unknown    Ordell Prichett, Pearla Dubonnet

## 2021-04-24 NOTE — Progress Notes (Signed)
Assisted tele visit to patient with wife.  Chibueze Beasley Harold, RN  

## 2021-04-24 NOTE — Progress Notes (Signed)
eLink Physician-Brief Progress Note Patient Name: Andrew Rivera DOB: August 12, 1951 MRN: [id number]   Date of Service  04/24/2021  HPI/Events of Note  Hypoglycemic episodes X 2 - Blood glucose = 63 and 64.  eICU Interventions  Plan: Decrease 0.9 NaCl + KCl to 30 mL/hour. D10W IV infusion to run at 40 mL/hour.     Intervention Category Major Interventions: Other:  Lenell Antu 04/24/2021, 8:40 PM

## 2021-04-24 NOTE — Progress Notes (Signed)
eLink Physician-Brief Progress Note Patient Name: Andrew Rivera DOB: 10-07-1951 MRN: [id number]   Date of Service  04/24/2021  HPI/Events of Note  Hypokalemia - K+ = 3.4 and Creatinine = < 0.30.  eICU Interventions  Will replace K+.     Intervention Category Major Interventions: Electrolyte abnormality - evaluation and management  Ardena Gangl Eugene 04/24/2021, 9:25 PM

## 2021-04-24 NOTE — Progress Notes (Signed)
Pt transported to 2m with no complications. 

## 2021-04-24 NOTE — Progress Notes (Addendum)
NAME:  Andrew Rivera, MRN:  [id number], DOB:  1951-08-28, LOS: 0 ADMISSION DATE:  04/23/2021, CONSULTATION DATE:  04/24/21 REFERRING MD:  ED, CHIEF COMPLAINT:  abdominal pain   History of Present Illness:  70 yo man with hx of chronic trach, chronic vent, hx of constipation issues and colonic dilation, Hx of PNA, critical illness myopathy, here with increasing abdominal distention and abdominal pain.  Pain waxes and wanes.    Constipation, ileus.  Recently in ED 12/2020 for constipation, disimpacted.    Hypokalemia  In ED, received 1L NS, KCL so far.   Pertinent  Medical History  Chronic pain IJ thromboembolism  Hx of septic shock Chronic respiratory failure.  Recently in ED 3/22 for constipation, colonic dilation (fecal impaction vs ileus), BLL pna,  Hx of critical illness myopathy  Hx of spinal surgeries  Admitted in 06/2019 and 08/2019 with MSSA bacteriemia, tracheostomy done then.  Hx of pressure wound  Former smoker  Tylenol, cymbalta, feso4, dilaudid, duonebs, ativan, melatonin, protonix, miralax, lyrica, senna, ambien  Significant Hospital Events: Including procedures, antibiotic start and stop dates in addition to other pertinent events   7/2 Admit  Interim History / Subjective:   No acute events overnight  Objective   Blood pressure (!) 130/46, pulse 67, temperature 98.3 F (36.8 C), temperature source Oral, resp. rate 17, height 5\' 8"  (1.727 m), weight 90.9 kg, SpO2 99 %.    Vent Mode: PRVC FiO2 (%):  [28 %] 28 % Set Rate:  [16 bmp] 16 bmp Vt Set:  [540 mL] 540 mL PEEP:  [5 cmH20] 5 cmH20 Plateau Pressure:  [17 cmH20-19 cmH20] 19 cmH20   Intake/Output Summary (Last 24 hours) at 04/24/2021 06/25/2021 Last data filed at 04/24/2021 0700 Gross per 24 hour  Intake 1681.98 ml  Output 600 ml  Net 1081.98 ml   Filed Weights   04/24/21 0500  Weight: 90.9 kg    Examination: Gen:      No acute distress HEENT:  EOMI, sclera anicteric Neck:     No masses; no  thyromegaly, trach Lungs:    Clear to auscultation bilaterally; normal respiratory effort CV:         Regular rate and rhythm; no murmurs Abd:      Distended with no tenderness, diminished bowel sounds Ext:    No edema; adequate peripheral perfusion Skin:      Warm and dry; no rash Neuro: alert and oriented x 3  Resolved Hospital Problem list     Assessment & Plan:  Colonic ileus: worsening distention for past weeks, increasing waxing and waning pain.   Was seen in ED for impacted stool, severe constipation in 3/22, suggesting this has been going on for months.   CT abdomen reviewed with colonic distention, colonic ileus without obstruction or perforation. Admitted for pain control, NPO, cont miralax, dulcolax We will try an Fleet enema  Chronic resp failure on vent:  Cont vent settings.  Stable on current settings.    Hypokalemia Repleted. Recehck labs  Severe protein calorie malnutrition present on admission Hypoalbuminemia, hypoproteinemia Nutrition consult when tube feeds are resumed  Will transfer to hospitalist service.  PCCM will follow intermittently for trach/vent  Best Practice (right click and "Reselect all SmartList Selections" daily)   Diet/type: NPO DVT prophylaxis: LMWH GI prophylaxis: PPI Lines: N/A Foley:  N/A Code Status:  full code Last date of multidisciplinary goals of care discussion []   Critical care time:    4/22 MD [place] [facility] See Amion for pager  If no response to pager , please call [phone] until 7pm After 7:00 pm call Elink  [phone] 04/24/2021, 8:48 AM

## 2021-04-25 DIAGNOSIS — K5900 Constipation, unspecified: Secondary | ICD-10-CM | POA: Diagnosis not present

## 2021-04-25 DIAGNOSIS — R140 Abdominal distension (gaseous): Secondary | ICD-10-CM | POA: Diagnosis not present

## 2021-04-25 DIAGNOSIS — K567 Ileus, unspecified: Secondary | ICD-10-CM | POA: Diagnosis not present

## 2021-04-25 DIAGNOSIS — E876 Hypokalemia: Secondary | ICD-10-CM | POA: Diagnosis not present

## 2021-04-25 DIAGNOSIS — K5981 Ogilvie syndrome: Secondary | ICD-10-CM | POA: Diagnosis not present

## 2021-04-25 LAB — BASIC METABOLIC PANEL
Anion gap: 4 — ABNORMAL LOW (ref 5–15)
BUN: <5 mg/dL — ABNORMAL LOW (ref 8–23)
CO2: 24 mmol/L (ref 22–32)
Calcium: 8 mg/dL — ABNORMAL LOW (ref 8.9–10.3)
Chloride: 114 mmol/L — ABNORMAL HIGH (ref 98–111)
Creatinine, Ser: <0.3 mg/dL — ABNORMAL LOW (ref 0.61–1.24)
Glucose, Bld: 98 mg/dL (ref 70–99)
Potassium: 3.3 mmol/L — ABNORMAL LOW (ref 3.5–5.1)
Sodium: 142 mmol/L (ref 135–145)

## 2021-04-25 LAB — PHOSPHORUS
Phosphorus: 3 mg/dL (ref 2.5–4.6)
Phosphorus: 2.8 mg/dL (ref 2.5–4.6)

## 2021-04-25 LAB — GLUCOSE, CAPILLARY
Glucose-Capillary: 91 mg/dL (ref 70–99)
Glucose-Capillary: 82 mg/dL (ref 70–99)
Glucose-Capillary: 81 mg/dL (ref 70–99)
Glucose-Capillary: 88 mg/dL (ref 70–99)
Glucose-Capillary: 93 mg/dL (ref 70–99)
Glucose-Capillary: 130 mg/dL — ABNORMAL HIGH (ref 70–99)

## 2021-04-25 LAB — MAGNESIUM
Magnesium: 1.7 mg/dL (ref 1.7–2.4)
Magnesium: 1.9 mg/dL (ref 1.7–2.4)

## 2021-04-25 LAB — POTASSIUM: Potassium: 3.4 mmol/L — ABNORMAL LOW (ref 3.5–5.1)

## 2021-04-25 MED ORDER — DOCUSATE SODIUM 50 MG/5ML PO LIQD
100.0000 mg | Freq: Two times a day (BID) | ORAL | Status: DC | PRN
  Administered 2021-04-25 – 2021-04-28 (×3): 100 mg
  Filled 2021-04-25 (×3): qty 10

## 2021-04-25 MED ORDER — ORAL CARE MOUTH RINSE
15.0000 mL | Freq: Four times a day (QID) | OROMUCOSAL | Status: DC
  Administered 2021-04-26 – 2021-05-02 (×25): 15 mL via OROMUCOSAL

## 2021-04-25 MED ORDER — MAGNESIUM SULFATE 2 GM/50ML IV SOLN
2.0000 g | Freq: Once | INTRAVENOUS | Status: AC
  Administered 2021-04-25: 2 g via INTRAVENOUS
  Filled 2021-04-25: qty 50

## 2021-04-25 MED ORDER — POTASSIUM CHLORIDE 10 MEQ/100ML IV SOLN
10.0000 meq | INTRAVENOUS | Status: AC
  Administered 2021-04-25 (×4): 10 meq via INTRAVENOUS
  Filled 2021-04-25 (×4): qty 100

## 2021-04-25 MED ORDER — VITAL 1.5 CAL PO LIQD
1000.0000 mL | ORAL | Status: DC
  Administered 2021-04-25: 1000 mL

## 2021-04-25 NOTE — Progress Notes (Signed)
Initial Nutrition Assessment  DOCUMENTATION CODES:   Not applicable  INTERVENTION:   Initiate trickle tube feeding via PEG: Vital 1.5 @ 20 ml/hr   As able recommend advancing TF to goal:  Vital 1.5 at 55 ml/h (1320 ml per day) Prosource TF 45 ml TID  Provides 2100 kcal, 122 gm protein, 1003 ml free water daily   NUTRITION DIAGNOSIS:   Inadequate oral intake related to inability to eat as evidenced by NPO status.  GOAL:   Patient will meet greater than or equal to 90% of their needs  MONITOR:   I & O's, TF tolerance  REASON FOR ASSESSMENT:   Ventilator    ASSESSMENT:   Pt admitted from [facility] with increasing abdominal distention and abdominal pain due to constipation and ileus. Pt with PMH of chronic pain, septic shock, chronic respiratory failure, recent ED visit for constipation, hx spinal surgeries, MSSA bacteriemia and pressure injury.   Pt on vent support via trach. CCM signed off, hospitalist service following for ileus management.    Pt on video call with family at time of visit. Spoke with RN who reports pt with BM 7/3 and high-pitched bowel sounds this am. Spoke with MD, ok to start trickle TF today. Hospitalist coordinating with GI today.   7/2 s/p CT - notes colonic ileus and PEG  Medications reviewed and include: miralax NS with 20 mEq KCl/L @ 30 ml/hr D10 @ 40 ml/hr Labs reviewed: K+: 3.3 CBG's: 69-91    Diet Order:   Diet Order             Diet NPO time specified  Diet effective now                   EDUCATION NEEDS:   Not appropriate for education at this time  Skin:  Skin Assessment: Reviewed RN Assessment  Last BM:  7/3 medium; type 7  Height:   Ht Readings from Last 1 Encounters:  04/23/21 5\' 8"  (1.727 m)    Weight:   Wt Readings from Last 1 Encounters:  04/24/21 90.9 kg    Ideal Body Weight:     BMI:  Body mass index is 30.47 kg/m.  Estimated Nutritional Needs:   Kcal:  1900-2100  Protein:  100-125  grams  Fluid:  >1.9 L/day  06/25/21., RD, LDN, CNSC See AMiON for contact information

## 2021-04-25 NOTE — Consult Note (Addendum)
Consultation  Referring Provider:   Dr. Karleen Hampshire Primary Care Physician:  Deanne Coffer, MD Primary Gastroenterologist:   Althia Forts (saw [facility] 08/29/2018)      Reason for Consultation:     Ileus and severe colonic distention         HPI:   DEMONTA WOMBLES is a 70 y.o. male with a past medical history of chronic trach, chronic vent, history of constipation issues and colonic dilation who presented to the hospital on 04/24/2021 with increasing abdominal distention and abdominal pain.    Per critical care physicians notes patient has constipation and ileus.  Recently saw in the ED 3/22 for constipation with disimpaction.  Also hypokalemia.    Patient has chronic trach and tries to speak but it is hard to understand what he is saying.  Denies abdominal pain.  GI history: 08/29/2018 seen at [facility] for some right upper quadrant pain  Past Medical History:  Diagnosis Date   Acute on chronic respiratory failure with hypoxia (HCC)    Altered mental status, unspecified    Chronic pain syndrome    Critical illness myopathy    Internal jugular (IJ) vein thromboembolism, acute, unspecified laterality ([place])    Septic shock ([place])     History reviewed. No pertinent surgical history.  No family history of GI cancer  Social History   Tobacco Use   Smoking status: Former    Packs/day: 1.00    Pack years: 0.00    Types: Cigarettes    Quit date: 01/25/1990    Years since quitting: 31.2   Smokeless tobacco: Never  Substance Use Topics   Alcohol use: Not Currently   Drug use: Never    Prior to Admission medications   Medication Sig Start Date End Date Taking? Authorizing Provider  acetaminophen (TYLENOL) 325 MG tablet Place 650 mg into feeding tube every 6 (six) hours as needed for moderate pain or mild pain.   Yes [provider]  Amino Acids-Protein Hydrolys (FEEDING SUPPLEMENT, PRO-STAT SUGAR FREE 64,) LIQD Place 30 mLs into feeding tube 2 (two) times daily.   Yes [provider]  Carboxymethylcellulose Sodium 1 % GEL Place 1 drop into both eyes in the morning, at noon, and at bedtime.   Yes [provider]  chlorhexidine (PERIDEX) 0.12 % solution Use as directed 5 mLs in the mouth or throat 2 (two) times daily.   Yes [provider]  Digestive Enzymes TABS Take 1 tablet by mouth as needed (clogged tube).   Yes [provider]  doxazosin (CARDURA) 2 MG tablet Place 2 mg into feeding tube daily.   Yes [provider]  DULoxetine (CYMBALTA) 30 MG capsule Take 30 mg by mouth 2 (two) times daily.   Yes [provider]  ERYTHROMYCIN LACTOBIONATE IV Inject 250 mg into the vein in the morning, at noon, and at bedtime. 04/19/21  Yes [provider]  fentaNYL (DURAGESIC) 50 MCG/HR Place 1 patch onto the skin every 3 (three) days.   Yes [provider]  ferrous sulfate 325 (65 FE) MG tablet Take 325 mg by mouth See admin instructions. Every 2 days per g-tube   Yes [provider]  gabapentin (NEURONTIN) 300 MG capsule Place 300 mg into feeding tube 3 (three) times daily.   Yes [provider]  glycopyrrolate (ROBINUL) 1 MG tablet Place 1 mg into feeding tube 2 (two) times daily.   Yes [provider]  Guar Gum (NUTRISOURCE FIBER)  PACK Place 1 packet into feeding tube every 12 (twelve) hours.   Yes [provider]  HYDROmorphone (DILAUDID) 2 MG tablet Place 2 mg into feeding tube every 8 (eight) hours as needed for severe pain.   Yes [provider]  hydrOXYzine (ATARAX/VISTARIL) 25 MG tablet Place 25 mg into feeding tube every 6 (six) hours as needed for anxiety.   Yes [provider]  ipratropium-albuterol (DUONEB) 0.5-2.5 (3) MG/3ML SOLN Take 3 mLs by nebulization every 4 (four) hours as needed (respiratory failure).   Yes [provider]  lactulose, encephalopathy, (CHRONULAC) 10 GM/15ML SOLN Place 20 g into feeding tube 2 (two) times daily as  needed (constipation).   Yes [provider]  lansoprazole (PREVACID) 30 MG capsule Place 30 mg into feeding tube daily at 12 noon.   Yes [provider]  levocetirizine (XYZAL) 5 MG tablet Place 5 mg into feeding tube at bedtime as needed for allergies.   Yes [provider]  lisinopril (ZESTRIL) 5 MG tablet Place 5 mg into feeding tube daily.   Yes [provider]  LORazepam (ATIVAN) 0.5 MG tablet Place 0.5 mg into feeding tube every 8 (eight) hours as needed for anxiety.   Yes [provider]  Melatonin 10 MG TABS Give 10 mg by tube at bedtime.   Yes [provider]  Nutritional Supplements (FEEDING SUPPLEMENT, OSMOLITE 1.5 CAL,) LIQD Place 30 mLs into feeding tube continuous. Goal rate is 20m/hour   Yes [provider]  polyethylene glycol (MIRALAX / GLYCOLAX) 17 g packet Place 17 g into feeding tube daily.   Yes [provider]  Potassium Chloride 40 MEQ/15ML (20%) SOLN Give 40 mEq by tube 5 (five) times daily.   Yes [provider]  senna (SENOKOT) 8.6 MG TABS tablet Place 8.6 mg into feeding tube every 12 (twelve) hours.   Yes [provider]  sertraline (ZOLOFT) 50 MG tablet Place 50 mg into feeding tube at bedtime.   Yes [provider]  Sodium Chloride Flush (SALINE FLUSH) 0.9 % SOLN Inject 10 mLs into the vein in the morning and at bedtime. Flush all unused lumens   Yes [provider]    Current Facility-Administered Medications  Medication Dose Route Frequency Provider Last Rate Last Admin   0.9 % NaCl with KCl 20 mEq/ L  infusion   Intravenous Continuous SAnders Simmonds MD 30 mL/hr at 04/25/21 1000 Infusion Verify at 04/25/21 1000   chlorhexidine gluconate (MEDLINE KIT) (PERIDEX) 0.12 % solution 15 mL  15 mL Mouth Rinse BID GCollier Bullock MD   15 mL at 04/24/21 2006   Chlorhexidine Gluconate Cloth 2 % PADS 6 each  6 each Topical Daily GCollier Bullock MD   6 each at  04/25/21 0541   dextrose 10 % infusion   Intravenous Continuous SAnders Simmonds MD 40 mL/hr at 04/25/21 1000 Infusion Verify at 04/25/21 1000   docusate (COLACE) 50 MG/5ML liquid 100 mg  100 mg Per Tube BID PRN AHosie Poisson MD       DULoxetine (CYMBALTA) DR capsule 30 mg  30 mg Oral BID GCollier Bullock MD   30 mg at 04/25/21 0920   enoxaparin (LOVENOX) injection 40 mg  40 mg Subcutaneous Q24H Mannam, Praveen, MD   40 mg at 04/24/21 1500   feeding supplement (VITAL 1.5 CAL) liquid 1,000 mL  1,000 mL Per Tube Q24H AHosie Poisson MD       hydrOXYzine (ATARAX/VISTARIL) tablet 25 mg  25  mg Per Tube TID PRN Collier Bullock, MD   25 mg at 04/25/21 0920   ipratropium-albuterol (DUONEB) 0.5-2.5 (3) MG/3ML nebulizer solution 3 mL  3 mL Nebulization Q6H PRN Collier Bullock, MD       magnesium sulfate IVPB 2 g 50 mL  2 g Intravenous Once Hosie Poisson, MD       MEDLINE mouth rinse  15 mL Mouth Rinse 10 times per day Collier Bullock, MD   15 mL at 04/25/21 1610   melatonin tablet 3 mg  3 mg Per Tube QHS Mannam, Praveen, MD       mupirocin ointment (BACTROBAN) 2 % 1 application  1 application Nasal BID Collier Bullock, MD   1 application at 96/04/54 0927   ondansetron (ZOFRAN) injection 4 mg  4 mg Intravenous Q6H PRN Collier Bullock, MD       pantoprazole (PROTONIX) injection 40 mg  40 mg Intravenous Daily Collier Bullock, MD   40 mg at 04/25/21 0920   polyethylene glycol (MIRALAX / GLYCOLAX) packet 17 g  17 g Per Tube BID Mannam, Praveen, MD   17 g at 04/25/21 0920   pregabalin (LYRICA) capsule 100 mg  100 mg Per Tube TID Collier Bullock, MD   100 mg at 04/25/21 0920   sodium chloride flush (NS) 0.9 % injection 10-40 mL  10-40 mL Intracatheter Q12H Collier Bullock, MD   10 mL at 04/25/21 0981   sodium chloride flush (NS) 0.9 % injection 10-40 mL  10-40 mL Intracatheter PRN Collier Bullock, MD        Allergies as of 04/23/2021 - Review Complete 04/23/2021  Allergen Reaction Noted   Codeine  Nausea Only 02/11/2013   Oxycodone-acetaminophen Nausea Only 11/21/2016     Review of Systems:    Constitutional: No weight loss Skin: No rash  Cardiovascular: No chest pain Respiratory: No SOB  Gastrointestinal: See HPI and otherwise negative Genitourinary: No dysuria  Neurological: No headache Musculoskeletal: No new muscle pain Hematologic: No bleeding  Psychiatric: No history of depression or anxiety    Physical Exam:  Vital signs in last 24 hours: Temp:  [98.1 F (36.7 C)-98.3 F (36.8 C)] 98.3 F (36.8 C) (07/03 2326) Pulse Rate:  [48-74] 52 (07/04 0900) Resp:  [14-27] 16 (07/04 0900) BP: (125-157)/(33-72) 149/48 (07/04 0900) SpO2:  [96 %-100 %] 100 % (07/04 1141) FiO2 (%):  [28 %] 28 % (07/04 1141) Last BM Date: 04/24/21 General:   Pleasant chronically ill-appearing Caucasian male with tracheostomy appears to be in NAD, Well developed, Well nourished, alert  Head:  Normocephalic and atraumatic. Eyes:   PEERL, EOMI. No icterus. Conjunctiva pink. Ears:  Normal auditory acuity. Neck:  Supple Throat: Oral cavity and pharynx without inflammation, swelling or lesion. + Tracheostomy Lungs: Respirations even and unlabored. Lungs clear to auscultation bilaterally.   No wheezes, crackles, or rhonchi.  Heart: Normal S1, S2. No MRG. Regular rate and rhythm.  Abdomen:  Soft, mild distension, nontender. No rebound or guarding.  Decreased bowel sounds all 4 quadrants, no appreciable masses or hepatomegaly. Rectal:  Not performed.  Msk:  Symmetrical without gross deformities. Peripheral pulses intact.  Extremities:  Without edema, no deformity or joint abnormality.  Neurologic:  Alert and  oriented  Skin:   Dry and intact without significant lesions or rashes. Psychiatric: Demonstrates good judgement and reason without abnormal affect or behaviors.   LAB RESULTS: Recent Labs    04/23/21 2020  WBC 7.1  HGB 10.7*  HCT 33.4*  PLT 224  BMET Recent Labs    04/24/21 0953  04/24/21 2018 04/25/21 0611  NA 144 144 142  K 2.8* 3.4* 3.3*  CL 116* 117* 114*  CO2 _0 GLUCOSE 86 72 98  BUN 7* 6* <5*  CREATININE <0.30* <0.30* <0.30*  CALCIUM 8.2* 8.0* 8.0*   LFT Recent Labs    04/23/21 2020  PROT 5.6*  ALBUMIN 2.2*  AST 11*  ALT 9  ALKPHOS 75  BILITOT 0.7    STUDIES: CT ABDOMEN PELVIS W CONTRAST  Result Date: 04/23/2021 CLINICAL DATA:  Abdominal distension. EXAM: CT ABDOMEN AND PELVIS WITH CONTRAST TECHNIQUE: Multidetector CT imaging of the abdomen and pelvis was performed using the standard protocol following bolus administration of intravenous contrast. CONTRAST:  172m OMNIPAQUE IOHEXOL 300 MG/ML  SOLN COMPARISON:  Abdominal CT 12/24/2020 FINDINGS: Lower chest: Majority of the chest is included in the field of view. Tracheostomy tube above the carina. Right upper extremity PICC in the distal SVC. Small left pleural effusion is new from prior exam. Trace right pleural effusion. Increase in bibasilar atelectasis. Bilateral lower lobe air bronchograms. Hepatobiliary: Scattered small low-density lesions in the liver are similar to prior exam, majority too small to characterize but likely cysts. No calcified gallstone or pericholecystic inflammation. No biliary dilatation. Pancreas: Parenchymal atrophy. No ductal dilatation or inflammation. Spleen: Normal in size without focal abnormality. Adrenals/Urinary Tract: Normal adrenal glands. Homogeneous renal enhancement. No hydronephrosis. No visualized stone or focal lesion. Foley catheter decompresses the urinary bladder. Mild bladder wall thickening may be in part related to nondistention. Stomach/Bowel: Gastrostomy tube appropriately positioned in the stomach. Proximal small bowel loops are decompressed. Occasional fluid-filled loops of distal small bowel. There is diffuse colonic distension with air and fluid. Diffuse colonic redundancy. There is rectal wall thickening, series 3, image 82, but no other colonic  wall thickening or inflammation. Previous colonic diverticula are not well seen on the current exam. There is no bowel pneumatosis. Appendix not visualized, no appendicitis. Vascular/Lymphatic: Aortic atherosclerosis. No aortic aneurysm. Patent portal vein and mesenteric vessels. No bulky abdominopelvic adenopathy. Reproductive: Normal sized prostate gland. Other: No free air or ascites. Minimal fat in both inguinal canals. Asymmetric enlargement of the left lateral abdominal wall musculature, series 3, image 32. There is mild generalized subcutaneous edema. Prior right abdominal wall hernia repair with tacks. Musculoskeletal: Degenerative and postsurgical change throughout the spine. Bones are diffusely under mineralized. There is generalized fatty atrophy of the included musculature. IMPRESSION: 1. Diffuse colonic distension with air and fluid, suggesting colonic ileus. No evidence of obstruction or perforation. There is mild rectal wall thickening which may be related to proctitis. 2. Asymmetric enlargement of the left lateral abdominal wall musculature. This may represent asymmetric edema, however the possibility of lateral rectus sheath hematoma is raised. 3. Small left pleural effusion is new from prior exam. Trace right pleural effusion. Increase in bibasilar atelectasis/consolidation. 4. Foley catheter decompresses the urinary bladder, wall thickening may be related to nondistention. Aortic Atherosclerosis (ICD10-I70.0). Electronically Signed   By: MKeith RakeM.D.   On: 04/23/2021 23:24   DG Chest Portable 1 View  Result Date: 04/23/2021 CLINICAL DATA:  PICC line verification. EXAM: PORTABLE CHEST 1 VIEW COMPARISON:  12/24/2020 FINDINGS: Interval placement of RIGHT-sided PICC line, tip overlying the level of the LOWER superior vena cava. Tracheostomy tube appears unchanged. Remote cervical-thoracic fusion. Stable elevation of RIGHT hemidiaphragm and gaseous distension of large bowel loops. There is  atelectasis or chronic change at the LEFT lung base, associated  with pleural effusion or pleural fluid. There is no pneumothorax. IMPRESSION: 1. Interval placement of PICC line, tip overlying the LOWER superior vena cava. 2. Persistent changes in the LEFT lung base consistent with infiltrate and pleural effusion or pleural thickening. Electronically Signed   By: Nolon Nations M.D.   On: 04/23/2021 18:29      Impression / Plan:   Impression: 1.  Colonic ileus: Worsening distention for the past few weeks, increasing waxing/waning pain, denies at time of exam today, recently seen in the ED 3/22 use for severe constipation and disimpaction, see CT above 2.  Chronic respiratory failure on vent 3.  Hypokalemia: Improving 4.  Hypoalbuminemia/hypoproteinemia  Plan: 1.  Correct hypokalemia. 2.  Agree with MiraLAX. 3.  Could try an enema 4.  Please await any further recommendations from Dr. Silverio Decamp later today, if decompression is pursued this would be by Dr. Henrene Pastor who takes over the service tomorrow.  Thank you for your kind consultation, we will continue to follow.  Lavone Nian [facility]  04/25/2021, 12:36 PM   Attending physician's note   I have taken a history, examined the patient and reviewed the chart. I agree with the Advanced Practitioner's note, impression and recommendations.  70 year old male chronic vent dependent secondary to respiratory failure, chronic trach with history of chronic constipation and colonic ileus, Ogilvie syndrome  No evidence of volvulus based on recent CT abdomen and pelvis  Exam abdomen is distended, soft with no rebound tenderness  Correct electrolyte abnormality, potassium~4 and magnesium~2 Turn in bed and reposition every 3-4 hours Dulcolax suppository daily and MiraLAX bowel purge  No plan for colonoscopy at this point   The patient was provided an opportunity to ask questions and all were answered. The patient agreed with the plan and demonstrated  an understanding of the instructions.  Damaris Hippo , MD [phone]

## 2021-04-25 NOTE — Progress Notes (Signed)
PROGRESS NOTE    Andrew Rivera  JWJ:[id number] DOB: 12/16/1950 DOA: 04/23/2021 PCP: Deanne Coffer, MD    Chief Complaint  Patient presents with   Constipation    Brief Narrative:  70 yo man with hx of chronic trach, chronic vent, hx of constipation issues and colonic dilation, Hx of PNA, critical illness myopathy, here with increasing abdominal distention and abdominal pain. CT of the abdomen shows colonic distention, colonic ileus without obstruction or perforation.. he was initially admitted to [facility] service as he vent dependent.  He was transferred to [facility] on 04/25/21.   Assessment & Plan:   Active Problems:   Ileus ([place])   Colonic distention, ileus Patient reports abdominal distention for more than 2 months. Patient had a small bowel movement earlier today GI will be consulted to see if he is a candidate for decompression Continue with MiraLAX and Dulcolax.     Chronic respiratory failure, vent dependent Patient currently denies any chest pain or shortness of breath and stable on current settings    Hypokalemia and hypomagnesemia Replaced    Severe protein calorie malnutrition Dietary will be consulted   Nutrition via tube feeds    Anemia of chronic disease Hemoglobin around 10.        DVT prophylaxis: LMWH Code Status: (Full CODE) Family Communication: none at bedside.  Disposition:   Status is: Inpatient  Remains inpatient appropriate because:Ongoing diagnostic testing needed not appropriate for outpatient work up  Dispo: The patient is from: Home              Anticipated d/c is to:  pending              Patient currently is not medically stable to d/c.   Difficult to place patient No       Consultants:  Gi  pccm  Procedures: CT ABD  Antimicrobials: none.    Subjective: Reports feeling better than yesterday.   Objective: Vitals:   04/25/21 0800 04/25/21 0826 04/25/21 0900 04/25/21 1141  BP: (!) 157/50  (!) 149/48   Pulse:  (!) 54  (!) 52   Resp: 16 17 16    Temp:      TempSrc:      SpO2: 100% 100% 99% 100%  Weight:      Height:        Intake/Output Summary (Last 24 hours) at 04/25/2021 1233 Last data filed at 04/25/2021 1000 Gross per 24 hour  Intake 3014.9 ml  Output 1010 ml  Net 2004.9 ml   Filed Weights   04/24/21 0500  Weight: 90.9 kg    Examination:  General exam: Appears calm and comfortable  Respiratory system: Clear to auscultation. s/p trach vent dependent. Cardiovascular system: S1 & S2 heard, RRR. No JVD, \ No pedal edema. Gastrointestinal system: Abdomen is soft, non tender, distended, minimal bowel sounds heard.  Central nervous system: Alert and oriented. No focal neurological deficits. Extremities: Symmetric 5 x 5 power. Skin: No rashes, lesions or ulcers Psychiatry:  Mood & affect appropriate.     Data Reviewed: I have personally reviewed following labs and imaging studies  CBC: Recent Labs  Lab 04/23/21 2020  WBC 7.1  NEUTROABS 4.6  HGB 10.7*  HCT 33.4*  MCV 92.5  PLT 621    Basic Metabolic Panel: Recent Labs  Lab 04/23/21 2020 04/24/21 0953 04/24/21 2018 04/25/21 0611 04/25/21 1055  NA 145 144 144 142  --   K 2.5* 2.8* 3.4* 3.3*  --   CL 116*  116* 117* 114*  --   CO2 24 24 23 24   --   GLUCOSE 85 86 72 98  --   BUN 8 7* 6* <5*  --   CREATININE <0.30* <0.30* <0.30* <0.30*  --   CALCIUM 8.3* 8.2* 8.0* 8.0*  --   MG 1.8  --   --   --  1.7  PHOS  --   --   --   --  3.0    GFR: CrCl cannot be calculated (This lab value cannot be used to calculate CrCl because it is not a number: <0.30).  Liver Function Tests: Recent Labs  Lab 04/23/21 2020  AST 11*  ALT 9  ALKPHOS 75  BILITOT 0.7  PROT 5.6*  ALBUMIN 2.2*    CBG: Recent Labs  Lab 04/24/21 2101 04/24/21 2114 04/25/21 0340 04/25/21 0748 04/25/21 1133  GLUCAP 69* 81 91 82 81     Recent Results (from the past 240 hour(s))  MRSA Next Gen by PCR, Nasal     Status: Abnormal   Collection  Time: 04/24/21  2:03 AM   Specimen: Nasal Mucosa; Nasal Swab  Result Value Ref Range Status   MRSA by PCR Next Gen DETECTED (A) NOT DETECTED Final    Comment: RESULT CALLED TO, READ BACK BY AND VERIFIED WITH: RN M TOLER AT 0408 04/24/2021 BY L BENFIELD (NOTE) The GeneXpert MRSA Assay (FDA approved for NASAL specimens only), is one component of a comprehensive MRSA colonization surveillance program. It is not intended to diagnose MRSA infection nor to guide or monitor treatment for MRSA infections. Test performance is not FDA approved in patients less than 15 years old. Performed at [facility], [place] [address], [place], [place] [address]          Radiology Studies: CT ABDOMEN PELVIS W CONTRAST  Result Date: 04/23/2021 CLINICAL DATA:  Abdominal distension. EXAM: CT ABDOMEN AND PELVIS WITH CONTRAST TECHNIQUE: Multidetector CT imaging of the abdomen and pelvis was performed using the standard protocol following bolus administration of intravenous contrast. CONTRAST:  133m OMNIPAQUE IOHEXOL 300 MG/ML  SOLN COMPARISON:  Abdominal CT 12/24/2020 FINDINGS: Lower chest: Majority of the chest is included in the field of view. Tracheostomy tube above the carina. Right upper extremity PICC in the distal SVC. Small left pleural effusion is new from prior exam. Trace right pleural effusion. Increase in bibasilar atelectasis. Bilateral lower lobe air bronchograms. Hepatobiliary: Scattered small low-density lesions in the liver are similar to prior exam, majority too small to characterize but likely cysts. No calcified gallstone or pericholecystic inflammation. No biliary dilatation. Pancreas: Parenchymal atrophy. No ductal dilatation or inflammation. Spleen: Normal in size without focal abnormality. Adrenals/Urinary Tract: Normal adrenal glands. Homogeneous renal enhancement. No hydronephrosis. No visualized stone or focal lesion. Foley catheter decompresses the urinary bladder. Mild bladder wall  thickening may be in part related to nondistention. Stomach/Bowel: Gastrostomy tube appropriately positioned in the stomach. Proximal small bowel loops are decompressed. Occasional fluid-filled loops of distal small bowel. There is diffuse colonic distension with air and fluid. Diffuse colonic redundancy. There is rectal wall thickening, series 3, image 82, but no other colonic wall thickening or inflammation. Previous colonic diverticula are not well seen on the current exam. There is no bowel pneumatosis. Appendix not visualized, no appendicitis. Vascular/Lymphatic: Aortic atherosclerosis. No aortic aneurysm. Patent portal vein and mesenteric vessels. No bulky abdominopelvic adenopathy. Reproductive: Normal sized prostate gland. Other: No free air or ascites. Minimal fat in both inguinal canals. Asymmetric  enlargement of the left lateral abdominal wall musculature, series 3, image 32. There is mild generalized subcutaneous edema. Prior right abdominal wall hernia repair with tacks. Musculoskeletal: Degenerative and postsurgical change throughout the spine. Bones are diffusely under mineralized. There is generalized fatty atrophy of the included musculature. IMPRESSION: 1. Diffuse colonic distension with air and fluid, suggesting colonic ileus. No evidence of obstruction or perforation. There is mild rectal wall thickening which may be related to proctitis. 2. Asymmetric enlargement of the left lateral abdominal wall musculature. This may represent asymmetric edema, however the possibility of lateral rectus sheath hematoma is raised. 3. Small left pleural effusion is new from prior exam. Trace right pleural effusion. Increase in bibasilar atelectasis/consolidation. 4. Foley catheter decompresses the urinary bladder, wall thickening may be related to nondistention. Aortic Atherosclerosis (ICD10-I70.0). Electronically Signed   By: Keith Rake M.D.   On: 04/23/2021 23:24   DG Chest Portable 1 View  Result  Date: 04/23/2021 CLINICAL DATA:  PICC line verification. EXAM: PORTABLE CHEST 1 VIEW COMPARISON:  12/24/2020 FINDINGS: Interval placement of RIGHT-sided PICC line, tip overlying the level of the LOWER superior vena cava. Tracheostomy tube appears unchanged. Remote cervical-thoracic fusion. Stable elevation of RIGHT hemidiaphragm and gaseous distension of large bowel loops. There is atelectasis or chronic change at the LEFT lung base, associated with pleural effusion or pleural fluid. There is no pneumothorax. IMPRESSION: 1. Interval placement of PICC line, tip overlying the LOWER superior vena cava. 2. Persistent changes in the LEFT lung base consistent with infiltrate and pleural effusion or pleural thickening. Electronically Signed   By: Nolon Nations M.D.   On: 04/23/2021 18:29        Scheduled Meds:  chlorhexidine gluconate (MEDLINE KIT)  15 mL Mouth Rinse BID   Chlorhexidine Gluconate Cloth  6 each Topical Daily   DULoxetine  30 mg Oral BID   enoxaparin (LOVENOX) injection  40 mg Subcutaneous Q24H   feeding supplement (VITAL 1.5 CAL)  1,000 mL Per Tube Q24H   mouth rinse  15 mL Mouth Rinse 10 times per day   melatonin  3 mg Per Tube QHS   mupirocin ointment  1 application Nasal BID   pantoprazole (PROTONIX) IV  40 mg Intravenous Daily   polyethylene glycol  17 g Per Tube BID   pregabalin  100 mg Per Tube TID   sodium chloride flush  10-40 mL Intracatheter Q12H   Continuous Infusions:  0.9 % NaCl with KCl 20 mEq / L 30 mL/hr at 04/25/21 1000   dextrose 40 mL/hr at 04/25/21 1000     LOS: 1 day        Hosie Poisson, MD [facility]   To contact the attending provider between 7A-7P or the covering provider during after hours 7P-7A, please log into the web site [email or web address] and access using universal [place] password for that web site. If you do not have the password, please call the hospital operator.  04/25/2021, 12:33 PM

## 2021-04-25 NOTE — Progress Notes (Signed)
Assisted tele visit to patient with wife [name].  Vena Austria, RN

## 2021-04-26 DIAGNOSIS — R140 Abdominal distension (gaseous): Secondary | ICD-10-CM

## 2021-04-26 DIAGNOSIS — K5900 Constipation, unspecified: Secondary | ICD-10-CM | POA: Diagnosis not present

## 2021-04-26 DIAGNOSIS — K567 Ileus, unspecified: Secondary | ICD-10-CM | POA: Diagnosis not present

## 2021-04-26 DIAGNOSIS — E876 Hypokalemia: Secondary | ICD-10-CM

## 2021-04-26 LAB — BASIC METABOLIC PANEL
Anion gap: 5 (ref 5–15)
BUN: <5 mg/dL — ABNORMAL LOW (ref 8–23)
CO2: 24 mmol/L (ref 22–32)
Calcium: 7.7 mg/dL — ABNORMAL LOW (ref 8.9–10.3)
Chloride: 109 mmol/L (ref 98–111)
Creatinine, Ser: <0.3 mg/dL — ABNORMAL LOW (ref 0.61–1.24)
Glucose, Bld: 124 mg/dL — ABNORMAL HIGH (ref 70–99)
Potassium: 3.2 mmol/L — ABNORMAL LOW (ref 3.5–5.1)
Sodium: 138 mmol/L (ref 135–145)

## 2021-04-26 LAB — GLUCOSE, CAPILLARY
Glucose-Capillary: 95 mg/dL (ref 70–99)
Glucose-Capillary: 117 mg/dL — ABNORMAL HIGH (ref 70–99)
Glucose-Capillary: 114 mg/dL — ABNORMAL HIGH (ref 70–99)
Glucose-Capillary: 102 mg/dL — ABNORMAL HIGH (ref 70–99)
Glucose-Capillary: 85 mg/dL (ref 70–99)
Glucose-Capillary: 87 mg/dL (ref 70–99)

## 2021-04-26 LAB — CBC WITH DIFFERENTIAL/PLATELET
Abs Immature Granulocytes: 0.03 10*3/uL (ref 0.00–0.07)
Basophils Absolute: 0 10*3/uL (ref 0.0–0.1)
Basophils Relative: 0 %
Eosinophils Absolute: 0.3 10*3/uL (ref 0.0–0.5)
Eosinophils Relative: 5 %
HCT: 27.4 % — ABNORMAL LOW (ref 39.0–52.0)
Hemoglobin: 9.1 g/dL — ABNORMAL LOW (ref 13.0–17.0)
Immature Granulocytes: 1 %
Lymphocytes Relative: 20 %
Lymphs Abs: 1.3 10*3/uL (ref 0.7–4.0)
MCH: 30.3 pg (ref 26.0–34.0)
MCHC: 33.2 g/dL (ref 30.0–36.0)
MCV: 91.3 fL (ref 80.0–100.0)
Monocytes Absolute: 0.7 10*3/uL (ref 0.1–1.0)
Monocytes Relative: 11 %
Neutro Abs: 4.1 10*3/uL (ref 1.7–7.7)
Neutrophils Relative %: 63 %
Platelets: 179 10*3/uL (ref 150–400)
RBC: 3 MIL/uL — ABNORMAL LOW (ref 4.22–5.81)
RDW: 14.4 % (ref 11.5–15.5)
WBC: 6.4 10*3/uL (ref 4.0–10.5)
nRBC: 0 % (ref 0.0–0.2)

## 2021-04-26 LAB — MAGNESIUM
Magnesium: 1.8 mg/dL (ref 1.7–2.4)
Magnesium: 2 mg/dL (ref 1.7–2.4)

## 2021-04-26 LAB — PHOSPHORUS
Phosphorus: 2.8 mg/dL (ref 2.5–4.6)
Phosphorus: 3.1 mg/dL (ref 2.5–4.6)

## 2021-04-26 MED ORDER — PEG-KCL-NACL-NASULF-NA ASC-C 100 G PO SOLR
1.0000 | Freq: Once | ORAL | Status: AC
  Administered 2021-04-26: 200 g via ORAL
  Filled 2021-04-26: qty 1

## 2021-04-26 MED ORDER — POTASSIUM CHLORIDE 10 MEQ/50ML IV SOLN
10.0000 meq | INTRAVENOUS | Status: AC
  Administered 2021-04-26 (×4): 10 meq via INTRAVENOUS
  Filled 2021-04-26 (×4): qty 50

## 2021-04-26 MED ORDER — METOCLOPRAMIDE HCL 5 MG/ML IJ SOLN
10.0000 mg | Freq: Four times a day (QID) | INTRAMUSCULAR | Status: AC
  Administered 2021-04-26 (×3): 10 mg via INTRAVENOUS
  Filled 2021-04-26 (×3): qty 2

## 2021-04-26 MED ORDER — MAGNESIUM SULFATE 2 GM/50ML IV SOLN
2.0000 g | Freq: Once | INTRAVENOUS | Status: AC
  Administered 2021-04-26: 2 g via INTRAVENOUS
  Filled 2021-04-26: qty 50

## 2021-04-26 MED ORDER — METOCLOPRAMIDE HCL 5 MG/ML IJ SOLN: 10.0000 mg | Freq: Four times a day (QID) | INTRAMUSCULAR | Status: DC

## 2021-04-26 MED ORDER — POTASSIUM CHLORIDE 20 MEQ PO PACK
20.0000 meq | PACK | ORAL | Status: AC
  Administered 2021-04-26 (×2): 20 meq
  Filled 2021-04-26 (×2): qty 1

## 2021-04-26 MED ORDER — POLYETHYLENE GLYCOL 3350 17 G PO PACK
17.0000 g | PACK | Freq: Two times a day (BID) | ORAL | Status: DC
  Administered 2021-04-27 – 2021-04-28 (×3): 17 g
  Filled 2021-04-26 (×3): qty 1

## 2021-04-26 MED ORDER — PANTOPRAZOLE SODIUM 40 MG PO PACK
40.0000 mg | PACK | Freq: Every day | ORAL | Status: DC
  Administered 2021-04-26 – 2021-06-09 (×45): 40 mg
  Filled 2021-04-26 (×45): qty 20

## 2021-04-26 NOTE — Progress Notes (Signed)
RT NOTE: patient is chronic vent/trach.  Currently not breathing over set RR.  Hold on SBT.  Tolerating current ventilator settings well at this time.  Will continue to monitor.

## 2021-04-26 NOTE — Progress Notes (Signed)
[facility] ADULT ICU REPLACEMENT PROTOCOL   The patient does apply for the [facility] Adult ICU Electrolyte Replacment Protocol based on the criteria listed below:   1. Is GFR >/= 30 ml/min? Yes.    Patient's GFR today is no calculated 2. Is SCr </= 2? No. Patient's SCr is <0.30 ml/kg/hr 3. Did SCr increase >/= 0.5 in 24 hours? No. 4. Abnormal electrolyte(s): k+ 3.2, mag 1.8 5. Ordered repletion with: standing orders 6. If a panic level lab has been reported, has the CCM MD in charge been notified? No..   Physician:    Melvern Banker 04/26/2021 5:30 AM

## 2021-04-26 NOTE — Progress Notes (Signed)
Assisted tele visit to patient with wife.  Alea Ryer Anderson, RN   

## 2021-04-26 NOTE — Plan of Care (Signed)
  Problem: Education: Goal: Knowledge of General Education information will improve Description Including pain rating scale, medication(s)/side effects and non-pharmacologic comfort measures Outcome: Progressing   Problem: Health Behavior/Discharge Planning: Goal: Ability to manage health-related needs will improve Outcome: Progressing   Problem: Clinical Measurements: Goal: Ability to maintain clinical measurements within normal limits will improve Outcome: Progressing Goal: Will remain free from infection Outcome: Progressing Goal: Diagnostic test results will improve Outcome: Progressing Goal: Respiratory complications will improve Outcome: Progressing Goal: Cardiovascular complication will be avoided Outcome: Progressing   Problem: Activity: Goal: Risk for activity intolerance will decrease Outcome: Progressing   Problem: Nutrition: Goal: Adequate nutrition will be maintained Outcome: Progressing   Problem: Elimination: Goal: Will not experience complications related to bowel motility Outcome: Progressing Goal: Will not experience complications related to urinary retention Outcome: Progressing   Problem: Pain Managment: Goal: General experience of comfort will improve Outcome: Progressing   Problem: Safety: Goal: Ability to remain free from injury will improve Outcome: Progressing   Problem: Skin Integrity: Goal: Risk for impaired skin integrity will decrease Outcome: Progressing   Problem: Activity: Goal: Ability to tolerate increased activity will improve Outcome: Progressing   Problem: Respiratory: Goal: Ability to maintain a clear airway and adequate ventilation will improve Outcome: Progressing   Problem: Role Relationship: Goal: Method of communication will improve Outcome: Progressing   

## 2021-04-26 NOTE — Progress Notes (Addendum)
Daily Rounding Note  04/26/2021, 8:30 AM  LOS: 2 days   SUBJECTIVE:   Chief complaint:  colonic ileus  Received multiple runs of IV Potassium on 7/3, 7/4.  Received 20 meq Potassium this AM, set to receive additional 20 meq 1015 this AM.   IV mag 7/4 and this AM.   1 stool documented on 7/2, 1 on 7/3.   Denies abd pain now or recently.  Not passing much flatus.  No nausea.  Tolerating TFs.    OBJECTIVE:         Vital signs in last 24 hours:    Temp:  [98.2 F (36.8 C)-98.9 F (37.2 C)] 98.5 F (36.9 C) (07/05 0743) Pulse Rate:  [51-71] 64 (07/05 0757) Resp:  [16-17] 17 (07/05 0757) BP: (114-156)/(39-57) 127/49 (07/05 0757) SpO2:  [98 %-100 %] 100 % (07/05 0757) FiO2 (%):  [28 %] 28 % (07/05 0757) Last BM Date: 04/24/21 Filed Weights   04/24/21 0500  Weight: 90.9 kg   General: NAD.  Comfortable.  Trach in place.  Not able to vocalize but whispers and gestures appropriately.     Heart: RRR Chest: trach in place.  No resp distress or cough Abdomen: soft, moderately distended.  NT.  Echoing BS.    Extremities: no CCE Neuro/Psych:  appropriate.  Alert.    Intake/Output from previous day: 07/04 0701 - 07/05 0700 In: 2480.6 [I.V.:1603.1; NG/GT:327.7; IV Piggyback:399.9] Out: 1040 [Urine:1040]  Intake/Output this shift: No intake/output data recorded.  Lab Results: Recent Labs    04/23/21 2020 04/26/21 0347  WBC 7.1 6.4  HGB 10.7* 9.1*  HCT 33.4* 27.4*  PLT 224 179   BMET Recent Labs    04/24/21 2018 04/25/21 0611 04/25/21 2125 04/26/21 0347  NA 144 142  --  138  K 3.4* 3.3* 3.4* 3.2*  CL 117* 114*  --  109  CO2 23 24  --  24  GLUCOSE 72 98  --  124*  BUN 6* <5*  --  <5*  CREATININE <0.30* <0.30*  --  <0.30*  CALCIUM 8.0* 8.0*  --  7.7*   LFT Recent Labs    04/23/21 2020  PROT 5.6*  ALBUMIN 2.2*  AST 11*  ALT 9  ALKPHOS 75  BILITOT 0.7   PT/INR No results for input(s): LABPROT, INR in  the last 72 hours. Hepatitis Panel No results for input(s): HEPBSAG, HCVAB, HEPAIGM, HEPBIGM in the last 72 hours.  Studies/Results: No results found.  Scheduled Meds:  DULoxetine  30 mg Oral BID   enoxaparin (LOVENOX) injection  40 mg Subcutaneous Q24H   feeding supplement (VITAL 1.5 CAL)  1,000 mL Per Tube Q24H   mouth rinse  15 mL Mouth Rinse QID   melatonin  3 mg Per Tube QHS   mupirocin ointment  1 application Nasal BID   pantoprazole (PROTONIX) IV  40 mg Intravenous Daily   polyethylene glycol  17 g Per Tube BID   potassium chloride  20 mEq Per Tube Q4H   pregabalin  100 mg Per Tube TID   sodium chloride flush  10-40 mL Intracatheter Q12H   Continuous Infusions:  0.9 % NaCl with KCl 20 mEq / L 30 mL/hr at 04/26/21 0600   dextrose 40 mL/hr at 04/26/21 0600   potassium chloride 10 mEq (04/26/21 0713)   PRN Meds:.docusate, hydrOXYzine, ipratropium-albuterol, ondansetron (ZOFRAN) IV, sodium chloride flush   ASSESMENT:     Colonic ileus w distention.  Chronic constipation.   Multifactorial including bed ridden, chronic fentanyl, Dilaudid, po iron, psych and other meds.  Mirlax purge initiated 7/4.  Resides at [facility].  Colonoscopy 2009 in [place].  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   Hypokalemia.  K 3.4 >> 3.2.  Mag, phos wnl, though mag of 1.8 is not at suggested goal of 2.      [place] anemia.     PEG placed 09/2019.      signif spinal dz.  S/p surgeries, s/p spinal cord stim placement.     PLAN    Continue efforts to correct K and achieve mag of >/= 2.    Consider stopping po iron altogether (currently on hold) and giving parenteral iron as needed.    Movinprep via PEG.  Accompany by IV Reglan x 3 doses.  D/w RN.    Jennye Moccasin  04/26/2021, 8:30 AM Phone [phone]   GI ATTENDING  Interval history data reviewed.  Case reviewed with Dr. Nathaniel Man.  Agree  with interval progress note and recommendations as outlined above including aggressive replacement of potassium.  We will continue to follow.  Wilhemina Bonito. Eda Keys., M.D. [facility] Division of Gastroenterology

## 2021-04-26 NOTE — Progress Notes (Signed)
PROGRESS NOTE    Andrew Rivera  BXI:[id number] DOB: 26-Feb-1951 DOA: 04/23/2021 PCP: Larena Glassman, MD    Chief Complaint  Patient presents with   Constipation    Brief Narrative:  70 yo man with hx of chronic trach on chronic vent from 08/2019, hx of constipation, and colonic dilation, Hx of PNA, critical illness myopathy, here with increasing abdominal distention and abdominal pain. CT of the abdomen shows colonic distention, colonic ileus without obstruction or perforation.. he was initially admitted to [facility] service as he vent dependent.  He was transferred to [facility] on 04/25/21.   Assessment & Plan:   Active Problems:   Ileus ([facility])   Colonic distention, ileus Patient reports abdominal distention for more than 2 months. BM on 04/24/21/  GI  consulted to see if he is a candidate for decompression Recommended MOVIPREP and laxatives.  Tube feeds on hold for now.      Chronic respiratory failure, vent dependent Patient currently denies any chest pain or shortness of breath and stable on current settings    Hypokalemia and hypomagnesemia Replaced, REPEAT in am.     [place] protein calorie malnutrition Dietary will be consulted   Nutrition  via tube feeds vis PEG.     Anemia of chronic disease Hemoglobin around 10.          DVT prophylaxis: LMWH Code Status: (Full CODE) Family Communication: none at bedside. Discussed with wife at bedside.  Disposition:   Status is: Inpatient  Remains inpatient appropriate because:Ongoing diagnostic testing needed not appropriate for outpatient work up  Dispo: The patient is from: Home              Anticipated d/c is to:  pending              Patient currently is not medically stable to d/c.   Difficult to place patient No       Consultants:  Gi  pccm  Procedures: CT ABD  Antimicrobials: none.    Subjective: No change from yesterday.   Objective: Vitals:   04/26/21 0743 04/26/21 0757 04/26/21 1135  04/26/21 1144  BP:  (!) 127/49 (!) 129/56   Pulse:  64 70   Resp:  17 16   Temp: 98.5 F (36.9 C)   98.5 F (36.9 C)  TempSrc: Oral   Axillary  SpO2:  100% 100%   Weight:      Height:        Intake/Output Summary (Last 24 hours) at 04/26/2021 1407 Last data filed at 04/26/2021 0600 Gross per 24 hour  Intake 1836.1 ml  Output 740 ml  Net 1096.1 ml    Filed Weights   04/24/21 0500  Weight: 90.9 kg    Examination:  General exam: Elderly gentleman not in any kind of distress Respiratory system: Air entry fair bilateral, s/p trach, vent dependent Cardiovascular system: S1-S2 heard, regular rate rhythm, no JVD, no pedal edema Gastrointestinal system: Abdomen is soft, nontender, distended, bowel sounds minimal Central nervous system: Alert and answering questions appropriately Extremities: Cyanosis Skin: no rashes seen.  Psychiatry:  Mood is appropriate.     Data Reviewed: I have personally reviewed following labs and imaging studies  CBC: Recent Labs  Lab 04/23/21 2020 04/26/21 0347  WBC 7.1 6.4  NEUTROABS 4.6 4.1  HGB 10.7* 9.1*  HCT 33.4* 27.4*  MCV 92.5 91.3  PLT 224 179     Basic Metabolic Panel: Recent Labs  Lab 04/23/21 2020 04/24/21 0953 04/24/21 2018 04/25/21  4627 04/25/21 1055 04/25/21 2125 04/26/21 0347  NA 145 144 144 142  --   --  138  K 2.5* 2.8* 3.4* 3.3*  --  3.4* 3.2*  CL 116* 116* 117* 114*  --   --  109  CO2 24 24 23 24   --   --  24  GLUCOSE 85 86 72 98  --   --  124*  BUN 8 7* 6* <5*  --   --  <5*  CREATININE <0.30* <0.30* <0.30* <0.30*  --   --  <0.30*  CALCIUM 8.3* 8.2* 8.0* 8.0*  --   --  7.7*  MG 1.8  --   --   --  1.7 1.9 1.8  PHOS  --   --   --   --  3.0 2.8 2.8     GFR: CrCl cannot be calculated (This lab value cannot be used to calculate CrCl because it is not a number: <0.30).  Liver Function Tests: Recent Labs  Lab 04/23/21 2020  AST 11*  ALT 9  ALKPHOS 75  BILITOT 0.7  PROT 5.6*  ALBUMIN 2.2*      CBG: Recent Labs  Lab 04/25/21 1919 04/25/21 2319 04/26/21 0316 04/26/21 0725 04/26/21 1124  GLUCAP 93 130* 95 117* 114*      Recent Results (from the past 240 hour(s))  MRSA Next Gen by PCR, Nasal     Status: Abnormal   Collection Time: 04/24/21  2:03 AM   Specimen: Nasal Mucosa; Nasal Swab  Result Value Ref Range Status   MRSA by PCR Next Gen DETECTED (A) NOT DETECTED Final    Comment: RESULT CALLED TO, READ BACK BY AND VERIFIED WITH: RN M TOLER AT 0408 04/24/2021 BY L BENFIELD (NOTE) The GeneXpert MRSA Assay (FDA approved for NASAL specimens only), is one component of a comprehensive MRSA colonization surveillance program. It is not intended to diagnose MRSA infection nor to guide or monitor treatment for MRSA infections. Test performance is not FDA approved in patients less than 76 years old. Performed at [facility], [address] [place], [place]           Radiology Studies: No results found.      Scheduled Meds:  DULoxetine  30 mg Oral BID   enoxaparin (LOVENOX) injection  40 mg Subcutaneous Q24H   feeding supplement (VITAL 1.5 CAL)  1,000 mL Per Tube Q24H   mouth rinse  15 mL Mouth Rinse QID   melatonin  3 mg Per Tube QHS   metoCLOPramide (REGLAN) injection  10 mg Intravenous Q6H   mupirocin ointment  1 application Nasal BID   pantoprazole sodium  40 mg Per Tube Daily   [START ON 04/27/2021] polyethylene glycol  17 g Per Tube BID   pregabalin  100 mg Per Tube TID   sodium chloride flush  10-40 mL Intracatheter Q12H   Continuous Infusions:  0.9 % NaCl with KCl 20 mEq / L 30 mL/hr at 04/26/21 0600   dextrose 40 mL/hr at 04/26/21 0600     LOS: 2 days        06/27/21, MD [facility]   To contact the attending provider between 7A-7P or the covering provider during after hours 7P-7A, please log into the web site [email or web address] and access using universal [place] password for that web site. If you do not  have the password, please call the hospital operator.  04/26/2021, 2:07 PM

## 2021-04-27 DIAGNOSIS — K5900 Constipation, unspecified: Secondary | ICD-10-CM

## 2021-04-27 DIAGNOSIS — E876 Hypokalemia: Secondary | ICD-10-CM

## 2021-04-27 DIAGNOSIS — K567 Ileus, unspecified: Secondary | ICD-10-CM | POA: Diagnosis not present

## 2021-04-27 DIAGNOSIS — J9611 Chronic respiratory failure with hypoxia: Secondary | ICD-10-CM | POA: Diagnosis not present

## 2021-04-27 DIAGNOSIS — R140 Abdominal distension (gaseous): Secondary | ICD-10-CM

## 2021-04-27 LAB — CBC
HCT: 28.6 % — ABNORMAL LOW (ref 39.0–52.0)
Hemoglobin: 9.6 g/dL — ABNORMAL LOW (ref 13.0–17.0)
MCH: 30.2 pg (ref 26.0–34.0)
MCHC: 33.6 g/dL (ref 30.0–36.0)
MCV: 89.9 fL (ref 80.0–100.0)
Platelets: 204 10*3/uL (ref 150–400)
RBC: 3.18 MIL/uL — ABNORMAL LOW (ref 4.22–5.81)
RDW: 14.3 % (ref 11.5–15.5)
WBC: 5.9 10*3/uL (ref 4.0–10.5)
nRBC: 0 % (ref 0.0–0.2)

## 2021-04-27 LAB — GLUCOSE, CAPILLARY
Glucose-Capillary: 98 mg/dL (ref 70–99)
Glucose-Capillary: 90 mg/dL (ref 70–99)
Glucose-Capillary: 92 mg/dL (ref 70–99)
Glucose-Capillary: 110 mg/dL — ABNORMAL HIGH (ref 70–99)
Glucose-Capillary: 95 mg/dL (ref 70–99)
Glucose-Capillary: 109 mg/dL — ABNORMAL HIGH (ref 70–99)

## 2021-04-27 LAB — MAGNESIUM: Magnesium: 1.8 mg/dL (ref 1.7–2.4)

## 2021-04-27 LAB — RENAL FUNCTION PANEL
Albumin: 1.8 g/dL — ABNORMAL LOW (ref 3.5–5.0)
Anion gap: 3 — ABNORMAL LOW (ref 5–15)
BUN: <5 mg/dL — ABNORMAL LOW (ref 8–23)
CO2: 24 mmol/L (ref 22–32)
Calcium: 7.7 mg/dL — ABNORMAL LOW (ref 8.9–10.3)
Chloride: 110 mmol/L (ref 98–111)
Creatinine, Ser: <0.3 mg/dL — ABNORMAL LOW (ref 0.61–1.24)
Glucose, Bld: 98 mg/dL (ref 70–99)
Phosphorus: 3.3 mg/dL (ref 2.5–4.6)
Potassium: 3.4 mmol/L — ABNORMAL LOW (ref 3.5–5.1)
Sodium: 137 mmol/L (ref 135–145)

## 2021-04-27 MED ORDER — POTASSIUM CHLORIDE 20 MEQ PO PACK
40.0000 meq | PACK | Freq: Two times a day (BID) | ORAL | Status: DC
  Administered 2021-04-27 – 2021-04-29 (×5): 40 meq
  Filled 2021-04-27 (×5): qty 2

## 2021-04-27 MED ORDER — MAGNESIUM SULFATE 2 GM/50ML IV SOLN
2.0000 g | Freq: Once | INTRAVENOUS | Status: AC
  Administered 2021-04-27: 2 g via INTRAVENOUS
  Filled 2021-04-27: qty 50

## 2021-04-27 MED ORDER — POTASSIUM CHLORIDE 20 MEQ PO PACK: 40.0000 meq | PACK | Freq: Once | ORAL | Status: DC

## 2021-04-27 NOTE — Progress Notes (Signed)
[facility] ADULT ICU REPLACEMENT PROTOCOL   The patient does apply for the [facility] Adult ICU Electrolyte Replacment Protocol based on the criteria listed below:   1. Is GFR >/= 30 ml/min? Yes  Patient's GFR today is not calculated 2. Is SCr </= 2? No. Patient's SCr is <0.30 ml/kg/hr 3. Did SCr increase >/= 0.5 in 24 hours? No. 4. Abnormal electrolyte(s): mag 1.8 5. Ordered repletion with: standing orders 6. If a panic level lab has been reported, has the CCM MD in charge been notified? No..   Physician:    Melvern Banker 04/27/2021 6:30 AM

## 2021-04-27 NOTE — Progress Notes (Signed)
   NAME:  TORI CUPPS, MRN:  [id number], DOB:  1951-06-28, LOS: 3 ADMISSION DATE:  04/23/2021, CONSULTATION DATE:  04/24/21 REFERRING MD:  ED, CHIEF COMPLAINT:  abdominal pain   History of Present Illness:  70 yo man with hx of chronic trach, chronic vent, hx of constipation issues and colonic dilation, Hx of PNA, critical illness myopathy, here with increasing abdominal distention and abdominal pain.  Pain waxes and wanes.    Constipation, ileus.  Recently in ED 12/2020 for constipation, disimpacted.    Hypokalemia  In ED, received 1L NS, KCL so far.   Pertinent  Medical History  Chronic pain IJ thromboembolism  Hx of septic shock Chronic respiratory failure.  Recently in ED 3/22 for constipation, colonic dilation (fecal impaction vs ileus), BLL pna,  Hx of critical illness myopathy  Hx of spinal surgeries  Admitted in 06/2019 and 08/2019 with MSSA bacteriemia, tracheostomy done then.  Hx of pressure wound  Former smoker  Tylenol, cymbalta, feso4, dilaudid, duonebs, ativan, melatonin, protonix, miralax, lyrica, senna, ambien  Significant Hospital Events: Including procedures, antibiotic start and stop dates in addition to other pertinent events   7/2 Admit Diarrhea with treatment  Interim History / Subjective:   Diarrhea overnight, flexi-seal placed  Objective   Blood pressure (!) 155/53, pulse (!) 59, temperature 97.8 F (36.6 C), temperature source Axillary, resp. rate 16, height 5\' 8"  (1.727 m), weight 96 kg, SpO2 100 %.    Vent Mode: PRVC FiO2 (%):  [28 %] 28 % Set Rate:  [16 bmp] 16 bmp Vt Set:  [540 mL] 540 mL PEEP:  [5 cmH20] 5 cmH20 Plateau Pressure:  [16 cmH20-20 cmH20] 20 cmH20   Intake/Output Summary (Last 24 hours) at 04/27/2021 0810 Last data filed at 04/27/2021 0600 Gross per 24 hour  Intake 1589.14 ml  Output 4825 ml  Net -3235.86 ml   Filed Weights   04/24/21 0500 04/27/21 0500  Weight: 90.9 kg 96 kg    Examination: Gen:      No overnight  events HEENT:   Anicteric, Neck:     No thyromegaly, no adenopathy Lungs:    Clear breath sounds bilaterally CV:         S1-S2 appreciated Abd:      Bowel sounds appreciated Ext:    No edema; adequate peripheral perfusion Skin:      Warm and dry; no rash Neuro: alert and oriented x 3  Resolved Hospital Problem list     Assessment & Plan:  Colonic ileus Abdominal distention -Resolving -Diarrhea post Moviprep  Chronic respiratory failure on ventilator -Continue same vent settings -No respiratory decompensation  Electrolyte derangement Hypokalemia -Continue to replete  Severe protein calorie malnutrition Present on admission Hypoalbuminemia  Tube feeding can be resumed De-escalate on meds for constipation  PCCM will continue to follow for chronic vent status  Best Practice (right click and "Reselect all SmartList Selections" daily)   Diet/type: tubefeeds DVT prophylaxis: LMWH GI prophylaxis: PPI Lines: N/A Foley:  N/A Code Status:  full code Last date of multidisciplinary goals of care discussion [per primary]  06/28/21, MD [place] P[facility]M Pager: See Virl Diamond

## 2021-04-27 NOTE — TOC Progression Note (Addendum)
Transition of Care [facility]) - Progression Note    Patient Details  Name: Andrew Rivera MRN: [id number] Date of Birth: 09-23-1951  [facility] of [facility]) CM/SW Contact  Erin Sons, [place] Phone Number: 04/27/2021, 2:53 PM  Clinical Narrative:     CSW called pt spouse regarding TOC consult; no answer, left message  [phone]: Spouse called CSW back and explained she has been wanting pt to have EMG procedure done at [facility] but that they are not able to have procedure done at facility. Spouse is requesting any additional resources that could help pt get EMG done including potential for changing SNF's. CSW explained the limited number of SNF's in the state and explained that [facility] does not accept pt's who are weaning. [place] far [place] for family. CSW will update pt spouse if any possible solutions identified.         Expected Discharge Plan and Services                                                 Social Determinants of Health (SDOH) Interventions    Readmission Risk Interventions No flowsheet data found.

## 2021-04-27 NOTE — Progress Notes (Signed)
PROGRESS NOTE    Andrew Rivera  LMB:[id number] DOB: 1951/07/02 DOA: 04/23/2021 PCP: Larena Glassman, MD    Brief Narrative:  70 yo man with hx of chronic trach on chronic vent from [facility], hx of constipation, and colonic dilation, Hx of PNA, critical illness myopathy, here with increasing abdominal distention and abdominal pain. CT of the abdomen shows colonic distention, colonic ileus without obstruction or perforation.. he was initially admitted to [facility] service as he vent dependent.  He was transferred to [facility] on 04/25/21.   Assessment & Plan:     Colonic distention, ileus GI  consulted to see if he is a candidate for decompression Recommended MOVIPREP and laxatives which are helping him have BMs - tube feeds resumed- goal 58ml /hr  Chronic respiratory failure, vent dependent - appreciate PCCM management   Hypokalemia and hypomagnesemia Replacing- follow  Hypoglycemia - on D10 until tube feeds advanced to goal  Nutrition  via tube feeds/ PEG - Prosource  Anemia of chronic disease Hemoglobin around 10.     DVT prophylaxis: LMWH Code Status: (Full CODE) Family Communication:   Disposition:   Status is: Inpatient  Remains inpatient appropriate because:Ongoing diagnostic testing needed not appropriate for outpatient work up  Dispo: The patient is from: Home              Anticipated d/c is to:  pending              Patient currently is not medically stable to d/c.   Difficult to place patient No       Consultants:  Gi  pccm  Procedures: CT ABD  Antimicrobials: none.    Subjective: No complaints  Objective: Vitals:   04/27/21 1700 04/27/21 1730 04/27/21 1800 04/27/21 1830  BP: (!) 166/53 (!) 156/60 (!) 146/63 (!) 142/60  Pulse: 68 74 72 76  Resp: 16 16 16 16   Temp:      TempSrc:      SpO2: 100% 99% 99% 91%  Weight:      Height:        Intake/Output Summary (Last 24 hours) at 04/27/2021 1907 Last data filed at 04/27/2021 1900 Gross per 24 hour   Intake 1919.89 ml  Output 4425 ml  Net -2505.11 ml    Filed Weights   04/24/21 0500 04/27/21 0500  Weight: 90.9 kg 96 kg    Examination:  General exam: no acute distress Respiratory system: Air entry fair bilateral, s/p trach, vent dependent Cardiovascular system: S1-S2 heard, regular rate rhythm, no JVD, no pedal edema Gastrointestinal system: Abdomen is soft, nontender, distended, bowel sounds minimal Central nervous system: Alert and answering questions appropriately Extremities: Cyanosis Skin: no rashes seen.  Psychiatry:  Mood is appropriate.     Data Reviewed: I have personally reviewed following labs and imaging studies  CBC: Recent Labs  Lab 04/23/21 2020 04/26/21 0347 04/27/21 0716  WBC 7.1 6.4 5.9  NEUTROABS 4.6 4.1  --   HGB 10.7* 9.1* 9.6*  HCT 33.4* 27.4* 28.6*  MCV 92.5 91.3 89.9  PLT 224 179 204     Basic Metabolic Panel: Recent Labs  Lab 04/24/21 0953 04/24/21 2018 04/25/21 0611 04/25/21 1055 04/25/21 2125 04/26/21 0347 04/26/21 1759 04/27/21 0436 04/27/21 0716  NA 144 144 142  --   --  138  --   --  137  K 2.8* 3.4* 3.3*  --  3.4* 3.2*  --   --  3.4*  CL 116* 117* 114*  --   --  109  --   --  110  CO2 24 23 24   --   --  24  --   --  24  GLUCOSE 86 72 98  --   --  124*  --   --  98  BUN 7* 6* <5*  --   --  <5*  --   --  <5*  CREATININE <0.30* <0.30* <0.30*  --   --  <0.30*  --   --  <0.30*  CALCIUM 8.2* 8.0* 8.0*  --   --  7.7*  --   --  7.7*  MG  --   --   --  1.7 1.9 1.8 2.0 1.8  --   PHOS  --   --   --  3.0 2.8 2.8 3.1  --  3.3     GFR: CrCl cannot be calculated (This lab value cannot be used to calculate CrCl because it is not a number: <0.30).  Liver Function Tests: Recent Labs  Lab 04/23/21 2020 04/27/21 0716  AST 11*  --   ALT 9  --   ALKPHOS 75  --   BILITOT 0.7  --   PROT 5.6*  --   ALBUMIN 2.2* 1.8*     CBG: Recent Labs  Lab 04/26/21 2322 04/27/21 0318 04/27/21 0719 04/27/21 1109 04/27/21 1507   GLUCAP 87 98 90 92 110*      Recent Results (from the past 240 hour(s))  MRSA Next Gen by PCR, Nasal     Status: Abnormal   Collection Time: 04/24/21  2:03 AM   Specimen: Nasal Mucosa; Nasal Swab  Result Value Ref Range Status   MRSA by PCR Next Gen DETECTED (A) NOT DETECTED Final    Comment: RESULT CALLED TO, READ BACK BY AND VERIFIED WITH: RN M TOLER AT 0408 04/24/2021 BY L BENFIELD (NOTE) The GeneXpert MRSA Assay (FDA approved for NASAL specimens only), is one component of a comprehensive MRSA colonization surveillance program. It is not intended to diagnose MRSA infection nor to guide or monitor treatment for MRSA infections. Test performance is not FDA approved in patients less than 66 years old. Performed at [facility], [address], [place], [place]           Radiology Studies: No results found.      Scheduled Meds:  DULoxetine  30 mg Oral BID   enoxaparin (LOVENOX) injection  40 mg Subcutaneous Q24H   feeding supplement (VITAL 1.5 CAL)  1,000 mL Per Tube Q24H   mouth rinse  15 mL Mouth Rinse QID   melatonin  3 mg Per Tube QHS   mupirocin ointment  1 application Nasal BID   pantoprazole sodium  40 mg Per Tube Daily   polyethylene glycol  17 g Per Tube BID   potassium chloride  40 mEq Per Tube BID   pregabalin  100 mg Per Tube TID   sodium chloride flush  10-40 mL Intracatheter Q12H   Continuous Infusions:  0.9 % NaCl with KCl 20 mEq / L 30 mL/hr at 04/27/21 1900   dextrose 40 mL/hr at 04/27/21 1900     LOS: 3 days        06/28/21, MD [facility]   To contact the attending provider between 7A-7P or the covering provider during after hours 7P-7A, please log into the web site [email or web address] and access using universal Malin password for that web site. If you do not have the password,  please call the hospital operator.  04/27/2021, 7:07 PM

## 2021-04-27 NOTE — Progress Notes (Signed)
Pharmacy Electrolyte Replacement  Recent Labs:  Recent Labs    04/27/21 0436 04/27/21 0716  K  --  3.4*  MG 1.8  --   PHOS  --  3.3  CREATININE  --  <0.30*    Low Critical Values (K </= 2.5, Phos </= 1, Mg </= 1) Present: None   Plan: - K 3.4 - noted bowel prep with diarrhea - KCl 40 mEq PT x 1 - Consider recheck with AM labs  Thank you for allowing pharmacy to be a part of this patient's care.  Georgina Pillion, PharmD, BCPS Clinical Pharmacist Clinical phone for 04/27/2021: [phone] 04/27/2021 11:27 AM   **Pharmacist phone directory can now be found on [email or web address] ([email or web address]).  Listed under [facility].

## 2021-04-27 NOTE — Progress Notes (Addendum)
Daily Rounding Note  04/27/2021, 12:28 PM  LOS: 3 days   SUBJECTIVE:   Chief complaint:   colonic ileus, hypokalemia   After several hours post initiation of moviprep, started to move his bowel: brown, watery stool.  Abdominal distention improved.   Pt feeling better.  Brown, watery stools continue.    OBJECTIVE:         Vital signs in last 24 hours:    Temp:  [97.8 F (36.6 C)-98.8 F (37.1 C)] 98.3 F (36.8 C) (07/06 1125) Pulse Rate:  [55-72] 68 (07/06 1205) Resp:  [14-21] 16 (07/06 1205) BP: (111-167)/(42-64) 125/53 (07/06 1205) SpO2:  [86 %-100 %] 100 % (07/06 1205) FiO2 (%):  [28 %] 28 % (07/06 1205) Weight:  [96 kg] 96 kg (07/06 0500) Last BM Date: 04/27/21 Filed Weights   04/24/21 0500 04/27/21 0500  Weight: 90.9 kg 96 kg   General: alert, comfortable, trach in place.   Heart: RRR Chest: no labored breathing, trach/vent in place Abdomen: softer, less distended.  NT.  BS more normal but still echoing quality.  Liquid, watery brown stool in flexi-seal  Neuro/Psych:  appropriate, vocalizes but not able to hear sound, wife at bedside acted as "interpretor".  Intake/Output from previous day: 07/05 0701 - 07/06 0700 In: 1679.2 [I.V.:1599.2; NG/GT:80] Out: 4825 [Urine:2600; Stool:2225]  Intake/Output this shift: Total I/O In: 472.8 [I.V.:342.9; NG/GT:80; IV Piggyback:50] Out: -   Lab Results: Recent Labs    04/26/21 0347 04/27/21 0716  WBC 6.4 5.9  HGB 9.1* 9.6*  HCT 27.4* 28.6*  PLT 179 204   BMET Recent Labs    04/25/21 0611 04/25/21 2125 04/26/21 0347 04/27/21 0716  NA 142  --  138 137  K 3.3* 3.4* 3.2* 3.4*  CL 114*  --  109 110  CO2 24  --  24 24  GLUCOSE 98  --  124* 98  BUN <5*  --  <5* <5*  CREATININE <0.30*  --  <0.30* <0.30*  CALCIUM 8.0*  --  7.7* 7.7*   LFT Recent Labs    04/27/21 0716  ALBUMIN 1.8*   PT/INR No results for input(s): LABPROT, INR in the last 72  hours. Hepatitis Panel No results for input(s): HEPBSAG, HCVAB, HEPAIGM, HEPBIGM in the last 72 hours.  Studies/Results: No results found.  Scheduled Meds:  DULoxetine  30 mg Oral BID   enoxaparin (LOVENOX) injection  40 mg Subcutaneous Q24H   feeding supplement (VITAL 1.5 CAL)  1,000 mL Per Tube Q24H   mouth rinse  15 mL Mouth Rinse QID   melatonin  3 mg Per Tube QHS   mupirocin ointment  1 application Nasal BID   pantoprazole sodium  40 mg Per Tube Daily   polyethylene glycol  17 g Per Tube BID   potassium chloride  40 mEq Per Tube BID   pregabalin  100 mg Per Tube TID   sodium chloride flush  10-40 mL Intracatheter Q12H   Continuous Infusions:  0.9 % NaCl with KCl 20 mEq / L 30 mL/hr at 04/27/21 1100   dextrose 40 mL/hr at 04/27/21 1100   PRN Meds:.docusate, hydrOXYzine, ipratropium-albuterol, ondansetron (ZOFRAN) IV, sodium chloride flush   ASSESMENT:     Colonic ileus w distention. Chronic constipation.   Multifactorial including bed ridden, chronic fentanyl, Dilaudid, po iron, psych and other meds.  Mirlax purge initiated 7/4.  Resides at [facility].  Colonoscopy 2009 in [place].  PTA meds of Lactulose  prn, Miralax 1 x daily, Senokot bid.  Added moviprep via tube "bowel prep'7.5.     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    Hypokalemia.  K 3.4 >> 3.2 >> 3.4 so still hypokalemic.  Mag, phos wnl.  Mag of 1.8 is not at suggested goal of 2.       Anderson anemia.  Hgb 9.6.      PEG placed 09/2019.       Signif spinal dz.  S/p surgeries, s/p spinal cord stim placement.   non-ambulatory, bed-ridden.     PLAN    Continue Miralax 17 g bid.  May need to titrate to higher dosing prn.  GI signing off, no plans for outpt fup.     Continue to supplement potassium, magnesium.  Goal magnesium is 2.      Andrew Rivera  04/27/2021, 12:28 PM Phone [phone]  GI ATTENDING  Interval history data reviewed.  Agree with interval progress  note as outlined above.  Ileus better.  Please keep electrolytes in the medium to high normal range.  Continue with MiraLAX.  Nothing further to add.  We will sign off.  Wilhemina Bonito. Eda Keys., M.D. [facility] Division of Gastroenterology

## 2021-04-27 NOTE — Progress Notes (Signed)
eLink Physician-Brief Progress Note Patient Name: Andrew Rivera DOB: 03-Apr-1951 MRN: [id number]   Date of Service  04/27/2021  HPI/Events of Note  Patient with copious watery stools, bedside RN requesting Flexiseal.  eICU Interventions  Flexiseal ordered.        Thomasene Lot [name] 04/27/2021, 12:45 AM

## 2021-04-28 DIAGNOSIS — K567 Ileus, unspecified: Secondary | ICD-10-CM | POA: Diagnosis not present

## 2021-04-28 DIAGNOSIS — J9611 Chronic respiratory failure with hypoxia: Secondary | ICD-10-CM | POA: Diagnosis not present

## 2021-04-28 DIAGNOSIS — K5903 Drug induced constipation: Secondary | ICD-10-CM | POA: Diagnosis not present

## 2021-04-28 DIAGNOSIS — Z9911 Dependence on respirator [ventilator] status: Secondary | ICD-10-CM | POA: Diagnosis not present

## 2021-04-28 DIAGNOSIS — G729 Myopathy, unspecified: Secondary | ICD-10-CM

## 2021-04-28 DIAGNOSIS — R140 Abdominal distension (gaseous): Secondary | ICD-10-CM | POA: Diagnosis not present

## 2021-04-28 LAB — BASIC METABOLIC PANEL
Anion gap: 2 — ABNORMAL LOW (ref 5–15)
BUN: <5 mg/dL — ABNORMAL LOW (ref 8–23)
CO2: 24 mmol/L (ref 22–32)
Calcium: 7.8 mg/dL — ABNORMAL LOW (ref 8.9–10.3)
Chloride: 111 mmol/L (ref 98–111)
Creatinine, Ser: <0.3 mg/dL — ABNORMAL LOW (ref 0.61–1.24)
Glucose, Bld: 113 mg/dL — ABNORMAL HIGH (ref 70–99)
Potassium: 4.3 mmol/L (ref 3.5–5.1)
Sodium: 137 mmol/L (ref 135–145)

## 2021-04-28 LAB — GLUCOSE, CAPILLARY
Glucose-Capillary: 116 mg/dL — ABNORMAL HIGH (ref 70–99)
Glucose-Capillary: 120 mg/dL — ABNORMAL HIGH (ref 70–99)
Glucose-Capillary: 115 mg/dL — ABNORMAL HIGH (ref 70–99)
Glucose-Capillary: 111 mg/dL — ABNORMAL HIGH (ref 70–99)
Glucose-Capillary: 118 mg/dL — ABNORMAL HIGH (ref 70–99)
Glucose-Capillary: 113 mg/dL — ABNORMAL HIGH (ref 70–99)

## 2021-04-28 LAB — MAGNESIUM: Magnesium: 2.1 mg/dL (ref 1.7–2.4)

## 2021-04-28 MED ORDER — VITAL 1.5 CAL PO LIQD
1000.0000 mL | ORAL | Status: DC
  Administered 2021-04-28 – 2021-05-10 (×9): 1000 mL
  Filled 2021-04-28: qty 1000

## 2021-04-28 MED ORDER — PROSOURCE TF PO LIQD
45.0000 mL | Freq: Three times a day (TID) | ORAL | Status: DC
  Administered 2021-04-28 – 2021-06-09 (×127): 45 mL
  Filled 2021-04-28 (×128): qty 45

## 2021-04-28 MED ORDER — LORAZEPAM 0.5 MG PO TABS
0.5000 mg | ORAL_TABLET | Freq: Three times a day (TID) | ORAL | Status: DC | PRN
  Administered 2021-04-28 – 2021-06-09 (×13): 0.5 mg
  Filled 2021-04-28 (×14): qty 1

## 2021-04-28 MED ORDER — DOXAZOSIN MESYLATE 2 MG PO TABS
2.0000 mg | ORAL_TABLET | Freq: Every day | ORAL | Status: DC
  Administered 2021-04-28 – 2021-05-29 (×31): 2 mg
  Filled 2021-04-28 (×34): qty 1

## 2021-04-28 MED ORDER — FENTANYL 25 MCG/HR TD PT72
1.0000 | MEDICATED_PATCH | TRANSDERMAL | Status: DC
  Administered 2021-04-28 – 2021-06-09 (×15): 1 via TRANSDERMAL
  Filled 2021-04-28 (×15): qty 1

## 2021-04-28 MED ORDER — SODIUM CHLORIDE 0.9 % IV SOLN: INTRAVENOUS | Status: DC

## 2021-04-28 MED ORDER — POLYETHYLENE GLYCOL 3350 17 G PO PACK
17.0000 g | PACK | Freq: Every day | ORAL | Status: DC
  Administered 2021-04-29: 17 g
  Filled 2021-04-28: qty 1

## 2021-04-28 MED ORDER — FREE WATER
100.0000 mL | Status: DC
  Administered 2021-04-28 – 2021-06-09 (×252): 100 mL

## 2021-04-28 NOTE — Progress Notes (Signed)
   NAME:  Andrew Rivera, MRN:  [id number], DOB:  1951/08/20, LOS: 4 ADMISSION DATE:  04/23/2021, CONSULTATION DATE:  04/24/21 REFERRING MD:  ED, CHIEF COMPLAINT:  abdominal pain   History of Present Illness:  70 yo man with hx of chronic trach, chronic vent, hx of constipation issues and colonic dilation, Hx of PNA, critical illness myopathy, here with increasing abdominal distention and abdominal pain.  Pain waxes and wanes.    Constipation, ileus.  Recently in ED 12/2020 for constipation, disimpacted.    Hypokalemia  In ED, received 1L NS, KCL so far.   Pertinent  Medical History  Chronic pain IJ thromboembolism  Hx of septic shock Chronic respiratory failure.  Recently in ED 3/22 for constipation, colonic dilation (fecal impaction vs ileus), BLL pna,  Hx of critical illness myopathy  Hx of spinal surgeries  Admitted in 06/2019 and 08/2019 with MSSA bacteriemia, tracheostomy done then.  Hx of pressure wound  Former smoker  Tylenol, cymbalta, feso4, dilaudid, duonebs, ativan, melatonin, protonix, miralax, lyrica, senna, ambien  Significant Hospital Events: Including procedures, antibiotic start and stop dates in addition to other pertinent events   7/2 Admit Diarrhea with treatment  Interim History / Subjective:   Diarrhea is resolving Has Flexi-Seal in place No overnight event  Objective   Blood pressure (!) 141/56, pulse 68, temperature 98.9 F (37.2 C), temperature source Oral, resp. rate 16, height 5\' 8"  (1.727 m), weight 96 kg, SpO2 95 %.    Vent Mode: PRVC FiO2 (%):  [28 %] 28 % Set Rate:  [16 bmp] 16 bmp Vt Set:  [550 mL] 550 mL PEEP:  [5 cmH20] 5 cmH20 Plateau Pressure:  [16 cmH20-21 cmH20] 18 cmH20   Intake/Output Summary (Last 24 hours) at 04/28/2021 1311 Last data filed at 04/28/2021 1200 Gross per 24 hour  Intake 2216.87 ml  Output 2850 ml  Net -633.13 ml   Filed Weights   04/24/21 0500 04/27/21 0500  Weight: 90.9 kg 96 kg    Examination: Gen:       No overnight events HEENT:   Anicteric, moist oral mucosa Neck:     No thyromegaly, no adenopathy Lungs:    Clear breath sounds bilaterally S1 CV:         2 appreciated, no murmur Abd:      Bowel sounds appreciated Ext:    No edema; adequate peripheral perfusion Skin:      Warm and dry; no rash Neuro: alert and oriented x 3  Resolved Hospital Problem list     Assessment & Plan:  Colonic ileus Abdominal distention -Resolved -Tube feeds resumed  Chronic respiratory failure -Continue same vent settings -No respiratory decompensation at present  Electrolyte derangement -Continue to replete  Severe protein calorie malnutrition Hypoalbuminemia-present on admission -Continue tube feeds  Discussed with spouse at bedside Patient's neurologist had recommended an EMG -Unfortunately this cannot be done here as inpatient at present -I did encourage the patient to contact his neurologist to see if patient can be transferred to their service as an inpatient to have further evaluation  His respiratory status appears stable at present  PCCM will continue to follow for chronic vent needs  Best Practice (right click and "Reselect all SmartList Selections" daily)   Diet/type: tubefeeds DVT prophylaxis: LMWH GI prophylaxis: PPI Lines: N/A Foley:  N/A Code Status:  full code Last date of multidisciplinary goals of care discussion [per primary]  06/28/21, MD [place] PCCM Pager: See Virl Diamond

## 2021-04-28 NOTE — Progress Notes (Signed)
Assisted tele visit to patient with wife.  Chas Axel Lee, RN   

## 2021-04-28 NOTE — Progress Notes (Signed)
Patient phrenic nerve injury  Did discuss need for EMG with spouse  Spoke with Dr. Amada Jupiter of neurology, we do not have the capability to do the EMG as inpatient  I did inform the spouse about this If his neurologist wants to take the patient in as an inpatient and then this can be facilitated  I did ask the spouse to touch base with patient's neurologist.  From a ventilator care perspective, no changes at present

## 2021-04-28 NOTE — Progress Notes (Signed)
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 CT of the abdomen shows colonic distention, colonic ileus without obstruction or perforation. He was initially admitted to [facility] service as he vent dependent.  GI consulted.  Colonic distention/ileus resolved with bowel regimen.  GI signed off.  Transferred to [facility] on 04/25/2021.  Subjective: Seen and examined earlier this morning.  No major events overnight of this morning.  No complaints.  He notes no to pain.  He follows commands.  Objective: Vitals:   04/28/21 0730 04/28/21 0751 04/28/21 0800 04/28/21 0830  BP: (!) 132/58 (!) 132/58 (!) 141/50 (!) 154/64  Pulse: 66 66 66 68  Resp: 16 16 16 16   Temp:      TempSrc:      SpO2: 97% 97% 98% 99%  Weight:      Height:        Intake/Output Summary (Last 24 hours) at 04/28/2021 1045 Last data filed at 04/28/2021 1000 Gross per 24 hour  Intake 2322.91 ml  Output 2850 ml  Net -527.09 ml   Filed Weights   04/24/21 0500 04/27/21 0500  Weight: 90.9 kg 96 kg    Examination:  GENERAL: No apparent distress.  Nontoxic. HEENT: MMM.  Vision and hearing grossly intact.  NECK: Tracheostomy. RESP: On PRVC via trach.  No IWOB.  Fair aeration bilaterally. CVS:  RRR. Heart sounds normal.  ABD/GI/GU: BS+. Abd soft, NTND.  Brown watery stool in rectal tube. MSK/EXT: Generalized weakness globally.  Trace edema globally SKIN: no apparent skin lesion or wound NEURO: Awake, alert and oriented appropriately.  No apparent focal neuro deficit. PSYCH: Calm. Normal affect.    Procedures:  None  Microbiology summarized: MRSA PCR screen positive.  Assessment & Plan: Colonic distention/ileus: has history of constipation and colonic dilation.  Likely opiate induced.  Seem to be on fentanyl patch and Dilaudid. Seems to have resolved with MoviPrep.  Abdominal exam reassuring.  Brown liquidy stool in rectal tear. -Decrease MiraLAX to once daily.  He is also on Colace twice daily as needed. -Will take rectal tube out once stools are formed. -Continue holding fentanyl patch and Dilaudid -GI signed off.  Chronic respiratory failure, tracheostomy, vent dependent -On PRVC.  PCCM managing.   Hypokalemia and hypomagnesemia: Resolved. -Continue potassium supplementation   Hypoglycemia without diagnosis of diabetes. -Decrease D10 to 20 mls per hour.   Anemia of chronic disease: H&H stable. 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  Chronic pain/neuropathy/mood disorder/insomnia -Continue home Cymbalta -On Lyrica instead of gabapentin -Resume as needed Ativan -Continue holding fentanyl patch and Dilaudid-no withdrawal symptoms.  Nutrition Body mass index is 32.18 kg/m. Nutrition Problem: Inadequate oral intake Etiology: inability to eat Signs/Symptoms: NPO status Interventions: Tube feeding  DVT prophylaxis:  enoxaparin (LOVENOX) injection 40 mg Start: 04/24/21 1500 SCDs Start: 04/24/21 0054  Code Status: Full code Family Communication: Patient and/or RN.  Updated patient's wife over the phone. Level  of care: ICU Status is: Inpatient  Remains inpatient appropriate because:Unsafe d/c plan, IV treatments appropriate due to intensity of illness or inability to take PO, and Inpatient level of care appropriate due to severity of illness  Dispo: The patient is from:  [facility]               Anticipated d/c is to:  To be determined              Patient currently is medically stable to d/c.   Difficult to place patient Yes       Consultants:  PCCM Gastroenterology   Sch Meds:  Scheduled Meds:  DULoxetine  30 mg Oral BID   enoxaparin (LOVENOX) injection  40 mg Subcutaneous Q24H   feeding supplement (VITAL 1.5 CAL)  1,000 mL Per Tube Q24H   mouth rinse  15 mL Mouth Rinse QID   melatonin  3 mg Per Tube QHS   mupirocin ointment  1 application Nasal BID   pantoprazole sodium  40 mg Per Tube Daily   polyethylene glycol  17 g Per Tube BID   potassium chloride  40 mEq Per Tube BID   pregabalin  100 mg Per Tube TID   sodium chloride flush  10-40 mL Intracatheter Q12H   Continuous Infusions:  0.9 % NaCl with KCl 20 mEq / L 30 mL/hr at 04/28/21 1000   dextrose 40 mL/hr at 04/28/21 1000   PRN Meds:.docusate, hydrOXYzine, ipratropium-albuterol, ondansetron (ZOFRAN) IV, sodium chloride flush  Antimicrobials: Anti-infectives (From admission, onward)    None        I have personally reviewed the following labs and images: CBC: Recent Labs  Lab 04/23/21 2020 04/26/21 0347 04/27/21 0716  WBC 7.1 6.4 5.9  NEUTROABS 4.6 4.1  --   HGB 10.7* 9.1* 9.6*  HCT 33.4* 27.4* 28.6*  MCV 92.5 91.3 89.9  PLT 224 179 204   BMP &GFR Recent Labs  Lab 04/24/21 2018 04/25/21 0611 04/25/21 1055 04/25/21 2125 04/26/21 0347 04/26/21 1759 04/27/21 0436 04/27/21 0716 04/28/21 0409  NA 144 142  --   --  138  --   --  137 137  K 3.4* 3.3*  --  3.4* 3.2*  --   --  3.4* 4.3  CL 117* 114*  --   --  109  --   --  110 111  CO2 23 24  --   --  24  --   --  24 24  GLUCOSE 72 98  --   --  124*  --   --  98 113*  BUN 6* <5*  --   --  <5*  --   --  <5* <5*  CREATININE <0.30* <0.30*  --   --  <0.30*  --   --  <0.30* <0.30*  CALCIUM 8.0* 8.0*  --   --  7.7*  --   --  7.7* 7.8*  MG  --   --  1.7 1.9 1.8 2.0 1.8  --  2.1  PHOS  --   --  3.0 2.8 2.8 3.1  --  3.3  --    CrCl cannot  be calculated (This lab value cannot be used to calculate CrCl because it is not a number: <0.30). Liver & Pancreas: Recent Labs  Lab 04/23/21 2020 04/27/21 0716  AST 11*  --   ALT 9  --   ALKPHOS 75  --   BILITOT 0.7  --   PROT 5.6*  --  ALBUMIN 2.2* 1.8*   Recent Labs  Lab 04/23/21 2020  LIPASE 18   No results for input(s): AMMONIA in the last 168 hours. Diabetic: No results for input(s): HGBA1C in the last 72 hours. Recent Labs  Lab 04/27/21 1507 04/27/21 1924 04/27/21 2318 04/28/21 0316 04/28/21 0748  GLUCAP 110* 95 109* 116* 120*   Cardiac Enzymes: No results for input(s): CKTOTAL, CKMB, CKMBINDEX, TROPONINI in the last 168 hours. No results for input(s): PROBNP in the last 8760 hours. Coagulation Profile: No results for input(s): INR, PROTIME in the last 168 hours. Thyroid Function Tests: No results for input(s): TSH, T4TOTAL, FREET4, T3FREE, THYROIDAB in the last 72 hours. Lipid Profile: No results for input(s): CHOL, HDL, LDLCALC, TRIG, CHOLHDL, LDLDIRECT in the last 72 hours. Anemia Panel: No results for input(s): VITAMINB12, FOLATE, FERRITIN, TIBC, IRON, RETICCTPCT in the last 72 hours. Urine analysis:    Component Value Date/Time   COLORURINE YELLOW 04/24/2021 0403   APPEARANCEUR HAZY (A) 04/24/2021 0403   LABSPEC >1.046 (H) 04/24/2021 0403   PHURINE 5.0 04/24/2021 0403   GLUCOSEU 50 (A) 04/24/2021 0403   HGBUR LARGE (A) 04/24/2021 0403   BILIRUBINUR NEGATIVE 04/24/2021 0403   KETONESUR 20 (A) 04/24/2021 0403   PROTEINUR 100 (A) 04/24/2021 0403   NITRITE NEGATIVE 04/24/2021 0403   LEUKOCYTESUR NEGATIVE 04/24/2021 0403   Sepsis Labs: Invalid input(s): PROCALCITONIN, LACTICIDVEN  Microbiology: Recent Results (from the past 240 hour(s))  MRSA Next Gen by PCR, Nasal     Status: Abnormal   Collection Time: 04/24/21  2:03 AM   Specimen: Nasal Mucosa; Nasal Swab  Result Value Ref Range Status   MRSA by PCR Next Gen DETECTED (A) NOT DETECTED Final     Comment: RESULT CALLED TO, READ BACK BY AND VERIFIED WITH: RN M TOLER AT 0408 04/24/2021 BY L BENFIELD (NOTE) The GeneXpert MRSA Assay (FDA approved for NASAL specimens only), is one component of a comprehensive MRSA colonization surveillance program. It is not intended to diagnose MRSA infection nor to guide or monitor treatment for MRSA infections. Test performance is not FDA approved in patients less than 80 years old. Performed at [facility], [address] [place], [place] [address]     Radiology Studies: No results found.    Daveda Larock T. Terryl Niziolek [facility]  If 7PM-7AM, please contact night-coverage [email or web address] 04/28/2021, 10:45 AM

## 2021-04-28 NOTE — Progress Notes (Signed)
Nutrition Follow-up  DOCUMENTATION CODES:   Not applicable  INTERVENTION:   TF via PEG: Increase Vital 1.5 to goal rate of 55 ml/h (1320 ml/d) Add Prosource TF 45 ml TID  Provides 2100 kcal, 122 gm protein, 1003 ml free water daily  Free water flushes 100 ml every 4 hours.  When IVF discontinued, increase free water flushes to 200 ml every 4 hours for a total of 2203 ml free water daily.  NUTRITION DIAGNOSIS:   Inadequate oral intake related to inability to eat as evidenced by NPO status.  Ongoing   GOAL:   Patient will meet greater than or equal to 90% of their needs  Progressing   MONITOR:   I & O's, TF tolerance  REASON FOR ASSESSMENT:   Ventilator    ASSESSMENT:   Pt admitted from [facility] with increasing abdominal distention and abdominal pain due to constipation and ileus. Pt with PMH of chronic pain, septic shock, chronic respiratory failure, recent ED visit for constipation, hx spinal surgeries, MSSA bacteriemia and pressure injury.  Discussed patient in ICU rounds and with RN today. Colonic distention/ileus resolved S/P Miralax purge 7/4. Okay to increase TF to goal rate per discussion with CCM in rounds.  Currently receiving Vital 1.5 at 20 ml/h via PEG. Will increase to goal rate of 55 ml/h today.     Patient is currently intubated on ventilator support via trach MV: 8.4 L/min Temp (24hrs), Avg:98.4 F (36.9 C), Min:98 F (36.7 C), Max:98.9 F (37.2 C)   Labs reviewed.  CBG: 116-120-115  Medications reviewed and include protonix, klor-con. IVF: D10 at 20 ml/h  Weight up to 96 kg 7/6 from 90.9 kg on admission 7/3. I/O +962 ml since admission Rectal tube output 750 ml x 24 hours UOP 2100 ml x 24 hours  NUTRITION - FOCUSED PHYSICAL EXAM:  Flowsheet Row Most Recent Value  Orbital Region Severe depletion  Upper Arm Region No depletion  Thoracic and Lumbar Region No depletion  Buccal Region Unable to assess  Temple Region Severe  depletion  Clavicle Bone Region Mild depletion  Clavicle and Acromion Bone Region No depletion  Scapular Bone Region Unable to assess  Dorsal Hand No depletion  Patellar Region No depletion  Anterior Thigh Region No depletion  Posterior Calf Region No depletion  Edema (RD Assessment) Moderate  Hair Reviewed  Eyes Unable to assess  Mouth Unable to assess  Skin Reviewed  Nails Reviewed       Diet Order:   Diet Order             Diet NPO time specified  Diet effective now                   EDUCATION NEEDS:   Not appropriate for education at this time  Skin:  Skin Assessment: Reviewed RN Assessment  Last BM:  7/7 rectal tube  Height:   Ht Readings from Last 1 Encounters:  04/23/21 5\' 8"  (1.727 m)    Weight:   Wt Readings from Last 1 Encounters:  04/27/21 96 kg    BMI:  Body mass index is 32.18 kg/m.  Estimated Nutritional Needs:   Kcal:  1900-2100  Protein:  100-125 grams  Fluid:  >1.9 L/day    06/28/21, RD, LDN, CNSC Please refer to Amion for contact information.

## 2021-04-29 ENCOUNTER — Inpatient Hospital Stay (HOSPITAL_COMMUNITY): Payer: Medicare HMO

## 2021-04-29 DIAGNOSIS — Z930 Tracheostomy status: Secondary | ICD-10-CM

## 2021-04-29 DIAGNOSIS — R140 Abdominal distension (gaseous): Secondary | ICD-10-CM | POA: Diagnosis not present

## 2021-04-29 DIAGNOSIS — G7281 Critical illness myopathy: Secondary | ICD-10-CM | POA: Diagnosis not present

## 2021-04-29 DIAGNOSIS — J9611 Chronic respiratory failure with hypoxia: Secondary | ICD-10-CM | POA: Diagnosis not present

## 2021-04-29 DIAGNOSIS — K567 Ileus, unspecified: Secondary | ICD-10-CM | POA: Diagnosis not present

## 2021-04-29 DIAGNOSIS — Z9911 Dependence on respirator [ventilator] status: Secondary | ICD-10-CM

## 2021-04-29 DIAGNOSIS — K5903 Drug induced constipation: Secondary | ICD-10-CM | POA: Diagnosis not present

## 2021-04-29 DIAGNOSIS — R509 Fever, unspecified: Secondary | ICD-10-CM

## 2021-04-29 LAB — CBC
HCT: 25.1 % — ABNORMAL LOW (ref 39.0–52.0)
Hemoglobin: 8 g/dL — ABNORMAL LOW (ref 13.0–17.0)
MCH: 29.7 pg (ref 26.0–34.0)
MCHC: 31.9 g/dL (ref 30.0–36.0)
MCV: 93.3 fL (ref 80.0–100.0)
Platelets: 181 10*3/uL (ref 150–400)
RBC: 2.69 MIL/uL — ABNORMAL LOW (ref 4.22–5.81)
RDW: 14.6 % (ref 11.5–15.5)
WBC: 5.4 10*3/uL (ref 4.0–10.5)
nRBC: 0 % (ref 0.0–0.2)

## 2021-04-29 LAB — MAGNESIUM: Magnesium: 1.9 mg/dL (ref 1.7–2.4)

## 2021-04-29 LAB — RENAL FUNCTION PANEL
Albumin: 1.6 g/dL — ABNORMAL LOW (ref 3.5–5.0)
Anion gap: 2 — ABNORMAL LOW (ref 5–15)
BUN: <5 mg/dL — ABNORMAL LOW (ref 8–23)
CO2: 24 mmol/L (ref 22–32)
Calcium: 7.6 mg/dL — ABNORMAL LOW (ref 8.9–10.3)
Chloride: 112 mmol/L — ABNORMAL HIGH (ref 98–111)
Creatinine, Ser: <0.3 mg/dL — ABNORMAL LOW (ref 0.61–1.24)
Glucose, Bld: 133 mg/dL — ABNORMAL HIGH (ref 70–99)
Phosphorus: 3.8 mg/dL (ref 2.5–4.6)
Potassium: 4.6 mmol/L (ref 3.5–5.1)
Sodium: 138 mmol/L (ref 135–145)

## 2021-04-29 LAB — GLUCOSE, CAPILLARY
Glucose-Capillary: 133 mg/dL — ABNORMAL HIGH (ref 70–99)
Glucose-Capillary: 128 mg/dL — ABNORMAL HIGH (ref 70–99)
Glucose-Capillary: 119 mg/dL — ABNORMAL HIGH (ref 70–99)
Glucose-Capillary: 129 mg/dL — ABNORMAL HIGH (ref 70–99)
Glucose-Capillary: 94 mg/dL (ref 70–99)
Glucose-Capillary: 105 mg/dL — ABNORMAL HIGH (ref 70–99)

## 2021-04-29 LAB — URINALYSIS, ROUTINE W REFLEX MICROSCOPIC
Bilirubin Urine: NEGATIVE
Glucose, UA: NEGATIVE mg/dL
Hgb urine dipstick: NEGATIVE
Ketones, ur: NEGATIVE mg/dL
Leukocytes,Ua: NEGATIVE
Nitrite: NEGATIVE
Protein, ur: NEGATIVE mg/dL
Specific Gravity, Urine: 1.006 (ref 1.005–1.030)
pH: 6 (ref 5.0–8.0)

## 2021-04-29 LAB — HEMOGLOBIN AND HEMATOCRIT, BLOOD
HCT: 24.7 % — ABNORMAL LOW (ref 39.0–52.0)
Hemoglobin: 8 g/dL — ABNORMAL LOW (ref 13.0–17.0)

## 2021-04-29 LAB — LACTIC ACID, PLASMA: Lactic Acid, Venous: 1.1 mmol/L (ref 0.5–1.9)

## 2021-04-29 MED ORDER — LACTATED RINGERS IV BOLUS
1000.0000 mL | Freq: Once | INTRAVENOUS | Status: AC
  Administered 2021-04-29: 1000 mL via INTRAVENOUS

## 2021-04-29 MED ORDER — ACETAMINOPHEN 160 MG/5ML PO SOLN
650.0000 mg | ORAL | Status: DC | PRN
  Administered 2021-04-29 – 2021-04-30 (×3): 650 mg
  Filled 2021-04-29 (×3): qty 20.3

## 2021-04-29 MED ORDER — POLYETHYLENE GLYCOL 3350 17 G PO PACK: 17.0000 g | PACK | Freq: Two times a day (BID) | ORAL | Status: DC | PRN

## 2021-04-29 MED ORDER — SODIUM CHLORIDE 0.9 % IV SOLN
1.0000 g | Freq: Three times a day (TID) | INTRAVENOUS | Status: DC
  Administered 2021-04-29 – 2021-05-01 (×7): 1 g via INTRAVENOUS
  Filled 2021-04-29 (×11): qty 1

## 2021-04-29 MED ORDER — VANCOMYCIN HCL 2000 MG/400ML IV SOLN
2000.0000 mg | Freq: Once | INTRAVENOUS | Status: AC
  Administered 2021-04-29: 2000 mg via INTRAVENOUS
  Filled 2021-04-29: qty 400

## 2021-04-29 MED ORDER — POTASSIUM CHLORIDE 20 MEQ PO PACK
40.0000 meq | PACK | Freq: Every day | ORAL | Status: DC
  Administered 2021-04-30 – 2021-05-10 (×11): 40 meq
  Filled 2021-04-29 (×11): qty 2

## 2021-04-29 MED ORDER — BISACODYL 5 MG PO TBEC: 10.0000 mg | DELAYED_RELEASE_TABLET | Freq: Every day | ORAL | Status: DC | PRN

## 2021-04-29 MED ORDER — HYDROMORPHONE HCL 2 MG PO TABS
2.0000 mg | ORAL_TABLET | ORAL | Status: DC | PRN
  Administered 2021-04-29 – 2021-05-25 (×53): 2 mg via ORAL
  Filled 2021-04-29 (×55): qty 1

## 2021-04-29 MED ORDER — DOCUSATE SODIUM 50 MG/5ML PO LIQD
100.0000 mg | Freq: Two times a day (BID) | ORAL | Status: DC
  Administered 2021-04-30: 100 mg
  Filled 2021-04-29 (×2): qty 10

## 2021-04-29 MED ORDER — VANCOMYCIN HCL 1500 MG/300ML IV SOLN
1500.0000 mg | INTRAVENOUS | Status: DC
  Administered 2021-04-30 – 2021-05-01 (×2): 1500 mg via INTRAVENOUS
  Filled 2021-04-29 (×3): qty 300

## 2021-04-29 NOTE — Progress Notes (Signed)
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 CT of the abdomen shows colonic distention, colonic ileus without obstruction or perforation. He was initially admitted to [facility] service as he vent dependent.  GI consulted.  Colonic distention/ileus resolved with bowel regimen.  GI signed off.  Transferred to [facility] on 04/25/2021.  Hypotensive to 94/43 and febrile to 101.7 the morning of 7/8.  Pancultured.  Started on meropenem and vancomycin.   Subjective: Seen and examined earlier this morning.  Spiked fever to 101.7 overnight.  Was slightly hypotensive as well.  Received IV fluid bolus.  UA negative.  CXR with small pleural effusion.  Pancultured.  Started on meropenem and vancomycin.  No complaint this morning.  Objective: Vitals:   04/29/21 0700 04/29/21 0800 04/29/21 0900 04/29/21 1000  BP: (!) 124/46 (!) 125/48 (!) 133/47   Pulse: 70 62 67 68  Resp: 16 16 16 16   Temp: 99 F (37.2 C)     TempSrc: Axillary     SpO2: 97% 98% 97% 98%  Weight:      Height:        Intake/Output Summary (Last 24 hours) at 04/29/2021 1016 Last data filed at 04/29/2021 1000 Gross per 24 hour  Intake 2639.42 ml  Output 3700 ml  Net -1060.58 ml   Filed Weights   04/24/21 0500 04/27/21 0500 04/29/21 0433  Weight: 90.9 kg 96 kg 96 kg    Examination:  GENERAL: No apparent distress.  Nontoxic. HEENT: MMM.  Vision and hearing grossly intact.  NECK: Tracheostomy in place. RESP: On PRVC via trach.  No IWOB.  Fair aeration bilaterally. CVS:  RRR. Heart sounds normal.  ABD/GI/GU:  BS+. Abd soft, NTND.  MSK/EXT: Generalized weakness globally.  Trace edema globally. SKIN: no apparent skin lesion or wound NEURO: Awake.  Seems to be fairly oriented.  No apparent focal neuro deficit other than generalized weakness globally PSYCH: Calm. Normal affect.  Procedures:  None  Microbiology summarized: MRSA PCR screen positive.  Assessment & Plan: Colonic distention/ileus: has history of constipation and colonic dilation.  Likely opiate induced.  Seem to be on fentanyl patch and Dilaudid. Seems to have resolved with MoviPrep.  Abdominal exam reassuring.  Brown liquidy stool in rectal tube. -Scheduled Colace twice daily.  Change MiraLAX to as needed -Will take rectal tube out once stools are formed. -Continue holding Dilaudid.  Fentanyl patch resumed on 7/7. -GI signed off.  Fever: hypotensive to 94/43 and febrile to 101.7 overnight.  Unclear source of fever.  Has no leukocytosis.  CXR and UA reassuring. -Continue meropenem and vancomycin pending cultures -Follow cultures.  Chronic respiratory failure, tracheostomy, vent dependent -On PRVC.  PCCM managing.   Hypokalemia and hypomagnesemia: Resolved.  K4.6. -Decreased K-Dur to 40 mill equivalent daily   Hypoglycemia without diagnosis of diabetes. Hypoalbuminemia -Discontinue dextrose -Continue to feed   Anemia of chronic disease: Hgb dropped about 2 g.  No obvious signs of bleeding.  He received LR bolus overnight.  Recent Labs    12/24/20 2125 04/23/21 2020 04/26/21 0347 04/27/21 0716 04/29/21 0240 04/29/21 0800  HGB 12.7* 10.7* 9.1* 9.6* 8.0* 8.0*  -Check anemia panel in the morning -Check Hemoccult if further drop.  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  Chronic pain/neuropathy/mood disorder/insomnia -Continue home Cymbalta -On Lyrica instead of gabapentin -Resumed as needed Ativan on 7/7 -Fentanyl patch resumed by PCCM on 7/7. -Continue holding  Dilaudid.  Nutrition Body mass index is 32.18 kg/m. Nutrition Problem: Inadequate oral intake Etiology: inability to eat Signs/Symptoms: NPO status Interventions: Tube feeding  DVT prophylaxis:  enoxaparin (LOVENOX) injection 40 mg Start: 04/24/21 1500 SCDs Start: 04/24/21 0054  Code Status: Full code Family Communication: Patient and/or RN.  Updated patient's wife over the phone on 7/7. Level of care: ICU Status is: Inpatient  Remains inpatient appropriate because:Unsafe d/c plan, IV treatments appropriate due to intensity of illness or inability to take PO, and Inpatient level of care appropriate due to severity of illness  Dispo: The patient is from:  [facility]              Anticipated d/c is to:  To be determined              Patient currently is medically stable to d/c.   Difficult to place patient Yes       Consultants:  PCCM Gastroenterology   Sch Meds:  Scheduled Meds:  doxazosin  2 mg Per Tube Daily   DULoxetine  30 mg Oral BID   enoxaparin (LOVENOX) injection  40 mg Subcutaneous Q24H   feeding supplement (PROSource TF)  45 mL Per Tube TID   fentaNYL  1 patch Transdermal Q72H   free water  100 mL Per Tube Q4H   mouth rinse  15 mL Mouth Rinse QID   melatonin  3 mg Per Tube QHS   pantoprazole sodium  40 mg Per Tube Daily   polyethylene glycol  17 g Per Tube Daily   potassium chloride  40 mEq Per Tube BID   pregabalin  100 mg Per Tube TID   sodium chloride flush  10-40 mL Intracatheter Q12H   Continuous Infusions:  sodium chloride 10 mL/hr at 04/29/21 0004   dextrose Stopped (04/29/21 0843)   feeding supplement (VITAL 1.5 CAL) 1,000 mL (04/28/21 1612)   meropenem (MERREM) IV Stopped (04/29/21 0403)   [START ON 04/30/2021] vancomycin     PRN Meds:.acetaminophen (TYLENOL) oral liquid 160 mg/5 mL, docusate, hydrOXYzine, ipratropium-albuterol, LORazepam, ondansetron (ZOFRAN) IV, sodium chloride flush  Antimicrobials: Anti-infectives (From admission, onward)     Start     Dose/Rate Route Frequency Ordered Stop   04/30/21 0330  vancomycin (VANCOREADY) IVPB 1500 mg/300 mL        1,500 mg 150 mL/hr over 120 Minutes Intravenous Every 24 hours 04/29/21 0237     04/29/21 0330  vancomycin (VANCOREADY) IVPB 2000 mg/400 mL        2,000 mg 200 mL/hr over 120 Minutes Intravenous  Once 04/29/21 0237 04/29/21 0526   04/29/21 0330  meropenem (MERREM) 1 g in sodium chloride 0.9 % 100 mL IVPB        1 g 200 mL/hr over 30 Minutes Intravenous Every 8 hours 04/29/21 0237          I have personally reviewed the following labs and images: CBC: Recent Labs  Lab 04/23/21 2020 04/26/21 0347 04/27/21 0716 04/29/21 0240 04/29/21 0800  WBC 7.1 6.4 5.9 5.4  --   NEUTROABS 4.6 4.1  --   --   --   HGB 10.7* 9.1* 9.6* 8.0* 8.0*  HCT  33.4* 27.4* 28.6* 25.1* 24.7*  MCV 92.5 91.3 89.9 93.3  --   PLT 224 179 204 181  --    BMP &GFR Recent Labs  Lab 04/25/21 0611 04/25/21 1055 04/25/21 2125 04/26/21 0347 04/26/21 1759 04/27/21 0436 04/27/21 0716 04/28/21 0409 04/29/21 0240  NA 142  --   --  138  --   --  137 137 138  K 3.3*  --  3.4* 3.2*  --   --  3.4* 4.3 4.6  CL 114*  --   --  109  --   --  110 111 112*  CO2 24  --   --  24  --   --  24 24 24   GLUCOSE 98  --   --  124*  --   --  98 113* 133*  BUN <5*  --   --  <5*  --   --  <5* <5* <5*  CREATININE <0.30*  --   --  <0.30*  --   --  <0.30* <0.30* <0.30*  CALCIUM 8.0*  --   --  7.7*  --   --  7.7* 7.8* 7.6*  MG  --    < > 1.9 1.8 2.0 1.8  --  2.1 1.9  PHOS  --    < > 2.8 2.8 3.1  --  3.3  --  3.8   < > = values in this interval not displayed.   CrCl cannot be calculated (This lab value cannot be used to calculate CrCl because it is not a number: <0.30). Liver & Pancreas: Recent Labs  Lab 04/23/21 2020 04/27/21 0716 04/29/21 0240  AST 11*  --   --   ALT 9  --   --   ALKPHOS 75  --   --   BILITOT 0.7  --   --   PROT 5.6*  --   --   ALBUMIN 2.2* 1.8* 1.6*   Recent Labs  Lab 04/23/21 2020   LIPASE 18   No results for input(s): AMMONIA in the last 168 hours. Diabetic: No results for input(s): HGBA1C in the last 72 hours. Recent Labs  Lab 04/28/21 1529 04/28/21 1922 04/28/21 2325 04/29/21 0338 04/29/21 0740  GLUCAP 111* 118* 113* 133* 128*   Cardiac Enzymes: No results for input(s): CKTOTAL, CKMB, CKMBINDEX, TROPONINI in the last 168 hours. No results for input(s): PROBNP in the last 8760 hours. Coagulation Profile: No results for input(s): INR, PROTIME in the last 168 hours. Thyroid Function Tests: No results for input(s): TSH, T4TOTAL, FREET4, T3FREE, THYROIDAB in the last 72 hours. Lipid Profile: No results for input(s): CHOL, HDL, LDLCALC, TRIG, CHOLHDL, LDLDIRECT in the last 72 hours. Anemia Panel: No results for input(s): VITAMINB12, FOLATE, FERRITIN, TIBC, IRON, RETICCTPCT in the last 72 hours. Urine analysis:    Component Value Date/Time   COLORURINE YELLOW 04/29/2021 0400   APPEARANCEUR HAZY (A) 04/29/2021 0400   LABSPEC 1.006 04/29/2021 0400   PHURINE 6.0 04/29/2021 0400   GLUCOSEU NEGATIVE 04/29/2021 0400   HGBUR NEGATIVE 04/29/2021 0400   BILIRUBINUR NEGATIVE 04/29/2021 0400   KETONESUR NEGATIVE 04/29/2021 0400   PROTEINUR NEGATIVE 04/29/2021 0400   NITRITE NEGATIVE 04/29/2021 0400   LEUKOCYTESUR NEGATIVE 04/29/2021 0400   Sepsis Labs: Invalid input(s): PROCALCITONIN, LACTICIDVEN  Microbiology: Recent Results (from the past 240 hour(s))  MRSA Next Gen by PCR, Nasal     Status: Abnormal   Collection Time: 04/24/21  2:03 AM   Specimen: Nasal Mucosa; Nasal Swab  Result Value Ref Range Status   MRSA by PCR Next Gen DETECTED (A) NOT DETECTED Final    Comment: RESULT CALLED TO, READ BACK BY AND VERIFIED WITH: RN Mirian CapuchinM TOLER AT 0408 04/24/2021 BY L BENFIELD (NOTE) The GeneXpert MRSA Assay (FDA approved for NASAL specimens only), is one component of a comprehensive MRSA colonization surveillance program. It is not intended to diagnose MRSA infection  nor to guide or monitor treatment for MRSA infections. Test performance is not FDA approved in patients less than 69 years old. Performed at [facility], [address] [place], [place] [address]     Radiology Studies: DG CHEST PORT 1 VIEW  Result Date: 04/29/2021 CLINICAL DATA:  Sepsis EXAM: PORTABLE CHEST 1 VIEW COMPARISON:  04/23/2021 FINDINGS: Small layering right pleural effusion. Trace left pleural effusion. No frank interstitial edema. Heart is normal in size. Tracheostomy in satisfactory position. Right arm PICC terminates cavoatrial junction. IMPRESSION: Small layering right pleural effusion.  Trace left pleural effusion. Support apparatus as above. Electronically Signed   By: Charline BillsSriyesh  Krishnan M.D.   On: 04/29/2021 02:45      Dondi Aime T. Yaris Ferrell [facility]  If 7PM-7AM, please contact night-coverage [email or web address] 04/29/2021, 10:16 AM

## 2021-04-29 NOTE — Progress Notes (Signed)
NAME:  Andrew Rivera, MRN:  [id number], DOB:  [birth year], LOS: 5 ADMISSION DATE:  04/23/2021, CONSULTATION DATE:  04/24/21 REFERRING MD:  ED, CHIEF COMPLAINT:  abdominal pain   History of Present Illness:  70 yo male former smoker with hx of PNA and critical illness myopathy presented with abdominal distention and pain.  He has chronic trach and vent dependence.  Pertinent  Medical History  Chronic pain, IJ thromboembolism, Septic shock, Pneumonia, Constipation with ileus, Critical illness myopathy, MSSA Bacteremia in November 2020, Pressure wounds  Significant Hospital Events: Including procedures, antibiotic start and stop dates in addition to other pertinent events   7/02 Admit 7/08 fever 101.7, start vancomycin/cefepime  Interim History / Subjective:  Fever overnight.  Abx added.  Objective   Blood pressure (!) 124/46, pulse 62, temperature 99.5 F (37.5 C), temperature source Oral, resp. rate 16, height 5\' 8"  (1.727 m), weight 96 kg, SpO2 98 %.    Vent Mode: PRVC FiO2 (%):  [28 %] 28 % Set Rate:  [16 bmp] 16 bmp Vt Set:  [550 mL] 550 mL PEEP:  [5 cmH20] 5 cmH20 Plateau Pressure:  [15 cmH20-20 cmH20] 17 cmH20   Intake/Output Summary (Last 24 hours) at 04/29/2021 0819 Last data filed at 04/29/2021 0700 Gross per 24 hour  Intake 2822.99 ml  Output 3700 ml  Net -877.01 ml   Filed Weights   04/24/21 0500 04/27/21 0500 04/29/21 0433  Weight: 90.9 kg 96 kg 96 kg    Examination:  General - alert Eyes - pupils reactive ENT - trach site clean Cardiac - regular rate/rhythm, no murmur Chest - equal breath sounds b/l, no wheezing or rales Abdomen - soft, non tender, + bowel sounds Extremities - decreased muscle bulk Skin - no rashes Neuro - follows commands Psych - normal mood and behavior  Resolved Hospital Problem list     Assessment & Plan:   Chronic hypoxic respiratory failure in setting of critical illness myopathy. Tracheostomy and ventilator dependence. - goal  SpO2 > 92% - f/u CXR intermittently - trach care - pressure support as tolerated - outpt neurologist recommended EMG >> would need transfer to another facility if this is to be done  Fever noted 04/29/21. - f/u culture results - day 1 of ABx  Colonic ileus. Hypokalemia. DM type 2. Anemia of critical illness and chronic disease. Mood disorder with insomnia. Chronic pain. - per primary team  Labs:   CMP Latest Ref Rng & Units 04/29/2021 04/28/2021 04/27/2021  Glucose 70 - 99 mg/dL 06/28/2021) 628(B) 98  BUN 8 - 23 mg/dL 151(V) <6(H) <6(W)  Creatinine 0.61 - 1.24 mg/dL <7(P) <7.10(G) <2.69(S)  Sodium 135 - 145 mmol/L 138 137 137  Potassium 3.5 - 5.1 mmol/L 4.6 4.3 3.4(L)  Chloride 98 - 111 mmol/L 112(H) 111 110  CO2 22 - 32 mmol/L 24 24 24   Calcium 8.9 - 10.3 mg/dL 7.6(L) 7.8(L) 7.7(L)  Total Protein 6.5 - 8.1 g/dL - - -  Total Bilirubin 0.3 - 1.2 mg/dL - - -  Alkaline Phos 38 - 126 U/L - - -  AST 15 - 41 U/L - - -  ALT 0 - 44 U/L - - -    CBC Latest Ref Rng & Units 04/29/2021 04/29/2021 04/27/2021  WBC 4.0 - 10.5 K/uL - 5.4 5.9  Hemoglobin 13.0 - 17.0 g/dL 8.0(L) 8.0(L) 9.6(L)  Hematocrit 39.0 - 52.0 % 24.7(L) 25.1(L) 28.6(L)  Platelets 150 - 400 K/uL - 181 204   CBG (last 3)  Recent Labs    04/28/21 2325 04/29/21 0338 04/29/21 0740  GLUCAP 113* 133* 128*   Signature:  Coralyn Helling, MD [facility] Pulmonary/Critical Care Pager - [phone] 04/29/2021, 8:28 AM

## 2021-04-29 NOTE — Progress Notes (Signed)
Unable to obtain sputum for culture, RN aware, leaving sputum trap in place.

## 2021-04-29 NOTE — Progress Notes (Signed)
Pharmacy Antibiotic Note  Andrew Rivera is a 70 y.o. male admitted on 04/23/2021. Pharmacy has been consulted for vancomycin and meropenem dosing for sepsis. History of ESBL. Tmax 101.7. WBC wnl on 7/6.   Plan: Vancomycin 2000mg  x1 loading dose followed by 1500 q24h (eAUC 420 using Scr 0.8 and Vd 0.5 for BMI 32) Meropenem 1g IV q8h  Monitor renal function, cultures, and clinical progression  Height: 5\' 8"  (172.7 cm) Weight: 96 kg (211 lb 10.3 oz) IBW/kg (Calculated) : 68.4  Temp (24hrs), Avg:99.2 F (37.3 C), Min:98 F (36.7 C), Max:101.7 F (38.7 C)  Recent Labs  Lab 04/23/21 2020 04/24/21 0953 04/24/21 2018 04/25/21 0611 04/26/21 0347 04/27/21 0716 04/28/21 0409  WBC 7.1  --   --   --  6.4 5.9  --   CREATININE <0.30*   < > <0.30* <0.30* <0.30* <0.30* <0.30*   < > = values in this interval not displayed.    CrCl cannot be calculated (This lab value cannot be used to calculate CrCl because it is not a number: <0.30).    Allergies  Allergen Reactions   Chlorhexidine Itching and Rash   Codeine Nausea Only   Oxycodone-Acetaminophen Nausea Only    Other reaction(s): Hallucinations    Antimicrobials this admission: Meropenem 7/8 >>  Vancomycin 7/8 >>  Dose adjustments this admission:   Microbiology results: 7/8 TA:  7/8 Bcx:   Thank you for allowing pharmacy to be a part of this patient's care.  06/29/21, PharmD Clinical Pharmacist  04/29/2021 2:29 AM

## 2021-04-29 NOTE — Progress Notes (Signed)
eLink Physician-Brief Progress Note Patient Name: Andrew Rivera DOB: 10-08-1951 MRN: [id number]   Date of Service  04/29/2021  HPI/Events of Note  BP 94/43 (had been SBP 120-140 mmHg). Fever to 38.7 C.  Patient is chronically ventilator dependent with tracheostomy. Here with ileus.   eICU Interventions  1L LR bolus. Lactic acid. Blood cultures x2 Tracheal aspirate culture Urinalysis with reflex CXR  Given his history of MDR organisms, will start vanc/meropenem pending further data.     Intervention Category Major Interventions: Sepsis - evaluation and management  Marveen Reeks Mayia Megill 04/29/2021, 2:25 AM

## 2021-04-30 DIAGNOSIS — R140 Abdominal distension (gaseous): Secondary | ICD-10-CM | POA: Diagnosis not present

## 2021-04-30 DIAGNOSIS — K567 Ileus, unspecified: Secondary | ICD-10-CM | POA: Diagnosis not present

## 2021-04-30 DIAGNOSIS — Z9911 Dependence on respirator [ventilator] status: Secondary | ICD-10-CM | POA: Diagnosis not present

## 2021-04-30 DIAGNOSIS — K5903 Drug induced constipation: Secondary | ICD-10-CM | POA: Diagnosis not present

## 2021-04-30 DIAGNOSIS — D649 Anemia, unspecified: Secondary | ICD-10-CM

## 2021-04-30 LAB — IRON AND TIBC
Iron: 31 ug/dL — ABNORMAL LOW (ref 45–182)
Saturation Ratios: 21 % (ref 17.9–39.5)
TIBC: 148 ug/dL — ABNORMAL LOW (ref 250–450)
UIBC: 117 ug/dL

## 2021-04-30 LAB — CBC
HCT: 23.7 % — ABNORMAL LOW (ref 39.0–52.0)
Hemoglobin: 7.5 g/dL — ABNORMAL LOW (ref 13.0–17.0)
MCH: 29.5 pg (ref 26.0–34.0)
MCHC: 31.6 g/dL (ref 30.0–36.0)
MCV: 93.3 fL (ref 80.0–100.0)
Platelets: 169 10*3/uL (ref 150–400)
RBC: 2.54 MIL/uL — ABNORMAL LOW (ref 4.22–5.81)
RDW: 14.6 % (ref 11.5–15.5)
WBC: 5 10*3/uL (ref 4.0–10.5)
nRBC: 0 % (ref 0.0–0.2)

## 2021-04-30 LAB — RENAL FUNCTION PANEL
Albumin: 1.6 g/dL — ABNORMAL LOW (ref 3.5–5.0)
Anion gap: 4 — ABNORMAL LOW (ref 5–15)
BUN: 10 mg/dL (ref 8–23)
CO2: 26 mmol/L (ref 22–32)
Calcium: 7.6 mg/dL — ABNORMAL LOW (ref 8.9–10.3)
Chloride: 107 mmol/L (ref 98–111)
Creatinine, Ser: <0.3 mg/dL — ABNORMAL LOW (ref 0.61–1.24)
Glucose, Bld: 102 mg/dL — ABNORMAL HIGH (ref 70–99)
Phosphorus: 3.2 mg/dL (ref 2.5–4.6)
Potassium: 4.3 mmol/L (ref 3.5–5.1)
Sodium: 137 mmol/L (ref 135–145)

## 2021-04-30 LAB — RETICULOCYTES
Immature Retic Fract: 7.4 % (ref 2.3–15.9)
RBC.: 2.62 MIL/uL — ABNORMAL LOW (ref 4.22–5.81)
Retic Count, Absolute: 36.9 10*3/uL (ref 19.0–186.0)
Retic Ct Pct: 1.4 % (ref 0.4–3.1)

## 2021-04-30 LAB — GLUCOSE, CAPILLARY
Glucose-Capillary: 95 mg/dL (ref 70–99)
Glucose-Capillary: 101 mg/dL — ABNORMAL HIGH (ref 70–99)
Glucose-Capillary: 114 mg/dL — ABNORMAL HIGH (ref 70–99)
Glucose-Capillary: 107 mg/dL — ABNORMAL HIGH (ref 70–99)
Glucose-Capillary: 108 mg/dL — ABNORMAL HIGH (ref 70–99)
Glucose-Capillary: 112 mg/dL — ABNORMAL HIGH (ref 70–99)

## 2021-04-30 LAB — FOLATE: Folate: 5.9 ng/mL — ABNORMAL LOW (ref >5.9)

## 2021-04-30 LAB — CK: Total CK: 29 U/L — ABNORMAL LOW (ref 49–397)

## 2021-04-30 LAB — FERRITIN: Ferritin: 59 ng/mL (ref 24–336)

## 2021-04-30 LAB — MAGNESIUM: Magnesium: 1.9 mg/dL (ref 1.7–2.4)

## 2021-04-30 LAB — VITAMIN B12: Vitamin B-12: 1273 pg/mL — ABNORMAL HIGH (ref 180–914)

## 2021-04-30 LAB — OCCULT BLOOD X 1 CARD TO LAB, STOOL: Fecal Occult Bld: NEGATIVE

## 2021-04-30 MED ORDER — SENNOSIDES 8.8 MG/5ML PO SYRP
10.0000 mL | ORAL_SOLUTION | Freq: Every day | ORAL | Status: DC
  Administered 2021-04-30 – 2021-05-01 (×2): 10 mL
  Filled 2021-04-30 (×2): qty 10

## 2021-04-30 MED ORDER — ENOXAPARIN SODIUM 40 MG/0.4ML IJ SOSY
40.0000 mg | PREFILLED_SYRINGE | INTRAMUSCULAR | Status: DC
  Administered 2021-04-30 – 2021-06-09 (×41): 40 mg via SUBCUTANEOUS
  Filled 2021-04-30 (×37): qty 0.4

## 2021-04-30 MED ORDER — SENNOSIDES-DOCUSATE SODIUM 8.6-50 MG PO TABS: 1.0000 | ORAL_TABLET | Freq: Every day | ORAL | Status: DC

## 2021-04-30 NOTE — Progress Notes (Signed)
Elink assisted wife with tele-visit

## 2021-04-30 NOTE — Progress Notes (Signed)
PROGRESS NOTE  Andrew Rivera ZOX:[id number] DOB: [birth year]   PCP: Larena GlassmanFirozvi, Amir, MD  Patient is from: [facility]  DOA: 04/23/2021 LOS: 6  Chief complaints:  Chief Complaint  Patient presents with   Constipation     Brief Narrative / Interim history: 70 yo man with hx of chronic trach on chronic vent from [facility], hx of constipation, and colonic dilation, Hx of PNA, critical illness myopathy, here with increasing abdominal distention and abdominal pain. CT of the abdomen shows colonic distention, colonic ileus without obstruction or perforation. [facility]  GI consulted.  Colonic distention/ileus resolved with bowel regimen.  GI signed off.  Transferred to [facility] service on 04/25/2021.  Hypotensive to 94/43 and febrile to 101.7 the morning of 7/8.  Pancultured.  Started on meropenem and vancomycin.   Subjective: Seen and examined earlier this morning.  No major events overnight of this morning.  Complains low back pain which is chronic for him.  No other complaints.  Objective: Vitals:   04/30/21 0801 04/30/21 0825 04/30/21 0900 04/30/21 0902  BP:   135/61 (!) 145/52  Pulse:   72   Resp:   17   Temp: 98.5 F (36.9 C)     TempSrc: Oral     SpO2:  97%    Weight:      Height:        Intake/Output Summary (Last 24 hours) at 04/30/2021 1106 Last data filed at 04/30/2021 1000 Gross per 24 hour  Intake 2895.14 ml  Output 2271 ml  Net 624.14 ml   Filed Weights   04/27/21 0500 04/29/21 0433 04/30/21 0500  Weight: 96 kg 96 kg 97 kg    Examination:  GENERAL: No apparent distress.  Nontoxic. HEENT: MMM.  Vision and hearing grossly intact.  NECK: Tracheostomy. RESP: On vent via trach.  No IWOB.  Fair aeration bilaterally. CVS:  RRR. Heart sounds normal.  ABD/GI/GU: BS+. Abd soft, NTND.  Brown liquidy stool.  No signs of bleeding. MSK/EXT: Generalized weakness globally.  Trace edema globally. SKIN: no apparent skin  lesion or wound NEURO: Awake and alert.  Seems to be fairly oriented.  Globally weak. PSYCH: Calm. Normal affect.   Procedures:  None  Microbiology summarized: MRSA PCR screen positive.  Assessment & Plan: Colonic distention/ileus: has history of constipation and colonic dilation.  Likely opiate induced.  Seem to be on fentanyl patch and Dilaudid. Seems to have resolved with MoviPrep.  Abdominal exam reassuring.  Brown liquidy stool in rectal tube. -Scheduled Senokot-S once daily -MiraLAX twice daily as needed -Will take rectal tube out once stools are formed. -GI signed off.  Fever: Isolated fever to 101.7 the night of 7/7-7/8.  Unclear source of fever.  Has no leukocytosis.  CXR and UA reassuring.  Blood cultures NGTD.  Respiratory culture pending.  Low suspicion for C. difficile. -Continue meropenem and vancomycin pending respiratory cultures -Follow cultures.  Chronic respiratory failure, tracheostomy, vent dependent -On mechanical vent per PCCM   Hypokalemia and hypomagnesemia: Resolved.  K 4.3. -Continue K-Dur 40 mEq daily   Hypoglycemia without diagnosis of diabetes.  Resolved. Hypoalbuminemia: Albumin 1.6. -Continue tube feed.   Anemia of chronic disease: Hgb dropped about 2 g.  No melena or hematochezia.  Hemoccult negative.  Anemia panel consistent with anemia of chronic disease.  Likely dilutional and anemia of critical illness. Recent Labs    12/24/20 2125 04/23/21 2020 04/26/21 0347 04/27/21 0716 04/29/21 0240 04/29/21 0800  04/30/21 0409  HGB 12.7* 10.7* 9.1* 9.6* 8.0* 8.0* 7.5*  -Transfuse for Hgb less than 7.0.  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  Chronic lower back pain/neuropathy/mood disorder/insomnia -Continue home Cymbalta and Ativan -On Lyrica instead of gabapentin -Continue home p.o. Dilaudid and fentanyl patch with bowel regimen.  Nutrition Body mass index is 32.52  kg/m. Nutrition Problem: Inadequate oral intake Etiology: inability to eat Signs/Symptoms: NPO status Interventions: Tube feeding  DVT prophylaxis:  enoxaparin (LOVENOX) injection 40 mg Start: 04/30/21 1200 Place and maintain sequential compression device Start: 04/30/21 0733 SCDs Start: 04/24/21 0054  Code Status: Full code Family Communication: Patient and/or RN.  Updated patient's wife over the phone  Level of care: ICU Status is: Inpatient  Remains inpatient appropriate because:Unsafe d/c plan, IV treatments appropriate due to intensity of illness or inability to take PO, and Inpatient level of care appropriate due to severity of illness  Dispo: The patient is from:  [facility]              Anticipated d/c is to:  To be determined              Patient currently is medically stable to d/c.   Difficult to place patient Yes       Consultants:  PCCM Gastroenterology-signed off   Sch Meds:  Scheduled Meds:  doxazosin  2 mg Per Tube Daily   DULoxetine  30 mg Oral BID   enoxaparin (LOVENOX) injection  40 mg Subcutaneous Q24H   feeding supplement (PROSource TF)  45 mL Per Tube TID   fentaNYL  1 patch Transdermal Q72H   free water  100 mL Per Tube Q4H   mouth rinse  15 mL Mouth Rinse QID   melatonin  3 mg Per Tube QHS   pantoprazole sodium  40 mg Per Tube Daily   potassium chloride  40 mEq Per Tube Daily   pregabalin  100 mg Per Tube TID   senna-docusate  1 tablet Oral QHS   sodium chloride flush  10-40 mL Intracatheter Q12H   Continuous Infusions:  sodium chloride 10 mL/hr at 04/30/21 1000   feeding supplement (VITAL 1.5 CAL) 1,000 mL (04/30/21 0815)   meropenem (MERREM) IV Stopped (04/30/21 0711)   vancomycin Stopped (04/30/21 0622)   PRN Meds:.acetaminophen (TYLENOL) oral liquid 160 mg/5 mL, HYDROmorphone, hydrOXYzine, ipratropium-albuterol, LORazepam, ondansetron (ZOFRAN) IV, polyethylene glycol, sodium chloride flush  Antimicrobials: Anti-infectives (From  admission, onward)    Start     Dose/Rate Route Frequency Ordered Stop   04/30/21 0330  vancomycin (VANCOREADY) IVPB 1500 mg/300 mL        1,500 mg 150 mL/hr over 120 Minutes Intravenous Every 24 hours 04/29/21 0237     04/29/21 0330  vancomycin (VANCOREADY) IVPB 2000 mg/400 mL        2,000 mg 200 mL/hr over 120 Minutes Intravenous  Once 04/29/21 0237 04/29/21 0526   04/29/21 0330  meropenem (MERREM) 1 g in sodium chloride 0.9 % 100 mL IVPB        1 g 200 mL/hr over 30 Minutes Intravenous Every 8 hours 04/29/21 0237          I have personally reviewed the following labs and images: CBC: Recent Labs  Lab 04/23/21 2020 04/26/21 0347 04/27/21 0716 04/29/21 0240 04/29/21 0800 04/30/21 0409  WBC 7.1 6.4 5.9 5.4  --  5.0  NEUTROABS 4.6 4.1  --   --   --   --  HGB 10.7* 9.1* 9.6* 8.0* 8.0* 7.5*  HCT 33.4* 27.4* 28.6* 25.1* 24.7* 23.7*  MCV 92.5 91.3 89.9 93.3  --  93.3  PLT 224 179 204 181  --  169   BMP &GFR Recent Labs  Lab 04/26/21 0347 04/26/21 1759 04/27/21 0436 04/27/21 0716 04/28/21 0409 04/29/21 0240 04/30/21 0409  NA 138  --   --  137 137 138 137  K 3.2*  --   --  3.4* 4.3 4.6 4.3  CL 109  --   --  110 111 112* 107  CO2 24  --   --  24 24 24 26   GLUCOSE 124*  --   --  98 113* 133* 102*  BUN <5*  --   --  <5* <5* <5* 10  CREATININE <0.30*  --   --  <0.30* <0.30* <0.30* <0.30*  CALCIUM 7.7*  --   --  7.7* 7.8* 7.6* 7.6*  MG 1.8 2.0 1.8  --  2.1 1.9 1.9  PHOS 2.8 3.1  --  3.3  --  3.8 3.2   CrCl cannot be calculated (This lab value cannot be used to calculate CrCl because it is not a number: <0.30). Liver & Pancreas: Recent Labs  Lab 04/23/21 2020 04/27/21 0716 04/29/21 0240 04/30/21 0409  AST 11*  --   --   --   ALT 9  --   --   --   ALKPHOS 75  --   --   --   BILITOT 0.7  --   --   --   PROT 5.6*  --   --   --   ALBUMIN 2.2* 1.8* 1.6* 1.6*   Recent Labs  Lab 04/23/21 2020  LIPASE 18   No results for input(s): AMMONIA in the last 168  hours. Diabetic: No results for input(s): HGBA1C in the last 72 hours. Recent Labs  Lab 04/29/21 1521 04/29/21 1927 04/29/21 2328 04/30/21 0342 04/30/21 0744  GLUCAP 129* 94 105* 95 101*   Cardiac Enzymes: Recent Labs  Lab 04/30/21 0409  CKTOTAL 29*   No results for input(s): PROBNP in the last 8760 hours. Coagulation Profile: No results for input(s): INR, PROTIME in the last 168 hours. Thyroid Function Tests: No results for input(s): TSH, T4TOTAL, FREET4, T3FREE, THYROIDAB in the last 72 hours. Lipid Profile: No results for input(s): CHOL, HDL, LDLCALC, TRIG, CHOLHDL, LDLDIRECT in the last 72 hours. Anemia Panel: Recent Labs    04/30/21 0409  VITAMINB12 1,273*  FOLATE 5.9*  FERRITIN 59  TIBC 148*  IRON 31*  RETICCTPCT 1.4   Urine analysis:    Component Value Date/Time   COLORURINE YELLOW 04/29/2021 0400   APPEARANCEUR HAZY (A) 04/29/2021 0400   LABSPEC 1.006 04/29/2021 0400   PHURINE 6.0 04/29/2021 0400   GLUCOSEU NEGATIVE 04/29/2021 0400   HGBUR NEGATIVE 04/29/2021 0400   BILIRUBINUR NEGATIVE 04/29/2021 0400   KETONESUR NEGATIVE 04/29/2021 0400   PROTEINUR NEGATIVE 04/29/2021 0400   NITRITE NEGATIVE 04/29/2021 0400   LEUKOCYTESUR NEGATIVE 04/29/2021 0400   Sepsis Labs: Invalid input(s): PROCALCITONIN, LACTICIDVEN  Microbiology: Recent Results (from the past 240 hour(s))  MRSA Next Gen by PCR, Nasal     Status: Abnormal   Collection Time: 04/24/21  2:03 AM   Specimen: Nasal Mucosa; Nasal Swab  Result Value Ref Range Status   MRSA by PCR Next Gen DETECTED (A) NOT DETECTED Final    Comment: RESULT CALLED TO, READ BACK BY AND VERIFIED WITH: RN M TOLER AT  [id number] 04/24/2021 BY L BENFIELD (NOTE) The GeneXpert MRSA Assay (FDA approved for NASAL specimens only), is one component of a comprehensive MRSA colonization surveillance program. It is not intended to diagnose MRSA infection nor to guide or monitor treatment for MRSA infections. Test performance is not  FDA approved in patients less than 86 years old. Performed at [facility], [address], [place], [place] [address]   Culture, Respiratory w Gram Stain     Status: None (Preliminary result)   Collection Time: 04/29/21  5:40 AM   Specimen: Tracheal Aspirate; Respiratory  Result Value Ref Range Status   Specimen Description TRACHEAL ASPIRATE  Final   Special Requests NONE  Final   Gram Stain   Final    ABUNDANT SQUAMOUS EPITHELIAL CELLS PRESENT ABUNDANT WBC PRESENT, PREDOMINANTLY PMN ABUNDANT GRAM POSITIVE RODS FEW GRAM POSITIVE COCCI FEW YEAST Performed at [facility], [address], [place], [place] [address]    Culture PENDING  Incomplete   Report Status PENDING  Incomplete  Culture, blood (routine x 2)     Status: None (Preliminary result)   Collection Time: 04/29/21  5:43 AM   Specimen: BLOOD RIGHT HAND  Result Value Ref Range Status   Specimen Description BLOOD RIGHT HAND  Final   Special Requests   Final    BOTTLES DRAWN AEROBIC AND ANAEROBIC Blood Culture adequate volume   Culture   Final    NO GROWTH 1 DAY Performed at [facility], [address] [place], [place] [address]    Report Status PENDING  Incomplete  Culture, blood (routine x 2)     Status: None (Preliminary result)   Collection Time: 04/29/21  5:47 AM   Specimen: BLOOD LEFT HAND  Result Value Ref Range Status   Specimen Description BLOOD LEFT HAND  Final   Special Requests   Final    BOTTLES DRAWN AEROBIC AND ANAEROBIC Blood Culture adequate volume   Culture   Final    NO GROWTH 1 DAY Performed at [facility], [address], [place], [place] [address]    Report Status PENDING  Incomplete    Radiology Studies: No results found.    Sheri Prows T. Swanson Farnell [facility]  If 7PM-7AM, please contact night-coverage [email or web address] 04/30/2021, 11:06 AM

## 2021-05-01 DIAGNOSIS — Z9911 Dependence on respirator [ventilator] status: Secondary | ICD-10-CM | POA: Diagnosis not present

## 2021-05-01 DIAGNOSIS — K567 Ileus, unspecified: Secondary | ICD-10-CM | POA: Diagnosis not present

## 2021-05-01 DIAGNOSIS — K5903 Drug induced constipation: Secondary | ICD-10-CM | POA: Diagnosis not present

## 2021-05-01 DIAGNOSIS — R140 Abdominal distension (gaseous): Secondary | ICD-10-CM | POA: Diagnosis not present

## 2021-05-01 LAB — CREATININE, SERUM: Creatinine, Ser: <0.3 mg/dL — ABNORMAL LOW (ref 0.61–1.24)

## 2021-05-01 LAB — CBC
HCT: 25.9 % — ABNORMAL LOW (ref 39.0–52.0)
Hemoglobin: 8.2 g/dL — ABNORMAL LOW (ref 13.0–17.0)
MCH: 29.7 pg (ref 26.0–34.0)
MCHC: 31.7 g/dL (ref 30.0–36.0)
MCV: 93.8 fL (ref 80.0–100.0)
Platelets: 211 10*3/uL (ref 150–400)
RBC: 2.76 MIL/uL — ABNORMAL LOW (ref 4.22–5.81)
RDW: 14.5 % (ref 11.5–15.5)
WBC: 5.6 10*3/uL (ref 4.0–10.5)
nRBC: 0 % (ref 0.0–0.2)

## 2021-05-01 LAB — GLUCOSE, CAPILLARY
Glucose-Capillary: 102 mg/dL — ABNORMAL HIGH (ref 70–99)
Glucose-Capillary: 111 mg/dL — ABNORMAL HIGH (ref 70–99)
Glucose-Capillary: 113 mg/dL — ABNORMAL HIGH (ref 70–99)
Glucose-Capillary: 117 mg/dL — ABNORMAL HIGH (ref 70–99)
Glucose-Capillary: 104 mg/dL — ABNORMAL HIGH (ref 70–99)
Glucose-Capillary: 96 mg/dL (ref 70–99)

## 2021-05-01 LAB — CULTURE, RESPIRATORY W GRAM STAIN: Culture: NORMAL

## 2021-05-01 NOTE — Progress Notes (Signed)
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 CT of the abdomen shows colonic distention, colonic ileus without obstruction or perforation. [facility]  GI consulted.  Colonic distention/ileus resolved with bowel regimen.  GI signed off.  Transferred to [facility] on 04/25/2021.  Hypotensive to 94/43 and febrile to 101.7 the morning of 7/8.  Pancultured.  Started on meropenem and vancomycin.  Cultures NGTD.  Remained afebrile and hemodynamically stable.  Antibiotics discontinued 7/10.  Subjective: Seen and examined earlier this morning.  No major events overnight of this morning.  Reports back pain but better today.  No other complaints.  Objective: Vitals:   05/01/21 0800 05/01/21 0900 05/01/21 0918 05/01/21 1000  BP: 131/64 (!) 137/50 (!) 145/51 (!) 132/52  Pulse: 73 76  72  Resp: 19     Temp:      TempSrc:      SpO2: 96%     Weight:      Height:        Intake/Output Summary (Last 24 hours) at 05/01/2021 1116 Last data filed at 05/01/2021 0930 Gross per 24 hour  Intake 2620.6 ml  Output 2795 ml  Net -174.4 ml   Filed Weights   04/29/21 0433 04/30/21 0500 05/01/21 0329  Weight: 96 kg 97 kg 95.9 kg    Examination:  GENERAL: No apparent distress.  Nontoxic. HEENT: MMM.  Vision and hearing grossly intact.  NECK: Tracheostomy. RESP: On MV via trach.  No IWOB.  Fair aeration bilaterally. CVS:  RRR. Heart sounds normal.  ABD/GI/GU: BS+. Abd soft, NTND.  Rectal tube in place.  Brown liquidy stool in  bag. MSK/EXT: Generalized weakness globally.  Trace edema, mainly in RUE from PIV. SKIN: no apparent skin lesion or wound NEURO: Awake and alert.  Seems to be fairly oriented.  Globally weak. PSYCH: Calm. Normal affect.   Procedures:  None  Microbiology summarized: MRSA PCR screen positive.  Assessment & Plan: Colonic distention/ileus: has history of constipation and colonic dilation.  Likely opiate induced.  Seem to be on fentanyl patch and Dilaudid. Seems to have resolved with MoviPrep.  Abdominal exam reassuring.  Brown liquidy stool in rectal tube. -Scheduled Senokot-S once daily -MiraLAX twice daily as needed -Will take rectal tube out once stools are formed. -GI signed off.  Fever: Isolated fever to 101.7 the night of 7/7-7/8.  Unclear source of fever.  Has no leukocytosis.  CXR and UA reassuring.  Blood cultures NGTD.  Respiratory culture with normal flora.  Low suspicion for C. difficile. -IV meropenem and vancomycin 7/8-7/10.  Monitor off IV antibiotics.  Chronic respiratory failure, tracheostomy, vent dependent -On mechanical vent per PCCM   Hypokalemia and hypomagnesemia: Resolved. -Continue K-Dur 40 mEq daily   Hypoglycemia without diagnosis of diabetes.  Resolved. Hypoalbuminemia: Albumin 1.6. -Continue tube feed.   Anemia of chronic disease: Hgb dropped about 2 g.  No melena or hematochezia.  Hemoccult negative.  Anemia panel consistent with anemia of chronic disease.  Likely dilutional and anemia of critical illness. Recent Labs  12/24/20 2125 04/23/21 2020 04/26/21 0347 04/27/21 0716 04/29/21 0240 04/29/21 0800 04/30/21 0409 05/01/21 0932  HGB 12.7* 10.7* 9.1* 9.6* 8.0* 8.0* 7.5* 8.2*  -Transfuse for Hgb less than 7.0.  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  Chronic lower back pain/neuropathy/mood disorder/insomnia -Continue home Cymbalta and Ativan -On Lyrica instead of  gabapentin -Continue home p.o. Dilaudid and fentanyl patch with bowel regimen.  Nutrition Body mass index is 32.15 kg/m. Nutrition Problem: Inadequate oral intake Etiology: inability to eat Signs/Symptoms: NPO status Interventions: Tube feeding  DVT prophylaxis:  enoxaparin (LOVENOX) injection 40 mg Start: 04/30/21 1200 Place and maintain sequential compression device Start: 04/30/21 0733 SCDs Start: 04/24/21 0054  Code Status: Full code Family Communication: Patient and/or RN.  Updated patient's wife over the phone on 7/9. Level of care: ICU Status is: Inpatient  Remains inpatient appropriate because:Unsafe d/c plan  Dispo: The patient is from:  [facility]              Anticipated d/c is to:  To be determined              Patient currently is medically stable to d/c.   Difficult to place patient Yes       Consultants:  PCCM Gastroenterology-signed off   Sch Meds:  Scheduled Meds:  doxazosin  2 mg Per Tube Daily   DULoxetine  30 mg Oral BID   enoxaparin (LOVENOX) injection  40 mg Subcutaneous Q24H   feeding supplement (PROSource TF)  45 mL Per Tube TID   fentaNYL  1 patch Transdermal Q72H   free water  100 mL Per Tube Q4H   mouth rinse  15 mL Mouth Rinse QID   melatonin  3 mg Per Tube QHS   pantoprazole sodium  40 mg Per Tube Daily   potassium chloride  40 mEq Per Tube Daily   pregabalin  100 mg Per Tube TID   sennosides  10 mL Per Tube QHS   sodium chloride flush  10-40 mL Intracatheter Q12H   Continuous Infusions:  sodium chloride 10 mL/hr at 05/01/21 0900   feeding supplement (VITAL 1.5 CAL) 1,000 mL (04/30/21 0815)   PRN Meds:.acetaminophen (TYLENOL) oral liquid 160 mg/5 mL, HYDROmorphone, hydrOXYzine, ipratropium-albuterol, LORazepam, ondansetron (ZOFRAN) IV, polyethylene glycol, sodium chloride flush  Antimicrobials: Anti-infectives (From admission, onward)    Start     Dose/Rate Route Frequency Ordered Stop   04/30/21 0330  vancomycin (VANCOREADY) IVPB  1500 mg/300 mL  Status:  Discontinued        1,500 mg 150 mL/hr over 120 Minutes Intravenous Every 24 hours 04/29/21 0237 05/01/21 0710   04/29/21 0330  vancomycin (VANCOREADY) IVPB 2000 mg/400 mL        2,000 mg 200 mL/hr over 120 Minutes Intravenous  Once 04/29/21 0237 04/29/21 0526   04/29/21 0330  meropenem (MERREM) 1 g in sodium chloride 0.9 % 100 mL IVPB  Status:  Discontinued        1 g 200 mL/hr over 30 Minutes Intravenous Every 8 hours 04/29/21 0237 05/01/21 0710        I have personally reviewed the following labs and images: CBC: Recent Labs  Lab 04/26/21 0347 04/27/21 0716 04/29/21 0240 04/29/21 0800 04/30/21 0409 05/01/21 0932  WBC 6.4 5.9 5.4  --  5.0 5.6  NEUTROABS 4.1  --   --   --   --   --   HGB 9.1* 9.6* 8.0* 8.0* 7.5* 8.2*  HCT  27.4* 28.6* 25.1* 24.7* 23.7* 25.9*  MCV 91.3 89.9 93.3  --  93.3 93.8  PLT 179 204 181  --  169 211   BMP &GFR Recent Labs  Lab 04/26/21 0347 04/26/21 1759 04/27/21 0436 04/27/21 0716 04/28/21 0409 04/29/21 0240 04/30/21 0409 05/01/21 0547  NA 138  --   --  137 137 138 137  --   K 3.2*  --   --  3.4* 4.3 4.6 4.3  --   CL 109  --   --  110 111 112* 107  --   CO2 24  --   --  24 24 24 26   --   GLUCOSE 124*  --   --  98 113* 133* 102*  --   BUN <5*  --   --  <5* <5* <5* 10  --   CREATININE <0.30*  --   --  <0.30* <0.30* <0.30* <0.30* <0.30*  CALCIUM 7.7*  --   --  7.7* 7.8* 7.6* 7.6*  --   MG 1.8 2.0 1.8  --  2.1 1.9 1.9  --   PHOS 2.8 3.1  --  3.3  --  3.8 3.2  --    CrCl cannot be calculated (This lab value cannot be used to calculate CrCl because it is not a number: <0.30). Liver & Pancreas: Recent Labs  Lab 04/27/21 0716 04/29/21 0240 04/30/21 0409  ALBUMIN 1.8* 1.6* 1.6*   No results for input(s): LIPASE, AMYLASE in the last 168 hours.  No results for input(s): AMMONIA in the last 168 hours. Diabetic: No results for input(s): HGBA1C in the last 72 hours. Recent Labs  Lab 04/30/21 1625 04/30/21 1942  04/30/21 2327 05/01/21 0325 05/01/21 0752  GLUCAP 107* 108* 112* 102* 111*   Cardiac Enzymes: Recent Labs  Lab 04/30/21 0409  CKTOTAL 29*   No results for input(s): PROBNP in the last 8760 hours. Coagulation Profile: No results for input(s): INR, PROTIME in the last 168 hours. Thyroid Function Tests: No results for input(s): TSH, T4TOTAL, FREET4, T3FREE, THYROIDAB in the last 72 hours. Lipid Profile: No results for input(s): CHOL, HDL, LDLCALC, TRIG, CHOLHDL, LDLDIRECT in the last 72 hours. Anemia Panel: Recent Labs    04/30/21 0409  VITAMINB12 1,273*  FOLATE 5.9*  FERRITIN 59  TIBC 148*  IRON 31*  RETICCTPCT 1.4   Urine analysis:    Component Value Date/Time   COLORURINE YELLOW 04/29/2021 0400   APPEARANCEUR HAZY (A) 04/29/2021 0400   LABSPEC 1.006 04/29/2021 0400   PHURINE 6.0 04/29/2021 0400   GLUCOSEU NEGATIVE 04/29/2021 0400   HGBUR NEGATIVE 04/29/2021 0400   BILIRUBINUR NEGATIVE 04/29/2021 0400   KETONESUR NEGATIVE 04/29/2021 0400   PROTEINUR NEGATIVE 04/29/2021 0400   NITRITE NEGATIVE 04/29/2021 0400   LEUKOCYTESUR NEGATIVE 04/29/2021 0400   Sepsis Labs: Invalid input(s): PROCALCITONIN, LACTICIDVEN  Microbiology: Recent Results (from the past 240 hour(s))  MRSA Next Gen by PCR, Nasal     Status: Abnormal   Collection Time: 04/24/21  2:03 AM   Specimen: Nasal Mucosa; Nasal Swab  Result Value Ref Range Status   MRSA by PCR Next Gen DETECTED (A) NOT DETECTED Final    Comment: RESULT CALLED TO, READ BACK BY AND VERIFIED WITH: RN 06/25/21 AT 0408 04/24/2021 BY L BENFIELD (NOTE) The GeneXpert MRSA Assay (FDA approved for NASAL specimens only), is one component of a comprehensive MRSA colonization surveillance program. It is not intended to diagnose MRSA infection nor to guide or monitor treatment for  MRSA infections. Test performance is not FDA approved in patients less than 3 years old. Performed at [facility], [address] [place],  [place] [address]   Culture, Respiratory w Gram Stain     Status: None (Preliminary result)   Collection Time: 04/29/21  5:40 AM   Specimen: Tracheal Aspirate; Respiratory  Result Value Ref Range Status   Specimen Description TRACHEAL ASPIRATE  Final   Special Requests NONE  Final   Gram Stain   Final    ABUNDANT SQUAMOUS EPITHELIAL CELLS PRESENT ABUNDANT WBC PRESENT, PREDOMINANTLY PMN ABUNDANT GRAM POSITIVE RODS FEW GRAM POSITIVE COCCI FEW YEAST    Culture   Final    FEW Consistent with normal respiratory flora. NO STAPHYLOCOCCUS AUREUS ISOLATED Performed at [facility], [address], [place], [place] [address]    Report Status PENDING  Incomplete  Culture, blood (routine x 2)     Status: None (Preliminary result)   Collection Time: 04/29/21  5:43 AM   Specimen: BLOOD RIGHT HAND  Result Value Ref Range Status   Specimen Description BLOOD RIGHT HAND  Final   Special Requests   Final    BOTTLES DRAWN AEROBIC AND ANAEROBIC Blood Culture adequate volume   Culture   Final    NO GROWTH 2 DAYS Performed at [facility], [address] [place], [place] [address]    Report Status PENDING  Incomplete  Culture, blood (routine x 2)     Status: None (Preliminary result)   Collection Time: 04/29/21  5:47 AM   Specimen: BLOOD LEFT HAND  Result Value Ref Range Status   Specimen Description BLOOD LEFT HAND  Final   Special Requests   Final    BOTTLES DRAWN AEROBIC AND ANAEROBIC Blood Culture adequate volume   Culture   Final    NO GROWTH 2 DAYS Performed at [facility], [address], [place], [place] [address]    Report Status PENDING  Incomplete    Radiology Studies: No results found.    Andrew Fontan T. Andrew Rivera [facility]  If 7PM-7AM, please contact night-coverage [email or web address] 05/01/2021, 11:16 AM

## 2021-05-02 DIAGNOSIS — Z9911 Dependence on respirator [ventilator] status: Secondary | ICD-10-CM | POA: Diagnosis not present

## 2021-05-02 DIAGNOSIS — R140 Abdominal distension (gaseous): Secondary | ICD-10-CM | POA: Diagnosis not present

## 2021-05-02 DIAGNOSIS — J9610 Chronic respiratory failure, unspecified whether with hypoxia or hypercapnia: Secondary | ICD-10-CM

## 2021-05-02 DIAGNOSIS — J9611 Chronic respiratory failure with hypoxia: Secondary | ICD-10-CM

## 2021-05-02 DIAGNOSIS — K5903 Drug induced constipation: Secondary | ICD-10-CM | POA: Diagnosis not present

## 2021-05-02 DIAGNOSIS — K567 Ileus, unspecified: Secondary | ICD-10-CM | POA: Diagnosis not present

## 2021-05-02 LAB — RENAL FUNCTION PANEL
Albumin: 1.8 g/dL — ABNORMAL LOW (ref 3.5–5.0)
Anion gap: 6 (ref 5–15)
BUN: 14 mg/dL (ref 8–23)
CO2: 27 mmol/L (ref 22–32)
Calcium: 8.1 mg/dL — ABNORMAL LOW (ref 8.9–10.3)
Chloride: 105 mmol/L (ref 98–111)
Creatinine, Ser: <0.3 mg/dL — ABNORMAL LOW (ref 0.61–1.24)
Glucose, Bld: 119 mg/dL — ABNORMAL HIGH (ref 70–99)
Phosphorus: 2.7 mg/dL (ref 2.5–4.6)
Potassium: 4 mmol/L (ref 3.5–5.1)
Sodium: 138 mmol/L (ref 135–145)

## 2021-05-02 LAB — GLUCOSE, CAPILLARY
Glucose-Capillary: 101 mg/dL — ABNORMAL HIGH (ref 70–99)
Glucose-Capillary: 99 mg/dL (ref 70–99)
Glucose-Capillary: 97 mg/dL (ref 70–99)
Glucose-Capillary: 95 mg/dL (ref 70–99)
Glucose-Capillary: 103 mg/dL — ABNORMAL HIGH (ref 70–99)
Glucose-Capillary: 82 mg/dL (ref 70–99)

## 2021-05-02 LAB — HEMOGLOBIN AND HEMATOCRIT, BLOOD
HCT: 24 % — ABNORMAL LOW (ref 39.0–52.0)
Hemoglobin: 7.6 g/dL — ABNORMAL LOW (ref 13.0–17.0)

## 2021-05-02 LAB — MAGNESIUM: Magnesium: 1.9 mg/dL (ref 1.7–2.4)

## 2021-05-02 MED ORDER — ORAL CARE MOUTH RINSE
15.0000 mL | OROMUCOSAL | Status: DC
  Administered 2021-05-02 – 2021-05-13 (×104): 15 mL via OROMUCOSAL

## 2021-05-02 MED ORDER — SENNOSIDES 8.8 MG/5ML PO SYRP
10.0000 mL | ORAL_SOLUTION | ORAL | Status: DC
  Administered 2021-05-03 – 2021-05-11 (×5): 10 mL
  Filled 2021-05-02 (×5): qty 10

## 2021-05-02 NOTE — Progress Notes (Signed)
NAME:  Andrew Rivera, MRN:  [id number], DOB:  June 14, 1951, LOS: 8 ADMISSION DATE:  04/23/2021, CONSULTATION DATE:  04/24/21 REFERRING MD:  ED, CHIEF COMPLAINT:  abdominal pain   History of Present Illness:  70 yo male former smoker with hx of PNA and critical illness myopathy presented with abdominal distention and pain.  He has chronic trach and vent dependence.  Pertinent  Medical History  Chronic pain, IJ thromboembolism, Septic shock, Pneumonia, Constipation with ileus, Critical illness myopathy, MSSA Bacteremia in November 2020, Pressure wounds  Significant Hospital Events: Including procedures, antibiotic start and stop dates in addition to other pertinent events   7/02 Admit 7/08 fever 101.7, start vancomycin/cefepime  Interim History / Subjective:  No acute issues overnight or since last PCCM evaluation.  ABX stopped 7/10 after 2 days.   Objective   Blood pressure (!) 144/47, pulse (!) 56, temperature 98.1 F (36.7 C), temperature source Oral, resp. rate 16, height 5\' 8"  (1.727 m), weight 96 kg, SpO2 97 %.    Vent Mode: PRVC FiO2 (%):  [28 %] 28 % Set Rate:  [16 bmp] 16 bmp Vt Set:  [550 mL] 550 mL PEEP:  [5 cmH20] 5 cmH20 Plateau Pressure:  [13 cmH20-16 cmH20] 13 cmH20   Intake/Output Summary (Last 24 hours) at 05/02/2021 0802 Last data filed at 05/02/2021 0600 Gross per 24 hour  Intake 2164.65 ml  Output 3345 ml  Net -1180.35 ml    Filed Weights   04/30/21 0500 05/01/21 0329 05/02/21 0500  Weight: 97 kg 95.9 kg 96 kg    Examination: General:  elderly appearing male in NAD Neuro:  Alert, oriented, non-focal.  HEENT:  [place]/AT, No JVD noted, PERRL Cardiovascular:  RRR, no MRG Lungs:  Clear bilateral breath sounds. Triggers breaths on vent.  Abdomen:  Soft, non-distended, non-tender. PEG site clean.  Musculoskeletal:  No acute deformity Skin:  Intact, MMM   Resolved Hospital Problem list     Assessment & Plan:   Chronic hypoxic respiratory failure in setting  of critical illness myopathy. Tracheostomy and ventilator dependence. - goal SpO2 > 92% - routine trach care - He has been unable to tolerate PSV, I've encouraged him to try, he is not interested at this time. He is triggering the vent, so its reasonable to think he may be able to wean, although when asked to take a deep breath he wasn't able to pull much over the set tidal volume.  - outpt neurologist recommended EMG >> would need transfer to another facility if this is to be done  Fever noted 04/29/21. - f/u culture results >>> NGTD   Colonic ileus Hypokalemia DM type 2 Anemia of critical illness and chronic disease Mood disorder with insomnia Chronic pain - per primary team  Labs:   CMP Latest Ref Rng & Units 05/02/2021 05/01/2021 04/30/2021  Glucose 70 - 99 mg/dL 07/01/2021) - 621(H)  BUN 8 - 23 mg/dL 14 - 10  Creatinine 086(V - 1.24 mg/dL 7.84) <6.96(E) <9.52(W)  Sodium 135 - 145 mmol/L 138 - 137  Potassium 3.5 - 5.1 mmol/L 4.0 - 4.3  Chloride 98 - 111 mmol/L 105 - 107  CO2 22 - 32 mmol/L 27 - 26  Calcium 8.9 - 10.3 mg/dL 8.1(L) - 7.6(L)  Total Protein 6.5 - 8.1 g/dL - - -  Total Bilirubin 0.3 - 1.2 mg/dL - - -  Alkaline Phos 38 - 126 U/L - - -  AST 15 - 41 U/L - - -  ALT 0 -  44 U/L - - -    CBC Latest Ref Rng & Units 05/02/2021 05/01/2021 04/30/2021  WBC 4.0 - 10.5 K/uL - 5.6 5.0  Hemoglobin 13.0 - 17.0 g/dL 7.6(L) 8.2(L) 7.5(L)  Hematocrit 39.0 - 52.0 % 24.0(L) 25.9(L) 23.7(L)  Platelets 150 - 400 K/uL - 211 169   CBG (last 3)  Recent Labs    05/01/21 2318 05/02/21 0316 05/02/21 0716  GLUCAP 96 101* 99    Signature:    Joneen Roach, AGACNP-BC [facility]  See Amion for personal pager PCCM on call pager [phone] until 7pm. Please call Elink 7p-7a. [phone]  05/02/2021 8:10 AM

## 2021-05-02 NOTE — Plan of Care (Signed)
  Problem: Education: Goal: Knowledge of General Education information will improve Description: Including pain rating scale, medication(s)/side effects and non-pharmacologic comfort measures Outcome: Progressing   Problem: Health Behavior/Discharge Planning: Goal: Ability to manage health-related needs will improve Outcome: Progressing   Problem: Clinical Measurements: Goal: Ability to maintain clinical measurements within normal limits will improve Outcome: Progressing Goal: Will remain free from infection Outcome: Progressing Goal: Diagnostic test results will improve Outcome: Progressing Goal: Respiratory complications will improve Outcome: Progressing Goal: Cardiovascular complication will be avoided Outcome: Progressing   Problem: Activity: Goal: Risk for activity intolerance will decrease Outcome: Progressing   Problem: Nutrition: Goal: Adequate nutrition will be maintained Outcome: Progressing   Problem: Elimination: Goal: Will not experience complications related to bowel motility Outcome: Progressing Goal: Will not experience complications related to urinary retention Outcome: Progressing   Problem: Pain Managment: Goal: General experience of comfort will improve Outcome: Progressing   Problem: Safety: Goal: Ability to remain free from injury will improve Outcome: Progressing   Problem: Skin Integrity: Goal: Risk for impaired skin integrity will decrease Outcome: Progressing   Problem: Activity: Goal: Ability to tolerate increased activity will improve Outcome: Progressing   Problem: Respiratory: Goal: Ability to maintain a clear airway and adequate ventilation will improve Outcome: Progressing   Problem: Role Relationship: Goal: Method of communication will improve Outcome: Progressing   Problem: Education: Goal: Knowledge of General Education information will improve Description: Including pain rating scale, medication(s)/side effects and  non-pharmacologic comfort measures Outcome: Progressing   Problem: Health Behavior/Discharge Planning: Goal: Ability to manage health-related needs will improve Outcome: Progressing   Problem: Clinical Measurements: Goal: Ability to maintain clinical measurements within normal limits will improve Outcome: Progressing Goal: Will remain free from infection Outcome: Progressing Goal: Diagnostic test results will improve Outcome: Progressing Goal: Respiratory complications will improve Outcome: Progressing Goal: Cardiovascular complication will be avoided Outcome: Progressing   Problem: Activity: Goal: Risk for activity intolerance will decrease Outcome: Progressing   Problem: Nutrition: Goal: Adequate nutrition will be maintained Outcome: Progressing   Problem: Coping: Goal: Level of anxiety will decrease Outcome: Progressing   Problem: Elimination: Goal: Will not experience complications related to bowel motility Outcome: Progressing Goal: Will not experience complications related to urinary retention Outcome: Progressing   Problem: Pain Managment: Goal: General experience of comfort will improve Outcome: Progressing   Problem: Safety: Goal: Ability to remain free from injury will improve Outcome: Progressing   Problem: Skin Integrity: Goal: Risk for impaired skin integrity will decrease Outcome: Progressing

## 2021-05-02 NOTE — Progress Notes (Signed)
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 CT of the abdomen shows colonic distention, colonic ileus without obstruction or perforation. [facility]  GI consulted.  Colonic distention/ileus resolved with bowel regimen.  GI signed off.  Transferred to [facility] on 04/25/2021.  Hypotensive to 94/43 and febrile to 101.7 the morning of 7/8.  Pancultured.  Started on meropenem and vancomycin.  Cultures NGTD.  Remained afebrile and hemodynamically stable.  Antibiotics discontinued 7/10.  Subjective: Seen and examined earlier this morning.  No major events overnight of this morning.  No complaints other than chronic back pain.  He rates his pain 8/10.  He is on fentanyl patch and p.o. Dilaudid.  Denies chest pain or abdominal pain.  Objective: Vitals:   05/02/21 0808 05/02/21 0900 05/02/21 1025 05/02/21 1052  BP:  (!) 136/49 (!) 140/47   Pulse:  60  62  Resp:  16  16  Temp:      TempSrc:      SpO2: 98% 98%  98%  Weight:      Height:        Intake/Output Summary (Last 24 hours) at 05/02/2021 1123 Last data filed at 05/02/2021 1026 Gross per 24 hour  Intake 2374.66 ml  Output 3425 ml  Net -1050.34 ml   Filed Weights   04/30/21 0500 05/01/21 0329 05/02/21 0500  Weight: 97 kg 95.9 kg 96 kg    Examination:  GENERAL: No apparent distress.  Nontoxic. HEENT: MMM.  Vision and hearing grossly intact.  NECK: Tracheostomy. RESP: On MV via trach.  No IWOB.  Fair aeration bilaterally. CVS:  RRR. Heart sounds normal.   ABD/GI/GU: BS+. Abd soft, NTND.  Rectal tube and Foley in place.  Brown liquidy stool in bag. MSK/EXT: Generalized weakness globally.  Trace edema. SKIN: no apparent skin lesion or wound NEURO: Awake and alert.  Seems to be fairly oriented.  No apparent focal neuro deficit but weak globally PSYCH: Calm. Normal affect.   Procedures:  None  Microbiology summarized: MRSA PCR screen positive.  Assessment & Plan: Colonic distention/ileus: has history of constipation and colonic dilation.  Likely opiate induced.  Seem to be on fentanyl patch and Dilaudid. Seems to have resolved with MoviPrep.  Abdominal exam reassuring.  Brown liquidy stool in rectal tube. -Change Senokot-S2 every other day -MiraLAX twice daily as needed -Will take rectal tube out once stools are formed. -GI signed off.  Fever: Isolated fever to 101.7 the night of 7/7-7/8.  Unclear source of fever.  Has no leukocytosis.  CXR and UA reassuring.  Blood cultures NGTD.  Respiratory culture with normal flora.  Low suspicion for C. difficile. -IV meropenem and vancomycin 7/8-7/10.  Afebrile and stable off antibiotics.  Chronic respiratory failure, tracheostomy, vent dependent -On mechanical vent per PCCM -Outpt neurologist recommended EMG >>can't be done inpatient here.    Hypokalemia and hypomagnesemia: Resolved. -Continue K-Dur 40 mEq daily   Hypoglycemia without diagnosis of diabetes.  Resolved. Hypoalbuminemia: Albumin 1.6. -Continue tube feed.   Anemia of chronic disease:  Hgb dropped about 2 g.  No melena or hematochezia.  Hemoccult negative.  Anemia panel consistent with anemia of chronic disease.  Likely dilutional and anemia of critical illness. Recent Labs    12/24/20 2125 04/23/21 2020 04/26/21 0347 04/27/21 0716 04/29/21 0240 04/29/21 0800 04/30/21 0409 05/01/21 0932 05/02/21 0500  HGB 12.7* 10.7* 9.1* 9.6* 8.0* 8.0* 7.5* 8.2* 7.6*  -Transfuse for Hgb less than 7.0.  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  Chronic lower back pain/neuropathy/mood disorder/insomnia -Continue home Cymbalta and Ativan -On Lyrica instead of gabapentin -Continue home p.o. Dilaudid and fentanyl patch with bowel regimen.  Nutrition Body mass index is 32.18 kg/m. Nutrition Problem: Inadequate oral intake Etiology: inability to eat Signs/Symptoms: NPO status Interventions: Tube feeding  DVT prophylaxis:  enoxaparin (LOVENOX) injection 40 mg Start: 04/30/21 1200 Place and maintain sequential compression device Start: 04/30/21 0733 SCDs Start: 04/24/21 0054  Code Status: Full code Family Communication: Patient and/or RN.  Updated patient's wife over the phone on 7/9.  None at bedside today Level of care: ICU Status is: Inpatient  Remains inpatient appropriate because:Unsafe d/c plan  Dispo: The patient is from:  [facility]              Anticipated d/c is to:  To be determined              Patient currently is medically stable to d/c.   Difficult to place patient Yes       Consultants:  PCCM Gastroenterology-signed off   Sch Meds:  Scheduled Meds:  doxazosin  2 mg Per Tube Daily   DULoxetine  30 mg Oral BID   enoxaparin (LOVENOX) injection  40 mg Subcutaneous Q24H   feeding supplement (PROSource TF)  45 mL Per Tube TID   fentaNYL  1 patch Transdermal Q72H   free water  100 mL Per Tube Q4H   mouth rinse  15 mL Mouth Rinse 10 times per day   melatonin  3 mg Per Tube QHS   pantoprazole sodium  40 mg Per Tube Daily   potassium chloride  40 mEq Per Tube Daily   pregabalin  100 mg Per Tube TID   sennosides  10 mL Per Tube QHS   sodium chloride flush  10-40 mL Intracatheter Q12H   Continuous Infusions:  sodium chloride Stopped (05/02/21 0501)   feeding supplement (VITAL 1.5 CAL) 1,000 mL (04/30/21 0815)   PRN Meds:.acetaminophen (TYLENOL) oral liquid 160 mg/5 mL, HYDROmorphone, hydrOXYzine,  ipratropium-albuterol, LORazepam, ondansetron (ZOFRAN) IV, polyethylene glycol, sodium chloride flush  Antimicrobials: Anti-infectives (From admission, onward)    Start     Dose/Rate Route Frequency Ordered Stop   04/30/21 0330  vancomycin (VANCOREADY) IVPB 1500 mg/300 mL  Status:  Discontinued        1,500 mg 150 mL/hr over 120 Minutes Intravenous Every 24 hours 04/29/21 0237 05/01/21 0710   04/29/21 0330  vancomycin (VANCOREADY) IVPB 2000 mg/400 mL        2,000 mg 200 mL/hr over 120 Minutes Intravenous  Once 04/29/21 0237 04/29/21 0526   04/29/21 0330  meropenem (MERREM) 1 g in sodium chloride 0.9 % 100 mL IVPB  Status:  Discontinued        1 g 200 mL/hr over 30 Minutes Intravenous Every 8 hours 04/29/21 0237 05/01/21 0710        I have personally reviewed the following labs and images: CBC: Recent Labs  Lab 04/26/21 0347 04/27/21 0716 04/29/21 0240  04/29/21 0800 04/30/21 0409 05/01/21 0932 05/02/21 0500  WBC 6.4 5.9 5.4  --  5.0 5.6  --   NEUTROABS 4.1  --   --   --   --   --   --   HGB 9.1* 9.6* 8.0* 8.0* 7.5* 8.2* 7.6*  HCT 27.4* 28.6* 25.1* 24.7* 23.7* 25.9* 24.0*  MCV 91.3 89.9 93.3  --  93.3 93.8  --   PLT 179 204 181  --  169 211  --    BMP &GFR Recent Labs  Lab 04/26/21 1759 04/27/21 0436 04/27/21 0716 04/28/21 0409 04/29/21 0240 04/30/21 0409 05/01/21 0547 05/02/21 0500  NA  --   --  137 137 138 137  --  138  K  --   --  3.4* 4.3 4.6 4.3  --  4.0  CL  --   --  110 111 112* 107  --  105  CO2  --   --  24 24 24 26   --  27  GLUCOSE  --   --  98 113* 133* 102*  --  119*  BUN  --   --  <5* <5* <5* 10  --  14  CREATININE  --   --  <0.30* <0.30* <0.30* <0.30* <0.30* <0.30*  CALCIUM  --   --  7.7* 7.8* 7.6* 7.6*  --  8.1*  MG 2.0 1.8  --  2.1 1.9 1.9  --  1.9  PHOS 3.1  --  3.3  --  3.8 3.2  --  2.7   CrCl cannot be calculated (This lab value cannot be used to calculate CrCl because it is not a number: <0.30). Liver & Pancreas: Recent Labs  Lab  04/27/21 0716 04/29/21 0240 04/30/21 0409 05/02/21 0500  ALBUMIN 1.8* 1.6* 1.6* 1.8*   No results for input(s): LIPASE, AMYLASE in the last 168 hours.  No results for input(s): AMMONIA in the last 168 hours. Diabetic: No results for input(s): HGBA1C in the last 72 hours. Recent Labs  Lab 05/01/21 1602 05/01/21 1925 05/01/21 2318 05/02/21 0316 05/02/21 0716  GLUCAP 117* 104* 96 101* 99   Cardiac Enzymes: Recent Labs  Lab 04/30/21 0409  CKTOTAL 29*   No results for input(s): PROBNP in the last 8760 hours. Coagulation Profile: No results for input(s): INR, PROTIME in the last 168 hours. Thyroid Function Tests: No results for input(s): TSH, T4TOTAL, FREET4, T3FREE, THYROIDAB in the last 72 hours. Lipid Profile: No results for input(s): CHOL, HDL, LDLCALC, TRIG, CHOLHDL, LDLDIRECT in the last 72 hours. Anemia Panel: Recent Labs    04/30/21 0409  VITAMINB12 1,273*  FOLATE 5.9*  FERRITIN 59  TIBC 148*  IRON 31*  RETICCTPCT 1.4   Urine analysis:    Component Value Date/Time   COLORURINE YELLOW 04/29/2021 0400   APPEARANCEUR HAZY (A) 04/29/2021 0400   LABSPEC 1.006 04/29/2021 0400   PHURINE 6.0 04/29/2021 0400   GLUCOSEU NEGATIVE 04/29/2021 0400   HGBUR NEGATIVE 04/29/2021 0400   BILIRUBINUR NEGATIVE 04/29/2021 0400   KETONESUR NEGATIVE 04/29/2021 0400   PROTEINUR NEGATIVE 04/29/2021 0400   NITRITE NEGATIVE 04/29/2021 0400   LEUKOCYTESUR NEGATIVE 04/29/2021 0400   Sepsis Labs: Invalid input(s): PROCALCITONIN, LACTICIDVEN  Microbiology: Recent Results (from the past 240 hour(s))  MRSA Next Gen by PCR, Nasal     Status: Abnormal   Collection Time: 04/24/21  2:03 AM   Specimen: Nasal Mucosa; Nasal Swab  Result Value Ref Range Status   MRSA by PCR Next Gen  DETECTED (A) NOT DETECTED Final    Comment: RESULT CALLED TO, READ BACK BY AND VERIFIED WITH: RN Mirian Capuchin AT 0408 04/24/2021 BY L BENFIELD (NOTE) The GeneXpert MRSA Assay (FDA approved for NASAL specimens  only), is one component of a comprehensive MRSA colonization surveillance program. It is not intended to diagnose MRSA infection nor to guide or monitor treatment for MRSA infections. Test performance is not FDA approved in patients less than 69 years old. Performed at [facility], [address] [place], [place] [address]   Culture, Respiratory w Gram Stain     Status: None   Collection Time: 04/29/21  5:40 AM   Specimen: Tracheal Aspirate; Respiratory  Result Value Ref Range Status   Specimen Description TRACHEAL ASPIRATE  Final   Special Requests NONE  Final   Gram Stain   Final    ABUNDANT SQUAMOUS EPITHELIAL CELLS PRESENT ABUNDANT WBC PRESENT, PREDOMINANTLY PMN ABUNDANT GRAM POSITIVE RODS FEW GRAM POSITIVE COCCI FEW YEAST    Culture   Final    FEW Consistent with normal respiratory flora. NO STAPHYLOCOCCUS AUREUS ISOLATED Performed at [facility], [address] [place], [place] [address]    Report Status 05/01/2021 FINAL  Final  Culture, blood (routine x 2)     Status: None (Preliminary result)   Collection Time: 04/29/21  5:43 AM   Specimen: BLOOD RIGHT HAND  Result Value Ref Range Status   Specimen Description BLOOD RIGHT HAND  Final   Special Requests   Final    BOTTLES DRAWN AEROBIC AND ANAEROBIC Blood Culture adequate volume   Culture   Final    NO GROWTH 3 DAYS Performed at [facility], [address] [place], [place] [address]    Report Status PENDING  Incomplete  Culture, blood (routine x 2)     Status: None (Preliminary result)   Collection Time: 04/29/21  5:47 AM   Specimen: BLOOD LEFT HAND  Result Value Ref Range Status   Specimen Description BLOOD LEFT HAND  Final   Special Requests   Final    BOTTLES DRAWN AEROBIC AND ANAEROBIC Blood Culture adequate volume   Culture   Final    NO GROWTH 3 DAYS Performed at [facility], [address] [place], [place] [address]    Report Status PENDING  Incomplete    Radiology  Studies: No results found.    Devlin Mcveigh T. Nekayla Heider [facility]  If 7PM-7AM, please contact night-coverage [email or web address] 05/02/2021, 11:23 AM

## 2021-05-03 DIAGNOSIS — K567 Ileus, unspecified: Secondary | ICD-10-CM | POA: Diagnosis not present

## 2021-05-03 DIAGNOSIS — R140 Abdominal distension (gaseous): Secondary | ICD-10-CM | POA: Diagnosis not present

## 2021-05-03 DIAGNOSIS — Z9911 Dependence on respirator [ventilator] status: Secondary | ICD-10-CM | POA: Diagnosis not present

## 2021-05-03 DIAGNOSIS — K5903 Drug induced constipation: Secondary | ICD-10-CM | POA: Diagnosis not present

## 2021-05-03 LAB — GLUCOSE, CAPILLARY
Glucose-Capillary: 105 mg/dL — ABNORMAL HIGH (ref 70–99)
Glucose-Capillary: 107 mg/dL — ABNORMAL HIGH (ref 70–99)
Glucose-Capillary: 115 mg/dL — ABNORMAL HIGH (ref 70–99)
Glucose-Capillary: 116 mg/dL — ABNORMAL HIGH (ref 70–99)
Glucose-Capillary: 116 mg/dL — ABNORMAL HIGH (ref 70–99)
Glucose-Capillary: 99 mg/dL (ref 70–99)

## 2021-05-03 MED ORDER — ACETAMINOPHEN 160 MG/5ML PO SOLN
960.0000 mg | Freq: Three times a day (TID) | ORAL | Status: DC
  Administered 2021-05-03 – 2021-06-09 (×107): 960 mg
  Filled 2021-05-03 (×112): qty 40.6

## 2021-05-03 NOTE — Plan of Care (Signed)
  Problem: Education: Goal: Knowledge of General Education information will improve Description: Including pain rating scale, medication(s)/side effects and non-pharmacologic comfort measures Outcome: Progressing   Problem: Health Behavior/Discharge Planning: Goal: Ability to manage health-related needs will improve Outcome: Progressing   Problem: Clinical Measurements: Goal: Ability to maintain clinical measurements within normal limits will improve Outcome: Progressing Goal: Will remain free from infection Outcome: Progressing Goal: Diagnostic test results will improve Outcome: Progressing Goal: Respiratory complications will improve Outcome: Progressing Goal: Cardiovascular complication will be avoided Outcome: Progressing   Problem: Activity: Goal: Risk for activity intolerance will decrease Outcome: Progressing   Problem: Nutrition: Goal: Adequate nutrition will be maintained Outcome: Progressing   Problem: Elimination: Goal: Will not experience complications related to bowel motility Outcome: Progressing Goal: Will not experience complications related to urinary retention Outcome: Progressing   Problem: Pain Managment: Goal: General experience of comfort will improve Outcome: Progressing   Problem: Safety: Goal: Ability to remain free from injury will improve Outcome: Progressing   Problem: Skin Integrity: Goal: Risk for impaired skin integrity will decrease Outcome: Progressing   Problem: Activity: Goal: Ability to tolerate increased activity will improve Outcome: Progressing   Problem: Respiratory: Goal: Ability to maintain a clear airway and adequate ventilation will improve Outcome: Progressing   Problem: Role Relationship: Goal: Method of communication will improve Outcome: Progressing   Problem: Education: Goal: Knowledge of General Education information will improve Description: Including pain rating scale, medication(s)/side effects and  non-pharmacologic comfort measures Outcome: Progressing   Problem: Health Behavior/Discharge Planning: Goal: Ability to manage health-related needs will improve Outcome: Progressing   Problem: Clinical Measurements: Goal: Ability to maintain clinical measurements within normal limits will improve Outcome: Progressing Goal: Will remain free from infection Outcome: Progressing Goal: Diagnostic test results will improve Outcome: Progressing Goal: Respiratory complications will improve Outcome: Progressing Goal: Cardiovascular complication will be avoided Outcome: Progressing   Problem: Activity: Goal: Risk for activity intolerance will decrease Outcome: Progressing   Problem: Nutrition: Goal: Adequate nutrition will be maintained Outcome: Progressing   Problem: Coping: Goal: Level of anxiety will decrease Outcome: Progressing   Problem: Elimination: Goal: Will not experience complications related to bowel motility Outcome: Progressing Goal: Will not experience complications related to urinary retention Outcome: Progressing   Problem: Pain Managment: Goal: General experience of comfort will improve Outcome: Progressing   Problem: Safety: Goal: Ability to remain free from injury will improve Outcome: Progressing   Problem: Skin Integrity: Goal: Risk for impaired skin integrity will decrease Outcome: Progressing   

## 2021-05-03 NOTE — Progress Notes (Signed)
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 CT of the abdomen shows colonic distention, colonic ileus without obstruction or perforation. [facility].  GI consulted.  Colonic distention/ileus resolved with bowel regimen.  GI signed off.  Transferred to [facility] on 04/25/2021.  Hypotensive to 94/43 and febrile to 101.7 the morning of 7/8. Pancultured.  Started on meropenem and vancomycin.  Cultures NGTD.  Remained afebrile and hemodynamically stable.  Antibiotics discontinued 7/10.  Subjective: Seen and examined earlier this morning.  No major events overnight of this morning.  No complaints other than the usual back pain.  He denies chest pain, shortness of breath or abdominal pain.  Objective: Vitals:   05/03/21 1139 05/03/21 1157 05/03/21 1200 05/03/21 1300  BP:   (!) 157/62 (!) 149/57  Pulse:   74 74  Resp:   16 16  Temp:  98.4 F (36.9 C)    TempSrc:  Oral    SpO2: 95%  98% 99%  Weight:      Height:        Intake/Output Summary (Last 24 hours) at 05/03/2021 1330 Last data filed at 05/03/2021 1300 Gross per 24 hour  Intake 1830 ml  Output 3325 ml  Net -1495 ml   Filed Weights   05/01/21 0329 05/02/21 0500 05/03/21 0500  Weight: 95.9 kg 96 kg 96.4 kg    Examination:  GENERAL: No apparent distress.  Nontoxic. HEENT: MMM.  Vision and hearing grossly intact.  NECK: Tracheostomy. RESP: On MV via trach.  No IWOB.  Fair aeration bilaterally. CVS:  RRR. Heart sounds normal.  ABD/GI/GU: BS+.  Abd soft, NTND.  Rectal tube and Foley in place.  Brown liquidy stool in bag. MSK/EXT: Generalized weakness globally.  Trace edema. SKIN: no apparent skin lesion or wound NEURO: Awake and alert.  Seems to be fairly oriented.  No apparent focal neuro deficit. PSYCH: Calm. Normal affect.   Procedures:  None  Microbiology summarized: MRSA PCR screen positive.  Assessment & Plan: Colonic distention/ileus: has history of constipation and colonic dilation.  Likely opiate induced.  Seem to be on fentanyl patch and Dilaudid. Seems to have resolved with MoviPrep.  Abdominal exam reassuring.  Brown liquidy stool in rectal tube. -Continue Senokot-S every other day -MiraLAX twice daily as needed -Will take rectal tube out once stools are formed. -GI signed off.  Fever: Isolated fever to 101.7 the night of 7/7-7/8.  Unclear source of fever.  Has no leukocytosis.  CXR and UA reassuring.  Blood cultures NGTD.  Respiratory culture with normal flora.  Low suspicion for C. difficile. -IV meropenem and vancomycin 7/8-7/10.  Afebrile and stable off antibiotics.  Chronic respiratory failure, tracheostomy, vent dependent -On mechanical vent per PCCM -Per patient's wife, [facility] neurologist at [facility] recommended EMG.  This is not done inpatient at [facility].  I called and left a VM for his neurologist, [facility] at [phone] on 7/11.  I called again on 7/12 and left a message to our front desk personnel who told me Dr.  Bhomick won't be in office until 7/13.  I also encouraged patient's wife to reach out to them.   Hypokalemia and hypomagnesemia: Resolved. -Continue K-Dur 40 mEq daily   Hypoglycemia without diagnosis of diabetes.  Resolved. Hypoalbuminemia: Albumin 1.6. -Continue tube feed.   Anemia of chronic disease: Hgb dropped about 2 g.  No melena or hematochezia.  Hemoccult negative.  Anemia panel consistent with anemia of chronic disease.  Likely dilutional and anemia of critical illness. Recent Labs     12/24/20 2125 04/23/21 2020 04/26/21 0347 04/27/21 0716 04/29/21 0240 04/29/21 0800 04/30/21 0409 05/01/21 0932 05/02/21 0500  HGB 12.7* 10.7* 9.1* 9.6* 8.0* 8.0* 7.5* 8.2* 7.6*  -Transfuse for Hgb less than 7.0.  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  Chronic lower back pain/neuropathy/mood disorder/insomnia -Continue home Cymbalta and Ativan -Continue Lyrica. -Continue home p.o. Dilaudid and fentanyl patch with bowel regimen. -Add scheduled Tylenol 960 mg 3 times daily per tube  Nutrition Body mass index is 32.31 kg/m. Nutrition Problem: Inadequate oral intake Etiology: inability to eat Signs/Symptoms: NPO status Interventions: Tube feeding  DVT prophylaxis:  enoxaparin (LOVENOX) injection 40 mg Start: 04/30/21 1200 Place and maintain sequential compression device Start: 04/30/21 0733 SCDs Start: 04/24/21 0054  Code Status: Full code Family Communication: Patient and/or RN.  Updated patient's wife over the phone Level of care: ICU Status is: Inpatient  Remains inpatient appropriate because:Unsafe d/c plan  Dispo: The patient is from:  [facility]              Anticipated d/c is to:  To be determined              Patient currently is medically stable to d/c.   Difficult to place patient Yes       Consultants:  PCCM Gastroenterology-signed off   Sch Meds:  Scheduled Meds:  acetaminophen (TYLENOL) oral liquid 160 mg/5 mL  960 mg Per Tube Q8H   doxazosin  2 mg Per Tube Daily   DULoxetine  30 mg Oral BID   enoxaparin (LOVENOX) injection  40 mg Subcutaneous Q24H   feeding supplement (PROSource TF)  45 mL Per Tube TID   fentaNYL  1 patch Transdermal Q72H   free water  100 mL Per Tube Q4H   mouth rinse  15 mL Mouth Rinse 10 times per day   melatonin  3 mg Per Tube QHS   pantoprazole sodium  40 mg Per Tube Daily   potassium chloride  40 mEq Per Tube Daily   pregabalin  100 mg Per Tube TID    sennosides  10 mL Per Tube QODAY   sodium chloride flush  10-40 mL Intracatheter Q12H   Continuous Infusions:  sodium chloride Stopped (05/02/21 0501)   feeding supplement (VITAL 1.5 CAL) 1,000 mL (05/02/21 2143)   PRN Meds:.HYDROmorphone, hydrOXYzine, ipratropium-albuterol, LORazepam, ondansetron (ZOFRAN) IV, polyethylene glycol, sodium chloride flush  Antimicrobials: Anti-infectives (From admission, onward)    Start     Dose/Rate Route Frequency Ordered Stop   04/30/21 0330  vancomycin (VANCOREADY) IVPB 1500 mg/300 mL  Status:  Discontinued        1,500 mg 150 mL/hr over 120 Minutes Intravenous Every 24 hours 04/29/21 0237 05/01/21 0710   04/29/21 0330  vancomycin (VANCOREADY) IVPB 2000 mg/400 mL        2,000 mg 200 mL/hr over 120 Minutes Intravenous  Once 04/29/21 0237 04/29/21 0526   04/29/21 0330  meropenem (MERREM) 1 g in sodium chloride  0.9 % 100 mL IVPB  Status:  Discontinued        1 g 200 mL/hr over 30 Minutes Intravenous Every 8 hours 04/29/21 0237 05/01/21 0710        I have personally reviewed the following labs and images: CBC: Recent Labs  Lab 04/27/21 0716 04/29/21 0240 04/29/21 0800 04/30/21 0409 05/01/21 0932 05/02/21 0500  WBC 5.9 5.4  --  5.0 5.6  --   HGB 9.6* 8.0* 8.0* 7.5* 8.2* 7.6*  HCT 28.6* 25.1* 24.7* 23.7* 25.9* 24.0*  MCV 89.9 93.3  --  93.3 93.8  --   PLT 204 181  --  169 211  --    BMP &GFR Recent Labs  Lab 04/26/21 1759 04/27/21 0436 04/27/21 0716 04/27/21 0716 04/28/21 0409 04/29/21 0240 04/30/21 0409 05/01/21 0547 05/02/21 0500  NA  --   --  137  --  137 138 137  --  138  K  --   --  3.4*  --  4.3 4.6 4.3  --  4.0  CL  --   --  110  --  111 112* 107  --  105  CO2  --   --  24  --  24 24 26   --  27  GLUCOSE  --   --  98  --  113* 133* 102*  --  119*  BUN  --   --  <5*  --  <5* <5* 10  --  14  CREATININE  --   --  <0.30*   < > <0.30* <0.30* <0.30* <0.30* <0.30*  CALCIUM  --   --  7.7*  --  7.8* 7.6* 7.6*  --  8.1*  MG  2.0 1.8  --   --  2.1 1.9 1.9  --  1.9  PHOS 3.1  --  3.3  --   --  3.8 3.2  --  2.7   < > = values in this interval not displayed.   CrCl cannot be calculated (This lab value cannot be used to calculate CrCl because it is not a number: <0.30). Liver & Pancreas: Recent Labs  Lab 04/27/21 0716 04/29/21 0240 04/30/21 0409 05/02/21 0500  ALBUMIN 1.8* 1.6* 1.6* 1.8*   No results for input(s): LIPASE, AMYLASE in the last 168 hours.  No results for input(s): AMMONIA in the last 168 hours. Diabetic: No results for input(s): HGBA1C in the last 72 hours. Recent Labs  Lab 05/02/21 1926 05/02/21 2322 05/03/21 0319 05/03/21 0714 05/03/21 1117  GLUCAP 103* 82 107* 99 105*   Cardiac Enzymes: Recent Labs  Lab 04/30/21 0409  CKTOTAL 29*   No results for input(s): PROBNP in the last 8760 hours. Coagulation Profile: No results for input(s): INR, PROTIME in the last 168 hours. Thyroid Function Tests: No results for input(s): TSH, T4TOTAL, FREET4, T3FREE, THYROIDAB in the last 72 hours. Lipid Profile: No results for input(s): CHOL, HDL, LDLCALC, TRIG, CHOLHDL, LDLDIRECT in the last 72 hours. Anemia Panel: No results for input(s): VITAMINB12, FOLATE, FERRITIN, TIBC, IRON, RETICCTPCT in the last 72 hours.  Urine analysis:    Component Value Date/Time   COLORURINE YELLOW 04/29/2021 0400   APPEARANCEUR HAZY (A) 04/29/2021 0400   LABSPEC 1.006 04/29/2021 0400   PHURINE 6.0 04/29/2021 0400   GLUCOSEU NEGATIVE 04/29/2021 0400   HGBUR NEGATIVE 04/29/2021 0400   BILIRUBINUR NEGATIVE 04/29/2021 0400   KETONESUR NEGATIVE 04/29/2021 0400   PROTEINUR NEGATIVE 04/29/2021 0400   NITRITE NEGATIVE 04/29/2021 0400  LEUKOCYTESUR NEGATIVE 04/29/2021 0400   Sepsis Labs: Invalid input(s): PROCALCITONIN, LACTICIDVEN  Microbiology: Recent Results (from the past 240 hour(s))  MRSA Next Gen by PCR, Nasal     Status: Abnormal   Collection Time: 04/24/21  2:03 AM   Specimen: Nasal Mucosa; Nasal  Swab  Result Value Ref Range Status   MRSA by PCR Next Gen DETECTED (A) NOT DETECTED Final    Comment: RESULT CALLED TO, READ BACK BY AND VERIFIED WITH: RN M TOLER AT 0408 04/24/2021 BY L BENFIELD (NOTE) The GeneXpert MRSA Assay (FDA approved for NASAL specimens only), is one component of a comprehensive MRSA colonization surveillance program. It is not intended to diagnose MRSA infection nor to guide or monitor treatment for MRSA infections. Test performance is not FDA approved in patients less than 88 years old. Performed at [facility], [address] [place], [place] [address]   Culture, Respiratory w Gram Stain     Status: None   Collection Time: 04/29/21  5:40 AM   Specimen: Tracheal Aspirate; Respiratory  Result Value Ref Range Status   Specimen Description TRACHEAL ASPIRATE  Final   Special Requests NONE  Final   Gram Stain   Final    ABUNDANT SQUAMOUS EPITHELIAL CELLS PRESENT ABUNDANT WBC PRESENT, PREDOMINANTLY PMN ABUNDANT GRAM POSITIVE RODS FEW GRAM POSITIVE COCCI FEW YEAST    Culture   Final    FEW Consistent with normal respiratory flora. NO STAPHYLOCOCCUS AUREUS ISOLATED Performed at [facility], [address], [place], [place] [address]    Report Status 05/01/2021 FINAL  Final  Culture, blood (routine x 2)     Status: None (Preliminary result)   Collection Time: 04/29/21  5:43 AM   Specimen: BLOOD RIGHT HAND  Result Value Ref Range Status   Specimen Description BLOOD RIGHT HAND  Final   Special Requests   Final    BOTTLES DRAWN AEROBIC AND ANAEROBIC Blood Culture adequate volume   Culture   Final    NO GROWTH 4 DAYS Performed at [facility], [address], [place], [place] [address]    Report Status PENDING  Incomplete  Culture, blood (routine x 2)     Status: None (Preliminary result)   Collection Time: 04/29/21  5:47 AM   Specimen: BLOOD LEFT HAND  Result Value Ref Range Status   Specimen Description BLOOD LEFT HAND  Final    Special Requests   Final    BOTTLES DRAWN AEROBIC AND ANAEROBIC Blood Culture adequate volume   Culture   Final    NO GROWTH 4 DAYS Performed at [facility], [address] [place], [place] [address]    Report Status PENDING  Incomplete    Radiology Studies: No results found.    Julann Mcgilvray T. Mounir Skipper [facility]  If 7PM-7AM, please contact night-coverage [email or web address] 05/03/2021, 1:30 PM

## 2021-05-03 NOTE — Progress Notes (Signed)
Attempted pt on a trial of ATC 28% with 5L of flow, pt feels like he cannot breathe on ATC asking several times to be placed back on vent. Vitals were stable, the only change was that SATs dropped from 95% down to 91% during trial. Pt placed back on PS/CPAP, pt stated he would be okay with trying again tomorrow.

## 2021-05-03 NOTE — TOC Progression Note (Signed)
Transition of Care [facility]) - Progression Note    Patient Details  Name: [facility] MRN: [id number] Date of Birth: Feb 20, 1951  [facility] CM/SW Contact  Milinda Antis, [place] Phone Number: 05/03/2021, 11:59 AM  Clinical Narrative:    11:36-  CSW contacted Prem at [facility] to inquire about their availability to accept the patient back at d/c.  CSW was informed that the facility can accept the patient back.  11:49-  CSW contacted the patient's wife to inquire about a decision as it relates to d/c planning to a vent SNF.  The spouse reports that she met with a physician yesterday and requested that the patient have an EMG completed.  The spouse reported that the physician will be consulting with a respiratory therapist to find a way to have a neurologist complete the EMG.  The spouse reports that she will make a decision about the SNF when she receives information from the doctors about the EMG.    CSW reiterated that there a limited amount of vent SNF's in [place] and informed the patient's spouse of their locations.        Expected Discharge Plan and Services                                                 Social Determinants of Health (SDOH) Interventions    Readmission Risk Interventions No flowsheet data found.

## 2021-05-04 DIAGNOSIS — K5903 Drug induced constipation: Secondary | ICD-10-CM | POA: Diagnosis not present

## 2021-05-04 DIAGNOSIS — K567 Ileus, unspecified: Secondary | ICD-10-CM | POA: Diagnosis not present

## 2021-05-04 DIAGNOSIS — Z9911 Dependence on respirator [ventilator] status: Secondary | ICD-10-CM | POA: Diagnosis not present

## 2021-05-04 DIAGNOSIS — R140 Abdominal distension (gaseous): Secondary | ICD-10-CM | POA: Diagnosis not present

## 2021-05-04 LAB — BASIC METABOLIC PANEL
Anion gap: 3 — ABNORMAL LOW (ref 5–15)
BUN: 17 mg/dL (ref 8–23)
CO2: 31 mmol/L (ref 22–32)
Calcium: 8.1 mg/dL — ABNORMAL LOW (ref 8.9–10.3)
Chloride: 104 mmol/L (ref 98–111)
Creatinine, Ser: <0.3 mg/dL — ABNORMAL LOW (ref 0.61–1.24)
Glucose, Bld: 101 mg/dL — ABNORMAL HIGH (ref 70–99)
Potassium: 4.2 mmol/L (ref 3.5–5.1)
Sodium: 138 mmol/L (ref 135–145)

## 2021-05-04 LAB — GLUCOSE, CAPILLARY
Glucose-Capillary: 108 mg/dL — ABNORMAL HIGH (ref 70–99)
Glucose-Capillary: 119 mg/dL — ABNORMAL HIGH (ref 70–99)
Glucose-Capillary: 111 mg/dL — ABNORMAL HIGH (ref 70–99)
Glucose-Capillary: 105 mg/dL — ABNORMAL HIGH (ref 70–99)
Glucose-Capillary: 112 mg/dL — ABNORMAL HIGH (ref 70–99)
Glucose-Capillary: 95 mg/dL (ref 70–99)

## 2021-05-04 LAB — CULTURE, BLOOD (ROUTINE X 2)
Culture: NO GROWTH
Culture: NO GROWTH
Special Requests: ADEQUATE
Special Requests: ADEQUATE

## 2021-05-04 MED ORDER — LISINOPRIL 10 MG PO TABS
5.0000 mg | ORAL_TABLET | Freq: Every day | ORAL | Status: DC
  Administered 2021-05-04 – 2021-05-21 (×18): 5 mg
  Filled 2021-05-04 (×18): qty 1

## 2021-05-04 NOTE — Progress Notes (Signed)
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 CT of the abdomen shows colonic distention, colonic ileus without obstruction or perforation. [facility].  GI consulted.  Colonic distention/ileus resolved with bowel regimen.  GI signed off.  Transferred to [facility] on 04/25/2021.  Hypotensive to 94/43 and febrile to 101.7 the morning of 7/8. Pancultured.  Started on meropenem and vancomycin.  Cultures NGTD.  Remained afebrile and hemodynamically stable.  Antibiotics discontinued 7/10.   Subjective: Seen and examined earlier this morning.  No major events overnight of this morning.  No complaints.  Objective: Vitals:   05/04/21 0800 05/04/21 0900 05/04/21 1000 05/04/21 1123  BP: (!) 144/49 (!) 177/56 (!) 167/59   Pulse: 60 67 77   Resp: 14 17 16    Temp:    98.1 F (36.7 C)  TempSrc:    Oral  SpO2: 98% 98% 97%   Weight:      Height:        Intake/Output Summary (Last 24 hours) at 05/04/2021 1126 Last data filed at 05/04/2021 1000 Gross per 24 hour  Intake 1920 ml  Output 3500 ml  Net -1580 ml   Filed Weights   05/02/21 0500 05/03/21 0500 05/04/21 0451  Weight: 96 kg 96.4 kg 88.8 kg    Examination:   GENERAL: No apparent distress.  Nontoxic. HEENT: MMM.  Vision and hearing grossly intact.  NECK: Tracheostomy. RESP: On pressure support via trach.  No IWOB.  Fair aeration bilaterally. CVS:  RRR. Heart sounds normal.  ABD/GI/GU: BS+. Abd soft, NTND.  Rectal tube and Foley in place.  Brown  loose stool in bag. MSK/EXT: Generalized weakness globally.  Trace edema. SKIN: no apparent skin lesion or wound NEURO: Awake and alert.  Seems to be fairly oriented.  No apparent focal neuro deficit other than generalized weakness. PSYCH: Calm. Normal affect.   Procedures:  None  Microbiology summarized: MRSA PCR screen positive.  Assessment & Plan: Colonic distention/ileus: has history of constipation and colonic dilation.  Likely opiate induced.  Seem to be on fentanyl patch and Dilaudid. Seems to have resolved with MoviPrep.  Abdominal exam reassuring.  Brown loose stool in rectal tube bag. -Continue Senokot-S every other day -MiraLAX twice daily as needed -Will take rectal tube out once stools are more formed. -GI signed off.  Fever: Isolated fever to 101.7 the night of 7/7-7/8.  Unclear source of fever.  Has no leukocytosis.  CXR and UA reassuring.  Blood cultures NGTD.  Respiratory culture with normal flora.  Low suspicion for C. difficile. -IV meropenem and vancomycin 7/8-7/10.  Afebrile and stable off antibiotics.  Chronic respiratory failure, tracheostomy, vent dependent -On mechanical vent per PCCM -Per patient's wife, [facility].  [facility]  I called and left a VM for his neurologist, Dr. Ermalinda MemosBhomick at [phone] on 7/11.  I called again on 7/12 and left a message to his front desk personnel who told me Dr. Ermalinda MemosBhomick won't be in office until 7/13.  I also encouraged patient's wife to reach out to them.   Hypokalemia and hypomagnesemia: Resolved. -Continue K-Dur 40 mEq daily   Hypoglycemia without diagnosis of diabetes.  Resolved. Hypoalbuminemia: Albumin 1.6. -Continue tube feed.   Anemia of chronic disease: Hgb dropped about 2 g.  No melena or hematochezia.  Hemoccult negative.  Anemia panel consistent with anemia of chronic disease.  Likely dilutional and anemia of critical illness. Recent Labs    12/24/20 2125  04/23/21 2020 04/26/21 0347 04/27/21 0716 04/29/21 0240 04/29/21 0800 04/30/21 0409 05/01/21 0932 05/02/21 0500  HGB 12.7* 10.7* 9.1* 9.6* 8.0* 8.0* 7.5* 8.2* 7.6*  -Transfuse for Hgb less than 7.0.  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  Chronic lower back pain/neuropathy/mood disorder/insomnia -Continue home Cymbalta and Ativan -Continue Lyrica, p.o. Dilaudid and fentanyl patch. -Scheduled Tylenol 960 mg 3 times daily per tube on 7/12  Nutrition Body mass index is 29.77 kg/m. Nutrition Problem: Inadequate oral intake Etiology: inability to eat Signs/Symptoms: NPO status Interventions: Tube feeding  DVT prophylaxis:  enoxaparin (LOVENOX) injection 40 mg Start: 04/30/21 1200 Place and maintain sequential compression device Start: 04/30/21 0733 SCDs Start: 04/24/21 0054  Code Status: Full code Family Communication: Patient and/or RN.  Updated patient's wife over the phone on 7/12 Level of care: ICU Status is: Inpatient  Remains inpatient appropriate because:Unsafe d/c plan  Dispo: The patient is from:  [facility]              Anticipated d/c is to:  To be determined              Patient currently is medically stable to d/c.   Difficult to place patient Yes       Consultants:  PCCM Gastroenterology-signed off   Sch Meds:  Scheduled Meds:  acetaminophen (TYLENOL) oral liquid 160 mg/5 mL  960 mg Per Tube Q8H   doxazosin  2 mg Per Tube Daily   DULoxetine  30 mg Oral BID   enoxaparin (LOVENOX) injection  40 mg Subcutaneous Q24H   feeding supplement (PROSource TF)  45 mL Per Tube TID   fentaNYL  1 patch Transdermal Q72H   free water  100 mL Per Tube Q4H   mouth rinse  15 mL Mouth Rinse 10 times per day   melatonin  3 mg Per Tube QHS   pantoprazole sodium  40 mg Per Tube Daily   potassium chloride  40 mEq Per Tube Daily   pregabalin  100 mg Per Tube TID   sennosides  10 mL Per Tube QODAY    sodium chloride flush  10-40 mL Intracatheter Q12H   Continuous Infusions:  sodium chloride Stopped (05/02/21 0501)   feeding supplement (VITAL 1.5 CAL) 1,000 mL (05/02/21 2143)   PRN Meds:.HYDROmorphone, hydrOXYzine, ipratropium-albuterol, LORazepam, ondansetron (ZOFRAN) IV, polyethylene glycol, sodium chloride flush  Antimicrobials: Anti-infectives (From admission, onward)    Start     Dose/Rate Route Frequency Ordered Stop   04/30/21 0330  vancomycin (VANCOREADY) IVPB 1500 mg/300 mL  Status:  Discontinued        1,500 mg 150 mL/hr over 120 Minutes Intravenous Every 24 hours 04/29/21 0237 05/01/21 0710   04/29/21 0330  vancomycin (VANCOREADY) IVPB 2000 mg/400 mL        2,000 mg 200 mL/hr over 120 Minutes Intravenous  Once 04/29/21 0237 04/29/21 0526   04/29/21 0330  meropenem (MERREM) 1 g in sodium chloride 0.9 % 100 mL IVPB  Status:  Discontinued  1 g 200 mL/hr over 30 Minutes Intravenous Every 8 hours 04/29/21 0237 05/01/21 0710        I have personally reviewed the following labs and images: CBC: Recent Labs  Lab 04/29/21 0240 04/29/21 0800 04/30/21 0409 05/01/21 0932 05/02/21 0500  WBC 5.4  --  5.0 5.6  --   HGB 8.0* 8.0* 7.5* 8.2* 7.6*  HCT 25.1* 24.7* 23.7* 25.9* 24.0*  MCV 93.3  --  93.3 93.8  --   PLT 181  --  169 211  --    BMP &GFR Recent Labs  Lab 04/28/21 0409 04/29/21 0240 04/30/21 0409 05/01/21 0547 05/02/21 0500 05/04/21 0455  NA 137 138 137  --  138 138  K 4.3 4.6 4.3  --  4.0 4.2  CL 111 112* 107  --  105 104  CO2 24 24 26   --  27 31  GLUCOSE 113* 133* 102*  --  119* 101*  BUN <5* <5* 10  --  14 17  CREATININE <0.30* <0.30* <0.30* <0.30* <0.30* <0.30*  CALCIUM 7.8* 7.6* 7.6*  --  8.1* 8.1*  MG 2.1 1.9 1.9  --  1.9  --   PHOS  --  3.8 3.2  --  2.7  --    CrCl cannot be calculated (This lab value cannot be used to calculate CrCl because it is not a number: <0.30). Liver & Pancreas: Recent Labs  Lab 04/29/21 0240 04/30/21 0409  05/02/21 0500  ALBUMIN 1.6* 1.6* 1.8*   No results for input(s): LIPASE, AMYLASE in the last 168 hours.  No results for input(s): AMMONIA in the last 168 hours. Diabetic: No results for input(s): HGBA1C in the last 72 hours. Recent Labs  Lab 05/03/21 1926 05/03/21 2336 05/04/21 0318 05/04/21 0741 05/04/21 1121  GLUCAP 116* 115* 108* 119* 111*   Cardiac Enzymes: Recent Labs  Lab 04/30/21 0409  CKTOTAL 29*   No results for input(s): PROBNP in the last 8760 hours. Coagulation Profile: No results for input(s): INR, PROTIME in the last 168 hours. Thyroid Function Tests: No results for input(s): TSH, T4TOTAL, FREET4, T3FREE, THYROIDAB in the last 72 hours. Lipid Profile: No results for input(s): CHOL, HDL, LDLCALC, TRIG, CHOLHDL, LDLDIRECT in the last 72 hours. Anemia Panel: No results for input(s): VITAMINB12, FOLATE, FERRITIN, TIBC, IRON, RETICCTPCT in the last 72 hours.  Urine analysis:    Component Value Date/Time   COLORURINE YELLOW 04/29/2021 0400   APPEARANCEUR HAZY (A) 04/29/2021 0400   LABSPEC 1.006 04/29/2021 0400   PHURINE 6.0 04/29/2021 0400   GLUCOSEU NEGATIVE 04/29/2021 0400   HGBUR NEGATIVE 04/29/2021 0400   BILIRUBINUR NEGATIVE 04/29/2021 0400   KETONESUR NEGATIVE 04/29/2021 0400   PROTEINUR NEGATIVE 04/29/2021 0400   NITRITE NEGATIVE 04/29/2021 0400   LEUKOCYTESUR NEGATIVE 04/29/2021 0400   Sepsis Labs: Invalid input(s): PROCALCITONIN, LACTICIDVEN  Microbiology: Recent Results (from the past 240 hour(s))  Culture, Respiratory w Gram Stain     Status: None   Collection Time: 04/29/21  5:40 AM   Specimen: Tracheal Aspirate; Respiratory  Result Value Ref Range Status   Specimen Description TRACHEAL ASPIRATE  Final   Special Requests NONE  Final   Gram Stain   Final    ABUNDANT SQUAMOUS EPITHELIAL CELLS PRESENT ABUNDANT WBC PRESENT, PREDOMINANTLY PMN ABUNDANT GRAM POSITIVE RODS FEW GRAM POSITIVE COCCI FEW YEAST    Culture   Final    FEW  Consistent with normal respiratory flora. NO STAPHYLOCOCCUS AUREUS ISOLATED Performed at [facility],  [address], [place], [place] [address]    Report Status 05/01/2021 FINAL  Final  Culture, blood (routine x 2)     Status: None   Collection Time: 04/29/21  5:43 AM   Specimen: BLOOD RIGHT HAND  Result Value Ref Range Status   Specimen Description BLOOD RIGHT HAND  Final   Special Requests   Final    BOTTLES DRAWN AEROBIC AND ANAEROBIC Blood Culture adequate volume   Culture   Final    NO GROWTH 5 DAYS Performed at [facility], [address] [place], [place] [address]    Report Status 05/04/2021 FINAL  Final  Culture, blood (routine x 2)     Status: None   Collection Time: 04/29/21  5:47 AM   Specimen: BLOOD LEFT HAND  Result Value Ref Range Status   Specimen Description BLOOD LEFT HAND  Final   Special Requests   Final    BOTTLES DRAWN AEROBIC AND ANAEROBIC Blood Culture adequate volume   Culture   Final    NO GROWTH 5 DAYS Performed at [facility], [address], [place], [place] [address]    Report Status 05/04/2021 FINAL  Final    Radiology Studies: No results found.    Zeyad Delaguila T. Phil Michels [facility]  If 7PM-7AM, please contact night-coverage [email or web address] 05/04/2021, 11:26 AM

## 2021-05-04 NOTE — Progress Notes (Signed)
Nutrition Follow-up  DOCUMENTATION CODES:   Not applicable  INTERVENTION:   TF via PEG: Vital 1.5 at 55 ml/h (1320 ml/d) Prosource TF 45 ml TID  Provides 2100 kcal, 122 gm protein, 1003 ml free water daily  Continue free water flushes 100 ml every 4 hours for total of 1603 ml per day. Recommend adjust free water flushes as needed based on sodium levels.   NUTRITION DIAGNOSIS:   Inadequate oral intake related to inability to eat as evidenced by NPO status.  Ongoing   GOAL:   Patient will meet greater than or equal to 90% of their needs  Progressing   MONITOR:   I & O's, TF tolerance  REASON FOR ASSESSMENT:   Ventilator    ASSESSMENT:   Pt admitted from [facility] with increasing abdominal distention and abdominal pain due to constipation and ileus. Pt with PMH of chronic pain, septic shock, chronic respiratory failure, recent ED visit for constipation, hx spinal surgeries, MSSA bacteriemia and pressure injury.  Discussed patient in ICU rounds and with RN today. Patient is tolerating TF at goal rate. Currently receiving Vital 1.5 at 55 ml/h via PEG with Prosource TF 45 ml TID.  Patient remains intubated on ventilator support via trach MV: 8.9 L/min Temp (24hrs), Avg:98.2 F (36.8 C), Min:97.8 F (36.6 C), Max:98.7 F (37.1 C)   Labs reviewed.  CBG: 108-119-111-105  Medications reviewed and include protonix, klor-con, Senokot.  Weight 88.8 kg today, down from 90.9 kg on admission 7/3. I/O -3.6 L since admission Rectal tube output 100 ml x 24 hours UOP 3150 ml x 24 hours  Diet Order:   Diet Order             Diet NPO time specified  Diet effective now                   EDUCATION NEEDS:   Not appropriate for education at this time  Skin:  Skin Assessment: Reviewed RN Assessment  Last BM:  7/13 rectal tube  Height:   Ht Readings from Last 1 Encounters:  04/23/21 5\' 8"  (1.727 m)    Weight:   Wt Readings from Last 1 Encounters:   05/04/21 88.8 kg    BMI:  Body mass index is 29.77 kg/m.  Estimated Nutritional Needs:   Kcal:  1900-2100  Protein:  100-125 grams  Fluid:  >1.9 L/day    05/06/21, RD, LDN, CNSC Please refer to Amion for contact information.

## 2021-05-05 DIAGNOSIS — Z9911 Dependence on respirator [ventilator] status: Secondary | ICD-10-CM | POA: Diagnosis not present

## 2021-05-05 DIAGNOSIS — G729 Myopathy, unspecified: Secondary | ICD-10-CM | POA: Diagnosis not present

## 2021-05-05 DIAGNOSIS — J986 Disorders of diaphragm: Secondary | ICD-10-CM

## 2021-05-05 DIAGNOSIS — R1312 Dysphagia, oropharyngeal phase: Secondary | ICD-10-CM

## 2021-05-05 DIAGNOSIS — R140 Abdominal distension (gaseous): Secondary | ICD-10-CM | POA: Diagnosis not present

## 2021-05-05 DIAGNOSIS — G8929 Other chronic pain: Secondary | ICD-10-CM

## 2021-05-05 DIAGNOSIS — J9611 Chronic respiratory failure with hypoxia: Secondary | ICD-10-CM | POA: Diagnosis not present

## 2021-05-05 DIAGNOSIS — M545 Low back pain, unspecified: Secondary | ICD-10-CM

## 2021-05-05 LAB — CBC
HCT: 25.3 % — ABNORMAL LOW (ref 39.0–52.0)
Hemoglobin: 7.9 g/dL — ABNORMAL LOW (ref 13.0–17.0)
MCH: 29.8 pg (ref 26.0–34.0)
MCHC: 31.2 g/dL (ref 30.0–36.0)
MCV: 95.5 fL (ref 80.0–100.0)
Platelets: 238 10*3/uL (ref 150–400)
RBC: 2.65 MIL/uL — ABNORMAL LOW (ref 4.22–5.81)
RDW: 14.6 % (ref 11.5–15.5)
WBC: 5.5 10*3/uL (ref 4.0–10.5)
nRBC: 0 % (ref 0.0–0.2)

## 2021-05-05 LAB — RENAL FUNCTION PANEL
Albumin: 2 g/dL — ABNORMAL LOW (ref 3.5–5.0)
Anion gap: 5 (ref 5–15)
BUN: 20 mg/dL (ref 8–23)
CO2: 26 mmol/L (ref 22–32)
Calcium: 8.2 mg/dL — ABNORMAL LOW (ref 8.9–10.3)
Chloride: 105 mmol/L (ref 98–111)
Creatinine, Ser: <0.3 mg/dL — ABNORMAL LOW (ref 0.61–1.24)
Glucose, Bld: 118 mg/dL — ABNORMAL HIGH (ref 70–99)
Phosphorus: 3.5 mg/dL (ref 2.5–4.6)
Potassium: 4.1 mmol/L (ref 3.5–5.1)
Sodium: 136 mmol/L (ref 135–145)

## 2021-05-05 LAB — GLUCOSE, CAPILLARY
Glucose-Capillary: 105 mg/dL — ABNORMAL HIGH (ref 70–99)
Glucose-Capillary: 106 mg/dL — ABNORMAL HIGH (ref 70–99)
Glucose-Capillary: 108 mg/dL — ABNORMAL HIGH (ref 70–99)
Glucose-Capillary: 119 mg/dL — ABNORMAL HIGH (ref 70–99)
Glucose-Capillary: 122 mg/dL — ABNORMAL HIGH (ref 70–99)
Glucose-Capillary: 99 mg/dL (ref 70–99)

## 2021-05-05 LAB — MAGNESIUM: Magnesium: 2.1 mg/dL (ref 1.7–2.4)

## 2021-05-05 MED ORDER — ACETAMINOPHEN 160 MG/5ML PO SOLN
960.0000 mg | Freq: Three times a day (TID) | ORAL | 0 refills | Status: DC
Start: 2021-05-05 — End: 2022-06-21

## 2021-05-05 MED ORDER — FENTANYL 25 MCG/HR TD PT72: 1.0000 | MEDICATED_PATCH | TRANSDERMAL | 0 refills | Status: DC

## 2021-05-05 MED ORDER — POTASSIUM CHLORIDE 40 MEQ/15ML (20%) PO SOLN
40.0000 meq | Freq: Every day | ORAL | Status: DC
Start: 2021-05-05 — End: 2021-06-09

## 2021-05-05 NOTE — Progress Notes (Signed)
Spoke with Boyd Kerbs RN who is aware of the DC central line order

## 2021-05-05 NOTE — Progress Notes (Signed)
NAME:  Andrew Rivera, MRN:  [id number], DOB:  [birth year], LOS: 11 ADMISSION DATE:  04/23/2021, CONSULTATION DATE:  04/24/21 REFERRING MD:  ED, CHIEF COMPLAINT:  abdominal pain   History of Present Illness:  70 yo male former smoker with hx of PNA and critical illness myopathy presented with abdominal distention and pain.  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   Pertinent  Medical History  Chronic pain, IJ thromboembolism, Septic shock, Pneumonia, Constipation with ileus, Critical illness myopathy, MSSA Bacteremia in November 2020, Pressure wounds  Significant Hospital Events: Including procedures, antibiotic start and stop dates in addition to other pertinent events   7/02 Admit 7/08 fever 101.7, start vancomycin/cefepime 7/12 trach collar trial did not last long  Interim History / Subjective:  No acute issues since last seen. Patients ileus has resolved and is now planned to go back to rehab facility.  Currently PSV 10/5 and tolerating well. Turned him down to 5/5 and he complained of feeling short of breath. Vitals remained within normal limits.   Objective   Blood pressure (!) 127/45, pulse 64, temperature 98.4 F (36.9 C), temperature source Oral, resp. rate 16, height 5\' 8"  (1.727 m), weight 90.3 kg, SpO2 97 %.    Vent Mode: PRVC FiO2 (%):  [28 %] 28 % Set Rate:  [16 bmp] 16 bmp Vt Set:  [550 mL] 550 mL PEEP:  [5 cmH20] 5 cmH20 Plateau Pressure:  [15 cmH20-17 cmH20] 16 cmH20   Intake/Output Summary (Last 24 hours) at 05/05/2021 0833 Last data filed at 05/05/2021 0600 Gross per 24 hour  Intake 995 ml  Output 2200 ml  Net -1205 ml   Filed Weights   05/03/21 0500 05/04/21 0451 05/05/21 0500  Weight: 96.4 kg 88.8 kg 90.3 kg    Examination: General:  elderly appearing male in NAD Neuro:  Alert, oriented, non-focal.  HEENT:  [place]/AT, No JVD noted, PERRL, trach in place - no  secretions Cardiovascular:  RRR, no MRG Lungs:  Clear bilateral breath sounds. Triggers breaths on vent.  Abdomen:  Soft, non-distended, non-tender. PEG site clean.  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 Tracheostomy and ventilator dependence. - goal SpO2 > 90% - routine trach care - He is tolerating PSV 10/5 this morning. We had long discussion about how to improve respiratory muscle strength. The major question is if he is able to liberate from the ventilator, which he has not been able to do over the past 2 years. I discussed with him in order to move towards this goal of liberating from the vent, that he needs to push him self for periods of time on PSV weaning in order to move towards trach collar trials. I have informed him that this is a long training process of building his strength back to be able to successfully stay off of the vent. I think best case scenario is that he is able to liberate from the vent during the day but use PAP at night.  - He should continue to work on PSV weaning when at rehab in order to move towards trach collar trials. Would encourage staff at rehab to help push patient to overcome his discomfort of dyspnea during these trials in  order to further build respiratory muscle strength. - EMG should be obtained as an outpatient via Neurology. Will work on figuring out how to get this down here in [place].   Colonic ileus Hypokalemia DM type 2 Anemia of critical illness and chronic disease Mood disorder with insomnia Chronic pain - per primary team  Labs:   CMP Latest Ref Rng & Units 05/05/2021 05/04/2021 05/02/2021  Glucose 70 - 99 mg/dL 326(Z) 124(P) 809(X)  BUN 8 - 23 mg/dL 20 17 14   Creatinine 0.61 - 1.24 mg/dL )  <8.33(A) <2.50(N)  Sodium 135 - 145 mmol/L 136 138 138  Potassium 3.5 - 5.1 mmol/L 4.1 4.2 4.0  Chloride 98 - 111 mmol/L 105 104 105  CO2 22 - 32 mmol/L 26 31 27   Calcium 8.9 - 10.3 mg/dL 8.2(L) 8.1(L) 8.1(L)  Total Protein 6.5 - 8.1 g/dL - - -  Total Bilirubin 0.3 - 1.2 mg/dL - - -  Alkaline Phos 38 - 126 U/L - - -  AST 15 - 41 U/L - - -  ALT 0 - 44 U/L - - -    CBC Latest Ref Rng & Units 05/05/2021 05/02/2021 05/01/2021  WBC 4.0 - 10.5 K/uL 5.5 - 5.6  Hemoglobin 13.0 - 17.0 g/dL 7.9(L) 7.6(L) 8.2(L)  Hematocrit 39.0 - 52.0 % 25.3(L) 24.0(L) 25.9(L)  Platelets 150 - 400 K/uL 238 - 211   CBG (last 3)  Recent Labs    05/04/21 2314 05/05/21 0313 05/05/21 0723  GLUCAP 95 108* 105*   Signature:    05/07/21, MD [facility] Office: [phone]   See Amion for personal pager PCCM on call pager [phone] until 7pm. Please call Elink 7p-7a. [phone]

## 2021-05-05 NOTE — Discharge Summary (Addendum)
Physician Discharge Summary  Andrew Rivera KGU:[id number] DOB: [birth year] DOA: 04/23/2021  PCP: Larena Glassman, MD  Admit date: 04/23/2021 Discharge date: 05/06/2021  Admitted From: [facility] Disposition: [facility]  Recommendations for Outpatient Follow-up:  Ambulatory referral to [facility] neurology, Dr. Allena Katz for EMG ordered. Pulmonology recommended periods of time on PSV weaning in order to move towards trach collar trials. 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 CT of the abdomen shows colonic distention, colonic ileus without obstruction or perforation. He was initially admitted to [facility] service as he vent dependent.  GI consulted.  Colonic distention/ileus resolved with bowel regimen.  GI signed off.  Transferred to [facility] on 04/25/2021.   Patient was hypotensive to 94/43 and febrile to 101.7 the morning of 7/8. Pancultured.  Started on meropenem and vancomycin.  Cultures NGTD.  Antibiotics discontinued 7/10. Remained afebrile and hemodynamically stable.  Patient is returning to [facility]  See individual problem list below for more hospital course.  Discharge Diagnoses:  Colonic distention/ileus: has history of constipation and colonic dilation.  Likely opiate induced.  Seem to be on fentanyl patch and Dilaudid. Seems to have resolved with MoviPrep.  Abdominal exam reassuring.  Brown loose stool in rectal tube bag. -Continue Senokot-S and MiraLAX. Adjust dose as appropriate. -GI signed off.   Fever: Isolated fever to 101.7 the night of 7/7-7/8.  Unclear source of fever.  Has no leukocytosis.  CXR and UA reassuring.   Blood cultures NGTD.  Respiratory culture with normal flora.  Low suspicion for C. difficile. -IV meropenem and vancomycin 7/8-7/10.  Afebrile and stable off antibiotics.   Chronic respiratory failure, tracheostomy, vent dependent -On mechanical vent per PCCM -Pulmonology recommended periods of time on PSV weaning in order to move towards trach collar trials. -Ambulatory referral to [facility] neurology, Dr. Allena Katz ordered for outpatient EMG   Hypokalemia and hypomagnesemia: Resolved. -Continue K-Dur 40 mEq daily   Hypoglycemia without diagnosis of diabetes.  Resolved. Hypoalbuminemia: Albumin 1.6. -Continue tube feed.   Anemia of chronic disease: Hgb dropped about 2 g.  No melena or hematochezia.  Hemoccult negative.  Anemia panel consistent with anemia of chronic disease.  Likely dilutional and anemia of critical illness. -Transfuse for Hgb less than 7.0.    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   Chronic lower back pain/neuropathy/mood disorder/insomnia -Continue home Cymbalta and Ativan -Continue gabapentin, p.o. Dilaudid and fentanyl patch. -Scheduled Tylenol 960 mg 3 times daily per tube on 7/12   Nutrition Body mass index is 30.27 kg/m. Nutrition Problem: Inadequate oral intake Etiology: inability to eat Signs/Symptoms: NPO status Interventions: Tube feeding  Discharge Exam: Vitals:   05/06/21 1050 05/06/21 1051  BP: (!) 119/55 (!) 119/55  Pulse:    Resp:    Temp:    SpO2:      GENERAL: No apparent distress.  Nontoxic. HEENT: MMM.  Vision and hearing grossly intact.  NECK: Tracheostomy. RESP: On pressure support 10/5 via trach.  No IWOB.  Fair aeration bilaterally. CVS:  RRR. Heart sounds normal.  ABD/GI/GU: Bowel sounds present. Soft. Non tender.  MSK/EXT: Generalized weakness globally.  Trace edema. SKIN: no apparent skin lesion or wound NEURO:  Awake and alert.  Follows commands.  Seems to be fairly oriented.  No apparent  focal neuro deficit other than generalized weakness PSYCH: Calm. Normal affect.   Discharge Instructions  Discharge Instructions     Ambulatory referral to Neurology   Complete by: As directed    An appointment is requested in approximately: 2-4 weeks for outpatient EMG      Allergies as of 05/06/2021       Reactions   Chlorhexidine Itching, Rash   Codeine Nausea Only   Oxycodone-acetaminophen Nausea Only   Other reaction(s): Hallucinations        Medication List     STOP taking these medications    acetaminophen 325 MG tablet Commonly known as: TYLENOL Replaced by: acetaminophen 160 MG/5ML solution   ERYTHROMYCIN LACTOBIONATE IV   fentaNYL 50 MCG/HR Commonly known as: DURAGESIC Replaced by: fentaNYL 25 MCG/HR   senna 8.6 MG Tabs tablet Commonly known as: SENOKOT Replaced by: sennosides 8.8 MG/5ML syrup       TAKE these medications    acetaminophen 160 MG/5ML solution Commonly known as: TYLENOL Place 30 mLs (960 mg total) into feeding tube every 8 (eight) hours. Replaces: acetaminophen 325 MG tablet   Carboxymethylcellulose Sodium 1 % Gel Place 1 drop into both eyes in the morning, at noon, and at bedtime.   chlorhexidine 0.12 % solution Commonly known as: PERIDEX Use as directed 5 mLs in the mouth or throat 2 (two) times daily.   Digestive Enzymes Tabs Take 1 tablet by mouth as needed (clogged tube).   doxazosin 2 MG tablet Commonly known as: CARDURA Place 2 mg into feeding tube daily.   DULoxetine 30 MG capsule Commonly known as: CYMBALTA Take 30 mg by mouth 2 (two) times daily.   feeding supplement (OSMOLITE 1.5 CAL) Liqd Place 30 mLs into feeding tube continuous. Goal rate is 1960ml/hour   feeding supplement (PRO-STAT SUGAR FREE 64) Liqd Place 30 mLs into feeding tube 2 (two) times daily.   fentaNYL 25 MCG/HR Commonly known as: DURAGESIC Place 1 patch onto the skin every 3 (three) days. Start taking on: May 07, 2021 Replaces: fentaNYL  50 MCG/HR   ferrous sulfate 325 (65 FE) MG tablet Take 325 mg by mouth See admin instructions. Every 2 days per g-tube   fiber Pack packet Place 1 packet into feeding tube every 12 (twelve) hours.   gabapentin 300 MG capsule Commonly known as: NEURONTIN Place 300 mg into feeding tube 3 (three) times daily.   glycopyrrolate 1 MG tablet Commonly known as: ROBINUL Place 1 mg into feeding tube 2 (two) times daily.   HYDROmorphone 2 MG tablet Commonly known as: DILAUDID Place 2 mg into feeding tube every 8 (eight) hours as needed for severe pain.   hydrOXYzine 25 MG tablet Commonly known as: ATARAX/VISTARIL Place 25 mg into feeding tube every 6 (six) hours as needed for anxiety.   ipratropium-albuterol 0.5-2.5 (3) MG/3ML Soln Commonly known as: DUONEB Take 3 mLs by nebulization every 4 (four) hours as needed (respiratory failure).   lactulose (encephalopathy) 10 GM/15ML Soln Commonly known as: CHRONULAC Place 20 g into feeding tube 2 (two) times daily as needed (constipation).   lansoprazole 30 MG capsule Commonly known as: PREVACID Place 30 mg into feeding tube daily at 12 noon.   levocetirizine 5 MG tablet Commonly known as: XYZAL Place 5 mg into feeding tube at bedtime as needed for allergies.   lisinopril 5 MG tablet Commonly known as: ZESTRIL Place 5 mg into feeding  tube daily.   LORazepam 0.5 MG tablet Commonly known as: ATIVAN Place 0.5 mg into feeding tube every 8 (eight) hours as needed for anxiety.   Melatonin 10 MG Tabs Give 10 mg by tube at bedtime.   polyethylene glycol 17 g packet Commonly known as: MIRALAX / GLYCOLAX Take 17 g by mouth 2 (two) times daily as needed for mild constipation. What changed:  how to take this when to take this reasons to take this   Potassium Chloride 40 MEQ/15ML (20%) Soln Give 40 mEq by tube daily. What changed: when to take this   Saline Flush 0.9 % Soln Inject 10 mLs into the vein in the morning and at bedtime.  Flush all unused lumens   sennosides 8.8 MG/5ML syrup Commonly known as: SENOKOT Place 10 mLs into feeding tube every other day. Start taking on: May 07, 2021 Replaces: senna 8.6 MG Tabs tablet        Consultations: PCCM Gastroenterology  Procedures/Studies CT ABDOMEN PELVIS W CONTRAST  Result Date: 04/23/2021 CLINICAL DATA:  Abdominal distension. EXAM: CT ABDOMEN AND PELVIS WITH CONTRAST TECHNIQUE: Multidetector CT imaging of the abdomen and pelvis was performed using the standard protocol following bolus administration of intravenous contrast. CONTRAST:  OMNIPAQUE IOHEXOL 300 MG/ML  SOLN COMPARISON:  Abdominal CT 12/24/2020 FINDINGS: Lower chest: Majority of the chest is included in the field of view. Tracheostomy tube above the carina. Right upper extremity PICC in the distal SVC. Small left pleural effusion is new from prior exam. Trace right pleural effusion. Increase in bibasilar atelectasis. Bilateral lower lobe air bronchograms. Hepatobiliary: Scattered small low-density lesions in the liver are similar to prior exam, majority too small to characterize but likely cysts. No calcified gallstone or pericholecystic inflammation. No biliary dilatation. Pancreas: Parenchymal atrophy. No ductal dilatation or inflammation. Spleen: Normal in size without focal abnormality. Adrenals/Urinary Tract: Normal adrenal glands. Homogeneous renal enhancement. No hydronephrosis. No visualized stone or focal lesion. Foley catheter decompresses the urinary bladder. Mild bladder wall thickening may be in part related to nondistention. Stomach/Bowel: Gastrostomy tube appropriately positioned in the stomach. Proximal small bowel loops are decompressed. Occasional fluid-filled loops of distal small bowel. There is diffuse colonic distension with air and fluid. Diffuse colonic redundancy. There is rectal wall thickening, series 3, image 82, but no other colonic wall thickening or inflammation. Previous colonic  diverticula are not well seen on the current exam. There is no bowel pneumatosis. Appendix not visualized, no appendicitis. Vascular/Lymphatic: Aortic atherosclerosis. No aortic aneurysm. Patent portal vein and mesenteric vessels. No bulky abdominopelvic adenopathy. Reproductive: Normal sized prostate gland. Other: No free air or ascites. Minimal fat in both inguinal canals. Asymmetric enlargement of the left lateral abdominal wall musculature, series 3, image 32. There is mild generalized subcutaneous edema. Prior right abdominal wall hernia repair with tacks. Musculoskeletal: Degenerative and postsurgical change throughout the spine. Bones are diffusely under mineralized. There is generalized fatty atrophy of the included musculature. IMPRESSION: 1. Diffuse colonic distension with air and fluid, suggesting colonic ileus. No evidence of obstruction or perforation. There is mild rectal wall thickening which may be related to proctitis. 2. Asymmetric enlargement of the left lateral abdominal wall musculature. This may represent asymmetric edema, however the possibility of lateral rectus sheath hematoma is raised. 3. Small left pleural effusion is new from prior exam. Trace right pleural effusion. Increase in bibasilar atelectasis/consolidation. 4. Foley catheter decompresses the urinary bladder, wall thickening may be related to nondistention. Aortic Atherosclerosis (ICD10-I70.0). Electronically Signed   By:  Narda Rutherford M.D.   On: 04/23/2021 23:24   DG CHEST PORT 1 VIEW  Result Date: 04/29/2021 CLINICAL DATA:  Sepsis EXAM: PORTABLE CHEST 1 VIEW COMPARISON:  04/23/2021 FINDINGS: Small layering right pleural effusion. Trace left pleural effusion. No frank interstitial edema. Heart is normal in size. Tracheostomy in satisfactory position. Right arm PICC terminates cavoatrial junction. IMPRESSION: Small layering right pleural effusion.  Trace left pleural effusion. Support apparatus as above. Electronically  Signed   By: Charline Bills M.D.   On: 04/29/2021 02:45   DG Chest Portable 1 View  Result Date: 04/23/2021 CLINICAL DATA:  PICC line verification. EXAM: PORTABLE CHEST 1 VIEW COMPARISON:  12/24/2020 FINDINGS: Interval placement of RIGHT-sided PICC line, tip overlying the level of the LOWER superior vena cava. Tracheostomy tube appears unchanged. Remote cervical-thoracic fusion. Stable elevation of RIGHT hemidiaphragm and gaseous distension of large bowel loops. There is atelectasis or chronic change at the LEFT lung base, associated with pleural effusion or pleural fluid. There is no pneumothorax. IMPRESSION: 1. Interval placement of PICC line, tip overlying the LOWER superior vena cava. 2. Persistent changes in the LEFT lung base consistent with infiltrate and pleural effusion or pleural thickening. Electronically Signed   By: Norva Pavlov M.D.   On: 04/23/2021 18:29       The results of significant diagnostics from this hospitalization (including imaging, microbiology, ancillary and laboratory) are listed below for reference.     Microbiology: Recent Results (from the past 240 hour(s))  Culture, Respiratory w Gram Stain     Status: None   Collection Time: 04/29/21  5:40 AM   Specimen: Tracheal Aspirate; Respiratory  Result Value Ref Range Status   Specimen Description TRACHEAL ASPIRATE  Final   Special Requests NONE  Final   Gram Stain   Final    ABUNDANT SQUAMOUS EPITHELIAL CELLS PRESENT ABUNDANT WBC PRESENT, PREDOMINANTLY PMN ABUNDANT GRAM POSITIVE RODS FEW GRAM POSITIVE COCCI FEW YEAST    Culture   Final    FEW Consistent with normal respiratory flora. NO STAPHYLOCOCCUS AUREUS ISOLATED Performed at [facility], [address] [place], [place] [address]    Report Status 05/01/2021 FINAL  Final  Culture, blood (routine x 2)     Status: None   Collection Time: 04/29/21  5:43 AM   Specimen: BLOOD RIGHT HAND  Result Value Ref Range Status   Specimen Description  BLOOD RIGHT HAND  Final   Special Requests   Final    BOTTLES DRAWN AEROBIC AND ANAEROBIC Blood Culture adequate volume   Culture   Final    NO GROWTH 5 DAYS Performed at [facility], [address], [place], [place] [address]    Report Status 05/04/2021 FINAL  Final  Culture, blood (routine x 2)     Status: None   Collection Time: 04/29/21  5:47 AM   Specimen: BLOOD LEFT HAND  Result Value Ref Range Status   Specimen Description BLOOD LEFT HAND  Final   Special Requests   Final    BOTTLES DRAWN AEROBIC AND ANAEROBIC Blood Culture adequate volume   Culture   Final    NO GROWTH 5 DAYS Performed at [facility], [address], [place], [place] [address]    Report Status 05/04/2021 FINAL  Final     Labs:  CBC: Recent Labs  Lab 04/30/21 0409 05/01/21 0932 05/02/21 0500 05/05/21 0405  WBC 5.0 5.6  --  5.5  HGB 7.5* 8.2* 7.6* 7.9*  HCT 23.7* 25.9* 24.0* 25.3*  MCV 93.3 93.8  --  95.5  PLT 169 211  --  238   BMP &GFR Recent Labs  Lab 04/30/21 0409 05/01/21 0547 05/02/21 0500 05/04/21 0455 05/05/21 0405  NA 137  --  138 138 136  K 4.3  --  4.0 4.2 4.1  CL 107  --  105 104 105  CO2 26  --  GLUCOSE 102*  --  119* 101* 118*  BUN 10  --  CREATININE <0.30* <0.30* <0.30* <0.30* <0.30*  CALCIUM 7.6*  --  8.1* 8.1* 8.2*  MG 1.9  --  1.9  --  2.1  PHOS 3.2  --  2.7  --  3.5   CrCl cannot be calculated (This lab value cannot be used to calculate CrCl because it is not a number: <0.30). Liver & Pancreas: Recent Labs  Lab 04/30/21 0409 05/02/21 0500 05/05/21 0405  ALBUMIN 1.6* 1.8* 2.0*   No results for input(s): LIPASE, AMYLASE in the last 168 hours. No results for input(s): AMMONIA in the last 168 hours. Diabetic: No results for input(s): HGBA1C in the last 72 hours. Recent Labs  Lab 05/05/21 1935 05/05/21 2335 05/06/21 0333 05/06/21 0715 05/06/21 1159  GLUCAP 106* 99 108* 141* 114*   Cardiac Enzymes: Recent Labs  Lab  04/30/21 0409  CKTOTAL 29*   No results for input(s): PROBNP in the last 8760 hours. Coagulation Profile: No results for input(s): INR, PROTIME in the last 168 hours. Thyroid Function Tests: No results for input(s): TSH, T4TOTAL, FREET4, T3FREE, THYROIDAB in the last 72 hours. Lipid Profile: No results for input(s): CHOL, HDL, LDLCALC, TRIG, CHOLHDL, LDLDIRECT in the last 72 hours. Anemia Panel: No results for input(s): VITAMINB12, FOLATE, FERRITIN, TIBC, IRON, RETICCTPCT in the last 72 hours. Urine analysis:    Component Value Date/Time   COLORURINE YELLOW 04/29/2021 0400   APPEARANCEUR HAZY (A) 04/29/2021 0400   LABSPEC 1.006 04/29/2021 0400   PHURINE 6.0 04/29/2021 0400   GLUCOSEU NEGATIVE 04/29/2021 0400   HGBUR NEGATIVE 04/29/2021 0400   BILIRUBINUR NEGATIVE 04/29/2021 0400   KETONESUR NEGATIVE 04/29/2021 0400   PROTEINUR NEGATIVE 04/29/2021 0400   NITRITE NEGATIVE 04/29/2021 0400   LEUKOCYTESUR NEGATIVE 04/29/2021 0400   Sepsis Labs: Invalid input(s): PROCALCITONIN, LACTICIDVEN   Time coordinating discharge: 45 minutes  SIGNED:  Almon Hercules, MD  [facility] 05/06/2021, 1:56 PM  If 7PM-7AM, please contact night-coverage [email or web address]

## 2021-05-05 NOTE — TOC Progression Note (Addendum)
Transition of Care [facility]) - Progression Note    Patient Details  Name: Andrew Rivera MRN: [id number] Date of Birth: 08/08/1951  [facility] of [facility] CM/SW Contact  Ralene Bathe, LCSWA Phone Number: 05/05/2021, 8:44 AM  Clinical Narrative:    08:45-  CSW contacted Prem with [facility] in an attempt to inform the facility that the patient is medically ready to d/c.  There was no  answer.  CSW left a VM with the above information requesting a returned call to receive d/c transfer instructions.    10:55-  CSW attempted to contact [facility] with [facility] to receive d/c transfer information.  There was no answer.  CSW left another VM.    10:59-  CSW contacted the facility directly [phone] and informed them of the d/c plans for the patient.  CSW was directed to [facility]'s VM.  CSW called again and asked to speak with another person in admission due to [facility] being unavailable.  CSW was directed to the VM for a person named [place].  There was no answer.  CSW left a VM requesting a returned call due to the patient being medically ready to d/c today.  11:23-  CSW received a VM from Prem with [facility] informing CSW that the facility does not have any beds available at this time and to contact the facility "later in the week".  CSW attempted to call back to inquire about what day the facility expects to have a bed available and there was no answer.  CSW left a VM requesting a returned call.    11:41-  TC from [facility] with [facility]-  The facility is full at this time, but is still willing to accept the patient when a bed is available.  CSW encouraged to call back on Monday to check for bed availability.         Expected Discharge Plan and Services           Expected Discharge Date: 05/05/21                                     Social Determinants of Health (SDOH) Interventions    Readmission Risk Interventions No flowsheet data found.

## 2021-05-06 ENCOUNTER — Encounter: Payer: Self-pay | Admitting: Neurology

## 2021-05-06 DIAGNOSIS — K5903 Drug induced constipation: Secondary | ICD-10-CM | POA: Diagnosis not present

## 2021-05-06 DIAGNOSIS — Z9911 Dependence on respirator [ventilator] status: Secondary | ICD-10-CM | POA: Diagnosis not present

## 2021-05-06 DIAGNOSIS — R140 Abdominal distension (gaseous): Secondary | ICD-10-CM | POA: Diagnosis not present

## 2021-05-06 DIAGNOSIS — K567 Ileus, unspecified: Secondary | ICD-10-CM | POA: Diagnosis not present

## 2021-05-06 LAB — GLUCOSE, CAPILLARY
Glucose-Capillary: 108 mg/dL — ABNORMAL HIGH (ref 70–99)
Glucose-Capillary: 141 mg/dL — ABNORMAL HIGH (ref 70–99)
Glucose-Capillary: 114 mg/dL — ABNORMAL HIGH (ref 70–99)
Glucose-Capillary: 101 mg/dL — ABNORMAL HIGH (ref 70–99)
Glucose-Capillary: 101 mg/dL — ABNORMAL HIGH (ref 70–99)

## 2021-05-06 MED ORDER — POLYETHYLENE GLYCOL 3350 17 G PO PACK: 17.0000 g | PACK | Freq: Two times a day (BID) | ORAL | 0 refills | Status: DC | PRN

## 2021-05-06 MED ORDER — SENNOSIDES 8.8 MG/5ML PO SYRP: 10.0000 mL | ORAL_SOLUTION | ORAL | 0 refills | Status: DC

## 2021-05-06 MED ORDER — GLYCOPYRROLATE 1 MG PO TABS
1.0000 mg | ORAL_TABLET | Freq: Two times a day (BID) | ORAL | Status: AC
  Administered 2021-05-06 – 2021-05-08 (×4): 1 mg
  Filled 2021-05-06 (×4): qty 1

## 2021-05-06 NOTE — Progress Notes (Signed)
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 CT of the abdomen shows colonic distention, colonic ileus without obstruction or perforation. He was initially admitted to [facility] service as he vent dependent.  GI consulted.  Colonic distention/ileus resolved with bowel regimen.  GI signed off.  Transferred to [facility] on 04/25/2021.   Patient was hypotensive to 94/43 and febrile to 101.7 the morning of 7/8. Pancultured.  Started on meropenem and vancomycin.  Cultures NGTD.  Antibiotics discontinued 7/10. Remained afebrile and hemodynamically stable.  Patient is returning to [facility].  Ambulatory referral to lumbar neurology for outpatient EMG ordered.   See individual problem list below for more hospital course.    Subjective: Seen and examined earlier this morning.  No major events overnight of this morning.  No complaints.  Pain fairly controlled.  Objective: Vitals:   05/06/21 0828 05/06/21 0857 05/06/21 1050 05/06/21 1051  BP:   (!) 119/55 (!) 119/55  Pulse: 65     Resp: 16     Temp:      TempSrc:      SpO2: 95% 95%    Weight:      Height:        Intake/Output Summary (Last 24 hours) at 05/06/2021 1354 Last data filed at 05/06/2021 1330 Gross per 24 hour  Intake 230 ml  Output 2140 ml  Net -1910 ml   Filed Weights   05/04/21 0451 05/05/21 0500 05/06/21 0355  Weight: 88.8 kg 90.3 kg 90.3 kg    Examination:   GENERAL: No apparent distress.  Nontoxic. HEENT: MMM.  Vision and hearing grossly intact.  NECK: Tracheostomy. RESP: On  pressure support via trach.  No IWOB.  Fair aeration bilaterally. CVS:  RRR. Heart sounds normal.  ABD/GI/GU: BS+. Abd soft, NTND.  Rectal tube and Foley in place.  Brown loose stool in bag. MSK/EXT: Generalized weakness globally.  Trace edema. SKIN: no apparent skin lesion or wound NEURO: Awake and alert.  Seems to be fairly oriented.  No apparent focal neuro deficit other than generalized weakness. PSYCH: Calm. Normal affect.   Procedures:  None  Microbiology summarized: MRSA PCR screen positive.  Assessment & Plan: Colonic distention/ileus: has history of constipation and colonic dilation.  Likely opiate induced.  Seem to be on fentanyl patch and Dilaudid. Seems to have resolved with MoviPrep.  Abdominal exam reassuring.  Brown loose stool in rectal tube bag. -Continue Senokot-S every other day -MiraLAX twice daily as needed -Will take rectal tube out once stools are more formed. -GI signed off.  Fever: Isolated fever to 101.7 the night of 7/7-7/8.  Unclear source of fever.  Has no leukocytosis.  CXR and UA reassuring.  Blood cultures NGTD.  Respiratory culture with normal flora.  Low suspicion for C. difficile. -IV meropenem and vancomycin 7/8-7/10.  Afebrile and stable off antibiotics.  Chronic respiratory failure, tracheostomy, vent dependent -PCCM following intermittently. -PCCM recs-periods of time on PSV weaning in order to move towards trach collar trials. -Ambulatory referral to local neurology, Dr. Allena Katz for EMG ordered   Hypokalemia and hypomagnesemia:  Resolved. -Continue K-Dur 40 mEq daily   Hypoglycemia without diagnosis of diabetes.  Resolved. Hypoalbuminemia: Albumin 1.6. -Continue tube feed.   Anemia of chronic disease: Hgb dropped about 2 g.  No melena or hematochezia.  Hemoccult negative.  Anemia panel consistent with anemia of chronic disease.  Likely dilutional and anemia of critical illness. Recent Labs    12/24/20 2125 04/23/21 2020 04/26/21 0347  04/27/21 0716 04/29/21 0240 04/29/21 0800 04/30/21 0409 05/01/21 0932 05/02/21 0500 05/05/21 0405  HGB 12.7* 10.7* 9.1* 9.6* 8.0* 8.0* 7.5* 8.2* 7.6* 7.9*  -Transfuse for Hgb less than 7.0.  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  Chronic lower back pain/neuropathy/mood disorder/insomnia -Continue home Cymbalta and Ativan -Continue Lyrica, p.o. Dilaudid and fentanyl patch. -Scheduled Tylenol 960 mg 3 times daily per tube on 7/12  Nutrition Body mass index is 30.27 kg/m. Nutrition Problem: Inadequate oral intake Etiology: inability to eat Signs/Symptoms: NPO status Interventions: Tube feeding  DVT prophylaxis:  enoxaparin (LOVENOX) injection 40 mg Start: 04/30/21 1200 Place and maintain sequential compression device Start: 04/30/21 0733 SCDs Start: 04/24/21 0054  Code Status: Full code Family Communication: Patient and/or RN.  No family member at bedside. Level of care: ICU Status is: Inpatient  Remains inpatient appropriate because:Unsafe d/c plan  Dispo: The [facility] is [facility]:  [facility]              Anticipated d/c is to:  [facility]              Patient currently is medically stable to d/c.   Difficult to place patient Yes       Consultants:  PCCM Gastroenterology-signed off   Sch Meds:  Scheduled Meds:  acetaminophen (TYLENOL) oral liquid 160 mg/5 mL  960 mg Per Tube Q8H   doxazosin  2 mg Per Tube Daily   DULoxetine  30 mg Oral BID   enoxaparin (LOVENOX) injection  40 mg Subcutaneous Q24H   feeding supplement (PROSource TF)  45 mL Per Tube TID   fentaNYL  1 patch Transdermal Q72H   free water  100 mL Per Tube Q4H   lisinopril  5 mg Per Tube Daily   mouth rinse  15 mL Mouth Rinse 10 times per day   melatonin  3 mg Per Tube QHS   pantoprazole sodium  40 mg Per Tube Daily   potassium chloride  40 mEq Per Tube Daily   pregabalin  100 mg Per Tube TID   sennosides  10 mL Per Tube QODAY    sodium chloride flush  10-40 mL Intracatheter Q12H   Continuous Infusions:  sodium chloride Stopped (05/02/21 0501)   feeding supplement (VITAL 1.5 CAL) 1,000 mL (05/06/21 16100632)   PRN Meds:.HYDROmorphone, hydrOXYzine, ipratropium-albuterol, LORazepam, ondansetron (ZOFRAN) IV, polyethylene glycol, sodium chloride flush  Antimicrobials: Anti-infectives (From admission, onward)    Start     Dose/Rate Route Frequency Ordered Stop   04/30/21 0330  vancomycin (VANCOREADY) IVPB 1500 mg/300 mL  Status:  Discontinued        1,500 mg 150 mL/hr over 120 Minutes Intravenous Every 24 hours 04/29/21 0237 05/01/21 0710   04/29/21 0330  vancomycin (VANCOREADY) IVPB 2000 mg/400 mL        2,000 mg 200 mL/hr over 120 Minutes Intravenous  Once 04/29/21 0237 04/29/21 0526   04/29/21 0330  meropenem (MERREM) 1 g in sodium chloride 0.9 % 100 mL IVPB  Status:  Discontinued        1 g  200 mL/hr over 30 Minutes Intravenous Every 8 hours 04/29/21 0237 05/01/21 0710        I have personally reviewed the following labs and images: CBC: Recent Labs  Lab 04/30/21 0409 05/01/21 0932 05/02/21 0500 05/05/21 0405  WBC 5.0 5.6  --  5.5  HGB 7.5* 8.2* 7.6* 7.9*  HCT 23.7* 25.9* 24.0* 25.3*  MCV 93.3 93.8  --  95.5  PLT 169 211  --  238   BMP &GFR Recent Labs  Lab 04/30/21 0409 05/01/21 0547 05/02/21 0500 05/04/21 0455 05/05/21 0405  NA 137  --  138 138 136  K 4.3  --  4.0 4.2 4.1  CL 107  --  105 104 105  CO2 26  --  27 31 26   GLUCOSE 102*  --  119* 101* 118*  BUN 10  --  14 17 20   CREATININE <0.30* <0.30* <0.30* <0.30* <0.30*  CALCIUM 7.6*  --  8.1* 8.1* 8.2*  MG 1.9  --  1.9  --  2.1  PHOS 3.2  --  2.7  --  3.5   CrCl cannot be calculated (This lab value cannot be used to calculate CrCl because it is not a number: <0.30). Liver & Pancreas: Recent Labs  Lab 04/30/21 0409 05/02/21 0500 05/05/21 0405  ALBUMIN 1.6* 1.8* 2.0*   No results for input(s): LIPASE, AMYLASE in the last 168  hours.  No results for input(s): AMMONIA in the last 168 hours. Diabetic: No results for input(s): HGBA1C in the last 72 hours. Recent Labs  Lab 05/05/21 1935 05/05/21 2335 05/06/21 0333 05/06/21 0715 05/06/21 1159  GLUCAP 106* 99 108* 141* 114*   Cardiac Enzymes: Recent Labs  Lab 04/30/21 0409  CKTOTAL 29*   No results for input(s): PROBNP in the last 8760 hours. Coagulation Profile: No results for input(s): INR, PROTIME in the last 168 hours. Thyroid Function Tests: No results for input(s): TSH, T4TOTAL, FREET4, T3FREE, THYROIDAB in the last 72 hours. Lipid Profile: No results for input(s): CHOL, HDL, LDLCALC, TRIG, CHOLHDL, LDLDIRECT in the last 72 hours. Anemia Panel: No results for input(s): VITAMINB12, FOLATE, FERRITIN, TIBC, IRON, RETICCTPCT in the last 72 hours.  Urine analysis:    Component Value Date/Time   COLORURINE YELLOW 04/29/2021 0400   APPEARANCEUR HAZY (A) 04/29/2021 0400   LABSPEC 1.006 04/29/2021 0400   PHURINE 6.0 04/29/2021 0400   GLUCOSEU NEGATIVE 04/29/2021 0400   HGBUR NEGATIVE 04/29/2021 0400   BILIRUBINUR NEGATIVE 04/29/2021 0400   KETONESUR NEGATIVE 04/29/2021 0400   PROTEINUR NEGATIVE 04/29/2021 0400   NITRITE NEGATIVE 04/29/2021 0400   LEUKOCYTESUR NEGATIVE 04/29/2021 0400   Sepsis Labs: Invalid input(s): PROCALCITONIN, LACTICIDVEN  Microbiology: Recent Results (from the past 240 hour(s))  Culture, Respiratory w Gram Stain     Status: None   Collection Time: 04/29/21  5:40 AM   Specimen: Tracheal Aspirate; Respiratory  Result Value Ref Range Status   Specimen Description TRACHEAL ASPIRATE  Final   Special Requests NONE  Final   Gram Stain   Final    ABUNDANT SQUAMOUS EPITHELIAL CELLS PRESENT ABUNDANT WBC PRESENT, PREDOMINANTLY PMN ABUNDANT GRAM POSITIVE RODS FEW GRAM POSITIVE COCCI FEW YEAST    Culture   Final    FEW Consistent with normal respiratory flora. NO STAPHYLOCOCCUS AUREUS ISOLATED Performed at [facility], [address] [place], [address] [place]    Report Status 05/01/2021 FINAL  Final  Culture, blood (routine x 2)     Status: None  Collection Time: 04/29/21  5:43 AM   Specimen: BLOOD RIGHT HAND  Result Value Ref Range Status   Specimen Description BLOOD RIGHT HAND  Final   Special Requests   Final    BOTTLES DRAWN AEROBIC AND ANAEROBIC Blood Culture adequate volume   Culture   Final    NO GROWTH 5 DAYS Performed at [facility], [address], [place], [place] [address]    Report Status 05/04/2021 FINAL  Final  Culture, blood (routine x 2)     Status: None   Collection Time: 04/29/21  5:47 AM   Specimen: BLOOD LEFT HAND  Result Value Ref Range Status   Specimen Description BLOOD LEFT HAND  Final   Special Requests   Final    BOTTLES DRAWN AEROBIC AND ANAEROBIC Blood Culture adequate volume   Culture   Final    NO GROWTH 5 DAYS Performed at [facility], [address], [place], [place] [address]    Report Status 05/04/2021 FINAL  Final    Radiology Studies: No results found.    Ashlynn Gunnels T. Lutie Pickler [facility]  If 7PM-7AM, please contact night-coverage [email or web address] 05/06/2021, 1:54 PM

## 2021-05-06 NOTE — Progress Notes (Signed)
eLink Physician-Brief Progress Note Patient Name: Andrew Rivera DOB: 1951/04/11 MRN: [id number]   Date of Service  05/06/2021  HPI/Events of Note  Patient with copious oral secretions which he finds uncomfortable, he's asking for a resumption of Glycopyrrolate down his feeding tube which he was getting at [facility].  eICU Interventions  PRN glycopyrrolate tablets ordered for excessive secretions.        Thomasene Lot Tanvir Hipple 05/06/2021, 8:55 PM

## 2021-05-07 DIAGNOSIS — K5900 Constipation, unspecified: Secondary | ICD-10-CM | POA: Diagnosis not present

## 2021-05-07 DIAGNOSIS — J9611 Chronic respiratory failure with hypoxia: Secondary | ICD-10-CM | POA: Diagnosis not present

## 2021-05-07 LAB — MAGNESIUM: Magnesium: 2.1 mg/dL (ref 1.7–2.4)

## 2021-05-07 LAB — HEMOGLOBIN AND HEMATOCRIT, BLOOD
HCT: 26.8 % — ABNORMAL LOW (ref 39.0–52.0)
Hemoglobin: 8.9 g/dL — ABNORMAL LOW (ref 13.0–17.0)

## 2021-05-07 LAB — GLUCOSE, CAPILLARY
Glucose-Capillary: 101 mg/dL — ABNORMAL HIGH (ref 70–99)
Glucose-Capillary: 103 mg/dL — ABNORMAL HIGH (ref 70–99)
Glucose-Capillary: 103 mg/dL — ABNORMAL HIGH (ref 70–99)
Glucose-Capillary: 106 mg/dL — ABNORMAL HIGH (ref 70–99)

## 2021-05-07 LAB — RENAL FUNCTION PANEL
Albumin: 2.2 g/dL — ABNORMAL LOW (ref 3.5–5.0)
Anion gap: 3 — ABNORMAL LOW (ref 5–15)
BUN: 24 mg/dL — ABNORMAL HIGH (ref 8–23)
CO2: 26 mmol/L (ref 22–32)
Calcium: 8.5 mg/dL — ABNORMAL LOW (ref 8.9–10.3)
Chloride: 108 mmol/L (ref 98–111)
Creatinine, Ser: <0.3 mg/dL — ABNORMAL LOW (ref 0.61–1.24)
Glucose, Bld: 129 mg/dL — ABNORMAL HIGH (ref 70–99)
Phosphorus: 4.2 mg/dL (ref 2.5–4.6)
Potassium: 4.4 mmol/L (ref 3.5–5.1)
Sodium: 137 mmol/L (ref 135–145)

## 2021-05-07 NOTE — Progress Notes (Signed)
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 CT of the abdomen shows colonic distention, colonic ileus without obstruction or perforation. He was initially admitted to [facility] service as he vent dependent.  GI consulted.  Colonic distention/ileus resolved with bowel regimen.  GI signed off.  Transferred to [facility] on 04/25/2021.   Patient was hypotensive to 94/43 and febrile to 101.7 the morning of 7/8. Pancultured.  Started on meropenem and vancomycin.  Cultures NGTD.  Antibiotics discontinued 7/10. Remained afebrile and hemodynamically stable.  Patient is returning to [facility].  Ambulatory referral to lumbar neurology for outpatient EMG ordered.  Assessment & Plan:   Active Problems:   Ileus (HCC)   Abdominal distension   Constipation   Hypokalemia   Chronic respiratory failure with hypoxia (HCC)  Colonic distention/ileus: has history of constipation and colonic dilation.  Likely opiate induced.  Seem to be on fentanyl patch and Dilaudid. Seems to have resolved with MoviPrep.  Abdominal exam reassuring.  Brown loose stool in rectal tube bag. -Continue Senokot-S every other day -MiraLAX twice daily as needed -GI has since signed off.   Fever: Isolated fever to 101.7 the night of 7/7-7/8.  Unclear source of fever.  Has no leukocytosis.  CXR and UA reassuring.  Blood cultures NGTD.  Respiratory culture with normal flora.  Low suspicion for C. difficile. -IV meropenem and vancomycin 7/8-7/10.  Remains stable off antibiotics.   Chronic respiratory failure, tracheostomy, vent dependent -PCCM following intermittently. -PCCM recs-periods of time on PSV weaning in order to move towards trach  collar trials. -Ambulatory referral to local neurology, Dr. Allena Katz for EMG ordered   Hypokalemia and hypomagnesemia: Resolved. -Continue K-Dur 40 mEq daily   Hypoglycemia without diagnosis of diabetes.  Resolved. Hypoalbuminemia: Albumin 1.6. -Continue tube feed.   Anemia of chronic disease: Hgb dropped about 2 g.  No melena or hematochezia.  Hemoccult negative.  Anemia panel consistent with anemia of chronic disease.  Likely dilutional and anemia of critical illness. 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   Chronic lower back pain/neuropathy/mood disorder/insomnia -Continue home Cymbalta and Ativan -Continue Lyrica, p.o. Dilaudid and fentanyl patch. -Scheduled Tylenol 960 mg 3 times daily per tube on 7/12   Nutrition Body mass index is 30.27 kg/m. Nutrition Problem: Inadequate oral intake Etiology: inability to eat Signs/Symptoms: NPO status Interventions: Tube feeding   DVT prophylaxis: SCD's Code Status: Full Family Communication: Pt in room, family at bedside  Status is: Inpatient  Remains inpatient appropriate because:Inpatient level of care appropriate due to severity of illness  Dispo: The [facility] is [facility]              Anticipated d[facility]c is to: [facility]              Patient currently is not medically stable to d/c.   Difficult to place patient No       Consultants:  GI PCCM  Procedures:    Antimicrobials: Anti-infectives (From admission, onward)    Start     Dose/Rate Route Frequency Ordered Stop   04/30/21 0330  vancomycin (VANCOREADY) IVPB 1500 mg/300 mL  Status:  Discontinued  1,500 mg 150 mL/hr over 120 Minutes Intravenous Every 24 hours 04/29/21 0237 05/01/21 0710   04/29/21 0330  vancomycin (VANCOREADY) IVPB 2000 mg/400 mL        2,000 mg 200 mL/hr over 120 Minutes Intravenous  Once 04/29/21 0237 04/29/21 0526   04/29/21 0330  meropenem (MERREM) 1 g in sodium chloride  0.9 % 100 mL IVPB  Status:  Discontinued        1 g 200 mL/hr over 30 Minutes Intravenous Every 8 hours 04/29/21 0237 05/01/21 0710       Subjective: Eager to return to facility  Objective: Vitals:   05/07/21 1200 05/07/21 1205 05/07/21 1300 05/07/21 1400  BP: (!) 158/56  (!) 209/66 (!) 143/61  Pulse: 65  100 86  Resp: 14  20 19   Temp:      TempSrc:      SpO2: 98% 98% 95% 95%  Weight:      Height:        Intake/Output Summary (Last 24 hours) at 05/07/2021 1440 Last data filed at 05/07/2021 0800 Gross per 24 hour  Intake 395 ml  Output 1187 ml  Net -792 ml   Filed Weights   05/05/21 0500 05/06/21 0355 05/07/21 0500  Weight: 90.3 kg 90.3 kg 90.8 kg    Examination: General exam: Awake, laying in bed, in nad Respiratory system: Normal respiratory effort, no wheezing, trach in place Cardiovascular system: regular rate, s1, s2 Gastrointestinal system: Soft, nondistended, positive BS Central nervous system: CN2-12 grossly intact, strength intact Extremities: Perfused, no clubbing Skin: Normal skin turgor, no notable skin lesions seen Psychiatry: Mood normal // no visual hallucinations   Data Reviewed: I have personally reviewed following labs and imaging studies  CBC: Recent Labs  Lab 05/01/21 0932 05/02/21 0500 05/05/21 0405 05/07/21 0544  WBC 5.6  --  5.5  --   HGB 8.2* 7.6* 7.9* 8.9*  HCT 25.9* 24.0* 25.3* 26.8*  MCV 93.8  --  95.5  --   PLT 211  --  238  --    Basic Metabolic Panel: Recent Labs  Lab 05/01/21 0547 05/02/21 0500 05/04/21 0455 05/05/21 0405 05/07/21 0544  NA  --  138 138 136 137  K  --  4.0 4.2 4.1 4.4  CL  --  105 104 105 108  CO2  --  27 31 26 26   GLUCOSE  --  119* 101* 118* 129*  BUN  --  14 17 20  24*  CREATININE <0.30* <0.30* <0.30* <0.30* <0.30*  CALCIUM  --  8.1* 8.1* 8.2* 8.5*  MG  --  1.9  --  2.1 2.1  PHOS  --  2.7  --  3.5 4.2   GFR: CrCl cannot be calculated (This lab value cannot be used to calculate CrCl because it  is not a number: <0.30). Liver Function Tests: Recent Labs  Lab 05/02/21 0500 05/05/21 0405 05/07/21 0544  ALBUMIN 1.8* 2.0* 2.2*   No results for input(s): LIPASE, AMYLASE in the last 168 hours. No results for input(s): AMMONIA in the last 168 hours. Coagulation Profile: No results for input(s): INR, PROTIME in the last 168 hours. Cardiac Enzymes: No results for input(s): CKTOTAL, CKMB, CKMBINDEX, TROPONINI in the last 168 hours. BNP (last 3 results) No results for input(s): PROBNP in the last 8760 hours. HbA1C: No results for input(s): HGBA1C in the last 72 hours. CBG: Recent Labs  Lab 05/06/21 2001 05/07/21 0012 05/07/21 0332 05/07/21 0718 05/07/21 1202  GLUCAP 101* 103* 103*  106* 101*   Lipid Profile: No results for input(s): CHOL, HDL, LDLCALC, TRIG, CHOLHDL, LDLDIRECT in the last 72 hours. Thyroid Function Tests: No results for input(s): TSH, T4TOTAL, FREET4, T3FREE, THYROIDAB in the last 72 hours. Anemia Panel: No results for input(s): VITAMINB12, FOLATE, FERRITIN, TIBC, IRON, RETICCTPCT in the last 72 hours. Sepsis Labs: No results for input(s): PROCALCITON, LATICACIDVEN in the last 168 hours.  Recent Results (from the past 240 hour(s))  Culture, Respiratory w Gram Stain     Status: None   Collection Time: 04/29/21  5:40 AM   Specimen: Tracheal Aspirate; Respiratory  Result Value Ref Range Status   Specimen Description TRACHEAL ASPIRATE  Final   Special Requests NONE  Final   Gram Stain   Final    ABUNDANT SQUAMOUS EPITHELIAL CELLS PRESENT ABUNDANT WBC PRESENT, PREDOMINANTLY PMN ABUNDANT GRAM POSITIVE RODS FEW GRAM POSITIVE COCCI FEW YEAST    Culture   Final    FEW Consistent with normal respiratory flora. NO STAPHYLOCOCCUS AUREUS ISOLATED Performed at [facility], [address], [place], [place] [address]    Report Status 05/01/2021 FINAL  Final  Culture, blood (routine x 2)     Status: None   Collection Time: 04/29/21  5:43 AM   Specimen:  BLOOD RIGHT HAND  Result Value Ref Range Status   Specimen Description BLOOD RIGHT HAND  Final   Special Requests   Final    BOTTLES DRAWN AEROBIC AND ANAEROBIC Blood Culture adequate volume   Culture   Final    NO GROWTH 5 DAYS Performed at [facility], [address] [place], [place] [address]    Report Status 05/04/2021 FINAL  Final  Culture, blood (routine x 2)     Status: None   Collection Time: 04/29/21  5:47 AM   Specimen: BLOOD LEFT HAND  Result Value Ref Range Status   Specimen Description BLOOD LEFT HAND  Final   Special Requests   Final    BOTTLES DRAWN AEROBIC AND ANAEROBIC Blood Culture adequate volume   Culture   Final    NO GROWTH 5 DAYS Performed at [facility], [address] [place], [place] [address]    Report Status 05/04/2021 FINAL  Final     Radiology Studies: No results found.  Scheduled Meds:  acetaminophen (TYLENOL) oral liquid 160 mg/5 mL  960 mg Per Tube Q8H   doxazosin  2 mg Per Tube Daily   DULoxetine  30 mg Oral BID   enoxaparin (LOVENOX) injection  40 mg Subcutaneous Q24H   feeding supplement (PROSource TF)  45 mL Per Tube TID   fentaNYL  1 patch Transdermal Q72H   free water  100 mL Per Tube Q4H   glycopyrrolate  1 mg Per Tube BID   lisinopril  5 mg Per Tube Daily   mouth rinse  15 mL Mouth Rinse 10 times per day   melatonin  3 mg Per Tube QHS   pantoprazole sodium  40 mg Per Tube Daily   potassium chloride  40 mEq Per Tube Daily   pregabalin  100 mg Per Tube TID   sennosides  10 mL Per Tube QODAY   sodium chloride flush  10-40 mL Intracatheter Q12H   Continuous Infusions:  sodium chloride Stopped (05/02/21 0501)   feeding supplement (VITAL 1.5 CAL) 1,000 mL (05/07/21 0518)     LOS: 13 days   Rickey Barbara, MD [facility] Pager On Amion  If 7PM-7AM, please contact  night-coverage 05/07/2021, 2:40 PM

## 2021-05-07 NOTE — Progress Notes (Signed)
RT NOTES: Pt requested to go on PS/CPAP mode. Placed on 10/5. Pt tolerating well. Will continue to monitor.

## 2021-05-08 DIAGNOSIS — J9601 Acute respiratory failure with hypoxia: Secondary | ICD-10-CM | POA: Diagnosis not present

## 2021-05-08 DIAGNOSIS — K5900 Constipation, unspecified: Secondary | ICD-10-CM | POA: Diagnosis not present

## 2021-05-08 LAB — CREATININE, SERUM: Creatinine, Ser: <0.3 mg/dL — ABNORMAL LOW (ref 0.61–1.24)

## 2021-05-08 NOTE — Procedures (Signed)
Tracheostomy Change Note  Patient Details:   Name: Andrew Rivera DOB: [birth year] MRN: [id number]    Airway Documentation:     Evaluation  O2 sats: stable throughout Complications: No apparent complications Patient did tolerate procedure well. Bilateral Breath Sounds: Clear, Diminished   Pts trach changed from #6 Shiley cuffed to #6 Shiley cuffed per order. Positive CO2 color change, RN aware, RT will continue to monitor.   Rosalita Levan 05/08/2021, 3:32 PM

## 2021-05-08 NOTE — Progress Notes (Signed)
Patient states that he takes Sunday off from weaning on ventilator. Educated on how you have to try daily to make progress. Pt wife called, video chat and talked to pt about how he cant refuse to wean on ventilator if he wants to get better. Patient rolls eyes and nodes head to understanding.

## 2021-05-08 NOTE — Progress Notes (Signed)
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 CT of the abdomen shows colonic distention, colonic ileus without obstruction or perforation. He was initially admitted to [facility] service as he vent dependent.  GI consulted.  Colonic distention/ileus resolved with bowel regimen.  GI signed off.  [facility] on 04/25/2021.   Patient was hypotensive to 94/43 and febrile to 101.7 the morning of 7/8. Pancultured.  Started on meropenem and vancomycin.  Cultures NGTD.  Antibiotics discontinued 7/10. Remained afebrile and hemodynamically stable.  Patient is returning to [facility].  Ambulatory referral to lumbar neurology for outpatient EMG ordered.  Assessment & Plan:   Active Problems:   Ileus (HCC)   Abdominal distension   Constipation   Hypokalemia   Chronic respiratory failure with hypoxia (HCC)  Colonic distention/ileus: has history of constipation and colonic dilation.  Likely opiate induced.  Seem to be on fentanyl patch and Dilaudid. Seems to have resolved with MoviPrep.  Abdominal exam reassuring.  Brown loose stool in rectal tube bag. -Continue Senokot-S every other day -MiraLAX twice daily as needed -GI has since signed off.   Fever: Isolated fever to 101.7 the night of 7/7-7/8.  Unclear source of fever.  Has no leukocytosis.  CXR and UA reassuring.  Blood cultures NGTD.  Respiratory culture with normal flora.  Low suspicion for C. difficile. -IV meropenem and vancomycin 7/8-7/10.   -Remains stable off abx   Chronic respiratory failure, tracheostomy, vent dependent -PCCM following intermittently. -PCCM recs-periods of time on PSV weaning in order to move towards trach  collar trials. -Ambulatory referral to local neurology, Dr. Allena Katz for EMG ordered   Hypokalemia and hypomagnesemia: Resolved. -Continue K-Dur 40 mEq daily   Hypoglycemia without diagnosis of diabetes.  Resolved. Hypoalbuminemia: Albumin 1.6. -Continue tube feed as tolerated   Anemia of chronic disease: Hgb dropped about 2 g.  No melena or hematochezia.  Hemoccult negative.  Anemia panel consistent with anemia of chronic disease.  Likely dilutional and anemia of critical illness. 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   Chronic lower back pain/neuropathy/mood disorder/insomnia -Continue home Cymbalta and Ativan -Continue Lyrica, p.o. Dilaudid and fentanyl patch. -Scheduled Tylenol 960 mg 3 times daily per tube on 7/12   Nutrition Body mass index is 30.27 kg/m. Nutrition Problem: Inadequate oral intake Etiology: inability to eat Signs/Symptoms: NPO status Interventions: Tube feeding   DVT prophylaxis: SCD's Code Status: Full Family Communication: Pt in room, family at bedside  Status is: Inpatient  Remains inpatient appropriate because:Inpatient level of care appropriate due to severity of illness  Dispo: The patient is from:  [facility]              Anticipated d/c is to: LT[facility]              Patient currently is not medically stable to d/c.   Difficult to place patient No       Consultants:  GI PCCM  Procedures:    Antimicrobials: Anti-infectives (From admission, onward)    Start     Dose/Rate Route Frequency Ordered Stop   04/30/21 0330  vancomycin (VANCOREADY) IVPB 1500 mg/300 mL  Status:  Discontinued  1,500 mg 150 mL/hr over 120 Minutes Intravenous Every 24 hours 04/29/21 0237 05/01/21 0710   04/29/21 0330  vancomycin (VANCOREADY) IVPB 2000 mg/400 mL        2,000 mg 200 mL/hr over 120 Minutes Intravenous  Once 04/29/21 0237 04/29/21 0526   04/29/21 0330  meropenem (MERREM) 1 g in  sodium chloride 0.9 % 100 mL IVPB  Status:  Discontinued        1 g 200 mL/hr over 30 Minutes Intravenous Every 8 hours 04/29/21 0237 05/01/21 0710       Subjective: Eager to return to facility  Objective: Vitals:   05/08/21 1000 05/08/21 1100 05/08/21 1200 05/08/21 1500  BP: (!) 173/57 (!) 170/66 (!) 169/60 (!) 128/58  Pulse: 67 62 63 73  Resp: 18 16 16 16   Temp:      TempSrc:      SpO2: 98% 98% 99% 95%  Weight:      Height:        Intake/Output Summary (Last 24 hours) at 05/08/2021 1512 Last data filed at 05/08/2021 1000 Gross per 24 hour  Intake 650 ml  Output 2125 ml  Net -1475 ml    Filed Weights   05/05/21 0500 05/06/21 0355 05/07/21 0500  Weight: 90.3 kg 90.3 kg 90.8 kg    Examination: General exam: Laying in bed, in no acute distress Respiratory system: normal chest rise, clear, no audible wheezing Cardiovascular system: regular rhythm, s1-s2 Gastrointestinal system: Nondistended, nontender, pos BS Central nervous system: No seizures, no tremors Extremities: No cyanosis, no joint deformities Skin: No rashes, no pallor Psychiatry: Affect normal // no auditory hallucinations   Data Reviewed: I have personally reviewed following labs and imaging studies  CBC: Recent Labs  Lab 05/02/21 0500 05/05/21 0405 05/07/21 0544  WBC  --  5.5  --   HGB 7.6* 7.9* 8.9*  HCT 24.0* 25.3* 26.8*  MCV  --  95.5  --   PLT  --  238  --     Basic Metabolic Panel: Recent Labs  Lab 05/02/21 0500 05/04/21 0455 05/05/21 0405 05/07/21 0544  NA 138 138 136 137  K 4.0 4.2 4.1 4.4  CL 105 104 105 108  CO2 27 31 26 26   GLUCOSE 119* 101* 118* 129*  BUN 14 17 20  24*  CREATININE <0.30* <0.30* <0.30* <0.30*  CALCIUM 8.1* 8.1* 8.2* 8.5*  MG 1.9  --  2.1 2.1  PHOS 2.7  --  3.5 4.2    GFR: CrCl cannot be calculated (This lab value cannot be used to calculate CrCl because it is not a number: <0.30). Liver Function Tests: Recent Labs  Lab 05/02/21 0500 05/05/21 0405  05/07/21 0544  ALBUMIN 1.8* 2.0* 2.2*    No results for input(s): LIPASE, AMYLASE in the last 168 hours. No results for input(s): AMMONIA in the last 168 hours. Coagulation Profile: No results for input(s): INR, PROTIME in the last 168 hours. Cardiac Enzymes: No results for input(s): CKTOTAL, CKMB, CKMBINDEX, TROPONINI in the last 168 hours. BNP (last 3 results) No results for input(s): PROBNP in the last 8760 hours. HbA1C: No results for input(s): HGBA1C in the last 72 hours. CBG: Recent Labs  Lab 05/06/21 2001 05/07/21 0012 05/07/21 0332 05/07/21 0718 05/07/21 1202  GLUCAP 101* 103* 103* 106* 101*    Lipid Profile: No results for input(s): CHOL, HDL, LDLCALC, TRIG, CHOLHDL, LDLDIRECT in the last 72 hours. Thyroid Function Tests: No results for input(s): TSH, T4TOTAL, FREET4, T3FREE, THYROIDAB in the  last 72 hours. Anemia Panel: No results for input(s): VITAMINB12, FOLATE, FERRITIN, TIBC, IRON, RETICCTPCT in the last 72 hours. Sepsis Labs: No results for input(s): PROCALCITON, LATICACIDVEN in the last 168 hours.  Recent Results (from the past 240 hour(s))  Culture, Respiratory w Gram Stain     Status: None   Collection Time: 04/29/21  5:40 AM   Specimen: Tracheal Aspirate; Respiratory  Result Value Ref Range Status   Specimen Description TRACHEAL ASPIRATE  Final   Special Requests NONE  Final   Gram Stain   Final    ABUNDANT SQUAMOUS EPITHELIAL CELLS PRESENT ABUNDANT WBC PRESENT, PREDOMINANTLY PMN ABUNDANT GRAM POSITIVE RODS FEW GRAM POSITIVE COCCI FEW YEAST    Culture   Final    FEW Consistent with normal respiratory flora. NO STAPHYLOCOCCUS AUREUS ISOLATED Performed at [facility], [address] [place], [place] [address]    Report Status 05/01/2021 FINAL  Final  Culture, blood (routine x 2)     Status: None   Collection Time: 04/29/21  5:43 AM   Specimen: BLOOD RIGHT HAND  Result Value Ref Range Status   Specimen Description BLOOD RIGHT HAND   Final   Special Requests   Final    BOTTLES DRAWN AEROBIC AND ANAEROBIC Blood Culture adequate volume   Culture   Final    NO GROWTH 5 DAYS Performed at [facility], [address] [place], [place] [address]    Report Status 05/04/2021 FINAL  Final  Culture, blood (routine x 2)     Status: None   Collection Time: 04/29/21  5:47 AM   Specimen: BLOOD LEFT HAND  Result Value Ref Range Status   Specimen Description BLOOD LEFT HAND  Final   Special Requests   Final    BOTTLES DRAWN AEROBIC AND ANAEROBIC Blood Culture adequate volume   Culture   Final    NO GROWTH 5 DAYS Performed at [facility], [address] [place], [place] [address]    Report Status 05/04/2021 FINAL  Final      Radiology Studies: No results found.  Scheduled Meds:  acetaminophen (TYLENOL) oral liquid 160 mg/5 mL  960 mg Per Tube Q8H   doxazosin  2 mg Per Tube Daily   DULoxetine  30 mg Oral BID   enoxaparin (LOVENOX) injection  40 mg Subcutaneous Q24H   feeding supplement (PROSource TF)  45 mL Per Tube TID   fentaNYL  1 patch Transdermal Q72H   free water  100 mL Per Tube Q4H   lisinopril  5 mg Per Tube Daily   mouth rinse  15 mL Mouth Rinse 10 times per day   melatonin  3 mg Per Tube QHS   pantoprazole sodium  40 mg Per Tube Daily   potassium chloride  40 mEq Per Tube Daily   pregabalin  100 mg Per Tube TID   sennosides  10 mL Per Tube QODAY   sodium chloride flush  10-40 mL Intracatheter Q12H   Continuous Infusions:  sodium chloride Stopped (05/02/21 0501)   feeding supplement (VITAL 1.5 CAL) 55 mL/hr at 05/08/21 1000     LOS: 14 days   Rickey Barbara, MD [facility] Pager On Amion  If 7PM-7AM, please contact night-coverage 05/08/2021, 3:12 PM

## 2021-05-08 NOTE — Progress Notes (Signed)
Bath offered to pt last evening @ 2000. Pt refused and requested to be bathed in the morning. Pt refused bath this AM @ 0600 and requested to be bathed at 0630. Unfortunately, staff was unavaible to assist with bathing at this time. Pt notified he will be a day shift bath and he is agreeable. Will update day shift RN.

## 2021-05-09 DIAGNOSIS — J9601 Acute respiratory failure with hypoxia: Secondary | ICD-10-CM | POA: Diagnosis not present

## 2021-05-09 DIAGNOSIS — J9611 Chronic respiratory failure with hypoxia: Secondary | ICD-10-CM | POA: Diagnosis not present

## 2021-05-09 DIAGNOSIS — K5900 Constipation, unspecified: Secondary | ICD-10-CM | POA: Diagnosis not present

## 2021-05-09 LAB — GLUCOSE, CAPILLARY: Glucose-Capillary: 106 mg/dL — ABNORMAL HIGH (ref 70–99)

## 2021-05-09 NOTE — Progress Notes (Signed)
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 CT of the abdomen shows colonic distention, colonic ileus without obstruction or perforation. He was initially admitted to [facility] service as he vent dependent.  GI consulted.  Colonic distention/ileus resolved with bowel regimen.  GI signed off.  Transferred to [facility] on 04/25/2021.   Patient was hypotensive to 94/43 and febrile to 101.7 the morning of 7/8. Pancultured.  Started on meropenem and vancomycin.  Cultures NGTD.  Antibiotics discontinued 7/10. Remained afebrile and hemodynamically stable.  Patient is returning to [facility].  Ambulatory referral to lumbar neurology for outpatient EMG ordered.  Assessment & Plan:   Active Problems:   Ileus (HCC)   Abdominal distension   Constipation   Hypokalemia   Chronic respiratory failure with hypoxia (HCC)  Colonic distention/ileus: has history of constipation and colonic dilation.  Likely opiate induced.  Seem to be on fentanyl patch and Dilaudid. Seems to have resolved with MoviPrep.  -Continue Senokot-S every other day -MiraLAX twice daily as needed -GI has since signed off. -rectal tube was removed over weekend   Fever: Isolated fever to 101.7 the night of 7/7-7/8.  Unclear source of fever.  Has no leukocytosis.  CXR and UA reassuring.  Blood cultures NGTD.  Respiratory culture with normal flora.  Low suspicion for C. difficile. -IV meropenem and vancomycin 7/8-7/10.   -Remains stable off abx   Chronic respiratory failure, tracheostomy, vent dependent -PCCM following intermittently. -PCCM recs-periods of time on PSV weaning in order to move towards trach collar trials. -Ambulatory  referral to local neurology, Dr. Allena Katz for EMG ordered   Hypokalemia and hypomagnesemia: Resolved. -Continue K-Dur 40 mEq daily   Hypoglycemia without diagnosis of diabetes.  Resolved. Hypoalbuminemia: Albumin 1.6. -Continue tube feed as tolerated   Anemia of chronic disease: Hgb dropped about 2 g.  No melena or hematochezia.  Hemoccult negative.  Anemia panel consistent with anemia of chronic disease.  Likely dilutional and anemia of critical illness. 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   Chronic lower back pain/neuropathy/mood disorder/insomnia -Continue home Cymbalta and Ativan -Continue Lyrica, p.o. Dilaudid and fentanyl patch. -Scheduled Tylenol 960 mg 3 times daily per tube on 7/12   Nutrition Body mass index is 30.27 kg/m. Nutrition Problem: Inadequate oral intake Etiology: inability to eat Signs/Symptoms: NPO status Interventions: Tube feeding   DVT prophylaxis: SCD's Code Status: Full Family Communication: Pt in room, family at bedside  Status is: Inpatient  Remains inpatient appropriate because:Inpatient level of care appropriate due to severity of illness  Dispo: The patient is from:  [facility]              Anticipated d/c is to: LT[facility]              Patient currently is medically stable to d/c. Just awaiting dispo   Difficult to place patient No       Consultants:  GI PCCM  Procedures:    Antimicrobials: Anti-infectives (From admission, onward)    Start     Dose/Rate Route Frequency Ordered Stop   04/30/21 0330  vancomycin (VANCOREADY) IVPB 1500 mg/300 mL  Status:  Discontinued  1,500 mg 150 mL/hr over 120 Minutes Intravenous Every 24 hours 04/29/21 0237 05/01/21 0710   04/29/21 0330  vancomycin (VANCOREADY) IVPB 2000 mg/400 mL        2,000 mg 200 mL/hr over 120 Minutes Intravenous  Once 04/29/21 0237 04/29/21 0526   04/29/21 0330  meropenem (MERREM) 1 g in sodium chloride  0.9 % 100 mL IVPB  Status:  Discontinued        1 g 200 mL/hr over 30 Minutes Intravenous Every 8 hours 04/29/21 0237 05/01/21 0710       Subjective: Hoping to return to LTAC soon  Objective: Vitals:   05/09/21 1110 05/09/21 1200 05/09/21 1300 05/09/21 1400  BP:  (!) 117/48 (!) 125/51 (!) 117/49  Pulse:  79 81 87  Resp:  15 15 19   Temp: 98.7 F (37.1 C)     TempSrc: Oral     SpO2:  94% 95% 95%  Weight:      Height:        Intake/Output Summary (Last 24 hours) at 05/09/2021 1422 Last data filed at 05/09/2021 1300 Gross per 24 hour  Intake 1245 ml  Output 700 ml  Net 545 ml    Filed Weights   05/06/21 0355 05/07/21 0500 05/09/21 0402  Weight: 90.3 kg 90.8 kg 88 kg    Examination: General exam: Conversant, in no acute distress Respiratory system: normal chest rise, clear, no audible wheezing Cardiovascular system: regular rhythm, s1-s2 Gastrointestinal system: Nondistended, nontender, pos BS Central nervous system: No seizures, no tremors Extremities: No cyanosis, no joint deformities Skin: No rashes, no pallor Psychiatry: Affect normal // no auditory hallucinations   Data Reviewed: I have personally reviewed following labs and imaging studies  CBC: Recent Labs  Lab 05/05/21 0405 05/07/21 0544  WBC 5.5  --   HGB 7.9* 8.9*  HCT 25.3* 26.8*  MCV 95.5  --   PLT 238  --     Basic Metabolic Panel: Recent Labs  Lab 05/04/21 0455 05/05/21 0405 05/07/21 0544 05/08/21 1651  NA 138 136 137  --   K 4.2 4.1 4.4  --   CL 104 105 108  --   CO2 31 26 26   --   GLUCOSE 101* 118* 129*  --   BUN 17 20 24*  --   CREATININE <0.30* <0.30* <0.30* <0.30*  CALCIUM 8.1* 8.2* 8.5*  --   MG  --  2.1 2.1  --   PHOS  --  3.5 4.2  --     GFR: CrCl cannot be calculated (This lab value cannot be used to calculate CrCl because it is not a number: <0.30). Liver Function Tests: Recent Labs  Lab 05/05/21 0405 05/07/21 0544  ALBUMIN 2.0* 2.2*    No results for input(s):  LIPASE, AMYLASE in the last 168 hours. No results for input(s): AMMONIA in the last 168 hours. Coagulation Profile: No results for input(s): INR, PROTIME in the last 168 hours. Cardiac Enzymes: No results for input(s): CKTOTAL, CKMB, CKMBINDEX, TROPONINI in the last 168 hours. BNP (last 3 results) No results for input(s): PROBNP in the last 8760 hours. HbA1C: No results for input(s): HGBA1C in the last 72 hours. CBG: Recent Labs  Lab 05/07/21 0012 05/07/21 0332 05/07/21 0718 05/07/21 1202 05/09/21 1109  GLUCAP 103* 103* 106* 101* 106*    Lipid Profile: No results for input(s): CHOL, HDL, LDLCALC, TRIG, CHOLHDL, LDLDIRECT in the last 72 hours. Thyroid Function Tests: No results for input(s): TSH, T4TOTAL, FREET4, T3FREE,  THYROIDAB in the last 72 hours. Anemia Panel: No results for input(s): VITAMINB12, FOLATE, FERRITIN, TIBC, IRON, RETICCTPCT in the last 72 hours. Sepsis Labs: No results for input(s): PROCALCITON, LATICACIDVEN in the last 168 hours.  No results found for this or any previous visit (from the past 240 hour(s)).    Radiology Studies: No results found.  Scheduled Meds:  acetaminophen (TYLENOL) oral liquid 160 mg/5 mL  960 mg Per Tube Q8H   doxazosin  2 mg Per Tube Daily   DULoxetine  30 mg Oral BID   enoxaparin (LOVENOX) injection  40 mg Subcutaneous Q24H   feeding supplement (PROSource TF)  45 mL Per Tube TID   fentaNYL  1 patch Transdermal Q72H   free water  100 mL Per Tube Q4H   lisinopril  5 mg Per Tube Daily   mouth rinse  15 mL Mouth Rinse 10 times per day   melatonin  3 mg Per Tube QHS   pantoprazole sodium  40 mg Per Tube Daily   potassium chloride  40 mEq Per Tube Daily   pregabalin  100 mg Per Tube TID   sennosides  10 mL Per Tube QODAY   sodium chloride flush  10-40 mL Intracatheter Q12H   Continuous Infusions:  sodium chloride Stopped (05/02/21 0501)   feeding supplement (VITAL 1.5 CAL) 1,000 mL (05/08/21 2330)     LOS: 15 days    Rickey Barbara, MD [facility] Pager On Amion  If 7PM-7AM, please contact night-coverage 05/09/2021, 2:22 PM

## 2021-05-09 NOTE — TOC Progression Note (Signed)
Transition of Care [facility]) - Progression Note    Patient Details  Name: Andrew Rivera MRN: [id number] Date of Birth: Jan 18, 1951  Transition of Care [facility]) CM/SW Contact  Ralene Bathe, LCSWA Phone Number: 05/09/2021, 11:14 AM  Clinical Narrative:    10:27-  CSW contacted Prem with [facility] to inquire about there was a bed was available today.  There is not a bed available.  CSW will continue to check periodically for bed availability.  11:15- CSW contacted the patient's wife to give an update.  Andrew Rivera would like for the patient to go back to [facility] and not to another facility as they are further away.    Pending: bed availability         Expected Discharge Plan and Services           Expected Discharge Date: 05/05/21                                     Social Determinants of Health (SDOH) Interventions    Readmission Risk Interventions No flowsheet data found.

## 2021-05-09 NOTE — Progress Notes (Signed)
   NAME:  AVERI Rivera, MRN:  [id number], DOB:  1951-01-29, LOS: 15 ADMISSION DATE:  04/23/2021, CONSULTATION DATE:  04/24/21 REFERRING MD:  ED, CHIEF COMPLAINT:  abdominal pain   History of Present Illness:  70 yo male former smoker with hx of PNA and critical illness myopathy presented with abdominal distention and pain.  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   Pertinent  Medical History  Chronic pain, IJ thromboembolism, Septic shock, Pneumonia, Constipation with ileus, Critical illness myopathy, MSSA Bacteremia in November 2020, Pressure wounds  Significant Hospital Events: Including procedures, antibiotic start and stop dates in addition to other pertinent events   7/02 Admit 7/08 fever 101.7, start vancomycin/cefepime 7/12 trach collar trial did not last long 7/14 Ileus resolved, tolerating PSV weans 7/17 Routine trach change #6 cuffed shiley  Interim History / Subjective:   No acute events, tolerating PSV weans  Objective   Blood pressure (!) 167/67, pulse 91, temperature 99.1 F (37.3 C), temperature source Oral, resp. rate 20, height 5\' 8"  (1.727 m), weight 88 kg, SpO2 97 %.    Vent Mode: PRVC FiO2 (%):  [28 %] 28 % Set Rate:  [16 bmp] 16 bmp Vt Set:  [550 mL] 550 mL PEEP:  [5 cmH20] 5 cmH20 Plateau Pressure:  [14 cmH20-18 cmH20] 14 cmH20   Intake/Output Summary (Last 24 hours) at 05/09/2021 0838 Last data filed at 05/09/2021 0800 Gross per 24 hour  Intake 870 ml  Output 1450 ml  Net -580 ml   Filed Weights   05/06/21 0355 05/07/21 0500 05/09/21 0402  Weight: 90.3 kg 90.8 kg 88 kg    Examination: Gen:      No acute distress, chronically ill appearing HEENT:  EOMI, sclera anicteric, trach Neck:     No masses; no thyromegaly Lungs:    Clear to auscultation bilaterally; normal respiratory effort CV:         Regular rate and rhythm; no murmurs Abd:      + bowel sounds; soft,  non-tender; no palpable masses, no distension Ext:    No edema; adequate peripheral perfusion Skin:      Warm and dry; no rash Neuro: Awake, responsive  Resolved Hospital Problem list   Colonic Ileus Hypokalemia  Assessment & Plan:   Chronic hypoxic respiratory failure in setting of critical illness myopathy, along with 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 Tracheostomy and ventilator dependence. PSV weans are tolerated. Attempt to progress to trach collar during the day but has not made much progress He will continue to need vent support at night Intermittent CXR Ambulatory referral to outpatient neurology for EMG  DM type 2 Anemia of critical illness and chronic disease Mood disorder with insomnia Chronic pain Per primary team  Signature:    05/11/21 MD [facility] See Amion for pager  If no response to pager , please call [phone] until 7pm After 7:00 pm call Elink  [phone] 05/09/2021, 8:51 AM

## 2021-05-10 DIAGNOSIS — K5900 Constipation, unspecified: Secondary | ICD-10-CM | POA: Diagnosis not present

## 2021-05-10 DIAGNOSIS — J9601 Acute respiratory failure with hypoxia: Secondary | ICD-10-CM | POA: Diagnosis not present

## 2021-05-10 NOTE — Progress Notes (Signed)
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 CT of the abdomen shows colonic distention, colonic ileus without obstruction or perforation. He was initially admitted to [facility] service as he vent dependent.  GI consulted.  Colonic distention/ileus resolved with bowel regimen.  GI signed off.  Transferred to [facility] service on 04/25/2021.   Patient was hypotensive to 94/43 and febrile to 101.7 the morning of 7/8. Pancultured.  Started on meropenem and vancomycin.  Cultures NGTD.  Antibiotics discontinued 7/10. Remained afebrile and hemodynamically stable.  Patient is returning to [facility].  Ambulatory referral to lumbar neurology for outpatient EMG ordered.  Assessment & Plan:   Active Problems:   Ileus (HCC)   Abdominal distension   Constipation   Hypokalemia   Chronic respiratory failure with hypoxia (HCC)  Colonic distention/ileus: has history of constipation and colonic dilation.  Likely opiate induced.  Seem to be on fentanyl patch and Dilaudid. Seems to have resolved with MoviPrep.  -Continue Senokot-S every other day -MiraLAX twice daily as needed -GI has since signed off. -rectal tube was removed over weekend -Remains stable   Fever: Isolated fever to 101.7 the night of 7/7-7/8.  Unclear source of fever.  Has no leukocytosis.  CXR and UA reassuring.  Blood cultures NGTD.  Respiratory culture with normal flora.  Low suspicion for C. difficile. -IV meropenem and vancomycin 7/8-7/10.   -Remains stable off abx   Chronic respiratory failure, tracheostomy, vent dependent -PCCM following intermittently. -PCCM recs-periods of time on PSV weaning in order to move towards trach collar  trials. -Ambulatory referral to local neurology, Dr. Allena Katz for EMG ordered   Hypokalemia and hypomagnesemia: Resolved. -Continue K-Dur 40 mEq daily   Hypoglycemia without diagnosis of diabetes.  Resolved. Hypoalbuminemia: Albumin 1.6. -Continue tube feed as tolerated   Anemia of chronic disease: Hgb dropped about 2 g.  No melena or hematochezia.  Hemoccult negative.  Anemia panel consistent with anemia of chronic disease.  Likely dilutional and anemia of critical illness. Continue to transfuse for hgb <7 -Will repeat cbc in AM  Functional quadriplegia/myopathy of critical illness-bedbound at baseline.   Chronic lower back pain/neuropathy/mood disorder/insomnia -Continue home Cymbalta and Ativan -Continue Lyrica, p.o. Dilaudid and fentanyl patch. -Scheduled Tylenol 960 mg 3 times daily per tube on 7/12   Nutrition Body mass index is 30.27 kg/m. Nutrition Problem: Inadequate oral intake Etiology: inability to eat Signs/Symptoms: NPO status Interventions: Tube feeding   DVT prophylaxis: SCD's Code Status: Full Family Communication: Pt in room, family currently not at bedside  Status is: Inpatient  Remains inpatient appropriate because:Inpatient level of care appropriate due to severity of illness  Dispo: The patient is from:  LTAC              Anticipated d/c is to: LTAC              Patient currently is medically stable to d/c. Just awaiting dispo   Difficult to place patient No       Consultants:  GI PCCM  Procedures:    Antimicrobials: Anti-infectives (From admission, onward)    Start     Dose/Rate Route Frequency Ordered Stop   04/30/21 0330  vancomycin (VANCOREADY) IVPB 1500 mg/300 mL  Status:  Discontinued        1,500 mg 150 mL/hr over 120 Minutes Intravenous Every 24 hours 04/29/21 0237 05/01/21 0710   04/29/21 0330  vancomycin (VANCOREADY) IVPB 2000 mg/400 mL        2,000 mg 200 mL/hr over 120 Minutes Intravenous  Once 04/29/21 0237 04/29/21 0526    04/29/21 0330  meropenem (MERREM) 1 g in sodium chloride 0.9 % 100 mL IVPB  Status:  Discontinued        1 g 200 mL/hr over 30 Minutes Intravenous Every 8 hours 04/29/21 0237 05/01/21 0710       Subjective: Eager to return to [facility]  Objective: Vitals:   05/10/21 0800 05/10/21 0900 05/10/21 1000 05/10/21 1100  BP: (!) 113/49 (!) 123/48 (!) 120/51 (!) 124/49  Pulse: 74 72 71 73  Resp: 16 18 16 16   Temp:      TempSrc:      SpO2: 96% 96% 96% 96%  Weight:      Height:        Intake/Output Summary (Last 24 hours) at 05/10/2021 1325 Last data filed at 05/10/2021 1100 Gross per 24 hour  Intake 1665 ml  Output 1425 ml  Net 240 ml    Filed Weights   05/07/21 0500 05/09/21 0402 05/10/21 0423  Weight: 90.8 kg 88 kg 88.4 kg    Examination: General exam: Awake, laying in bed, in nad Respiratory system: Normal respiratory effort, no wheezing Cardiovascular system: regular rate, s1, s2 Gastrointestinal system: Soft, nondistended, positive BS Central nervous system: CN2-12 grossly intact, strength intact Extremities: Perfused, no clubbing Skin: Normal skin turgor, no notable skin lesions seen Psychiatry: Mood normal // no visual hallucinations    Data Reviewed: I have personally reviewed following labs and imaging studies  CBC: Recent Labs  Lab 05/05/21 0405 05/07/21 0544  WBC 5.5  --   HGB 7.9* 8.9*  HCT 25.3* 26.8*  MCV 95.5  --   PLT 238  --     Basic Metabolic Panel: Recent Labs  Lab 05/04/21 0455 05/05/21 0405 05/07/21 0544 05/08/21 1651  NA 138 136 137  --   K 4.2 4.1 4.4  --   CL 104 105 108  --   CO2 31 26 26   --   GLUCOSE 101* 118* 129*  --   BUN 17 20 24*  --   CREATININE <0.30* <0.30* <0.30* <0.30*  CALCIUM 8.1* 8.2* 8.5*  --   MG  --  2.1 2.1  --   PHOS  --  3.5 4.2  --     GFR: CrCl cannot be calculated (This lab value cannot be used to calculate CrCl because it is not a number: <0.30). Liver Function Tests: Recent Labs  Lab  05/05/21 0405 05/07/21 0544  ALBUMIN 2.0* 2.2*    No results for input(s): LIPASE, AMYLASE in the last 168 hours. No results for input(s): AMMONIA in the last 168 hours. Coagulation Profile: No results for input(s): INR, PROTIME in the last 168 hours. Cardiac Enzymes: No results for input(s): CKTOTAL, CKMB, CKMBINDEX, TROPONINI in the last 168 hours. BNP (last 3 results) No results for input(s): PROBNP in the last 8760 hours. HbA1C: No results for input(s): HGBA1C in the last 72 hours. CBG: Recent Labs  Lab 05/07/21 0012 05/07/21 0332 05/07/21 0718 05/07/21 1202 05/09/21 1109  GLUCAP 103* 103* 106* 101* 106*    Lipid Profile: No results for input(s): CHOL, HDL, LDLCALC, TRIG, CHOLHDL, LDLDIRECT in the last 72 hours.  Thyroid Function Tests: No results for input(s): TSH, T4TOTAL, FREET4, T3FREE, THYROIDAB in the last 72 hours. Anemia Panel: No results for input(s): VITAMINB12, FOLATE, FERRITIN, TIBC, IRON, RETICCTPCT in the last 72 hours. Sepsis Labs: No results for input(s): PROCALCITON, LATICACIDVEN in the last 168 hours.  No results found for this or any previous visit (from the past 240 hour(s)).    Radiology Studies: No results found.  Scheduled Meds:  acetaminophen (TYLENOL) oral liquid 160 mg/5 mL  960 mg Per Tube Q8H   doxazosin  2 mg Per Tube Daily   DULoxetine  30 mg Oral BID   enoxaparin (LOVENOX) injection  40 mg Subcutaneous Q24H   feeding supplement (PROSource TF)  45 mL Per Tube TID   fentaNYL  1 patch Transdermal Q72H   free water  100 mL Per Tube Q4H   lisinopril  5 mg Per Tube Daily   mouth rinse  15 mL Mouth Rinse 10 times per day   melatonin  3 mg Per Tube QHS   pantoprazole sodium  40 mg Per Tube Daily   potassium chloride  40 mEq Per Tube Daily   pregabalin  100 mg Per Tube TID   sennosides  10 mL Per Tube QODAY   sodium chloride flush  10-40 mL Intracatheter Q12H   Continuous Infusions:  sodium chloride Stopped (05/02/21 0501)    feeding supplement (VITAL 1.5 CAL) 1,000 mL (05/09/21 1848)     LOS: 16 days   Rickey Barbara, MD [facility] Pager On Amion  If 7PM-7AM, please contact night-coverage 05/10/2021, 1:25 PM

## 2021-05-10 NOTE — Progress Notes (Addendum)
eLink Physician-Brief Progress Note Patient Name: Andrew Rivera DOB: 1951-08-14 MRN: [id number]   Date of Service  05/10/2021  HPI/Events of Note  Bedside RN asking for labs, patient having ectopy while asleep. QTc is 0.424.  eICU Interventions  BNP, Mg++  ordered.        Thomasene Lot Kohler Pellerito 05/10/2021, 11:08 PM

## 2021-05-11 DIAGNOSIS — J9601 Acute respiratory failure with hypoxia: Secondary | ICD-10-CM | POA: Diagnosis not present

## 2021-05-11 DIAGNOSIS — J9611 Chronic respiratory failure with hypoxia: Secondary | ICD-10-CM | POA: Diagnosis not present

## 2021-05-11 DIAGNOSIS — K5900 Constipation, unspecified: Secondary | ICD-10-CM | POA: Diagnosis not present

## 2021-05-11 LAB — BASIC METABOLIC PANEL
Anion gap: 7 (ref 5–15)
BUN: 40 mg/dL — ABNORMAL HIGH (ref 8–23)
CO2: 24 mmol/L (ref 22–32)
Calcium: 8.3 mg/dL — ABNORMAL LOW (ref 8.9–10.3)
Chloride: 102 mmol/L (ref 98–111)
Creatinine, Ser: <0.3 mg/dL — ABNORMAL LOW (ref 0.61–1.24)
Glucose, Bld: 101 mg/dL — ABNORMAL HIGH (ref 70–99)
Potassium: 5.1 mmol/L (ref 3.5–5.1)
Sodium: 133 mmol/L — ABNORMAL LOW (ref 135–145)

## 2021-05-11 LAB — MAGNESIUM: Magnesium: 2.4 mg/dL (ref 1.7–2.4)

## 2021-05-11 MED ORDER — JEVITY 1.5 CAL/FIBER PO LIQD
1000.0000 mL | ORAL | Status: DC
  Administered 2021-05-11 – 2021-06-09 (×27): 1000 mL
  Filled 2021-05-11 (×46): qty 1000

## 2021-05-11 NOTE — Progress Notes (Signed)
PROGRESS NOTE  Andrew Rivera VFM:[id number] DOB: 04-22-1951 DOA: 04/23/2021 PCP: Larena Glassman, MD   LOS: 17 days   Brief Narrative / Interim history: 70 year old male with chronic trach/chronic vent at [facility] - vent side, not LTAC, history of constipation chronic irritation, prior pneumonia, critical illness myopathy functional quadriplegia, comes into the hospital with abdominal distention.  CT on admission showed colonic distention/ileus without obstruction or perforation.  GI consulted, he was placed on aggressive bowel regimen with resolution of his ileus.  GI signed off and eventually was transferred to the hospitalist service.  Patient had an event of hypotension and fever on 7/8, he was briefly on antibiotics but all cultures were negative and has been monitored off antibiotics since 7/10.  He has had no further issues, awaiting placement back  Subjective / 24h Interval events: Awake, alert this morning.  Mouths words appropriately, no abdominal pain, no chest pain, no shortness of breath.  Assessment & Plan: Principal Problem Colonic distention/ileus-he has a history of the same, he was admitted to the hospital and GI was consulted.  This was likely opioid induced, he had a movie prep with resolution of his symptoms.  He is now maintained on MiraLAX, Senokot, his ileus resolved.  Stable for discharge  Active Problems Isolated fever 7/8-no clear source, all cultures have remained negative.  He is stable off antibiotics  Functional quadriplegia, chronic respiratory failure, tracheostomy and vent dependent, PEG tube in place-all stable at this point  Hypokalemia/hypomagnesemia-potassium on the high side, stop standing potassium supplements  Anemia of chronic disease-no bleeding, hemoglobin overall stable  Chronic low back pain, neuropathy, mood disorder-continue home medications, Cymbalta, Ativan, Lyrica as well as narcotics.   Scheduled Meds:  acetaminophen (TYLENOL) oral  liquid 160 mg/5 mL  960 mg Per Tube Q8H   doxazosin  2 mg Per Tube Daily   DULoxetine  30 mg Oral BID   enoxaparin (LOVENOX) injection  40 mg Subcutaneous Q24H   feeding supplement (PROSource TF)  45 mL Per Tube TID   fentaNYL  1 patch Transdermal Q72H   free water  100 mL Per Tube Q4H   lisinopril  5 mg Per Tube Daily   mouth rinse  15 mL Mouth Rinse 10 times per day   melatonin  3 mg Per Tube QHS   pantoprazole sodium  40 mg Per Tube Daily   pregabalin  100 mg Per Tube TID   sennosides  10 mL Per Tube QODAY   sodium chloride flush  10-40 mL Intracatheter Q12H   Continuous Infusions:  sodium chloride Stopped (05/02/21 0501)   feeding supplement (VITAL 1.5 CAL) 1,000 mL (05/10/21 1458)   PRN Meds:.HYDROmorphone, hydrOXYzine, ipratropium-albuterol, LORazepam, ondansetron (ZOFRAN) IV, polyethylene glycol, sodium chloride flush  Diet Orders (From admission, onward)     Start     Ordered   04/24/21 0057  Diet NPO time specified  Diet effective now        04/24/21 0101            DVT prophylaxis: enoxaparin (LOVENOX) injection 40 mg Start: 04/30/21 1200 Place and maintain sequential compression device Start: 04/30/21 0733 SCDs Start: 04/24/21 0054     Code Status: Full Code  Family Communication: No family at bedside  Status is: Inpatient  Remains inpatient appropriate because:Inpatient level of care appropriate due to severity of illness  Dispo: The patient is from: SNF              Anticipated d/c is to: SNF  Patient currently is medically stable to d/c.   Difficult to place patient No   Level of care: ICU  Consultants:  PCCM  Procedures:  None   Microbiology  None   Antimicrobials: none    Objective: Vitals:   05/11/21 0500 05/11/21 0600 05/11/21 0700 05/11/21 0721  BP: (!) 119/43 (!) 143/43 (!) 128/49   Pulse: 68 72 76   Resp: 16 16 16    Temp:    98.7 F (37.1 C)  TempSrc:    Oral  SpO2: 97% 97% 96%   Weight:      Height:         Intake/Output Summary (Last 24 hours) at 05/11/2021 1057 Last data filed at 05/11/2021 0700 Gross per 24 hour  Intake 2085 ml  Output 1425 ml  Net 660 ml   Filed Weights   05/09/21 0402 05/10/21 0423 05/11/21 0341  Weight: 88 kg 88.4 kg 87.8 kg    Examination:  Constitutional: NAD Eyes: no scleral icterus ENMT: Mucous membranes are moist.  Neck: normal, supple Respiratory: clear to auscultation bilaterally, no wheezing, no crackles. Normal respiratory effort.  Cardiovascular: Regular rate and rhythm, no murmurs / rubs / gallops. No LE edema.  Abdomen: non distended, no tenderness. Bowel sounds positive.  Musculoskeletal: no clubbing / cyanosis.  Skin: no rashes Neurologic: Generalized weakness throughout   Data Reviewed: I have independently reviewed following labs and imaging studies   CBC: Recent Labs  Lab 05/05/21 0405 05/07/21 0544  WBC 5.5  --   HGB 7.9* 8.9*  HCT 25.3* 26.8*  MCV 95.5  --   PLT 238  --    Basic Metabolic Panel: Recent Labs  Lab 05/05/21 0405 05/07/21 0544 05/08/21 1651 05/11/21 0003  NA 136 137  --  133*  K 4.1 4.4  --  5.1  CL 105 108  --  102  CO2 26 26  --  24  GLUCOSE 118* 129*  --  101*  BUN 20 24*  --  40*  CREATININE <0.30* <0.30* <0.30* <0.30*  CALCIUM 8.2* 8.5*  --  8.3*  MG 2.1 2.1  --  2.4  PHOS 3.5 4.2  --   --    Liver Function Tests: Recent Labs  Lab 05/05/21 0405 05/07/21 0544  ALBUMIN 2.0* 2.2*   Coagulation Profile: No results for input(s): INR, PROTIME in the last 168 hours. HbA1C: No results for input(s): HGBA1C in the last 72 hours. CBG: Recent Labs  Lab 05/07/21 0012 05/07/21 0332 05/07/21 0718 05/07/21 1202 05/09/21 1109  GLUCAP 103* 103* 106* 101* 106*    No results found for this or any previous visit (from the past 240 hour(s)).   Radiology Studies: No results found.   05/11/21, MD, PhD [facility]  Between 7 am - 7 pm I am available, please contact me via Amion  (for emergencies) or Securechat (non urgent messages)  Between 7 pm - 7 am I am not available, please contact night coverage MD/APP via Amion

## 2021-05-11 NOTE — Progress Notes (Signed)
Nutrition Follow-up  DOCUMENTATION CODES:   Not applicable  INTERVENTION:   TF via PEG, change to standard formula with fiber: Jevity 1.5 at 55 ml/h (1320 ml/d) Prosource TF 45 ml TID  Provides 2100 kcal, 117 gm protein, 1003 ml free water daily  Continue free water flushes 100 ml every 4 hours for total of 1603 ml per day. Recommend adjust free water flushes as needed based on sodium levels.   NUTRITION DIAGNOSIS:   Inadequate oral intake related to inability to eat as evidenced by NPO status.  Ongoing   GOAL:   Patient will meet greater than or equal to 90% of their needs  Progressing   MONITOR:   I & O's, TF tolerance  REASON FOR ASSESSMENT:   Ventilator    ASSESSMENT:   Pt admitted from [facility] with increasing abdominal distention and abdominal pain due to constipation and ileus. Pt with PMH of chronic pain, septic shock, chronic respiratory failure, recent ED visit for constipation, hx spinal surgeries, MSSA bacteriemia and pressure injury.  Discussed patient in ICU rounds and with RN today. Patient is awaiting LTACH placement. Patient is tolerating TF at goal rate. Currently receiving Vital 1.5 at 55 ml/h via PEG with Prosource TF 45 ml TID. Will transition to a standard formula with fiber.   Patient remains intubated on ventilator support via trach MV: 8.2 L/min Temp (24hrs), Avg:99.1 F (37.3 C), Min:98.7 F (37.1 C), Max:99.8 F (37.7 C)   Labs reviewed. Na 133 CBG: no longer checking  Medications reviewed and include protonix, Senokot.  Weight 87.8 kg today, down from 90.9 kg on admission 7/3. I/O -9 L since admission Rectal tube removed, LBM 7/19 UOP 1425 ml x 24 hours  Diet Order:   Diet Order             Diet NPO time specified  Diet effective now                   EDUCATION NEEDS:   Not appropriate for education at this time  Skin:  Skin Assessment: Reviewed RN Assessment  Last BM:  7/19  Height:   Ht Readings from  Last 1 Encounters:  05/10/21 5\' 8"  (1.727 m)    Weight:   Wt Readings from Last 1 Encounters:  05/11/21 87.8 kg    BMI:  Body mass index is 29.43 kg/m.  Estimated Nutritional Needs:   Kcal:  1900-2100  Protein:  100-125 grams  Fluid:  >1.9 L/day    05/13/21, RD, LDN, CNSC Please refer to Amion for contact information.

## 2021-05-11 NOTE — Progress Notes (Signed)
Assisted tele visit to patient with wife.  Thomas, Diaz Crago Renee, RN   

## 2021-05-11 NOTE — Progress Notes (Signed)
RT note- Patient stated he was tired, placed back to full support as charted

## 2021-05-12 DIAGNOSIS — J9611 Chronic respiratory failure with hypoxia: Secondary | ICD-10-CM | POA: Diagnosis not present

## 2021-05-12 DIAGNOSIS — K5900 Constipation, unspecified: Secondary | ICD-10-CM | POA: Diagnosis not present

## 2021-05-12 DIAGNOSIS — J9601 Acute respiratory failure with hypoxia: Secondary | ICD-10-CM | POA: Diagnosis not present

## 2021-05-12 MED ORDER — POLYETHYLENE GLYCOL 3350 17 G PO PACK
17.0000 g | PACK | Freq: Two times a day (BID) | ORAL | Status: DC
  Administered 2021-05-12 – 2021-06-09 (×44): 17 g
  Filled 2021-05-12 (×47): qty 1

## 2021-05-12 MED ORDER — SENNOSIDES 8.8 MG/5ML PO SYRP
10.0000 mL | ORAL_SOLUTION | Freq: Two times a day (BID) | ORAL | Status: DC
  Administered 2021-05-12 – 2021-06-08 (×46): 10 mL
  Filled 2021-05-12 (×47): qty 10

## 2021-05-12 NOTE — TOC Progression Note (Signed)
Transition of Care [facility]) - Progression Note    Patient Details  Name: THERIN VETSCH MRN: [id number] Date of Birth: 05/04/1951  Transition of Care [facility]) CM/SW Contact  Ralene Bathe, LCSWA Phone Number: 05/12/2021, 10:38 AM  Clinical Narrative:    09:35-  CSW contacted Prem with [facility] and inquired about the facilities ability to accept the patient.  CSW is awaiting a response.  CSW contacted Irving Burton with [facility]).  Insurance authorization is still pending.         Expected Discharge Plan and Services           Expected Discharge Date: 05/05/21                                     Social Determinants of Health (SDOH) Interventions    Readmission Risk Interventions No flowsheet data found.

## 2021-05-12 NOTE — Progress Notes (Signed)
Assisted tele visit to patient with wife.  Thomas, Mykell Rawl Renee, RN   

## 2021-05-12 NOTE — Progress Notes (Signed)
PROGRESS NOTE  OGDEN HANDLIN HCW:[id number] DOB: [birth year] DOA: 04/23/2021 PCP: Larena Glassman, MD   LOS: 18 days   Brief Narrative / Interim history: 70 year old male with chronic trach/chronic vent at [facility] - vent side, not LTAC, history of constipation chronic irritation, prior pneumonia, critical illness myopathy functional quadriplegia, comes into the hospital with abdominal distention.  CT on admission showed colonic distention/ileus without obstruction or perforation.  GI consulted, he was placed on aggressive bowel regimen with resolution of his ileus.  GI signed off and eventually was transferred to the hospitalist service.  Patient had an event of hypotension and fever on 7/8, he was briefly on antibiotics but all cultures were negative and has been monitored off antibiotics since 7/10.  He has had no further issues, awaiting placement back  Subjective / 24h Interval events: Awake this morning, alert.  Mouths words appropriately, no abdominal pain, no chest pain, no shortness of breath.  Has not had a bowel movement in couple of days.  Assessment & Plan: Principal Problem Colonic distention/ileus-he has a history of the same, he was admitted to the hospital and GI was consulted.  This was likely opioid induced, he had a movie prep with resolution of his symptoms.  Change PRN Senokot and MiraLAX to scheduled today as he has not had a bowel movement in 2 days.  Closely monitor  Active Problems Isolated fever 7/8-no clear source, all cultures have remained negative.  Remained stable off antibiotics  Functional quadriplegia, chronic respiratory failure, tracheostomy and vent dependent, PEG tube in place-all stable at this point  Hypokalemia/hypomagnesemia-continue to monitor potassium  Anemia of chronic disease-no bleeding, hemoglobin overall stable  Chronic low back pain, neuropathy, mood disorder-continue home medications, Cymbalta, Ativan, Lyrica as well as  narcotics.   Scheduled Meds:  acetaminophen (TYLENOL) oral liquid 160 mg/5 mL  960 mg Per Tube Q8H   doxazosin  2 mg Per Tube Daily   DULoxetine  30 mg Oral BID   enoxaparin (LOVENOX) injection  40 mg Subcutaneous Q24H   feeding supplement (PROSource TF)  45 mL Per Tube TID   fentaNYL  1 patch Transdermal Q72H   free water  100 mL Per Tube Q4H   lisinopril  5 mg Per Tube Daily   mouth rinse  15 mL Mouth Rinse 10 times per day   melatonin  3 mg Per Tube QHS   pantoprazole sodium  40 mg Per Tube Daily   polyethylene glycol  17 g Per Tube BID   pregabalin  100 mg Per Tube TID   sennosides  10 mL Per Tube BID   sodium chloride flush  10-40 mL Intracatheter Q12H   Continuous Infusions:  sodium chloride Stopped (05/02/21 0501)   feeding supplement (JEVITY 1.5 CAL/FIBER) 1,000 mL (05/11/21 1416)   PRN Meds:.HYDROmorphone, hydrOXYzine, ipratropium-albuterol, LORazepam, ondansetron (ZOFRAN) IV, sodium chloride flush  Diet Orders (From admission, onward)     Start     Ordered   04/24/21 0057  Diet NPO time specified  Diet effective now        04/24/21 0101            DVT prophylaxis: enoxaparin (LOVENOX) injection 40 mg Start: 04/30/21 1200 Place and maintain sequential compression device Start: 04/30/21 0733 SCDs Start: 04/24/21 0054     Code Status: Full Code  Family Communication: No family at bedside  Status is: Inpatient  Remains inpatient appropriate because:Inpatient level of care appropriate due to severity of illness  Dispo: The patient  is from: SNF              Anticipated d/c is to: SNF              Patient currently is medically stable to d/c.   Difficult to place patient No   Level of care: ICU  Consultants:  PCCM  Procedures:  None   Microbiology  None   Antimicrobials: none    Objective: Vitals:   05/12/21 0612 05/12/21 0700 05/12/21 0725 05/12/21 0830  BP: (!) 120/50 (!) 121/47  (!) 117/49  Pulse: 71 69  69  Resp: 16 16  18   Temp:    98.6 F (37 C)   TempSrc:   Oral   SpO2: 96% 96%  97%  Weight:      Height:        Intake/Output Summary (Last 24 hours) at 05/12/2021 0932 Last data filed at 05/12/2021 0500 Gross per 24 hour  Intake 1445 ml  Output 1225 ml  Net 220 ml    Filed Weights   05/10/21 0423 05/11/21 0341 05/12/21 0355  Weight: 88.4 kg 87.8 kg 87.6 kg    Examination:  Constitutional: No distress Eyes: No icterus ENMT: mmm Neck: normal, supple Respiratory: Clear bilaterally, no wheezing Cardiovascular: Regular rate and rhythm, no murmurs, no peripheral edema Abdomen: Soft, nontender nondistended, bowel sounds positive Musculoskeletal: no clubbing / cyanosis.  Skin: No rashes seen Neurologic: Paraplegic   Data Reviewed: I have independently reviewed following labs and imaging studies   CBC: Recent Labs  Lab 05/07/21 0544  HGB 8.9*  HCT 26.8*    Basic Metabolic Panel: Recent Labs  Lab 05/07/21 0544 05/08/21 1651 05/11/21 0003  NA 137  --  133*  K 4.4  --  5.1  CL 108  --  102  CO2 26  --  24  GLUCOSE 129*  --  101*  BUN 24*  --  40*  CREATININE <0.30* <0.30* <0.30*  CALCIUM 8.5*  --  8.3*  MG 2.1  --  2.4  PHOS 4.2  --   --     Liver Function Tests: Recent Labs  Lab 05/07/21 0544  ALBUMIN 2.2*    Coagulation Profile: No results for input(s): INR, PROTIME in the last 168 hours. HbA1C: No results for input(s): HGBA1C in the last 72 hours. CBG: Recent Labs  Lab 05/07/21 0012 05/07/21 0332 05/07/21 0718 05/07/21 1202 05/09/21 1109  GLUCAP 103* 103* 106* 101* 106*     No results found for this or any previous visit (from the past 240 hour(s)).   Radiology Studies: No results found.   05/11/21, MD, PhD [facility]  Between 7 am - 7 pm I am available, please contact me via Amion (for emergencies) or Securechat (non urgent messages)  Between 7 pm - 7 am I am not available, please contact night coverage MD/APP via Amion

## 2021-05-13 DIAGNOSIS — J9611 Chronic respiratory failure with hypoxia: Secondary | ICD-10-CM | POA: Diagnosis not present

## 2021-05-13 DIAGNOSIS — R140 Abdominal distension (gaseous): Secondary | ICD-10-CM | POA: Diagnosis not present

## 2021-05-13 LAB — BASIC METABOLIC PANEL
Anion gap: 13 (ref 5–15)
BUN: 53 mg/dL — ABNORMAL HIGH (ref 8–23)
CO2: 21 mmol/L — ABNORMAL LOW (ref 22–32)
Calcium: 8.6 mg/dL — ABNORMAL LOW (ref 8.9–10.3)
Chloride: 100 mmol/L (ref 98–111)
Creatinine, Ser: 0.34 mg/dL — ABNORMAL LOW (ref 0.61–1.24)
GFR, Estimated: >60 mL/min (ref >60)
Glucose, Bld: 115 mg/dL — ABNORMAL HIGH (ref 70–99)
Potassium: 5.1 mmol/L (ref 3.5–5.1)
Sodium: 134 mmol/L — ABNORMAL LOW (ref 135–145)

## 2021-05-13 MED ORDER — ORAL CARE MOUTH RINSE
15.0000 mL | Freq: Four times a day (QID) | OROMUCOSAL | Status: DC
  Administered 2021-05-14 – 2021-05-22 (×33): 15 mL via OROMUCOSAL

## 2021-05-13 NOTE — TOC Progression Note (Signed)
Transition of Care [facility]) - Progression Note    Patient Details  Name: Andrew Rivera MRN: [id number] Date of Birth: 06/04/1951  Transition of Care [facility]) CM/SW Contact  Epifanio Lesches, RN Phone Number: 05/13/2021, 2:20 PM  Clinical Narrative:    Per [facility] [id number]), [facility] missed peer to peer allotted time. Expedited appeal request in process for LTAC.  TOC team monitoring and will assist with TOC needs.   Expected Discharge Plan: Long Term Acute Care (LTAC) Barriers to Discharge: Insurance Authorization  Expected Discharge Plan and Services Expected Discharge Plan: Long Term Acute Care (LTAC)         Expected Discharge Date: 05/05/21                                     Social Determinants of Health (SDOH) Interventions    Readmission Risk Interventions No flowsheet data found.

## 2021-05-13 NOTE — Progress Notes (Signed)
PROGRESS NOTE  Andrew Rivera [id number]:[id number] DOB: [birth year] DOA: 04/23/2021 PCP: Larena Glassman, MD   LOS: 19 days   Brief Narrative / Interim history: 70 year old male with chronic trach/chronic vent at [facility] - vent side, not LTAC, history of constipation chronic irritation, prior pneumonia, critical illness myopathy functional quadriplegia, comes into the hospital with abdominal distention.  CT on admission showed colonic distention/ileus without obstruction or perforation.  GI consulted, he was placed on aggressive bowel regimen with resolution of his ileus.  GI signed off and eventually was transferred to the hospitalist service.  Patient had an event of hypotension and fever on 7/8, he was briefly on antibiotics but all cultures were negative and has been monitored off antibiotics since 7/10.  He has had no further issues, awaiting placement back  Subjective / 24h Interval events: Awake, alert, mouthing words appropriately.  No abdominal pain, no nausea or vomiting.  Had a bowel movement yesterday  Assessment & Plan: Principal Problem Colonic distention/ileus-he has a history of the same, he was admitted to the hospital and GI was consulted.  This was likely opioid induced, he had a movie prep with resolution of his symptoms.  Changed PRN Senokot and MiraLAX to scheduled on 7/21 as he has not had a bowel movement in 2 days. -Started having bowel movements again -Stable to discharge pending bed availability/insurance authorization  Active Problems Isolated fever 7/8-no clear source, all cultures have remained negative.  Remained stable off antibiotics  Functional quadriplegia, chronic respiratory failure, tracheostomy and vent dependent, PEG tube in place-all stable at this point  Hypokalemia/hypomagnesemia-continue to monitor potassium  Anemia of chronic disease-no bleeding, hemoglobin overall stable  Chronic low back pain, neuropathy, mood disorder-continue home medications,  Cymbalta, Ativan, Lyrica as well as narcotics.   Scheduled Meds:  acetaminophen (TYLENOL) oral liquid 160 mg/5 mL  960 mg Per Tube Q8H   doxazosin  2 mg Per Tube Daily   DULoxetine  30 mg Oral BID   enoxaparin (LOVENOX) injection  40 mg Subcutaneous Q24H   feeding supplement (PROSource TF)  45 mL Per Tube TID   fentaNYL  1 patch Transdermal Q72H   free water  100 mL Per Tube Q4H   lisinopril  5 mg Per Tube Daily   mouth rinse  15 mL Mouth Rinse 10 times per day   melatonin  3 mg Per Tube QHS   pantoprazole sodium  40 mg Per Tube Daily   polyethylene glycol  17 g Per Tube BID   pregabalin  100 mg Per Tube TID   sennosides  10 mL Per Tube BID   sodium chloride flush  10-40 mL Intracatheter Q12H   Continuous Infusions:  sodium chloride Stopped (05/02/21 0501)   feeding supplement (JEVITY 1.5 CAL/FIBER) 1,000 mL (05/11/21 1416)   PRN Meds:.HYDROmorphone, hydrOXYzine, ipratropium-albuterol, LORazepam, ondansetron (ZOFRAN) IV, sodium chloride flush  Diet Orders (From admission, onward)     Start     Ordered   04/24/21 0057  Diet NPO time specified  Diet effective now        04/24/21 0101            DVT prophylaxis: enoxaparin (LOVENOX) injection 40 mg Start: 04/30/21 1200 Place and maintain sequential compression device Start: 04/30/21 0733 SCDs Start: 04/24/21 0054     Code Status: Full Code  Family Communication: No family at bedside  Status is: Inpatient  Remains inpatient appropriate because:Inpatient level of care appropriate due to severity of illness  Dispo: The patient  is from: SNF              Anticipated d/c is to: SNF              Patient currently is medically stable to d/c.   Difficult to place patient No   Level of care: ICU  Consultants:  PCCM  Procedures:  None   Microbiology  None   Antimicrobials: none    Objective: Vitals:   05/13/21 0900 05/13/21 1000 05/13/21 1116 05/13/21 1128  BP: 137/61 (!) 139/59 (!) 148/57   Pulse: 81 78  88   Resp: 20 18 17    Temp:    98.5 F (36.9 C)  TempSrc:    Oral  SpO2: 96% 97% 99%   Weight:      Height:        Intake/Output Summary (Last 24 hours) at 05/13/2021 1328 Last data filed at 05/13/2021 1257 Gross per 24 hour  Intake 1570 ml  Output 1330 ml  Net 240 ml    Filed Weights   05/11/21 0341 05/12/21 0355 05/13/21 0413  Weight: 87.8 kg 87.6 kg 88.5 kg    Examination:  Constitutional: NAD Respiratory: CTA Cardiovascular: RRR  Data Reviewed: I have independently reviewed following labs and imaging studies   CBC: Recent Labs  Lab 05/07/21 0544  HGB 8.9*  HCT 26.8*    Basic Metabolic Panel: Recent Labs  Lab 05/07/21 0544 05/08/21 1651 05/11/21 0003 05/13/21 0728  NA 137  --  133* 134*  K 4.4  --  5.1 5.1  CL 108  --  102 100  CO2 26  --  24 21*  GLUCOSE 129*  --  101* 115*  BUN 24*  --  40* 53*  CREATININE <0.30* <0.30* <0.30* 0.34*  CALCIUM 8.5*  --  8.3* 8.6*  MG 2.1  --  2.4  --   PHOS 4.2  --   --   --     Liver Function Tests: Recent Labs  Lab 05/07/21 0544  ALBUMIN 2.2*    Coagulation Profile: No results for input(s): INR, PROTIME in the last 168 hours. HbA1C: No results for input(s): HGBA1C in the last 72 hours. CBG: Recent Labs  Lab 05/07/21 0012 05/07/21 0332 05/07/21 0718 05/07/21 1202 05/09/21 1109  GLUCAP 103* 103* 106* 101* 106*     No results found for this or any previous visit (from the past 240 hour(s)).   Radiology Studies: No results found.   05/11/21, MD, PhD [facility]  Between 7 am - 7 pm I am available, please contact me via Amion (for emergencies) or Securechat (non urgent messages)  Between 7 pm - 7 am I am not available, please contact night coverage MD/APP via Amion

## 2021-05-13 NOTE — Progress Notes (Signed)
   NAME:  Andrew Rivera, MRN:  [id number], DOB:  Mar 30, 1951, LOS: 19 ADMISSION DATE:  04/23/2021, CONSULTATION DATE:  04/24/21 REFERRING MD:  ED, CHIEF COMPLAINT:  abdominal pain   History of Present Illness:  70 yo male former smoker with hx of PNA and critical illness myopathy presented with abdominal distention and pain.  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   Pertinent  Medical History  Chronic pain, IJ thromboembolism, Septic shock, Pneumonia, Constipation with ileus, Critical illness myopathy, MSSA Bacteremia in November 2020, Pressure wounds  Significant Hospital Events: Including procedures, antibiotic start and stop dates in addition to other pertinent events   7/02 Admit 7/08 fever 101.7, start vancomycin/cefepime 7/12 trach collar trial did not last long 7/14 Ileus resolved, tolerating PSV weans 7/17 Routine trach change #6 cuffed shiley  Interim History / Subjective:   Remains on vent. No acute issues Placed on TC today and within minutes asked to be put back on vent due to dyspnea  Objective   Blood pressure (!) 106/46, pulse 74, temperature 99.1 F (37.3 C), temperature source Oral, resp. rate 16, height 5\' 8"  (1.727 m), weight 88.5 kg, SpO2 97 %.    Vent Mode: PRVC FiO2 (%):  [28 %] 28 % Set Rate:  [16 bmp] 16 bmp Vt Set:  [550 mL] 550 mL PEEP:  [5 cmH20] 5 cmH20 Pressure Support:  [10 cmH20] 10 cmH20 Plateau Pressure:  [15 cmH20-16 cmH20] 15 cmH20   Intake/Output Summary (Last 24 hours) at 05/13/2021 05/15/2021 Last data filed at 05/13/2021 0600 Gross per 24 hour  Intake 1880 ml  Output 1005 ml  Net 875 ml   Filed Weights   05/11/21 0341 05/12/21 0355 05/13/21 0413  Weight: 87.8 kg 87.6 kg 88.5 kg    Examination: Gen:      No acute distress, chronically ill appearing HEENT:  EOMI, sclera anicteric Neck:     No masses; no thyromegaly, Trach Lungs:    Clear to auscultation  bilaterally; normal respiratory effort CV:         Regular rate and rhythm; no murmurs Abd:      + bowel sounds; soft, non-tender; no palpable masses, no distension Ext:    No edema; adequate peripheral perfusion Skin:      Warm and dry; no rash Neuro: Awake, responsive  Resolved Hospital Problem list   Colonic Ileus Hypokalemia  Assessment & Plan:  Chronic hypoxic respiratory failure in setting of critical illness myopathy, along with 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 Tracheostomy and ventilator dependence. Continue PSV weans as tolerated Attempt to progress to trach collar during the day but has not made much progress He will continue to need vent support at night Intermittent CXR Will need outpatient neurology for EMG when stable to leave  DM type 2 Anemia of critical illness and chronic disease Mood disorder with insomnia Chronic pain Per primary team  Signature:    05/15/21 MD [facility] See Amion for pager  If no response to pager , please call [phone] until 7pm After 7:00 pm call Elink  [phone] 05/13/2021, 7:45 AM

## 2021-05-13 NOTE — TOC Progression Note (Signed)
Transition of Care [facility]) - Progression Note    Patient Details  Name: Andrew Rivera MRN: [id number] Date of Birth: September 27, 1951  Transition of Care [facility]) CM/SW Contact  Epifanio Lesches, RN Phone Number: 05/13/2021, 11:49 AM  Clinical Narrative:    NCM f/u with [facility] ([phone]) regarding insurance authorization status. Irving Burton informed NCM determination still pending , will f/u with NCM once received.  TOC team will continue to follow and assist with TOC needs....  Expected Discharge Plan: Long Term Acute Care (LTAC) Barriers to Discharge: Insurance Authorization  Expected Discharge Plan and Services Expected Discharge Plan: Long Term Acute Care (LTAC)         Expected Discharge Date: 05/05/21                                     Social Determinants of Health (SDOH) Interventions    Readmission Risk Interventions No flowsheet data found.

## 2021-05-14 DIAGNOSIS — R140 Abdominal distension (gaseous): Secondary | ICD-10-CM | POA: Diagnosis not present

## 2021-05-14 DIAGNOSIS — K567 Ileus, unspecified: Secondary | ICD-10-CM | POA: Diagnosis not present

## 2021-05-14 DIAGNOSIS — J9611 Chronic respiratory failure with hypoxia: Secondary | ICD-10-CM | POA: Diagnosis not present

## 2021-05-14 LAB — CREATININE, SERUM: Creatinine, Ser: <0.3 mg/dL — ABNORMAL LOW (ref 0.61–1.24)

## 2021-05-14 MED ORDER — WHITE PETROLATUM EX OINT
TOPICAL_OINTMENT | CUTANEOUS | Status: AC
  Filled 2021-05-14: qty 28.35

## 2021-05-14 NOTE — Progress Notes (Signed)
RT placed pt on ATC 60%. Pt immediately dropped O2 sats in the low 80's and started asking to be put back on vent. Pt stated he felt like he was having trouble breathing. RT will continue to monitor.

## 2021-05-14 NOTE — Plan of Care (Signed)

## 2021-05-14 NOTE — Progress Notes (Signed)
PROGRESS NOTE    Andrew Rivera  KZS:[id number] DOB: Feb 25, 1951 DOA: 04/23/2021 PCP: Larena Glassman, MD   Brief Narrative:  70 year old male with chronic trach/chronic vent at [facility] - vent side, not LTAC, history of constipation chronic irritation, prior pneumonia, critical illness myopathy functional quadriplegia, comes into the hospital with abdominal distention.  CT on admission showed colonic distention/ileus without obstruction or perforation.  GI consulted, he was placed on aggressive bowel regimen with resolution of his ileus.  GI signed off and eventually was transferred to the hospitalist service.  Patient had an event of hypotension and fever on 7/8, he was briefly on antibiotics but all cultures were negative and has been monitored off antibiotics since 7/10.  He has had no further issues, awaiting placement back   Assessment & Plan:   Active Problems:   Ileus (HCC)   Abdominal distension   Constipation   Hypokalemia   Chronic respiratory failure with hypoxia [facility])  Colonic distention/ileus-he has a history of the same, he was admitted to the hospital and GI was consulted.  This was likely opioid induced, he had a movie prep with resolution of his symptoms.  Changed PRN Senokot and MiraLAX to scheduled on 7/21 as he has not had a bowel movement in 2 days. -Started having bowel movements again -Stable to discharge pending bed availability/insurance authorization   Isolated fever 7/8-no clear source, all cultures have remained negative.  Remained stable off antibiotics   Functional quadriplegia, chronic respiratory failure, tracheostomy and vent dependent, PEG tube in place-all stable at this point. Will order IS/Flutter.    Hypokalemia/hypomagnesemia-continue to monitor potassium   Anemia of chronic disease-no bleeding, hemoglobin overall stable   Chronic low back pain, neuropathy, mood disorder-continue home medications, Cymbalta, Ativan, Lyrica as well as narcotics.        DVT prophylaxis: enoxaparin (LOVENOX) injection 40 mg Start: 04/30/21 1200 Place and maintain sequential compression device Start: 04/30/21 0733 SCDs Start: 04/24/21 0054  Code Status: Full Family Communication: Wife at bedside  Status is: Inpatient  Remains inpatient appropriate because:Inpatient level of care appropriate due to severity of illness  Dispo: The patient is from: LTAC              Anticipated d/c is to: LTAC              Patient currently is medically stable to d/c.   Difficult to place patient No       Nutritional status  Nutrition Problem: Inadequate oral intake Etiology: inability to eat  Signs/Symptoms: NPO status  Interventions: Tube feeding  Body mass index is 29.67 kg/m.  Pressure Injury 01/27/20 Buttocks Right Stage 2 -  Partial thickness loss of dermis presenting as a shallow open injury with a red, pink wound bed without slough. (Active)  01/27/20 0115  Location: Buttocks  Location Orientation: Right  Staging: Stage 2 -  Partial thickness loss of dermis presenting as a shallow open injury with a red, pink wound bed without slough.  Wound Description (Comments):   Present on Admission: Yes     Pressure Injury 01/27/20 Back Left Deep Tissue Pressure Injury - Purple or maroon localized area of discolored intact skin or blood-filled blister due to damage of underlying soft tissue from pressure and/or shear. (Active)  01/27/20 0115  Location: Back  Location Orientation: Left  Staging: Deep Tissue Pressure Injury - Purple or maroon localized area of discolored intact skin or blood-filled blister due to damage of underlying soft tissue from pressure and/or  shear.  Wound Description (Comments):   Present on Admission: Yes          Subjective: No complaints this morning, no acute events overnight.  Doing well.  Review of Systems Otherwise negative except as per HPI, including: General: Denies fever, chills, night sweats or  unintended weight loss. Resp: Denies cough, wheezing, shortness of breath. Cardiac: Denies chest pain, palpitations, orthopnea, paroxysmal nocturnal dyspnea. GI: Denies abdominal pain, nausea, vomiting, diarrhea or constipation GU: Denies dysuria, frequency, hesitancy or incontinence MS: Denies muscle aches, joint pain or swelling Neuro: Denies headache, neurologic deficits (focal weakness, numbness, tingling), abnormal gait Psych: Denies anxiety, depression, SI/HI/AVH Skin: Denies new rashes or lesions ID: Denies sick contacts, exotic exposures, travel  Examination:  General exam: Appears calm and comfortable, trach in place Respiratory system: Clear to auscultation. Respiratory effort normal. Cardiovascular system: S1 & S2 heard, RRR. No JVD, murmurs, rubs, gallops or clicks. No pedal edema. Gastrointestinal system: Abdomen is nondistended, soft and nontender. No organomegaly or masses felt. Normal bowel sounds heard. Central nervous system: Alert and oriented. No focal neurological deficits. Extremities: Symmetric 5 x 5 power. Skin: No rashes, lesions or ulcers Psychiatry: Judgement and insight appear normal. Mood & affect appropriate.     Objective: Vitals:   05/14/21 1000 05/14/21 1100 05/14/21 1103 05/14/21 1128  BP: (!) 165/57 (!) 162/52  (!) 162/52  Pulse: 73 68  67  Resp:    17  Temp:   98.4 F (36.9 C)   TempSrc:   Oral   SpO2:    99%  Weight:      Height:        Intake/Output Summary (Last 24 hours) at 05/14/2021 1211 Last data filed at 05/14/2021 1000 Gross per 24 hour  Intake 1775 ml  Output 2100 ml  Net -325 ml   Filed Weights   05/11/21 0341 05/12/21 0355 05/13/21 0413  Weight: 87.8 kg 87.6 kg 88.5 kg     Data Reviewed:   CBC: No results for input(s): WBC, NEUTROABS, HGB, HCT, MCV, PLT in the last 168 hours. Basic Metabolic Panel: Recent Labs  Lab 05/08/21 1651 05/11/21 0003 05/13/21 0728 05/14/21 0553  NA  --  133* 134*  --   K  --  5.1 5.1   --   CL  --  102 100  --   CO2  --  24 21*  --   GLUCOSE  --  101* 115*  --   BUN  --  40* 53*  --   CREATININE <0.30* <0.30* 0.34* <0.30*  CALCIUM  --  8.3* 8.6*  --   MG  --  2.4  --   --    GFR: CrCl cannot be calculated (This lab value cannot be used to calculate CrCl because it is not a number: <0.30). Liver Function Tests: No results for input(s): AST, ALT, ALKPHOS, BILITOT, PROT, ALBUMIN in the last 168 hours. No results for input(s): LIPASE, AMYLASE in the last 168 hours. No results for input(s): AMMONIA in the last 168 hours. Coagulation Profile: No results for input(s): INR, PROTIME in the last 168 hours. Cardiac Enzymes: No results for input(s): CKTOTAL, CKMB, CKMBINDEX, TROPONINI in the last 168 hours. BNP (last 3 results) No results for input(s): PROBNP in the last 8760 hours. HbA1C: No results for input(s): HGBA1C in the last 72 hours. CBG: Recent Labs  Lab 05/09/21 1109  GLUCAP 106*   Lipid Profile: No results for input(s): CHOL, HDL, LDLCALC, TRIG, CHOLHDL, LDLDIRECT in  the last 72 hours. Thyroid Function Tests: No results for input(s): TSH, T4TOTAL, FREET4, T3FREE, THYROIDAB in the last 72 hours. Anemia Panel: No results for input(s): VITAMINB12, FOLATE, FERRITIN, TIBC, IRON, RETICCTPCT in the last 72 hours. Sepsis Labs: No results for input(s): PROCALCITON, LATICACIDVEN in the last 168 hours.  No results found for this or any previous visit (from the past 240 hour(s)).       Radiology Studies: No results found.      Scheduled Meds:  acetaminophen (TYLENOL) oral liquid 160 mg/5 mL  960 mg Per Tube Q8H   doxazosin  2 mg Per Tube Daily   DULoxetine  30 mg Oral BID   enoxaparin (LOVENOX) injection  40 mg Subcutaneous Q24H   feeding supplement (PROSource TF)  45 mL Per Tube TID   fentaNYL  1 patch Transdermal Q72H   free water  100 mL Per Tube Q4H   lisinopril  5 mg Per Tube Daily   mouth rinse  15 mL Mouth Rinse QID   melatonin  3 mg Per  Tube QHS   pantoprazole sodium  40 mg Per Tube Daily   polyethylene glycol  17 g Per Tube BID   pregabalin  100 mg Per Tube TID   sennosides  10 mL Per Tube BID   sodium chloride flush  10-40 mL Intracatheter Q12H   white petrolatum       Continuous Infusions:  sodium chloride Stopped (05/02/21 0501)   feeding supplement (JEVITY 1.5 CAL/FIBER) 1,000 mL (05/14/21 0930)     LOS: 20 days   Time spent= 35 mins    Contessa Preuss Joline Maxcy, MD [facility]  If 7PM-7AM, please contact night-coverage  05/14/2021, 12:11 PM

## 2021-05-15 DIAGNOSIS — K567 Ileus, unspecified: Secondary | ICD-10-CM | POA: Diagnosis not present

## 2021-05-15 DIAGNOSIS — J9611 Chronic respiratory failure with hypoxia: Secondary | ICD-10-CM | POA: Diagnosis not present

## 2021-05-15 DIAGNOSIS — R140 Abdominal distension (gaseous): Secondary | ICD-10-CM | POA: Diagnosis not present

## 2021-05-15 LAB — BASIC METABOLIC PANEL
Anion gap: 10 (ref 5–15)
BUN: 39 mg/dL — ABNORMAL HIGH (ref 8–23)
CO2: 23 mmol/L (ref 22–32)
Calcium: 8.8 mg/dL — ABNORMAL LOW (ref 8.9–10.3)
Chloride: 103 mmol/L (ref 98–111)
Creatinine, Ser: <0.3 mg/dL — ABNORMAL LOW (ref 0.61–1.24)
Glucose, Bld: 107 mg/dL — ABNORMAL HIGH (ref 70–99)
Potassium: 4.4 mmol/L (ref 3.5–5.1)
Sodium: 136 mmol/L (ref 135–145)

## 2021-05-15 NOTE — Progress Notes (Signed)
Attempted pt on ATC. Pt wanted to be put back on the vent. Very anxious. Pt placed back on vent at this time. RT will continue to monitor.

## 2021-05-15 NOTE — Progress Notes (Signed)
PROGRESS NOTE    Andrew Rivera  JAS:[id number] DOB: 1951-08-27 DOA: 04/23/2021 PCP: Larena Glassman, MD   Brief Narrative:  70 year old male with chronic trach/chronic vent at [facility] - vent side, not LTAC, history of constipation chronic irritation, prior pneumonia, critical illness myopathy functional quadriplegia, comes into the hospital with abdominal distention.  CT on admission showed colonic distention/ileus without obstruction or perforation.  GI consulted, he was placed on aggressive bowel regimen with resolution of his ileus.  GI signed off and eventually was transferred to the hospitalist service.  Patient had an event of hypotension and fever on 7/8, he was briefly on antibiotics but all cultures were negative and has been monitored off antibiotics since 7/10.  He has had no further issues, awaiting placement back   Assessment & Plan:   Active Problems:   Ileus (HCC)   Abdominal distension   Constipation   Hypokalemia   Chronic respiratory failure with hypoxia [facility])  Colonic distention/ileus-he has a history of the same, he was admitted to the hospital and GI was consulted.  This was likely opioid induced, he had a movie prep with resolution of his symptoms.  Changed PRN Senokot and MiraLAX to scheduled on 7/21 as he has not had a bowel movement in 2 days. -Started having bowel movements again -Stable to discharge pending bed availability/insurance authorization   Isolated fever 7/8-no clear source, all cultures have remained negative.  Remained stable off antibiotics   Functional quadriplegia, chronic respiratory failure, tracheostomy and vent dependent, PEG tube in place-all stable at this point.  Ordered I-S/flutter.   Hypokalemia/hypomagnesemia-continue to monitor potassium   Anemia of chronic disease-no bleeding, hemoglobin overall stable   Chronic low back pain, neuropathy, mood disorder-continue home medications, Cymbalta, Ativan, Lyrica as well as narcotics.        DVT prophylaxis: enoxaparin (LOVENOX) injection 40 mg Start: 04/30/21 1200 Place and maintain sequential compression device Start: 04/30/21 0733 SCDs Start: 04/24/21 0054  Code Status: Full Family Communication: None at bedside  Status is: Inpatient  Remains inpatient appropriate because:Inpatient level of care appropriate due to severity of illness  Dispo: The patient is from: LTAC              Anticipated d/c is to: LTAC              Patient currently is medically stable to d/c.   Difficult to place patient No       Nutritional status  Nutrition Problem: Inadequate oral intake Etiology: inability to eat  Signs/Symptoms: NPO status  Interventions: Tube feeding  Body mass index is 29.67 kg/m.  Pressure Injury 01/27/20 Buttocks Right Stage 2 -  Partial thickness loss of dermis presenting as a shallow open injury with a red, pink wound bed without slough. (Active)  01/27/20 0115  Location: Buttocks  Location Orientation: Right  Staging: Stage 2 -  Partial thickness loss of dermis presenting as a shallow open injury with a red, pink wound bed without slough.  Wound Description (Comments):   Present on Admission: Yes     Pressure Injury 01/27/20 Back Left Deep Tissue Pressure Injury - Purple or maroon localized area of discolored intact skin or blood-filled blister due to damage of underlying soft tissue from pressure and/or shear. (Active)  01/27/20 0115  Location: Back  Location Orientation: Left  Staging: Deep Tissue Pressure Injury - Purple or maroon localized area of discolored intact skin or blood-filled blister due to damage of underlying soft tissue from pressure and/or shear.  Wound Description (Comments):   Present on Admission: Yes          Subjective: Attempted trach collar yesterday but patient desatted within 5 minutes and became very anxious.  This morning he is resting well does not have any complaints.  Awaiting his return to  [facility] Review of Systems Otherwise negative except as per HPI, including: General: Denies fever, chills, night sweats or unintended weight loss. Resp: Denies cough, wheezing, shortness of breath. Cardiac: Denies chest pain, palpitations, orthopnea, paroxysmal nocturnal dyspnea. GI: Denies abdominal pain, nausea, vomiting, diarrhea or constipation GU: Denies dysuria, frequency, hesitancy or incontinence MS: Denies muscle aches, joint pain or swelling Neuro: Denies headache, neurologic deficits (focal weakness, numbness, tingling), abnormal gait Psych: Denies anxiety, depression, SI/HI/AVH Skin: Denies new rashes or lesions ID: Denies sick contacts, exotic exposures, travel  Examination: Constitutional: Not in acute distress Respiratory: Clear to auscultation bilaterally Cardiovascular: Normal sinus rhythm, no rubs Abdomen: Nontender nondistended good bowel sounds Musculoskeletal: No edema noted Skin: No rashes seen Neurologic: CN 2-12 grossly intact.  And nonfocal Psychiatric: Normal judgment and insight. Alert and oriented x 3. Normal mood.  Trach and PEG in place   Objective: Vitals:   05/15/21 0700 05/15/21 0752 05/15/21 0800 05/15/21 0810  BP: (!) 112/46 (!) 112/46 (!) 144/51   Pulse: 60 64 (!) 59   Resp: 16 16 16    Temp:    98.1 F (36.7 C)  TempSrc:    Oral  SpO2: 97% 100% 100%   Weight:      Height:        Intake/Output Summary (Last 24 hours) at 05/15/2021 0859 Last data filed at 05/15/2021 0700 Gross per 24 hour  Intake 1755 ml  Output 1400 ml  Net 355 ml   Filed Weights   05/11/21 0341 05/12/21 0355 05/13/21 0413  Weight: 87.8 kg 87.6 kg 88.5 kg     Data Reviewed:   CBC: No results for input(s): WBC, NEUTROABS, HGB, HCT, MCV, PLT in the last 168 hours. Basic Metabolic Panel: Recent Labs  Lab 05/08/21 1651 05/11/21 0003 05/13/21 0728 05/14/21 0553 05/15/21 0740  NA  --  133* 134*  --  136  K  --  5.1 5.1  --  4.4  CL  --  102 100  --  103  CO2   --  24 21*  --  23  GLUCOSE  --  101* 115*  --  107*  BUN  --  40* 53*  --  39*  CREATININE <0.30* <0.30* 0.34* <0.30* <0.30*  CALCIUM  --  8.3* 8.6*  --  8.8*  MG  --  2.4  --   --   --    GFR: CrCl cannot be calculated (This lab value cannot be used to calculate CrCl because it is not a number: <0.30). Liver Function Tests: No results for input(s): AST, ALT, ALKPHOS, BILITOT, PROT, ALBUMIN in the last 168 hours. No results for input(s): LIPASE, AMYLASE in the last 168 hours. No results for input(s): AMMONIA in the last 168 hours. Coagulation Profile: No results for input(s): INR, PROTIME in the last 168 hours. Cardiac Enzymes: No results for input(s): CKTOTAL, CKMB, CKMBINDEX, TROPONINI in the last 168 hours. BNP (last 3 results) No results for input(s): PROBNP in the last 8760 hours. HbA1C: No results for input(s): HGBA1C in the last 72 hours. CBG: Recent Labs  Lab 05/09/21 1109  GLUCAP 106*   Lipid Profile: No results for input(s): CHOL, HDL, LDLCALC, TRIG, CHOLHDL,  LDLDIRECT in the last 72 hours. Thyroid Function Tests: No results for input(s): TSH, T4TOTAL, FREET4, T3FREE, THYROIDAB in the last 72 hours. Anemia Panel: No results for input(s): VITAMINB12, FOLATE, FERRITIN, TIBC, IRON, RETICCTPCT in the last 72 hours. Sepsis Labs: No results for input(s): PROCALCITON, LATICACIDVEN in the last 168 hours.  No results found for this or any previous visit (from the past 240 hour(s)).       Radiology Studies: No results found.      Scheduled Meds:  acetaminophen (TYLENOL) oral liquid 160 mg/5 mL  960 mg Per Tube Q8H   doxazosin  2 mg Per Tube Daily   DULoxetine  30 mg Oral BID   enoxaparin (LOVENOX) injection  40 mg Subcutaneous Q24H   feeding supplement (PROSource TF)  45 mL Per Tube TID   fentaNYL  1 patch Transdermal Q72H   free water  100 mL Per Tube Q4H   lisinopril  5 mg Per Tube Daily   mouth rinse  15 mL Mouth Rinse QID   melatonin  3 mg Per Tube QHS    pantoprazole sodium  40 mg Per Tube Daily   polyethylene glycol  17 g Per Tube BID   pregabalin  100 mg Per Tube TID   sennosides  10 mL Per Tube BID   sodium chloride flush  10-40 mL Intracatheter Q12H   Continuous Infusions:  sodium chloride Stopped (05/02/21 0501)   feeding supplement (JEVITY 1.5 CAL/FIBER) 1,000 mL (05/15/21 0623)     LOS: 21 days   Time spent= 35 mins    Andrew Swaim Joline Maxcy, MD [facility]  If 7PM-7AM, please contact night-coverage  05/15/2021, 8:59 AM

## 2021-05-16 DIAGNOSIS — J9611 Chronic respiratory failure with hypoxia: Secondary | ICD-10-CM | POA: Diagnosis not present

## 2021-05-16 DIAGNOSIS — J9601 Acute respiratory failure with hypoxia: Secondary | ICD-10-CM | POA: Diagnosis not present

## 2021-05-16 DIAGNOSIS — K567 Ileus, unspecified: Secondary | ICD-10-CM | POA: Diagnosis not present

## 2021-05-16 DIAGNOSIS — K5903 Drug induced constipation: Secondary | ICD-10-CM | POA: Diagnosis not present

## 2021-05-16 DIAGNOSIS — R140 Abdominal distension (gaseous): Secondary | ICD-10-CM | POA: Diagnosis not present

## 2021-05-16 NOTE — Progress Notes (Signed)
PROGRESS NOTE    Andrew Rivera  XLK:[id number] DOB: [birth year] DOA: 04/23/2021 PCP: Larena Glassman, MD   Brief Narrative:  70 year old male with chronic trach/chronic vent at [facility] - vent side, not LTAC, history of constipation chronic irritation, prior pneumonia, critical illness myopathy functional quadriplegia, comes into the hospital with abdominal distention.  CT on admission showed colonic distention/ileus without obstruction or perforation.  GI consulted, he was placed on aggressive bowel regimen with resolution of his ileus.  GI signed off and eventually was transferred to the hospitalist service.  Patient had an event of hypotension and fever on 7/8, he was briefly on antibiotics but all cultures were negative and has been monitored off antibiotics since 7/10.  He has had no further issues, awaiting placement back   Assessment & Plan:   Active Problems:   Ileus (HCC)   Abdominal distension   Constipation   Hypokalemia   Chronic respiratory failure with hypoxia [facility])  Colonic distention/ileus-he has a history of the same, he was admitted to the hospital and GI was consulted.  This was likely opioid induced, he had a movie prep with resolution of his symptoms.  Changed PRN Senokot and MiraLAX to scheduled on 7/21 as he has not had a bowel movement in 2 days. -Started having bowel movements again -Stable to discharge pending bed availability/insurance authorization   Isolated fever 7/8-no clear source, all cultures have remained negative.  Remained stable off antibiotics   Functional quadriplegia, chronic respiratory failure, tracheostomy and vent dependent, PEG tube in place-all stable at this point.  Ordered I-S/flutter.   Hypokalemia/hypomagnesemia-continue to monitor potassium   Anemia of chronic disease-no bleeding, hemoglobin overall stable   Chronic low back pain, neuropathy, mood disorder-continue home medications, Cymbalta, Ativan, Lyrica as well as narcotics.    DVT prophylaxis: enoxaparin (LOVENOX) injection 40 mg Start: 04/30/21 1200 Place and maintain sequential compression device Start: 04/30/21 0733 SCDs Start: 04/24/21 0054  Code Status: Full Family Communication: Wife Updated.   Status is: Inpatient  Remains inpatient appropriate because:Inpatient level of care appropriate due to severity of illness  Dispo: The patient is from: LTAC              Anticipated d/c is to: LTAC              Patient currently is medically stable to d/c.   Difficult to place patient No    Nutritional status  Nutrition Problem: Inadequate oral intake Etiology: inability to eat  Signs/Symptoms: NPO status  Interventions: Tube feeding  Body mass index is 28.93 kg/m.  Pressure Injury 01/27/20 Buttocks Right Stage 2 -  Partial thickness loss of dermis presenting as a shallow open injury with a red, pink wound bed without slough. (Active)  01/27/20 0115  Location: Buttocks  Location Orientation: Right  Staging: Stage 2 -  Partial thickness loss of dermis presenting as a shallow open injury with a red, pink wound bed without slough.  Wound Description (Comments):   Present on Admission: Yes     Pressure Injury 01/27/20 Back Left Deep Tissue Pressure Injury - Purple or maroon localized area of discolored intact skin or blood-filled blister due to damage of underlying soft tissue from pressure and/or shear. (Active)  01/27/20 0115  Location: Back  Location Orientation: Left  Staging: Deep Tissue Pressure Injury - Purple or maroon localized area of discolored intact skin or blood-filled blister due to damage of underlying soft tissue from pressure and/or shear.  Wound Description (Comments):   Present  on Admission: Yes          Subjective: No complaints he is ok. No acute events overnight.   Review of Systems Otherwise negative except as per HPI, including: General = no fevers, chills, dizziness,  fatigue HEENT/EYES = negative for loss of  vision, double vision, blurred vision,  sore throa Cardiovascular= negative for chest pain, palpitation Respiratory/lungs= negative for shortness of breath, cough, wheezing; hemoptysis,  Gastrointestinal= negative for nausea, vomiting, abdominal pain Genitourinary= negative for Dysuria MSK = Negative for arthralgia, myalgias Neurology= Negative for headache, numbness, tingling  Psychiatry= Negative for suicidal and homocidal ideation Skin= Negative for Rash   Examination: Constitutional: Not in acute distress Respiratory: Clear to auscultation bilaterally Cardiovascular: Normal sinus rhythm, no rubs Abdomen: Nontender nondistended good bowel sounds Musculoskeletal: No edema noted Skin: No rashes seen Neurologic: CN 2-12 grossly intact.  And nonfocal Psychiatric: Normal judgment and insight. Alert and oriented x 3. Normal mood.   Trach and PEG in place   Objective: Vitals:   05/16/21 0800 05/16/21 0900 05/16/21 1000 05/16/21 1129  BP: (!) 144/54 (!) 142/55 (!) 169/57 (!) 143/62  Pulse: 70 75 88 88  Resp: 17 16 19 19   Temp: 98.3 F (36.8 C)     TempSrc: Oral     SpO2: 97% 97% 96% 100%  Weight:      Height:        Intake/Output Summary (Last 24 hours) at 05/16/2021 1142 Last data filed at 05/16/2021 1000 Gross per 24 hour  Intake 1255 ml  Output 1715 ml  Net -460 ml   Filed Weights   05/12/21 0355 05/13/21 0413 05/16/21 0600  Weight: 87.6 kg 88.5 kg 86.3 kg     Data Reviewed:   CBC: No results for input(s): WBC, NEUTROABS, HGB, HCT, MCV, PLT in the last 168 hours. Basic Metabolic Panel: Recent Labs  Lab 05/11/21 0003 05/13/21 0728 05/14/21 0553 05/15/21 0740  NA 133* 134*  --  136  K 5.1 5.1  --  4.4  CL 102 100  --  103  CO2 24 21*  --  23  GLUCOSE 101* 115*  --  107*  BUN 40* 53*  --  39*  CREATININE <0.30* 0.34* <0.30* <0.30*  CALCIUM 8.3* 8.6*  --  8.8*  MG 2.4  --   --   --    GFR: CrCl cannot be calculated (This lab value cannot be used to  calculate CrCl because it is not a number: <0.30). Liver Function Tests: No results for input(s): AST, ALT, ALKPHOS, BILITOT, PROT, ALBUMIN in the last 168 hours. No results for input(s): LIPASE, AMYLASE in the last 168 hours. No results for input(s): AMMONIA in the last 168 hours. Coagulation Profile: No results for input(s): INR, PROTIME in the last 168 hours. Cardiac Enzymes: No results for input(s): CKTOTAL, CKMB, CKMBINDEX, TROPONINI in the last 168 hours. BNP (last 3 results) No results for input(s): PROBNP in the last 8760 hours. HbA1C: No results for input(s): HGBA1C in the last 72 hours. CBG: No results for input(s): GLUCAP in the last 168 hours.  Lipid Profile: No results for input(s): CHOL, HDL, LDLCALC, TRIG, CHOLHDL, LDLDIRECT in the last 72 hours. Thyroid Function Tests: No results for input(s): TSH, T4TOTAL, FREET4, T3FREE, THYROIDAB in the last 72 hours. Anemia Panel: No results for input(s): VITAMINB12, FOLATE, FERRITIN, TIBC, IRON, RETICCTPCT in the last 72 hours. Sepsis Labs: No results for input(s): PROCALCITON, LATICACIDVEN in the last 168 hours.  No results found for  this or any previous visit (from the past 240 hour(s)).       Radiology Studies: No results found.      Scheduled Meds:  acetaminophen (TYLENOL) oral liquid 160 mg/5 mL  960 mg Per Tube Q8H   doxazosin  2 mg Per Tube Daily   DULoxetine  30 mg Oral BID   enoxaparin (LOVENOX) injection  40 mg Subcutaneous Q24H   feeding supplement (PROSource TF)  45 mL Per Tube TID   fentaNYL  1 patch Transdermal Q72H   free water  100 mL Per Tube Q4H   lisinopril  5 mg Per Tube Daily   mouth rinse  15 mL Mouth Rinse QID   melatonin  3 mg Per Tube QHS   pantoprazole sodium  40 mg Per Tube Daily   polyethylene glycol  17 g Per Tube BID   pregabalin  100 mg Per Tube TID   sennosides  10 mL Per Tube BID   sodium chloride flush  10-40 mL Intracatheter Q12H   Continuous Infusions:  sodium chloride  Stopped (05/02/21 0501)   feeding supplement (JEVITY 1.5 CAL/FIBER) 1,000 mL (05/16/21 0505)     LOS: 22 days   Time spent= 35 mins    Eastin Swing Joline Maxcy, MD [facility]  If 7PM-7AM, please contact night-coverage  05/16/2021, 11:42 AM

## 2021-05-16 NOTE — TOC Progression Note (Signed)
Transition of Care [facility]) - Progression Note    Patient Details  Name: Andrew Rivera MRN: [id number] Date of Birth: Dec 22, 1950  Transition of [facility]) CM/SW Contact  Ralene Bathe, LCSWA Phone Number: 05/16/2021, 3:00 PM  Clinical Narrative:    CSW was notified that the [facility] appeal was denied.  CSW contacted Prem with [facility] (vent SNF) to inquire about bed availability and there was no answer. CSW left VM requesting a returned call.     Expected Discharge Plan: Long Term Acute Care (LTAC) Barriers to Discharge: Insurance Authorization  Expected Discharge Plan and Services Expected Discharge Plan: Long Term Acute Care (LTAC)         Expected Discharge Date: 05/16/21                                     Social Determinants of Health (SDOH) Interventions    Readmission Risk Interventions No flowsheet data found.

## 2021-05-16 NOTE — Progress Notes (Signed)
Assisted tele visit to patient with wife.  Thomas, Tameem Pullara Renee, RN   

## 2021-05-16 NOTE — Progress Notes (Addendum)
NAME:  Andrew Rivera, MRN:  [id number], DOB:  07-16-1951, LOS: 22 ADMISSION DATE:  04/23/2021, CONSULTATION DATE:  04/24/21 REFERRING MD:  ED, CHIEF COMPLAINT:  abdominal pain   History of Present Illness:  70 y/o male, former smoker, with hx of PNA and critical illness myopathy presented with abdominal distention and pain.  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   Pertinent  Medical History  Chronic pain, IJ thromboembolism, Septic shock, Pneumonia, Constipation with ileus, Critical illness myopathy, MSSA Bacteremia in November 2020, Pressure wounds  Significant Hospital Events: Including procedures, antibiotic start and stop dates in addition to other pertinent events   7/02 Admit 7/08 fever 101.7, start vancomycin/cefepime 7/12 trach collar trial did not last long 7/14 Ileus resolved, tolerating PSV weans 7/17 Routine trach change #6 cuffed Shiley 7/22 ATC briefly but failed due to dyspnea  Interim History / Subjective:  Afebrile  Vent 28%, PEEP 5  Afebrile  I/O UOP 1.5L, -385 in last 24 hours Pt reports significant anxiety with ATC efforts.  Notes he was on the vent prior to coming in to the hospital.  Objective   Blood pressure (!) 142/55, pulse 75, temperature 98.3 F (36.8 C), temperature source Oral, resp. rate 16, height 5\' 8"  (1.727 m), weight 86.3 kg, SpO2 97 %.    Vent Mode: CPAP;PSV FiO2 (%):  [28 %-60 %] 28 % Set Rate:  [16 bmp] 16 bmp Vt Set:  [550 mL] 550 mL PEEP:  [5 cmH20] 5 cmH20 Pressure Support:  [10 cmH20] 10 cmH20 Plateau Pressure:  [15 cmH20-17 cmH20] 17 cmH20   Intake/Output Summary (Last 24 hours) at 05/16/2021 0955 Last data filed at 05/16/2021 0900 Gross per 24 hour  Intake 1310 ml  Output 1590 ml  Net -280 ml   Filed Weights   05/12/21 0355 05/13/21 0413 05/16/21 0600  Weight: 87.6 kg 88.5 kg 86.3 kg    Examination: General:  chronically ill appearing  adult male lying in bed in NAD HEENT: MM pink/moist, shiley cuffed trach midline c/d/I, anicteric Neuro: Awake, alert, communicates appropriately / oriented, has some motor movement of hands / feet spontaneously  CV: s1s2 RRR, no m/r/g PULM:  non-labored on vent, lungs bilaterally clear, tolerating PSV with Vt ~500's GI: soft, bsx4 hypoactive, protuberant Extremities: warm/dry, trace dependent edema  Skin: no rashes or lesions  Resolved Hospital Problem list   Colonic Ileus Hypokalemia  Assessment & Plan:   Chronic hypoxic respiratory failure in setting of critical illness myopathy, along with 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 Tracheostomy and ventilator dependence. -PRVC as rest mode -continue daily PSV.  He was previously on vent at [facility] as well. Did not wean to ATC at [facility] per patient.  -will need QHS vent support at night given overall weakness  -follow intermittent CXR  -will need EMG as outpatient with Neurology   DM type 2 Anemia of critical illness and chronic disease Mood disorder with insomnia Chronic pain -per TRH   ABD Pain/Distention in setting of Ileus -per TRH / GI   Signature:    05/18/21, MSN, APRN, NP-C, AGACNP-BC [facility] 05/16/2021, 9:55 AM   Please see [email or web address] for pager details.   From 7A-7P if no response, please call [phone] After hours, please call ELink [phone]   Attending:    Subjective: No acute events  Objective: Vitals:   05/16/21 1129 05/16/21 1200 05/16/21 1300 05/16/21  1400  BP: (!) 143/62 (!) 147/84 (!) 161/53 (!) 169/53  Pulse: 88 86 76 76  Resp: 19 19 16 17   Temp:  98.6 F (37 C)    TempSrc:  Oral    SpO2: 100% 96% 99% 99%  Weight:      Height:       Vent Mode: CPAP;PSV FiO2 (%):  [28 %] 28 % Set Rate:  [16 bmp] 16 bmp Vt Set:  [550 mL] 550 mL PEEP:  [5 cmH20] 5 cmH20 Pressure Support:  [10 cmH20] 10 cmH20 Plateau Pressure:  [15  cmH20-17 cmH20] 17 cmH20  Intake/Output Summary (Last 24 hours) at 05/16/2021 1512 Last data filed at 05/16/2021 1400 Gross per 24 hour  Intake 1355 ml  Output 2045 ml  Net -690 ml    General:  Resting comfortably in bed on ent HENT: NCAT tracheostomy intact, no drainage PULM: CTA B, normal effort with ventilator support CV: RRR, no mgr GI: BS+, soft, nontender MSK: normal bulk and tone Neuro: awake, alert, no distress, MAEW    CBC    Component Value Date/Time   WBC 5.5 05/05/2021 0405   RBC 2.65 (L) 05/05/2021 0405   HGB 8.9 (L) 05/07/2021 0544   HCT 26.8 (L) 05/07/2021 0544   PLT 238 05/05/2021 0405   MCV 95.5 05/05/2021 0405   MCH 29.8 05/05/2021 0405   MCHC 31.2 05/05/2021 0405   RDW 14.6 05/05/2021 0405   LYMPHSABS 1.3 04/26/2021 0347   MONOABS 0.7 04/26/2021 0347   EOSABS 0.3 04/26/2021 0347   BASOSABS 0.0 04/26/2021 0347    BMET    Component Value Date/Time   NA 136 05/15/2021 0740   K 4.4 05/15/2021 0740   CL 103 05/15/2021 0740   CO2 23 05/15/2021 0740   GLUCOSE 107 (H) 05/15/2021 0740   BUN 39 (H) 05/15/2021 0740   CREATININE <0.30 (L) 05/15/2021 0740   CALCIUM 8.8 (L) 05/15/2021 0740   GFRNONAA NOT CALCULATED 05/15/2021 0740   GFRAA >60 01/27/2020 0229    CXR images 7/8 small R pleural effusion and atelectasis, tracheostomy in place  Impression/Plan: Chronic respiratory failure with hypoxemia  Tracheostomy status Continue pressure support during daytime and full support at night Trach care per routine No need to try ATC here Back to [facility] when bed available and medically ready No further plans for changing ventilator management this admission  Ileus Per TRH  My cc time n/a minutes  03/28/2020, MD [place] [facility] Pager: [phone] Cell: [phone] After 7pm: [phone]

## 2021-05-17 DIAGNOSIS — K5903 Drug induced constipation: Secondary | ICD-10-CM | POA: Diagnosis not present

## 2021-05-17 DIAGNOSIS — J9611 Chronic respiratory failure with hypoxia: Secondary | ICD-10-CM | POA: Diagnosis not present

## 2021-05-17 DIAGNOSIS — K567 Ileus, unspecified: Secondary | ICD-10-CM | POA: Diagnosis not present

## 2021-05-17 DIAGNOSIS — R140 Abdominal distension (gaseous): Secondary | ICD-10-CM | POA: Diagnosis not present

## 2021-05-17 NOTE — TOC Progression Note (Signed)
Transition of Care [facility]) - Progression Note    Patient Details  Name: Andrew Rivera MRN: [id number] Date of Birth: 1950/11/22  Transition of Care [facility]) CM/SW Contact  Ralene Bathe, LCSWA Phone Number: 05/17/2021, 10:10 AM  Clinical Narrative:    10:10- CSW attempted to contact Prem with [facility] to inquire about the availability of [facility] beds.  There was no answer.  CSW left a VM requesting a returned call.    10:11-  CSW  spoke with the patient's wife, [name], and updated her on the status of the availability of Vent SNFs.  The patient's wife stated that she has not heard anything from the facility.  CSW gauged the wife's openness to sending the patient's referral to another facility.  The patient's wife reports that any other facility would be too far for her to drive and she wants the patient at [facility].     Expected Discharge Plan: Long Term Acute Care (LTAC) Barriers to Discharge: Insurance Authorization  Expected Discharge Plan and Services Expected Discharge Plan: Long Term Acute Care (LTAC)         Expected Discharge Date: 05/17/21                                     Social Determinants of Health (SDOH) Interventions    Readmission Risk Interventions No flowsheet data found.

## 2021-05-17 NOTE — Progress Notes (Signed)
PROGRESS NOTE    Andrew Rivera  YSA:[id number] DOB: [birth year] DOA: 04/23/2021 PCP: Larena Glassman, MD   Brief Narrative:  70 year old male with chronic trach/chronic vent at [facility] - vent side, not LTAC, history of constipation chronic irritation, prior pneumonia, critical illness myopathy functional quadriplegia, comes into the hospital with abdominal distention.  CT on admission showed colonic distention/ileus without obstruction or perforation.  GI consulted, he was placed on aggressive bowel regimen with resolution of his ileus.  GI signed off and eventually was transferred to the hospitalist service.  Patient had an event of hypotension and fever on 7/8, he was briefly on antibiotics but all cultures were negative and has been monitored off antibiotics since 7/10.  He has had no further issues, awaiting placement back   Assessment & Plan:   Active Problems:   Ileus (HCC)   Abdominal distension   Constipation   Hypokalemia   Chronic respiratory failure with hypoxia [facility])  Colonic distention/ileus-he has a history of the same, he was admitted to the hospital and GI was consulted.  This was likely opioid induced, he had a movie prep with resolution of his symptoms.  Changed PRN Senokot and MiraLAX to scheduled on 7/21 as he has not had a bowel movement in 2 days. -Started having bowel movements again -Stable for discharge, TOC working on placement   Isolated fever 7/8-no clear source, all cultures have remained negative.  Remained stable off antibiotics   Functional quadriplegia, chronic respiratory failure, tracheostomy and vent dependent, PEG tube in place-all stable at this point.  Ordered I-S/flutter.   Hypokalemia/hypomagnesemia-continue to monitor potassium   Anemia of chronic disease-no bleeding, hemoglobin overall stable   Chronic low back pain, neuropathy, mood disorder-continue home medications, Cymbalta, Ativan, Lyrica as well as narcotics.   DVT prophylaxis:  enoxaparin (LOVENOX) injection 40 mg Start: 04/30/21 1200 Place and maintain sequential compression device Start: 04/30/21 0733 SCDs Start: 04/24/21 0054  Code Status: Full Family Communication: Wife updated periodically  Status is: Inpatient  Remains inpatient appropriate because:Inpatient level of care appropriate due to severity of illness  Dispo: The patient is from: LTAC              Anticipated d/c is to: LTAC              Patient currently is medically stable to d/c.   Difficult to place patient No    Nutritional status  Nutrition Problem: Inadequate oral intake Etiology: inability to eat  Signs/Symptoms: NPO status  Interventions: Tube feeding  Body mass index is 29.16 kg/m.  Pressure Injury 01/27/20 Buttocks Right Stage 2 -  Partial thickness loss of dermis presenting as a shallow open injury with a red, pink wound bed without slough. (Active)  01/27/20 0115  Location: Buttocks  Location Orientation: Right  Staging: Stage 2 -  Partial thickness loss of dermis presenting as a shallow open injury with a red, pink wound bed without slough.  Wound Description (Comments):   Present on Admission: Yes     Pressure Injury 01/27/20 Back Left Deep Tissue Pressure Injury - Purple or maroon localized area of discolored intact skin or blood-filled blister due to damage of underlying soft tissue from pressure and/or shear. (Active)  01/27/20 0115  Location: Back  Location Orientation: Left  Staging: Deep Tissue Pressure Injury - Purple or maroon localized area of discolored intact skin or blood-filled blister due to damage of underlying soft tissue from pressure and/or shear.  Wound Description (Comments):   Present  on Admission: Yes          Subjective: No acute events overnight, no complaints   Examination: Constitutional: Not in acute distress Respiratory: Clear to auscultation bilaterally Cardiovascular: Normal sinus rhythm, no rubs Abdomen: Nontender  nondistended good bowel sounds Musculoskeletal: No edema noted Skin: No rashes seen Neurologic: CN 2-12 grossly intact.  And nonfocal Psychiatric: Normal judgment and insight. Alert and oriented x 3. Normal mood.  Trach and PEG in place   Objective: Vitals:   05/17/21 0400 05/17/21 0740 05/17/21 0747 05/17/21 0912  BP: (!) 116/49 (!) 136/50  (!) 156/50  Pulse: 68 72    Resp: 16 13    Temp:   98.1 F (36.7 C)   TempSrc:   Oral   SpO2: 96% 96%    Weight:      Height:        Intake/Output Summary (Last 24 hours) at 05/17/2021 1109 Last data filed at 05/17/2021 0926 Gross per 24 hour  Intake 1300 ml  Output 1585 ml  Net -285 ml   Filed Weights   05/13/21 0413 05/16/21 0600 05/17/21 0347  Weight: 88.5 kg 86.3 kg 87 kg     Data Reviewed:   CBC: No results for input(s): WBC, NEUTROABS, HGB, HCT, MCV, PLT in the last 168 hours. Basic Metabolic Panel: Recent Labs  Lab 05/11/21 0003 05/13/21 0728 05/14/21 0553 05/15/21 0740  NA 133* 134*  --  136  K 5.1 5.1  --  4.4  CL 102 100  --  103  CO2 24 21*  --  23  GLUCOSE 101* 115*  --  107*  BUN 40* 53*  --  39*  CREATININE <0.30* 0.34* <0.30* <0.30*  CALCIUM 8.3* 8.6*  --  8.8*  MG 2.4  --   --   --    GFR: CrCl cannot be calculated (This lab value cannot be used to calculate CrCl because it is not a number: <0.30). Liver Function Tests: No results for input(s): AST, ALT, ALKPHOS, BILITOT, PROT, ALBUMIN in the last 168 hours. No results for input(s): LIPASE, AMYLASE in the last 168 hours. No results for input(s): AMMONIA in the last 168 hours. Coagulation Profile: No results for input(s): INR, PROTIME in the last 168 hours. Cardiac Enzymes: No results for input(s): CKTOTAL, CKMB, CKMBINDEX, TROPONINI in the last 168 hours. BNP (last 3 results) No results for input(s): PROBNP in the last 8760 hours. HbA1C: No results for input(s): HGBA1C in the last 72 hours. CBG: No results for input(s): GLUCAP in the last 168  hours.  Lipid Profile: No results for input(s): CHOL, HDL, LDLCALC, TRIG, CHOLHDL, LDLDIRECT in the last 72 hours. Thyroid Function Tests: No results for input(s): TSH, T4TOTAL, FREET4, T3FREE, THYROIDAB in the last 72 hours. Anemia Panel: No results for input(s): VITAMINB12, FOLATE, FERRITIN, TIBC, IRON, RETICCTPCT in the last 72 hours. Sepsis Labs: No results for input(s): PROCALCITON, LATICACIDVEN in the last 168 hours.  No results found for this or any previous visit (from the past 240 hour(s)).       Radiology Studies: No results found.      Scheduled Meds:  acetaminophen (TYLENOL) oral liquid 160 mg/5 mL  960 mg Per Tube Q8H   doxazosin  2 mg Per Tube Daily   DULoxetine  30 mg Oral BID   enoxaparin (LOVENOX) injection  40 mg Subcutaneous Q24H   feeding supplement (PROSource TF)  45 mL Per Tube TID   fentaNYL  1 patch Transdermal Q72H  free water  100 mL Per Tube Q4H   lisinopril  5 mg Per Tube Daily   mouth rinse  15 mL Mouth Rinse QID   melatonin  3 mg Per Tube QHS   pantoprazole sodium  40 mg Per Tube Daily   polyethylene glycol  17 g Per Tube BID   pregabalin  100 mg Per Tube TID   sennosides  10 mL Per Tube BID   sodium chloride flush  10-40 mL Intracatheter Q12H   Continuous Infusions:  sodium chloride Stopped (05/02/21 0501)   feeding supplement (JEVITY 1.5 CAL/FIBER) 1,000 mL (05/16/21 0505)     LOS: 23 days   Time spent= 35 mins    Riti Rollyson Joline Maxcy, MD [facility]  If 7PM-7AM, please contact night-coverage  05/17/2021, 11:09 AM

## 2021-05-18 DIAGNOSIS — J9611 Chronic respiratory failure with hypoxia: Secondary | ICD-10-CM | POA: Diagnosis not present

## 2021-05-18 DIAGNOSIS — R140 Abdominal distension (gaseous): Secondary | ICD-10-CM | POA: Diagnosis not present

## 2021-05-18 DIAGNOSIS — K5903 Drug induced constipation: Secondary | ICD-10-CM | POA: Diagnosis not present

## 2021-05-18 DIAGNOSIS — K567 Ileus, unspecified: Secondary | ICD-10-CM | POA: Diagnosis not present

## 2021-05-18 NOTE — Progress Notes (Signed)
PROGRESS NOTE    Andrew Rivera  YSA:[id number] DOB: [birth year] DOA: 04/23/2021 PCP: Larena Glassman, MD   Brief Narrative:  70 year old male with chronic trach/chronic vent at [facility] - vent side, not LTAC, history of constipation chronic irritation, prior pneumonia, critical illness myopathy functional quadriplegia, comes into the hospital with abdominal distention.  CT on admission showed colonic distention/ileus without obstruction or perforation.  GI consulted, he was placed on aggressive bowel regimen with resolution of his ileus.  GI signed off and eventually was transferred to the hospitalist service.  Patient had an event of hypotension and fever on 7/8, he was briefly on antibiotics but all cultures were negative and has been monitored off antibiotics since 7/10.  He has had no further issues, awaiting placement back   Assessment & Plan:   Active Problems:   Ileus (HCC)   Abdominal distension   Constipation   Hypokalemia   Chronic respiratory failure with hypoxia [facility])  Colonic distention/ileus-he has a history of the same, he was admitted to the hospital and GI was consulted.  This was likely opioid induced, he had a movie prep with resolution of his symptoms.  Changed PRN Senokot and MiraLAX to scheduled on 7/21 as he has not had a bowel movement in 2 days. -Started having bowel movements again -Stable for discharge, TOC working on placement   Isolated fever 7/8-no clear source, all cultures have remained negative.  Remained stable off antibiotics   Functional quadriplegia, chronic respiratory failure, tracheostomy and vent dependent, PEG tube in place-all stable at this point.  Ordered I-S/flutter.   Hypokalemia/hypomagnesemia-continue to monitor potassium   Anemia of chronic disease-no bleeding, hemoglobin overall stable   Chronic low back pain, neuropathy, mood disorder-continue home medications, Cymbalta, Ativan, Lyrica as well as narcotics.   DVT prophylaxis:  enoxaparin (LOVENOX) injection 40 mg Start: 04/30/21 1200 Place and maintain sequential compression device Start: 04/30/21 0733 SCDs Start: 04/24/21 0054  Code Status: Full Family Communication: Wife updated periodically  Status is: Inpatient  Remains inpatient appropriate because:Inpatient level of care appropriate due to severity of illness  Dispo: The patient is from: LTAC              Anticipated d/c is to: LTAC              Patient currently is medically stable to d/c.   Difficult to place patient No    Nutritional status  Nutrition Problem: Inadequate oral intake Etiology: inability to eat  Signs/Symptoms: NPO status  Interventions: Tube feeding  Body mass index is 29.16 kg/m.  Pressure Injury 01/27/20 Buttocks Right Stage 2 -  Partial thickness loss of dermis presenting as a shallow open injury with a red, pink wound bed without slough. (Active)  01/27/20 0115  Location: Buttocks  Location Orientation: Right  Staging: Stage 2 -  Partial thickness loss of dermis presenting as a shallow open injury with a red, pink wound bed without slough.  Wound Description (Comments):   Present on Admission: Yes     Pressure Injury 01/27/20 Back Left Deep Tissue Pressure Injury - Purple or maroon localized area of discolored intact skin or blood-filled blister due to damage of underlying soft tissue from pressure and/or shear. (Active)  01/27/20 0115  Location: Back  Location Orientation: Left  Staging: Deep Tissue Pressure Injury - Purple or maroon localized area of discolored intact skin or blood-filled blister due to damage of underlying soft tissue from pressure and/or shear.  Wound Description (Comments):   Present  on Admission: Yes          Subjective: Patient does have any complaints.  RN at bedside with me   Examination: Constitutional: Not in acute distress.  Appears chronically ill Respiratory: Clear to auscultation bilaterally Cardiovascular: Normal sinus  rhythm, no rubs Abdomen: Nontender nondistended good bowel sounds Musculoskeletal: No edema noted Skin: No rashes seen Neurologic: CN 2-12 grossly intact.  And nonfocal Psychiatric: Normal judgment and insight. Alert and oriented x 3. Normal mood.  Trach and PEG in place   Objective: Vitals:   05/18/21 0335 05/18/21 0400 05/18/21 0600 05/18/21 0731  BP: 104/67 (!) 115/46 (!) 109/46   Pulse: 67 69 71   Resp: 16 16 16    Temp:    98.3 F (36.8 C)  TempSrc:    Oral  SpO2: 98% 98% 98%   Weight:      Height:        Intake/Output Summary (Last 24 hours) at 05/18/2021 0759 Last data filed at 05/18/2021 0600 Gross per 24 hour  Intake 1835 ml  Output 1295 ml  Net 540 ml   Filed Weights   05/13/21 0413 05/16/21 0600 05/17/21 0347  Weight: 88.5 kg 86.3 kg 87 kg     Data Reviewed:   CBC: No results for input(s): WBC, NEUTROABS, HGB, HCT, MCV, PLT in the last 168 hours. Basic Metabolic Panel: Recent Labs  Lab 05/13/21 0728 05/14/21 0553 05/15/21 0740  NA 134*  --  136  K 5.1  --  4.4  CL 100  --  103  CO2 21*  --  23  GLUCOSE 115*  --  107*  BUN 53*  --  39*  CREATININE 0.34* <0.30* <0.30*  CALCIUM 8.6*  --  8.8*   GFR: CrCl cannot be calculated (This lab value cannot be used to calculate CrCl because it is not a number: <0.30). Liver Function Tests: No results for input(s): AST, ALT, ALKPHOS, BILITOT, PROT, ALBUMIN in the last 168 hours. No results for input(s): LIPASE, AMYLASE in the last 168 hours. No results for input(s): AMMONIA in the last 168 hours. Coagulation Profile: No results for input(s): INR, PROTIME in the last 168 hours. Cardiac Enzymes: No results for input(s): CKTOTAL, CKMB, CKMBINDEX, TROPONINI in the last 168 hours. BNP (last 3 results) No results for input(s): PROBNP in the last 8760 hours. HbA1C: No results for input(s): HGBA1C in the last 72 hours. CBG: No results for input(s): GLUCAP in the last 168 hours.  Lipid Profile: No results  for input(s): CHOL, HDL, LDLCALC, TRIG, CHOLHDL, LDLDIRECT in the last 72 hours. Thyroid Function Tests: No results for input(s): TSH, T4TOTAL, FREET4, T3FREE, THYROIDAB in the last 72 hours. Anemia Panel: No results for input(s): VITAMINB12, FOLATE, FERRITIN, TIBC, IRON, RETICCTPCT in the last 72 hours. Sepsis Labs: No results for input(s): PROCALCITON, LATICACIDVEN in the last 168 hours.  No results found for this or any previous visit (from the past 240 hour(s)).       Radiology Studies: No results found.      Scheduled Meds:  acetaminophen (TYLENOL) oral liquid 160 mg/5 mL  960 mg Per Tube Q8H   doxazosin  2 mg Per Tube Daily   DULoxetine  30 mg Oral BID   enoxaparin (LOVENOX) injection  40 mg Subcutaneous Q24H   feeding supplement (PROSource TF)  45 mL Per Tube TID   fentaNYL  1 patch Transdermal Q72H   free water  100 mL Per Tube Q4H   lisinopril  5  mg Per Tube Daily   mouth rinse  15 mL Mouth Rinse QID   melatonin  3 mg Per Tube QHS   pantoprazole sodium  40 mg Per Tube Daily   polyethylene glycol  17 g Per Tube BID   pregabalin  100 mg Per Tube TID   sennosides  10 mL Per Tube BID   sodium chloride flush  10-40 mL Intracatheter Q12H   Continuous Infusions:  sodium chloride Stopped (05/02/21 0501)   feeding supplement (JEVITY 1.5 CAL/FIBER) 1,000 mL (05/16/21 0505)     LOS: 24 days   Time spent= 35 mins    Sherria Riemann Joline Maxcy, MD [facility]  If 7PM-7AM, please contact night-coverage  05/18/2021, 7:59 AM

## 2021-05-18 NOTE — Progress Notes (Signed)
Nutrition Follow-up  DOCUMENTATION CODES:   Not applicable  INTERVENTION:   Continue TF via PEG: Jevity 1.5 at 55 ml/h (1320 ml/d) Prosource TF 45 ml TID  Provides 2100 kcal, 117 gm protein, 1003 ml free water daily  Continue free water flushes 100 ml every 4 hours for total of 1603 ml per day. Recommend adjust free water flushes as needed based on sodium levels.   NUTRITION DIAGNOSIS:   Inadequate oral intake related to inability to eat as evidenced by NPO status.  Ongoing   GOAL:   Patient will meet greater than or equal to 90% of their needs  Met with TF  MONITOR:   I & O's, TF tolerance  REASON FOR ASSESSMENT:   Ventilator    ASSESSMENT:   Pt admitted from [facility] with increasing abdominal distention and abdominal pain due to constipation and ileus. Pt with PMH of chronic pain, septic shock, chronic respiratory failure, recent ED visit for constipation, hx spinal surgeries, MSSA bacteriemia and pressure injury.  Discussed patient in ICU rounds and with RN today. Patient is medically stable for discharge; awaiting bed availability at [facility]. Patient is tolerating TF at goal rate. Currently receiving Jevity 1.5 at 55 ml/h with Prosource TF 45 ml TID via PEG.  Patient remains intubated on ventilator support via trach MV: 7.4 L/min Temp (24hrs), Avg:98.3 F (36.8 C), Min:98.1 F (36.7 C), Max:98.5 F (36.9 C)   Labs reviewed.  Medications reviewed and include Protonix, Senokot, Miralax.  Weight 87 kg today, down from 90.9 kg on admission 7/3. I/O -7 L since admission UOP 1295 ml x 24 hours  Diet Order:   Diet Order             Diet NPO time specified  Diet effective now                   EDUCATION NEEDS:   Not appropriate for education at this time  Skin:  Skin Assessment: Reviewed RN Assessment  Last BM:  7/26  Height:   Ht Readings from Last 1 Encounters:  05/10/21 5' 8"  (1.727 m)    Weight:   Wt Readings from Last 1  Encounters:  05/17/21 87 kg    BMI:  Body mass index is 29.16 kg/m.  Estimated Nutritional Needs:   Kcal:  1900-2100  Protein:  100-125 grams  Fluid:  >1.9 L/day    Lucas Mallow, RD, LDN, CNSC Please refer to Amion for contact information.

## 2021-05-18 NOTE — TOC Transition Note (Signed)
Transition of Care [facility]) - CM/SW Discharge Note   Patient Details  Name: XAVYER STEENSON MRN: [id number] Date of Birth: Jul 07, 1951  Transition of Care [facility]) CM/SW Contact:  Ralene Bathe, LCSWA Phone Number: 05/18/2021, 10:29 AM   Clinical Narrative:     CSW called Prem with [facility] to inquire about bed availability.  There was no answer.  CSW then called Irving Burton with [facility] to inquire about who CSW could contact due to being unable to get in contact with [facility].  CSW was encouraged to call [name] at [phone].  Irving Burton agreed to send an email to [facility] and Elonda Husky informing them that CSW has been trying to get in contact with someone at the facility.    CSW called [name].  There was no answer.  CSW left a VM requesting a returned call.  10:28- CSW received a message from Prem informing CSW that the facility is full and there are no planned discharges at this time.    Pending: [facility] bed availability.      Barriers to Discharge: Insurance Authorization   Patient Goals and CMS Choice        Discharge Placement                       Discharge Plan and Services                                     Social Determinants of Health (SDOH) Interventions     Readmission Risk Interventions No flowsheet data found.

## 2021-05-19 DIAGNOSIS — J9601 Acute respiratory failure with hypoxia: Secondary | ICD-10-CM | POA: Diagnosis not present

## 2021-05-19 DIAGNOSIS — J9611 Chronic respiratory failure with hypoxia: Secondary | ICD-10-CM | POA: Diagnosis not present

## 2021-05-19 DIAGNOSIS — K5900 Constipation, unspecified: Secondary | ICD-10-CM | POA: Diagnosis not present

## 2021-05-19 DIAGNOSIS — K567 Ileus, unspecified: Secondary | ICD-10-CM | POA: Diagnosis not present

## 2021-05-19 DIAGNOSIS — R140 Abdominal distension (gaseous): Secondary | ICD-10-CM | POA: Diagnosis not present

## 2021-05-19 MED ORDER — DIPHENHYDRAMINE HCL 12.5 MG/5ML PO ELIX
25.0000 mg | ORAL_SOLUTION | Freq: Three times a day (TID) | ORAL | Status: DC | PRN
  Administered 2021-05-19 – 2021-06-04 (×18): 25 mg
  Filled 2021-05-19 (×19): qty 10

## 2021-05-19 NOTE — Progress Notes (Signed)
PROGRESS NOTE    Andrew Rivera  ZMO:[id number] DOB: 08/20/1951 DOA: 04/23/2021 PCP: Larena Glassman, MD   Brief Narrative:  70 year old male with chronic trach/chronic vent at [facility] - vent side, not LTAC, history of constipation chronic irritation, prior pneumonia, critical illness myopathy functional quadriplegia, comes into the hospital with abdominal distention.  CT on admission showed colonic distention/ileus without obstruction or perforation.  GI consulted, he was placed on aggressive bowel regimen with resolution of his ileus.  GI signed off and eventually was transferred to the hospitalist service.  Patient had an event of hypotension and fever on 7/8, he was briefly on antibiotics but all cultures were negative and has been monitored off antibiotics since 7/10.  He has had no further issues, awaiting placement back   Assessment & Plan:   Active Problems:   Ileus (HCC)   Abdominal distension   Constipation   Hypokalemia   Chronic respiratory failure with hypoxia [facility])  Colonic distention/ileus-he has a history of the same, he was admitted to the hospital and GI was consulted.  This was likely opioid induced, he had a movie prep with resolution of his symptoms.  Changed PRN Senokot and MiraLAX to scheduled on 7/21 as he has not had a bowel movement in 2 days. -Started having bowel movements again -Stable for discharge, TOC working on placement   Isolated fever 7/8-no clear source, all cultures have remained negative.  Remained stable off antibiotics   Functional quadriplegia, chronic respiratory failure, tracheostomy and vent dependent, PEG tube in place-all stable at this point.  Ordered I-S/flutter.   Hypokalemia/hypomagnesemia-continue to monitor potassium   Anemia of chronic disease-no bleeding, hemoglobin overall stable   Chronic low back pain, neuropathy, mood disorder-continue home medications, Cymbalta, Ativan, Lyrica as well as narcotics.  Pruritis- Benadryl     DVT prophylaxis: enoxaparin (LOVENOX) injection 40 mg Start: 04/30/21 1200 Place and maintain sequential compression device Start: 04/30/21 0733 SCDs Start: 04/24/21 0054  Code Status: Full Family Communication: Wife updated today  Status is: Inpatient  Remains inpatient appropriate because:Inpatient level of care appropriate due to severity of illness  Dispo: The patient is from: LTAC              Anticipated d/c is to: LTAC              Patient currently is medically stable to d/c.   Difficult to place patient No    Nutritional status  Nutrition Problem: Inadequate oral intake Etiology: inability to eat  Signs/Symptoms: NPO status  Interventions: Tube feeding  Body mass index is 29.16 kg/m.  Pressure Injury 01/27/20 Buttocks Right Stage 2 -  Partial thickness loss of dermis presenting as a shallow open injury with a red, pink wound bed without slough. (Active)  01/27/20 0115  Location: Buttocks  Location Orientation: Right  Staging: Stage 2 -  Partial thickness loss of dermis presenting as a shallow open injury with a red, pink wound bed without slough.  Wound Description (Comments):   Present on Admission: Yes     Pressure Injury 01/27/20 Back Left Deep Tissue Pressure Injury - Purple or maroon localized area of discolored intact skin or blood-filled blister due to damage of underlying soft tissue from pressure and/or shear. (Active)  01/27/20 0115  Location: Back  Location Orientation: Left  Staging: Deep Tissue Pressure Injury - Purple or maroon localized area of discolored intact skin or blood-filled blister due to damage of underlying soft tissue from pressure and/or shear.  Wound Description (  Comments):   Present on Admission: Yes          Subjective: Has complaints of some itching. No other complaints.    Examination: Constitutional: Not in acute distress; chronically ill appearing.  Respiratory: Clear to auscultation bilaterally Cardiovascular:  Normal sinus rhythm, no rubs Abdomen: Nontender nondistended good bowel sounds Musculoskeletal: No edema noted Skin: No rashes seen Neurologic: CN 2-12 grossly intact.  And nonfocal Psychiatric: Normal judgment and insight. Alert and oriented x 3. Normal mood.    Trach and PEG in place   Objective: Vitals:   05/19/21 0913 05/19/21 0930 05/19/21 1045 05/19/21 1125  BP: (!) 156/55  (!) 164/52   Pulse:   80   Resp:   17   Temp:    98.7 F (37.1 C)  TempSrc:    Oral  SpO2:  96% 97%   Weight:      Height:        Intake/Output Summary (Last 24 hours) at 05/19/2021 1211 Last data filed at 05/19/2021 1158 Gross per 24 hour  Intake 1045 ml  Output 1225 ml  Net -180 ml   Filed Weights   05/13/21 0413 05/16/21 0600 05/17/21 0347  Weight: 88.5 kg 86.3 kg 87 kg     Data Reviewed:   CBC: No results for input(s): WBC, NEUTROABS, HGB, HCT, MCV, PLT in the last 168 hours. Basic Metabolic Panel: Recent Labs  Lab 05/13/21 0728 05/14/21 0553 05/15/21 0740  NA 134*  --  136  K 5.1  --  4.4  CL 100  --  103  CO2 21*  --  23  GLUCOSE 115*  --  107*  BUN 53*  --  39*  CREATININE 0.34* <0.30* <0.30*  CALCIUM 8.6*  --  8.8*   GFR: CrCl cannot be calculated (This lab value cannot be used to calculate CrCl because it is not a number: <0.30). Liver Function Tests: No results for input(s): AST, ALT, ALKPHOS, BILITOT, PROT, ALBUMIN in the last 168 hours. No results for input(s): LIPASE, AMYLASE in the last 168 hours. No results for input(s): AMMONIA in the last 168 hours. Coagulation Profile: No results for input(s): INR, PROTIME in the last 168 hours. Cardiac Enzymes: No results for input(s): CKTOTAL, CKMB, CKMBINDEX, TROPONINI in the last 168 hours. BNP (last 3 results) No results for input(s): PROBNP in the last 8760 hours. HbA1C: No results for input(s): HGBA1C in the last 72 hours. CBG: No results for input(s): GLUCAP in the last 168 hours.  Lipid Profile: No results  for input(s): CHOL, HDL, LDLCALC, TRIG, CHOLHDL, LDLDIRECT in the last 72 hours. Thyroid Function Tests: No results for input(s): TSH, T4TOTAL, FREET4, T3FREE, THYROIDAB in the last 72 hours. Anemia Panel: No results for input(s): VITAMINB12, FOLATE, FERRITIN, TIBC, IRON, RETICCTPCT in the last 72 hours. Sepsis Labs: No results for input(s): PROCALCITON, LATICACIDVEN in the last 168 hours.  No results found for this or any previous visit (from the past 240 hour(s)).       Radiology Studies: No results found.      Scheduled Meds:  acetaminophen (TYLENOL) oral liquid 160 mg/5 mL  960 mg Per Tube Q8H   doxazosin  2 mg Per Tube Daily   DULoxetine  30 mg Oral BID   enoxaparin (LOVENOX) injection  40 mg Subcutaneous Q24H   feeding supplement (PROSource TF)  45 mL Per Tube TID   fentaNYL  1 patch Transdermal Q72H   free water  100 mL Per Tube Q4H  lisinopril  5 mg Per Tube Daily   mouth rinse  15 mL Mouth Rinse QID   melatonin  3 mg Per Tube QHS   pantoprazole sodium  40 mg Per Tube Daily   polyethylene glycol  17 g Per Tube BID   pregabalin  100 mg Per Tube TID   sennosides  10 mL Per Tube BID   sodium chloride flush  10-40 mL Intracatheter Q12H   Continuous Infusions:  sodium chloride Stopped (05/02/21 0501)   feeding supplement (JEVITY 1.5 CAL/FIBER) 1,000 mL (05/18/21 2101)     LOS: 25 days   Time spent= 35 mins    Chimere Klingensmith Joline Maxcy, MD [facility]  If 7PM-7AM, please contact night-coverage  05/19/2021, 12:11 PM

## 2021-05-19 NOTE — Progress Notes (Signed)
   NAME:  Andrew Rivera, MRN:  [id number], DOB:  [birth year], LOS: 25 ADMISSION DATE:  04/23/2021, CONSULTATION DATE:  04/24/21 REFERRING MD:  ED, CHIEF COMPLAINT:  abdominal pain   History of Present Illness:  70 y/o male, former smoker, with hx of PNA and critical illness myopathy presented with abdominal distention and pain.  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   Pertinent  Medical History  Chronic pain, IJ thromboembolism, Septic shock, Pneumonia, Constipation with ileus, Critical illness myopathy, MSSA Bacteremia in November 2020, Pressure wounds  Significant Hospital Events: Including procedures, antibiotic start and stop dates in addition to other pertinent events   7/02 Admit 7/08 fever 101.7, start vancomycin/cefepime 7/12 trach collar trial did not last long 7/14 Ileus resolved, tolerating PSV weans 7/17 Routine trach change #6 cuffed Shiley 7/22 ATC briefly but failed due to dyspnea  Interim History / Subjective:  No sig change.  Comfortable on PS 10/5  Objective   Blood pressure (!) 156/55, pulse 71, temperature 98.2 F (36.8 C), temperature source Oral, resp. rate 16, height 5\' 8"  (1.727 m), weight 87 kg, SpO2 96 %.    Vent Mode: PSV;CPAP FiO2 (%):  [28 %-58 %] 28 % Set Rate:  [16 bmp] 16 bmp Vt Set:  [550 mL] 550 mL PEEP:  [5 cmH20] 5 cmH20 Pressure Support:  [5 cmH20-10 cmH20] 10 cmH20 Plateau Pressure:  [16 cmH20-18 cmH20] 16 cmH20   Intake/Output Summary (Last 24 hours) at 05/19/2021 05/21/2021 Last data filed at 05/19/2021 0800 Gross per 24 hour  Intake 1210 ml  Output 1100 ml  Net 110 ml   Filed Weights   05/13/21 0413 05/16/21 0600 05/17/21 0347  Weight: 88.5 kg 86.3 kg 87 kg    Examination: General:  chronically ill appearing adult male lying in bed in NAD HEENT: MM pink/moist, shiley cuffed trach midline c/d/I, anicteric Neuro: Awake, alert, pleasant, communicates  appropriately / oriented, has some motor movement of hands / feet spontaneously  CV: s1s2 RRR, no m/r/g PULM:  resps even non labored on PS 10/5, diminished bases otherwise clear  GI: soft, bsx4 hypoactive, protuberant Extremities: warm/dry, trace dependent edema  Skin: no rashes or lesions  Resolved Hospital Problem list   Colonic Ileus Hypokalemia  Assessment & Plan:   Chronic hypoxic respiratory failure in setting of critical illness myopathy, along with 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 Tracheostomy and ventilator dependence. PLAN -  Continue pressure support during daytime for comfort and full support at night Trach care per routine No need to try ATC here Back to [facility] when bed available and medically ready No further plans for changing ventilator management this admission Intermittent CXR   DM type 2 Anemia of critical illness and chronic disease Mood disorder with insomnia Chronic pain -per [facility]   ABD Pain/Distention in setting of Ileus -per [facility] / GI   PCCM will continue to f/u 2x/week for trach/vent support please call sooner if needed    [facility], NP Pulmonary/Critical Care Medicine  05/19/2021  9:38 AM

## 2021-05-19 NOTE — Progress Notes (Signed)
Assisted with tele visit with spouse

## 2021-05-20 DIAGNOSIS — J9611 Chronic respiratory failure with hypoxia: Secondary | ICD-10-CM | POA: Diagnosis not present

## 2021-05-20 DIAGNOSIS — R140 Abdominal distension (gaseous): Secondary | ICD-10-CM | POA: Diagnosis not present

## 2021-05-20 DIAGNOSIS — E876 Hypokalemia: Secondary | ICD-10-CM | POA: Diagnosis not present

## 2021-05-20 DIAGNOSIS — K5903 Drug induced constipation: Secondary | ICD-10-CM | POA: Diagnosis not present

## 2021-05-20 NOTE — Progress Notes (Signed)
PROGRESS NOTE  Andrew Rivera  WCB:[id number] DOB: 02/14/1951 DOA: 04/23/2021 PCP: Larena Glassman, MD   Brief Narrative: 70 year old male with chronic trach/chronic vent at [facility] - vent side, not LTAC, history of constipation chronic irritation, prior pneumonia, critical illness myopathy functional quadriplegia, comes into the hospital with abdominal distention.  CT on admission showed colonic distention/ileus without obstruction or perforation.  GI consulted, he was placed on aggressive bowel regimen with resolution of his ileus.  GI signed off and eventually was transferred to the hospitalist service.  Patient had an event of hypotension and fever on 7/8, he was briefly on antibiotics but all cultures were negative and has been monitored off antibiotics since 7/10.  He has had no further issues, awaiting placement back.  Assessment & Plan: Active Problems:   Ileus (HCC)   Abdominal distension   Constipation   Hypokalemia   Chronic respiratory failure with hypoxia (HCC)  Colonic distention/ileus: he has a history of the same, he was admitted to the hospital and GI was consulted.  This was likely opioid induced, he had a movie prep with resolution of his symptoms.   - Changed PRN Senokot and MiraLAX to scheduled on 7/21 - Having BMs. Stable for discharge, TOC working on placement   Isolated fever 7/8-no clear source, all cultures have remained negative.  Remained stable off antibiotics   Functional quadriplegia, chronic respiratory failure, tracheostomy and vent dependent, PEG tube in place-all stable at this point.  Ordered I-S/flutter.   Hypokalemia/hypomagnesemia-continue to monitor potassium   Anemia of chronic disease-no bleeding, hemoglobin overall stable   Chronic low back pain, neuropathy, mood disorder-continue home medications, Cymbalta, Ativan, Lyrica as well as narcotics.   Pruritis- Benadryl  RN Pressure Injury Documentation: Pressure Injury 01/27/20 Buttocks Right  Stage 2 -  Partial thickness loss of dermis presenting as a shallow open injury with a red, pink wound bed without slough. (Active)  01/27/20 0115  Location: Buttocks  Location Orientation: Right  Staging: Stage 2 -  Partial thickness loss of dermis presenting as a shallow open injury with a red, pink wound bed without slough.  Wound Description (Comments):   Present on Admission: Yes     Pressure Injury 01/27/20 Back Left Deep Tissue Pressure Injury - Purple or maroon localized area of discolored intact skin or blood-filled blister due to damage of underlying soft tissue from pressure and/or shear. (Active)  01/27/20 0115  Location: Back  Location Orientation: Left  Staging: Deep Tissue Pressure Injury - Purple or maroon localized area of discolored intact skin or blood-filled blister due to damage of underlying soft tissue from pressure and/or shear.  Wound Description (Comments):   Present on Admission: Yes   DVT prophylaxis: Lovenox Code Status: Full Family Communication: Wife by speakerphone at bedside Disposition Plan:  Status is: Inpatient  Remains inpatient appropriate because:Unsafe d/c plan  Dispo: The patient is from:  LTAC              Anticipated d/c is to: LTAC              Patient currently is medically stable to d/c.   Difficult to place patient No  Consultants:  PCCM  Subjective: No complaints, speaking with wife by speaker phone this morning. In good spirits. Denies any concerns at all. Had a BM this morning and is getting bed bath now. No abdominal pain, nausea, vomiting. TFs at goal. No dyspnea or fever.  Objective: Vitals:   05/20/21 0400 05/20/21 0600 05/20/21 0700  05/20/21 0747  BP: (!) 133/47 (!) 125/47 (!) 130/46   Pulse: 67 69 67   Resp: 16 16 16    Temp:   98.3 F (36.8 C)   TempSrc:   Oral   SpO2: 98% 98% 97% 97%  Weight:      Height:        Intake/Output Summary (Last 24 hours) at 05/20/2021 1036 Last data filed at 05/20/2021 0600 Gross per  24 hour  Intake 1345 ml  Output 1205 ml  Net 140 ml   Filed Weights   05/13/21 0413 05/16/21 0600 05/17/21 0347  Weight: 88.5 kg 86.3 kg 87 kg    Gen: Chronically ill-appearing male in no distress Pulm: Non-labored breathing thru trach. Clear to auscultation bilaterally.  CV: Regular rate and rhythm. Soft SEM at base, no other murmur, rub, or gallop. No JVD, no pitting pedal edema. GI: Abdomen distended but nontender with ++BS.   Ext: Warm, no deformities Skin: No rashes, lesions or ulcers on visualized skin Neuro: Alert and oriented. Functionally quadriplegic.   Psych: Judgement and insight appear normal. Mood & affect appropriate.   Scheduled Meds:  acetaminophen (TYLENOL) oral liquid 160 mg/5 mL  960 mg Per Tube Q8H   doxazosin  2 mg Per Tube Daily   DULoxetine  30 mg Oral BID   enoxaparin (LOVENOX) injection  40 mg Subcutaneous Q24H   feeding supplement (PROSource TF)  45 mL Per Tube TID   fentaNYL  1 patch Transdermal Q72H   free water  100 mL Per Tube Q4H   lisinopril  5 mg Per Tube Daily   mouth rinse  15 mL Mouth Rinse QID   melatonin  3 mg Per Tube QHS   pantoprazole sodium  40 mg Per Tube Daily   polyethylene glycol  17 g Per Tube BID   pregabalin  100 mg Per Tube TID   sennosides  10 mL Per Tube BID   sodium chloride flush  10-40 mL Intracatheter Q12H   Continuous Infusions:  sodium chloride Stopped (05/02/21 0501)   feeding supplement (JEVITY 1.5 CAL/FIBER) 1,000 mL (05/19/21 1953)     LOS: 26 days   Time spent: 35 minutes.  05/21/21, MD [facility] [email or web address] 05/20/2021, 10:36 AM

## 2021-05-20 NOTE — TOC Progression Note (Signed)
Transition of Care [facility]) - Progression Note    Patient Details  Name: Andrew Rivera MRN: [id number] Date of Birth: Mar 19, 1951  Transition of Care [facility]) CM/SW Contact  Ralene Bathe, LCSWA Phone Number: 05/20/2021, 10:58 AM  Clinical Narrative:    10:50-  CSW contacted Prem with [facility].  The facility does not have any beds available at this time.    Pending:  bed availability    Expected Discharge Plan: Long Term Acute Care (LTAC) Barriers to Discharge: Insurance Authorization  Expected Discharge Plan and Services Expected Discharge Plan: Long Term Acute Care (LTAC)         Expected Discharge Date: 05/17/21                                     Social Determinants of Health (SDOH) Interventions    Readmission Risk Interventions No flowsheet data found.

## 2021-05-21 DIAGNOSIS — K5903 Drug induced constipation: Secondary | ICD-10-CM | POA: Diagnosis not present

## 2021-05-21 DIAGNOSIS — E876 Hypokalemia: Secondary | ICD-10-CM | POA: Diagnosis not present

## 2021-05-21 DIAGNOSIS — J9611 Chronic respiratory failure with hypoxia: Secondary | ICD-10-CM | POA: Diagnosis not present

## 2021-05-21 DIAGNOSIS — R140 Abdominal distension (gaseous): Secondary | ICD-10-CM | POA: Diagnosis not present

## 2021-05-21 LAB — CBC
HCT: 27.2 % — ABNORMAL LOW (ref 39.0–52.0)
Hemoglobin: 8.8 g/dL — ABNORMAL LOW (ref 13.0–17.0)
MCH: 30.2 pg (ref 26.0–34.0)
MCHC: 32.4 g/dL (ref 30.0–36.0)
MCV: 93.5 fL (ref 80.0–100.0)
Platelets: 276 10*3/uL (ref 150–400)
RBC: 2.91 MIL/uL — ABNORMAL LOW (ref 4.22–5.81)
RDW: 16.1 % — ABNORMAL HIGH (ref 11.5–15.5)
WBC: 6.6 10*3/uL (ref 4.0–10.5)
nRBC: 0 % (ref 0.0–0.2)

## 2021-05-21 LAB — BASIC METABOLIC PANEL
Anion gap: 5 (ref 5–15)
BUN: 38 mg/dL — ABNORMAL HIGH (ref 8–23)
CO2: 25 mmol/L (ref 22–32)
Calcium: 8.8 mg/dL — ABNORMAL LOW (ref 8.9–10.3)
Chloride: 105 mmol/L (ref 98–111)
Creatinine, Ser: 0.3 mg/dL — ABNORMAL LOW (ref 0.61–1.24)
GFR, Estimated: >60 mL/min (ref >60)
Glucose, Bld: 119 mg/dL — ABNORMAL HIGH (ref 70–99)
Potassium: 5.2 mmol/L — ABNORMAL HIGH (ref 3.5–5.1)
Sodium: 135 mmol/L (ref 135–145)

## 2021-05-21 MED ORDER — SODIUM ZIRCONIUM CYCLOSILICATE 5 G PO PACK
5.0000 g | PACK | Freq: Once | ORAL | Status: AC
  Administered 2021-05-21: 5 g
  Filled 2021-05-21: qty 1

## 2021-05-21 NOTE — Progress Notes (Addendum)
PROGRESS NOTE  Andrew Rivera  TWS:[id number] DOB: [birth year] DOA: 04/23/2021 PCP: Larena Glassman, MD   Brief Narrative: 70 year old male with chronic trach/chronic vent at [facility] - vent side, not LTAC, history of constipation chronic irritation, prior pneumonia, critical illness myopathy functional quadriplegia, comes into the hospital with abdominal distention.  CT on admission showed colonic distention/ileus without obstruction or perforation.  GI consulted, he was placed on aggressive bowel regimen with resolution of his ileus.  GI signed off and eventually was transferred to the hospitalist service.  Patient had an event of hypotension and fever on 7/8, he was briefly on antibiotics but all cultures were negative and has been monitored off antibiotics since 7/10.  He has had no further issues, awaiting placement back.  Assessment & Plan: Active Problems:   Ileus (HCC)   Abdominal distension   Constipation   Hypokalemia   Chronic respiratory failure with hypoxia (HCC)  Colonic distention/ileus: he has a history of the same, he was admitted to the hospital and GI was consulted.  This was likely opioid induced, he had a movie prep with resolution of his symptoms.   - Continue scheduled bowel regimen. Ileus has durably resolved.   Functional quadriplegia, chronic respiratory failure, tracheostomy and vent dependent, PEG tube in place:  - All stable chronic problems with continuation of PTA regimen.   Hypokalemia/hypomagnesemia: Resolved.   Hypotension: No new anemia/evidence of bleeding. No new fever, leukocytosis, or nidus of infection. Volume status appears appropriate.  - DC lisinopril and monitor.   Hyperkalemia:  - Lokelma x1, DC lisinopril as above   Anemia of chronic disease: No bleeding. Hgb stable.  - Check H/H periodically.    Chronic low back pain, neuropathy, mood disorder:  - Continue home cymbalta, lyrica, fentanyl patch, ativan.   DVT prophylaxis: Lovenox Code  Status: Full Family Communication: Wife by speakerphone at bedside 7/29 Disposition Plan:  Status is: Inpatient  Remains inpatient appropriate because:Unsafe d/c plan. Stable for discharge, TOC working on placement  Dispo: The patient is from:  LTAC              Anticipated d/c is to: LTAC              Patient currently is medically stable to d/c.   Difficult to place patient No  Consultants:  PCCM  Subjective: No dyspnea, pain, or other complaints. Resting quietly with "nothing new" going on.   Objective: Vitals:   05/21/21 0900 05/21/21 0901 05/21/21 1000 05/21/21 1002  BP: (!) 157/46 (!) 157/46    Pulse: 81  84 85  Resp: 17  18 (!) 28  Temp:      TempSrc:      SpO2: 96%  96% 96%  Weight:      Height:        Intake/Output Summary (Last 24 hours) at 05/21/2021 1111 Last data filed at 05/21/2021 0904 Gross per 24 hour  Intake 1820 ml  Output 1375 ml  Net 445 ml   Filed Weights   05/13/21 0413 05/16/21 0600 05/17/21 0347  Weight: 88.5 kg 86.3 kg 87 kg   Gen: 70 y.o. male in no distress Pulm: Nonlabored breathing room air. Clear. CV: Regular rate and rhythm. Soft SEM at base, no new murmur, rub, or gallop. No JVD, no dependent edema. GI: Abdomen distended with ++BS, nontender.   Ext: Warm, dry. Skin: No new rashes, lesions or ulcers on visualized skin. Neuro: Alert and oriented. Quadriplegic.  Psych: Judgement and insight appear  fair. Mood euthymic & affect congruent. Behavior is appropriate.    Scheduled Meds:  acetaminophen (TYLENOL) oral liquid 160 mg/5 mL  960 mg Per Tube Q8H   doxazosin  2 mg Per Tube Daily   DULoxetine  30 mg Oral BID   enoxaparin (LOVENOX) injection  40 mg Subcutaneous Q24H   feeding supplement (PROSource TF)  45 mL Per Tube TID   fentaNYL  1 patch Transdermal Q72H   free water  100 mL Per Tube Q4H   mouth rinse  15 mL Mouth Rinse QID   melatonin  3 mg Per Tube QHS   pantoprazole sodium  40 mg Per Tube Daily   polyethylene glycol  17 g  Per Tube BID   pregabalin  100 mg Per Tube TID   sennosides  10 mL Per Tube BID   sodium chloride flush  10-40 mL Intracatheter Q12H   sodium zirconium cyclosilicate  5 g Per Tube Once   Continuous Infusions:  sodium chloride Stopped (05/02/21 0501)   feeding supplement (JEVITY 1.5 CAL/FIBER) 1,000 mL (05/20/21 1553)     LOS: 27 days   Time spent: 25 minutes.  Tyrone Nine, MD [facility] [email or web address] 05/21/2021, 11:11 AM

## 2021-05-22 DIAGNOSIS — E876 Hypokalemia: Secondary | ICD-10-CM | POA: Diagnosis not present

## 2021-05-22 DIAGNOSIS — J9611 Chronic respiratory failure with hypoxia: Secondary | ICD-10-CM | POA: Diagnosis not present

## 2021-05-22 DIAGNOSIS — K5903 Drug induced constipation: Secondary | ICD-10-CM | POA: Diagnosis not present

## 2021-05-22 DIAGNOSIS — R140 Abdominal distension (gaseous): Secondary | ICD-10-CM | POA: Diagnosis not present

## 2021-05-22 MED ORDER — ORAL CARE MOUTH RINSE
15.0000 mL | Freq: Two times a day (BID) | OROMUCOSAL | Status: DC
  Administered 2021-05-22 – 2021-06-09 (×36): 15 mL via OROMUCOSAL

## 2021-05-22 NOTE — Progress Notes (Signed)
PROGRESS NOTE  Andrew Rivera  PIR:[id number] DOB: 1951/05/28 DOA: 04/23/2021 PCP: Larena Glassman, MD   Brief Narrative: 70 year old male with chronic trach/chronic vent at [facility] - vent side, not LTAC, history of constipation chronic irritation, prior pneumonia, critical illness myopathy functional quadriplegia, comes into the hospital with abdominal distention.  CT on admission showed colonic distention/ileus without obstruction or perforation.  GI consulted, he was placed on aggressive bowel regimen with resolution of his ileus.  GI signed off and eventually was transferred to the hospitalist service.  Patient had an event of hypotension and fever on 7/8, he was briefly on antibiotics but all cultures were negative and has been monitored off antibiotics since 7/10.  He has had no further issues, awaiting placement back.  Assessment & Plan: Active Problems:   Ileus (HCC)   Abdominal distension   Constipation   Hypokalemia   Chronic respiratory failure with hypoxia (HCC)  Colonic distention/ileus: he has a history of the same, he was admitted to the hospital and GI was consulted.  This was likely opioid induced, he had a movie prep with resolution of his symptoms.   - Continue scheduled bowel regimen. Ileus has durably resolved.   Functional quadriplegia, chronic respiratory failure, tracheostomy and vent dependent, PEG tube in place:  - All stable chronic problems with continuation of PTA regimen.   Hypokalemia/hypomagnesemia: Resolved.   Hypotension: No new anemia/evidence of bleeding. No new fever, leukocytosis, or nidus of infection. Volume status appears appropriate.  - Monitor now that lisinopril has been stopped.  - May have to decrease dose of narcotic medication if persistent.  Hyperkalemia:  - Lokelma x1, DC'ed lisinopril as above   Anemia of chronic disease: No bleeding. Hgb stable.  - Check H/H periodically.    Chronic low back pain, neuropathy, mood disorder:  -  Continue home cymbalta, lyrica, fentanyl patch, ativan.   DVT prophylaxis: Lovenox Code Status: Full Family Communication: Wife by speakerphone at bedside 7/29 Disposition Plan:  Status is: Inpatient  Remains inpatient appropriate because:Unsafe d/c plan. Stable for discharge, TOC working on placement  Dispo: The patient is from:  LTAC              Anticipated d/c is to: LTAC              Patient currently is medically stable to d/c.   Difficult to place patient No  Consultants:  PCCM  Subjective: No complaints. Denies dyspnea, pain, bleeding, fever/feeling ill. BP low last night but improved this AM. RN reports hypotension coincides with dilaudid use. Still having BMs.  Objective: Vitals:   05/22/21 0400 05/22/21 0500 05/22/21 0600 05/22/21 0700  BP: (!) 124/50 (!) 151/76 (!) 145/48   Pulse: 64 71 69   Resp: 16 17 16    Temp:    98.2 F (36.8 C)  TempSrc:    Oral  SpO2: 98% 99% 97%   Weight:      Height:        Intake/Output Summary (Last 24 hours) at 05/22/2021 0935 Last data filed at 05/22/2021 0600 Gross per 24 hour  Intake 960 ml  Output 1005 ml  Net -45 ml   Filed Weights   05/13/21 0413 05/16/21 0600 05/17/21 0347  Weight: 88.5 kg 86.3 kg 87 kg   Gen: 70 y.o. male in no distress Pulm: Nonlabored breathing on vent. Clear. CV: Regular rate and rhythm. No murmur, rub, or gallop. No JVD, nosignificant dependent edema. GI: Abdomen soft, protuberant ++BS, nontender. Ext: Warm,  no deformities Skin: No rashes, lesions or ulcers on visualized skin. Neuro: Alert and oriented. Quadriplegic. Psych: Judgement and insight appear fair. Mood euthymic & affect congruent. Behavior is appropriate.    Scheduled Meds:  acetaminophen (TYLENOL) oral liquid 160 mg/5 mL  960 mg Per Tube Q8H   doxazosin  2 mg Per Tube Daily   DULoxetine  30 mg Oral BID   enoxaparin (LOVENOX) injection  40 mg Subcutaneous Q24H   feeding supplement (PROSource TF)  45 mL Per Tube TID   fentaNYL  1  patch Transdermal Q72H   free water  100 mL Per Tube Q4H   mouth rinse  15 mL Mouth Rinse QID   melatonin  3 mg Per Tube QHS   pantoprazole sodium  40 mg Per Tube Daily   polyethylene glycol  17 g Per Tube BID   pregabalin  100 mg Per Tube TID   sennosides  10 mL Per Tube BID   sodium chloride flush  10-40 mL Intracatheter Q12H   Continuous Infusions:  sodium chloride Stopped (05/02/21 0501)   feeding supplement (JEVITY 1.5 CAL/FIBER) 1,000 mL (05/21/21 1515)     LOS: 28 days   Time spent: 25 minutes.  Tyrone Nine, MD [facility] [email or web address] 05/22/2021, 9:35 AM

## 2021-05-23 DIAGNOSIS — E876 Hypokalemia: Secondary | ICD-10-CM | POA: Diagnosis not present

## 2021-05-23 DIAGNOSIS — J9601 Acute respiratory failure with hypoxia: Secondary | ICD-10-CM | POA: Diagnosis not present

## 2021-05-23 DIAGNOSIS — J9611 Chronic respiratory failure with hypoxia: Secondary | ICD-10-CM | POA: Diagnosis not present

## 2021-05-23 DIAGNOSIS — K5903 Drug induced constipation: Secondary | ICD-10-CM | POA: Diagnosis not present

## 2021-05-23 DIAGNOSIS — R140 Abdominal distension (gaseous): Secondary | ICD-10-CM | POA: Diagnosis not present

## 2021-05-23 LAB — BASIC METABOLIC PANEL
Anion gap: 8 (ref 5–15)
BUN: 30 mg/dL — ABNORMAL HIGH (ref 8–23)
CO2: 26 mmol/L (ref 22–32)
Calcium: 8.8 mg/dL — ABNORMAL LOW (ref 8.9–10.3)
Chloride: 101 mmol/L (ref 98–111)
Creatinine, Ser: <0.3 mg/dL — ABNORMAL LOW (ref 0.61–1.24)
Glucose, Bld: 114 mg/dL — ABNORMAL HIGH (ref 70–99)
Potassium: 4.4 mmol/L (ref 3.5–5.1)
Sodium: 135 mmol/L (ref 135–145)

## 2021-05-23 MED ORDER — SODIUM ZIRCONIUM CYCLOSILICATE 5 G PO PACK
5.0000 g | PACK | Freq: Once | ORAL | Status: DC
  Filled 2021-05-23: qty 1

## 2021-05-23 NOTE — Progress Notes (Signed)
PROGRESS NOTE  Andrew Rivera  GLO:[id number] DOB: [birth year] DOA: 04/23/2021 PCP: Larena Glassman, MD   Brief Narrative: 70 year old male with chronic trach/chronic vent at [facility] - vent side, not LTAC, history of constipation chronic irritation, prior pneumonia, critical illness myopathy functional quadriplegia, comes into the hospital with abdominal distention.  CT on admission showed colonic distention/ileus without obstruction or perforation.  GI consulted, he was placed on aggressive bowel regimen with resolution of his ileus.  GI signed off and eventually was transferred to the hospitalist service.  Patient had an event of hypotension and fever on 7/8, he was briefly on antibiotics but all cultures were negative and has been monitored off antibiotics since 7/10.  He has had no further issues, awaiting placement back.  Assessment & Plan: Active Problems:   Ileus (HCC)   Abdominal distension   Constipation   Hypokalemia   Chronic respiratory failure with hypoxia (HCC)  Colonic distention/ileus: he has a history of the same, he was admitted to the hospital and GI was consulted.  This was likely opioid induced, he had a movie prep with resolution of his symptoms.   - Continue scheduled bowel regimen. Ileus has durably resolved.   Functional quadriplegia, chronic respiratory failure, tracheostomy and vent dependent, PEG tube in place:  - All stable chronic problems with continuation of PTA regimen.   Hypokalemia/hypomagnesemia: Resolved.   Hypotension: No new anemia/evidence of bleeding. No new fever, leukocytosis, or nidus of infection. Volume status appears appropriate.  - Monitor now that lisinopril has been stopped.  - May have to decrease dose of narcotic medication if persistent.  Hyperkalemia:  - Given lokelma, DC'ed lisinopril as above. Resolved.    Anemia of chronic disease: No bleeding. Hgb stable.  - Check H/H periodically.    Chronic low back pain, neuropathy, mood  disorder:  - Continue home cymbalta, lyrica, fentanyl patch, ativan.   DVT prophylaxis: Lovenox Code Status: Full Family Communication: Wife by speakerphone at bedside 7/29 Disposition Plan:  Status is: Inpatient  Remains inpatient appropriate because:Unsafe d/c plan. Stable for discharge, TOC working on placement  Dispo: The patient is from:  LTAC              Anticipated d/c is to: LTAC              Patient currently is medically stable to d/c.   Difficult to place patient No  Consultants:  PCCM  Subjective: No new complaints or issues. Sleeping ok, moving bowels. Continues full support at night, PS during day.   Objective: Vitals:   05/23/21 0600 05/23/21 0700 05/23/21 0738 05/23/21 0800  BP: (!) 124/46 (!) 125/44 (!) 125/44 (!) 126/40  Pulse: 66 66 67 71  Resp: 16 20 16    Temp:  98.4 F (36.9 C)    TempSrc:  Oral    SpO2: 98% 98% 100% 98%  Weight:      Height:        Intake/Output Summary (Last 24 hours) at 05/23/2021 0905 Last data filed at 05/23/2021 0800 Gross per 24 hour  Intake 2200 ml  Output 975 ml  Net 1225 ml   Filed Weights   05/16/21 0600 05/17/21 0347 05/23/21 0500  Weight: 86.3 kg 87 kg 86 kg   Gen: 70 y.o. male in no distress Pulm: Nonlabored, triggers PS, clear CV: Regular rate and rhythm. No murmur, rub, or gallop. No JVD, no significant dependent edema. GI: Abdomen soft, protuberant, +BS, non-tender, with normoactive bowel sounds.  Ext: Warm,  dry, decreased muscle bulk and tone Skin: No new rashes, lesions or ulcers on visualized skin. Neuro: Alert and oriented. Functionally quadriplegic without new focal neurological deficits. Psych: Judgement and insight appear fair. Mood euthymic & affect congruent. Behavior is appropriate.    Scheduled Meds:  acetaminophen (TYLENOL) oral liquid 160 mg/5 mL  960 mg Per Tube Q8H   doxazosin  2 mg Per Tube Daily   DULoxetine  30 mg Oral BID   enoxaparin (LOVENOX) injection  40 mg Subcutaneous Q24H    feeding supplement (PROSource TF)  45 mL Per Tube TID   fentaNYL  1 patch Transdermal Q72H   free water  100 mL Per Tube Q4H   mouth rinse  15 mL Mouth Rinse BID   melatonin  3 mg Per Tube QHS   pantoprazole sodium  40 mg Per Tube Daily   polyethylene glycol  17 g Per Tube BID   pregabalin  100 mg Per Tube TID   sennosides  10 mL Per Tube BID   sodium chloride flush  10-40 mL Intracatheter Q12H   Continuous Infusions:  sodium chloride Stopped (05/02/21 0501)   feeding supplement (JEVITY 1.5 CAL/FIBER) 1,000 mL (05/22/21 1431)     LOS: 29 days   Time spent: 25 minutes.  Tyrone Nine, MD [facility] [email or web address] 05/23/2021, 9:05 AM

## 2021-05-23 NOTE — Progress Notes (Signed)
   NAME:  Andrew Rivera, MRN:  [id number], DOB:  19-Feb-1951, LOS: 29 ADMISSION DATE:  04/23/2021, CONSULTATION DATE:  04/24/21 REFERRING MD:  ED, CHIEF COMPLAINT:  abdominal pain   History of Present Illness:  70 y/o male, former smoker, with hx of PNA and critical illness myopathy presented with abdominal distention and pain.  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   Pertinent  Medical History  Chronic pain, IJ thromboembolism, Septic shock, Pneumonia, Constipation with ileus, Critical illness myopathy, MSSA Bacteremia in November 2020, Pressure wounds  Significant Hospital Events: Including procedures, antibiotic start and stop dates in addition to other pertinent events   7/02 Admit 7/08 fever 101.7, start vancomycin/cefepime 7/12 trach collar trial did not last long 7/14 Ileus resolved, tolerating PSV weans 7/17 Routine trach change #6 cuffed Shiley 7/22 ATC briefly but failed due to dyspnea  Interim History / Subjective:  No events. Moving bowels. Denies pain.  Objective   Blood pressure (!) 125/44, pulse 67, temperature 98.4 F (36.9 C), temperature source Oral, resp. rate 16, height 5\' 8"  (1.727 m), weight 86 kg, SpO2 100 %.    Vent Mode: PRVC FiO2 (%):  [28 %] 28 % Set Rate:  [16 bmp] 16 bmp Vt Set:  [550 mL] 550 mL PEEP:  [5 cmH20] 5 cmH20 Plateau Pressure:  [15 cmH20-17 cmH20] 17 cmH20   Intake/Output Summary (Last 24 hours) at 05/23/2021 0802 Last data filed at 05/23/2021 0700 Gross per 24 hour  Intake 2200 ml  Output 875 ml  Net 1325 ml    Filed Weights   05/16/21 0600 05/17/21 0347 05/23/21 0500  Weight: 86.3 kg 87 kg 86 kg    Examination: Chronically ill man on vent Small thick mucoid secretions from trach Able to move all 4 ext weakly Triggers vent, doing well on PS Ext with muscle wasting Heart sounds regular  No new labs, will send BMET as he is ordered  [facility] Problem list   Colonic Ileus Hypokalemia  Assessment & Plan:   Chronic hypoxic respiratory failure in setting of critical illness myopathy, along with 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 Tracheostomy and ventilator dependence. PLAN -  Continue pressure support during daytime for comfort and full support at night Trach care per routine No need to try ATC here Back to [facility] when bed available and medically ready No further plans for changing ventilator management this admission Intermittent CXR   DM type 2 Anemia of critical illness and chronic disease Mood disorder with insomnia Chronic pain -per [facility]   ABD Pain/Distention in setting of Ileus- improved -per [facility] [address] / GI   Will check on again 8/4 if still here  10/4 MD PCCM

## 2021-05-23 NOTE — TOC Progression Note (Addendum)
Transition of Care [facility]) - Progression Note    Patient Details  Name: Andrew Rivera MRN: [id number] Date of Birth: Dec 31, 1950  [facility]) CM/SW Contact  Ralene Bathe, LCSWA Phone Number: 05/23/2021, 10:53 AM  Clinical Narrative:    CSW contacted Prem with [facility] ([facility]) to inquire about bed availability.  CSW is awaiting a response.   11:17-  CSW informed by Prem that there are no beds available at [facility] at this time.    Expected Discharge Plan: Long Term Acute Care (LTAC) Barriers to Discharge: Insurance Authorization  Expected Discharge Plan and Services Expected Discharge Plan: Long Term Acute Care (LTAC)         Expected Discharge Date: 05/17/21                                     Social Determinants of Health (SDOH) Interventions    Readmission Risk Interventions No flowsheet data found.

## 2021-05-24 DIAGNOSIS — R140 Abdominal distension (gaseous): Secondary | ICD-10-CM | POA: Diagnosis not present

## 2021-05-24 DIAGNOSIS — E876 Hypokalemia: Secondary | ICD-10-CM | POA: Diagnosis not present

## 2021-05-24 DIAGNOSIS — K5903 Drug induced constipation: Secondary | ICD-10-CM | POA: Diagnosis not present

## 2021-05-24 DIAGNOSIS — J9611 Chronic respiratory failure with hypoxia: Secondary | ICD-10-CM | POA: Diagnosis not present

## 2021-05-24 LAB — BASIC METABOLIC PANEL
Anion gap: 8 (ref 5–15)
BUN: 28 mg/dL — ABNORMAL HIGH (ref 8–23)
CO2: 26 mmol/L (ref 22–32)
Calcium: 9 mg/dL (ref 8.9–10.3)
Chloride: 101 mmol/L (ref 98–111)
Creatinine, Ser: <0.3 mg/dL — ABNORMAL LOW (ref 0.61–1.24)
Glucose, Bld: 110 mg/dL — ABNORMAL HIGH (ref 70–99)
Potassium: 4.4 mmol/L (ref 3.5–5.1)
Sodium: 135 mmol/L (ref 135–145)

## 2021-05-24 MED ORDER — MIDODRINE HCL 5 MG PO TABS
5.0000 mg | ORAL_TABLET | Freq: Three times a day (TID) | ORAL | Status: DC
  Administered 2021-05-24 – 2021-05-25 (×5): 5 mg
  Filled 2021-05-24 (×5): qty 1

## 2021-05-24 NOTE — TOC Progression Note (Addendum)
Transition of Care [facility]) - Progression Note    Patient Details  Name: Andrew Rivera MRN: [id number] Date of Birth: 1951/08/12  Transition of Care [facility]) CM/SW Contact  Ralene Bathe, LCSWA Phone Number: 05/24/2021, 9:46 AM  Clinical Narrative:    09:45-  CSW contacted Prem with [facility] (vent SNF) to inquire about the availability of male beds.  CSW is awaiting a response.  CSW contacted the patient's wife and provided an update on the status of finding placement for the patient.  CSW asked if the wife would be agreeable to CSW sending the referral to the other 2 Vent SNF facilities in [place] or in other states.  The patient's wife reports that she still wants to wait until [facility] has a bed available due to the distance that she will have to drive to see the patient.    11:08-  CSW was informed by Prem that there are currently no beds available at [facility] (vent SNF).  Pending: Bed availability    Expected Discharge Plan: Long Term Acute Care (LTAC) Barriers to Discharge: Insurance Authorization  Expected Discharge Plan and Services Expected Discharge Plan: Long Term Acute Care (LTAC)         Expected Discharge Date: 05/17/21                                     Social Determinants of Health (SDOH) Interventions    Readmission Risk Interventions No flowsheet data found.

## 2021-05-24 NOTE — Evaluation (Signed)
Physical Therapy Evaluation and Discharge Patient Details Name: Andrew Rivera MRN: [id number] DOB: May 13, 1951 Today's Date: 05/24/2021   History of Present Illness  70 year old male  comes into the hospital 04/23/21 with abdominal distention. GI consulted, he was placed on aggressive bowel regimen with resolution of his ileus. Episode of hypotension and fever 7/8.  PMH chronic trach/chronic vent at [facility] - vent side, not LTAC, history of constipation chronic irritation, prior pneumonia, critical illness myopathy functional quadriplegia  Clinical Impression   Patient evaluated by Physical Therapy with no further acute PT needs identified. Contacted staff at [facility] Tresa Endo, OT) re: pt's functional status and if receiving therapy services. Patient was not receiving PT services. He was discharged from PT when last receiving services for upright positioning in chair. Patient requires a "purple mattress" placed over his gerichair for skin protection and positioning when he is OOB. Nursing lifts him OOB 2x/week. Do not recommend attempting to lift pt OOB to our recliners as they are not suited for stabilizing a quadriplegic in upright position.   Patient noted to have contractures in bil LEs that will not be resolved through stretching. Patient can benefit from continued PROM by nursing during bathing and positioning patient in bed. No skilled PT needs identified.   PT is signing off. Thank you for this referral.     Follow Up Recommendations No PT follow up    Equipment Recommendations  None recommended by PT    Recommendations for Other Services       Precautions / Restrictions Precautions Precautions: Other (comment) Precaution Comments: chronic trach      Mobility  Bed Mobility Overal bed mobility: Needs Assistance             General bed mobility comments: total assist for all mobility    Transfers                    Ambulation/Gait                 Stairs            Wheelchair Mobility    Modified Rankin (Stroke Patients Only)       Balance                                             Pertinent Vitals/Pain Pain Assessment: Faces Faces Pain Scale: Hurts whole lot Pain Location: rt thigh with RLE PROM Pain Descriptors / Indicators: Discomfort;Grimacing Pain Intervention(s): Limited activity within patient's tolerance;Monitored during session    Home Living Family/patient expects to be discharged to:: Skilled nursing facility                 Additional Comments: [facility] vent SNF    Prior Function Level of Independence: Needs assistance   Gait / Transfers Assistance Needed: pt was not receiving PT at [facility]; nursing was lifting pt OOB to geri-chair 2x per week. The chair is fitted with a "purple mattress" for positioning and comfort.  ADL's / Homemaking Assistance Needed: OT was seeing pt for "contracture management" for his neck and shoulders. Patient does not self feed as he no longer eats by mouth. total assist for all ADLs  Comments: Called [facility] and spoke with Tresa Endo, OT for above information.     Hand Dominance        Extremity/Trunk Assessment   Upper Extremity  Assessment Upper Extremity Assessment: RUE deficits/detail;LUE deficits/detail RUE Deficits / Details: PROM shoulder flexion ~50, abdct ~60; elbow flex ~100, extension 0, supination to 0, full pronation, active wrist extension and flexion; active finger flexion with nearly making a fist. LUE Deficits / Details: PROM shoulder flexion ~40, abdct ~50; elbow flex ~90, extension 0, supination to ~45, full pronation, AAROM wrist extension and flexion; active finger flexion very limited    Lower Extremity Assessment Lower Extremity Assessment: RLE deficits/detail;LLE deficits/detail RLE Deficits / Details: hip flexion ~30, internal rotation to neutral; knee flexion ~30, full extension; ankle DF -45; pain noted at end range  of hip/knee flexion LLE Deficits / Details: hip flexion ~60, internal rotation ~ -10; knee flexion ~60, full extension; ankle DF ~ -10    Cervical / Trunk Assessment Cervical / Trunk Assessment: Other exceptions Cervical / Trunk Exceptions: head rotated slightly to his right; very limited neck rotation towards neutral  Communication   Communication: Tracheostomy (lip-speaks rather clearly)  Cognition Arousal/Alertness: Awake/alert Behavior During Therapy: WFL for tasks assessed/performed Overall Cognitive Status: Within Functional Limits for tasks assessed                                 General Comments: cognition not specifically tested      General Comments      Exercises Other Exercises Other Exercises: PROM to [facility] when appropriate x 3 reps to all joints/movements assessed   Assessment/Plan    PT Assessment Patent does not need any further PT services  PT Problem List         PT Treatment Interventions      PT Goals (Current goals can be found in the Care Plan section)  Acute Rehab PT Goals Patient Stated Goal: to return to [facility] PT Goal Formulation: All assessment and education complete, DC therapy    Frequency     Barriers to discharge        Co-evaluation               AM-PAC PT "6 Clicks" Mobility  Outcome Measure Help needed turning from your back to your side while in a flat bed without using bedrails?: Total Help needed moving from lying on your back to sitting on the side of a flat bed without using bedrails?: Total Help needed moving to and from a bed to a chair (including a wheelchair)?: Total Help needed standing up from a chair using your arms (e.g., wheelchair or bedside chair)?: Total Help needed to walk in hospital room?: Total Help needed climbing 3-5 steps with a railing? : Total 6 Click Score: 6    End of Session   Activity Tolerance: Patient tolerated treatment well Patient left: in bed;with call bell/phone  within reach (pt offered soft touch call light and he says they don't work for him) Nurse Communication: Other (comment) (nursing had been lifting OOB at SNF 2x/week into specialty chair with specialty mattress overlay for positioning.) PT Visit Diagnosis: Pain Pain - Right/Left: Right Pain - part of body: Hip;Knee    Time: 2831-5176 PT Time Calculation (min) (ACUTE ONLY): 23 min   Charges:   PT Evaluation $PT Eval Low Complexity: 1 Low PT Treatments $Therapeutic Exercise: 8-22 mins         [name], PT Pager [phone]   [name] 05/24/2021, 3:40 PM

## 2021-05-24 NOTE — Progress Notes (Signed)
PROGRESS NOTE  Andrew Rivera  OBS:[id number] DOB: [birth year] DOA: 04/23/2021 PCP: Larena Glassman, MD   Brief Narrative: 70 year old male with chronic trach/chronic vent at [facility] - vent side, not LTAC, history of constipation chronic irritation, prior pneumonia, critical illness myopathy functional quadriplegia, comes into the hospital with abdominal distention.  CT on admission showed colonic distention/ileus without obstruction or perforation.  GI consulted, he was placed on aggressive bowel regimen with resolution of his ileus.  GI signed off and eventually was transferred to the hospitalist service.  Patient had an event of hypotension and fever on 7/8, he was briefly on antibiotics but all cultures were negative and has been monitored off antibiotics since 7/10.  He has had no further issues, awaiting placement back.  Assessment & Plan: Active Problems:   Ileus (HCC)   Abdominal distension   Constipation   Hypokalemia   Chronic respiratory failure with hypoxia (HCC)  Colonic distention/ileus: he has a history of the same, he was admitted to the hospital and GI was consulted.  This was likely opioid induced, he had a movie prep with resolution of his symptoms.   - Continue scheduled bowel regimen. Ileus has durably resolved.   Functional quadriplegia, chronic respiratory failure, tracheostomy and vent dependent, PEG tube in place:  - All stable chronic problems with continuation of PTA regimen.   Hypokalemia/hypomagnesemia: Resolved.   Hypotension: No new anemia/evidence of bleeding. No new fever, leukocytosis, or nidus of infection. Volume status appears appropriate. 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  - Start low dose midodrine and monitor.  - Holding lisinopril.  Hyperkalemia:  - Given lokelma, DC'ed lisinopril as above. Resolved.    Anemia of chronic disease: No bleeding. Hgb stable.  - Check H/H  periodically.    Chronic low back pain, neuropathy, mood disorder:  - Continue home cymbalta, lyrica, fentanyl patch, ativan.   DVT prophylaxis: Lovenox Code Status: Full Family Communication: None at bedside Disposition Plan:  Status is: Inpatient  Remains inpatient appropriate because:Unsafe d/c plan. Stable for discharge, TOC working on placement  Dispo: The patient is from: [facility]              Anticipated d/c is to: [facility] awaiting bed availability.               Patient currently is medically stable to d/c.   Difficult to place patient No  Consultants:  PCCM  Subjective: No new complaints. Denies dyspnea, chest pain or bleeding. BP has remained soft without evidence of infection/dehydration. Having BMs.   Objective: Vitals:   05/24/21 0500 05/24/21 0727 05/24/21 0753 05/24/21 0927  BP:   (!) 93/43 (!) 139/58  Pulse:   70   Resp:   18   Temp:  98.5 F (36.9 C)    TempSrc:  Oral    SpO2:   100%   Weight: 86.1 kg     Height:        Intake/Output Summary (Last 24 hours) at 05/24/2021 1019 Last data filed at 05/24/2021 0800 Gross per 24 hour  Intake 1920 ml  Output 960 ml  Net 960 ml   Filed Weights   05/17/21 0347 05/23/21 0500 05/24/21 0500  Weight: 87 kg 86 kg 86.1 kg   Gen: 70 y.o. male in no distress Pulm: Nonlabored breathing on vent w/PS, 28% FiO2. Clear anteriorly. CV: Regular rate and rhythm. No murmur, rub, or gallop. No  JVD, no dependent edema. GI: Abdomen distended nontender with active bowel sounds.  Ext: Warm, no deformities Skin: No new rashes, lesions or ulcers on visualized skin. Neuro: Alert and oriented. No new focal neurological deficits. Very weak extremities Psych: Judgement and insight appear fair. Mood euthymic & affect congruent. Behavior is appropriate.    Scheduled Meds:  acetaminophen (TYLENOL) oral liquid 160 mg/5 mL  960 mg Per Tube Q8H   doxazosin  2 mg Per Tube Daily   DULoxetine  30 mg Oral BID   enoxaparin  (LOVENOX) injection  40 mg Subcutaneous Q24H   feeding supplement (PROSource TF)  45 mL Per Tube TID   fentaNYL  1 patch Transdermal Q72H   free water  100 mL Per Tube Q4H   mouth rinse  15 mL Mouth Rinse BID   melatonin  3 mg Per Tube QHS   midodrine  5 mg Per Tube TID WC   pantoprazole sodium  40 mg Per Tube Daily   polyethylene glycol  17 g Per Tube BID   pregabalin  100 mg Per Tube TID   sennosides  10 mL Per Tube BID   sodium chloride flush  10-40 mL Intracatheter Q12H   Continuous Infusions:  sodium chloride Stopped (05/02/21 0501)   feeding supplement (JEVITY 1.5 CAL/FIBER) 1,000 mL (05/24/21 0409)     LOS: 30 days   Time spent: 25 minutes.  Tyrone Nine, MD [facility] [email or web address] 05/24/2021, 10:19 AM

## 2021-05-25 DIAGNOSIS — R140 Abdominal distension (gaseous): Secondary | ICD-10-CM | POA: Diagnosis not present

## 2021-05-25 DIAGNOSIS — J9611 Chronic respiratory failure with hypoxia: Secondary | ICD-10-CM | POA: Diagnosis not present

## 2021-05-25 DIAGNOSIS — E876 Hypokalemia: Secondary | ICD-10-CM | POA: Diagnosis not present

## 2021-05-25 DIAGNOSIS — K5903 Drug induced constipation: Secondary | ICD-10-CM | POA: Diagnosis not present

## 2021-05-25 LAB — BASIC METABOLIC PANEL
Anion gap: 9 (ref 5–15)
BUN: 29 mg/dL — ABNORMAL HIGH (ref 8–23)
CO2: 25 mmol/L (ref 22–32)
Calcium: 8.7 mg/dL — ABNORMAL LOW (ref 8.9–10.3)
Chloride: 102 mmol/L (ref 98–111)
Creatinine, Ser: <0.3 mg/dL — ABNORMAL LOW (ref 0.61–1.24)
Glucose, Bld: 115 mg/dL — ABNORMAL HIGH (ref 70–99)
Potassium: 4 mmol/L (ref 3.5–5.1)
Sodium: 136 mmol/L (ref 135–145)

## 2021-05-25 MED ORDER — HYDROMORPHONE HCL 2 MG PO TABS
2.0000 mg | ORAL_TABLET | ORAL | Status: DC | PRN
Start: 2021-05-25 — End: 2021-06-09
  Administered 2021-05-26 – 2021-06-09 (×27): 2 mg
  Filled 2021-05-25 (×30): qty 1

## 2021-05-25 NOTE — Progress Notes (Signed)
PROGRESS NOTE  Andrew Rivera  AVW:[id number] DOB: [birth year] DOA: 04/23/2021 PCP: Larena Glassman, MD   Brief Narrative: 70 year old male with chronic trach/chronic vent at [facility] - vent side, not LTAC, history of constipation chronic irritation, prior pneumonia, critical illness myopathy functional quadriplegia, comes into the hospital with abdominal distention.  CT on admission showed colonic distention/ileus without obstruction or perforation.  GI consulted, he was placed on aggressive bowel regimen with resolution of his ileus.  GI signed off and eventually was transferred to the hospitalist service.  Patient had an event of hypotension and fever on 7/8, he was briefly on antibiotics but all cultures were negative and has been monitored off antibiotics since 7/10.  He has had no further issues, awaiting placement back.  Assessment & Plan: Active Problems:   Ileus (HCC)   Abdominal distension   Constipation   Hypokalemia   Chronic respiratory failure with hypoxia (HCC)  Colonic distention/ileus: he has a history of the same, he was admitted to the hospital and GI was consulted.  This was likely opioid induced, he had a movie prep with resolution of his symptoms.   - Continue scheduled bowel regimen. Ileus has durably resolved.   Functional quadriplegia, chronic respiratory failure, tracheostomy and vent dependent, PEG tube in place:  - All stable chronic problems with continuation of PTA regimen.   Hypokalemia/hypomagnesemia: Resolved.   Hypotension: No new anemia/evidence of bleeding. No new fever, leukocytosis, or nidus of infection. Volume status appears appropriate. 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  - Start low dose midodrine and monitor.  - Holding lisinopril.  Hyperkalemia:  - Given lokelma, DC'ed lisinopril as above. Resolved.    Anemia of chronic disease: No bleeding. Hgb stable.  - Check H/H  periodically.    Chronic low back pain, neuropathy, mood disorder:  - Continue home lyrica, fentanyl patch, ativan. Pt was not aware of cymbalta and does not feel he requires this, will DC for now.  DVT prophylaxis: Lovenox Code Status: Full Family Communication: Wife at bedside Disposition Plan:  Status is: Inpatient  Remains inpatient appropriate because:Unsafe d/c plan. Stable for discharge, TOC working on placement  Dispo: The patient is from: [facility]              Anticipated d/c is to: [facility] awaiting bed availability.               Patient currently is medically stable to d/c.   Difficult to place patient No  Consultants:  PCCM  Subjective: No new complaints. No overnight events.   Objective: Vitals:   05/25/21 0919 05/25/21 1100 05/25/21 1120 05/25/21 1121  BP: (!) 102/42     Pulse:  70    Resp:  20    Temp:    98.2 F (36.8 C)  TempSrc:    Oral  SpO2:  99% 99%   Weight:      Height:        Intake/Output Summary (Last 24 hours) at 05/25/2021 1437 Last data filed at 05/25/2021 1117 Gross per 24 hour  Intake 1520 ml  Output 900 ml  Net 620 ml   Filed Weights   05/23/21 0500 05/24/21 0500 05/25/21 0441  Weight: 86 kg 86.1 kg 86.2 kg   Gen: 70 y.o. male in no distress Pulm: Nonlabored breathing on 28% FiO2 vent-driven. Clear anteriorly. CV: Regular rate and rhythm. No murmur, rub, or gallop. No JVD,  no significant dependent edema. GI: Abdomen distended with active bowel sounds, nontender.  Ext: Warm, no deformities, decreased muscle tone and bulk. Skin: No new rashes, lesions or ulcers on visualized skin. Neuro: Alert and oriented. No new focal neurological deficits. Psych: Judgement and insight appear wnl. Mood euthymic & affect congruent. Behavior is appropriate.    Scheduled Meds:  acetaminophen (TYLENOL) oral liquid 160 mg/5 mL  960 mg Per Tube Q8H   doxazosin  2 mg Per Tube Daily   enoxaparin (LOVENOX) injection  40 mg Subcutaneous  Q24H   feeding supplement (PROSource TF)  45 mL Per Tube TID   fentaNYL  1 patch Transdermal Q72H   free water  100 mL Per Tube Q4H   mouth rinse  15 mL Mouth Rinse BID   melatonin  3 mg Per Tube QHS   midodrine  5 mg Per Tube TID WC   pantoprazole sodium  40 mg Per Tube Daily   polyethylene glycol  17 g Per Tube BID   pregabalin  100 mg Per Tube TID   sennosides  10 mL Per Tube BID   sodium chloride flush  10-40 mL Intracatheter Q12H   Continuous Infusions:  sodium chloride Stopped (05/02/21 0501)   feeding supplement (JEVITY 1.5 CAL/FIBER) 1,000 mL (05/24/21 2208)     LOS: 31 days   Time spent: 25 minutes.  Tyrone Nine, MD [facility] [email or web address] 05/25/2021, 2:37 PM

## 2021-05-25 NOTE — Progress Notes (Signed)
Nutrition Follow-up  DOCUMENTATION CODES:   Not applicable  INTERVENTION:   Continue TF via PEG: Jevity 1.5 at 55 ml/h (1320 ml/d) Prosource TF 45 ml TID  Provides 2100 kcal, 117 gm protein, 1003 ml free water daily  Continue free water flushes 100 ml every 4 hours for total of 1603 ml per day. Recommend adjust free water flushes as needed based on sodium levels.   NUTRITION DIAGNOSIS:   Inadequate oral intake related to inability to eat as evidenced by NPO status.  Ongoing   GOAL:   Patient will meet greater than or equal to 90% of their needs  Met with TF  MONITOR:   I & O's, TF tolerance  REASON FOR ASSESSMENT:   Ventilator    ASSESSMENT:   Pt admitted from [facility] with increasing abdominal distention and abdominal pain due to constipation and ileus. Pt with PMH of chronic pain, septic shock, chronic respiratory failure, recent ED visit for constipation, hx spinal surgeries, MSSA bacteriemia and pressure injury.  Discussed patient in ICU rounds and with RN today. Patient is medically stable for discharge; awaiting bed availability at [facility]. Patient is tolerating TF at goal rate. Currently receiving Jevity 1.5 at 55 ml/h with Prosource TF 45 ml TID via PEG.  Patient remains intubated on ventilator support via trach MV: 9.6 L/min Temp (24hrs), Avg:98.5 F (36.9 C), Min:98 F (36.7 C), Max:98.8 F (37.1 C)   Labs reviewed.  Medications reviewed and include Protonix, Senokot.  Weight 86.2 kg today Admission weight 90.9 kg on 7/3. I/O -3.1 L since admission UOP 1400 ml x 24 hours  Diet Order:   Diet Order             Diet NPO time specified  Diet effective now                   EDUCATION NEEDS:   Not appropriate for education at this time  Skin:  Skin Assessment: Reviewed RN Assessment  Last BM:  8/3 type 6  Height:   Ht Readings from Last 1 Encounters:  05/19/21 5' 8"  (1.727 m)    Weight:   Wt Readings from Last 1 Encounters:   05/25/21 86.2 kg    BMI:  Body mass index is 28.89 kg/m.  Estimated Nutritional Needs:   Kcal:  1900-2100  Protein:  100-125 grams  Fluid:  >1.9 L/day    Lucas Mallow, RD, LDN, CNSC Please refer to Amion for contact information.

## 2021-05-26 DIAGNOSIS — J9611 Chronic respiratory failure with hypoxia: Secondary | ICD-10-CM | POA: Diagnosis not present

## 2021-05-26 DIAGNOSIS — K5903 Drug induced constipation: Secondary | ICD-10-CM | POA: Diagnosis not present

## 2021-05-26 DIAGNOSIS — E876 Hypokalemia: Secondary | ICD-10-CM | POA: Diagnosis not present

## 2021-05-26 DIAGNOSIS — R140 Abdominal distension (gaseous): Secondary | ICD-10-CM | POA: Diagnosis not present

## 2021-05-26 DIAGNOSIS — J9601 Acute respiratory failure with hypoxia: Secondary | ICD-10-CM | POA: Diagnosis not present

## 2021-05-26 MED ORDER — MIDODRINE HCL 5 MG PO TABS
10.0000 mg | ORAL_TABLET | Freq: Three times a day (TID) | ORAL | Status: DC
  Administered 2021-05-26 – 2021-06-09 (×45): 10 mg
  Filled 2021-05-26 (×46): qty 2

## 2021-05-26 NOTE — Progress Notes (Addendum)
   NAME:  Andrew Rivera, MRN:  [id number], DOB:  11-26-1950, LOS: 32 ADMISSION DATE:  04/23/2021, CONSULTATION DATE:  04/24/21 REFERRING MD:  ED, CHIEF COMPLAINT:  abdominal pain   History of Present Illness:  70 y/o male, former smoker, with hx of PNA and critical illness myopathy presented with abdominal distention and pain.  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   Pertinent  Medical History  Chronic pain, IJ thromboembolism, Septic shock, Pneumonia, Constipation with ileus, Critical illness myopathy, MSSA Bacteremia in November 2020, Pressure wounds  Significant Hospital Events: Including procedures, antibiotic start and stop dates in addition to other pertinent events   7/02 Admit 7/08 fever 101.7, start vancomycin/cefepime 7/12 trach collar trial did not last long 7/14 Ileus resolved, tolerating PSV weans 7/17 Routine trach change #6 cuffed Shiley 7/22 ATC briefly but failed due to dyspnea  Interim History / Subjective:   No significant events Denies pain Positional cuff leak around trach; 6 shiley in place. Last trach change 7/17. Patient sats stable and in no distress. Patient states it doesn't really bother him. Remains on PRVC 550 X 16 +5 28% at night and doing well with PSV wean during the day. Afebrile  Objective   Blood pressure (!) 102/38, pulse (!) 57, temperature 98.7 F (37.1 C), temperature source Axillary, resp. rate 16, height 5\' 8"  (1.727 m), weight 87.7 kg, SpO2 98 %.    Vent Mode: PRVC FiO2 (%):  [28 %] 28 % Set Rate:  [16 bmp] 16 bmp Vt Set:  [550 mL] 550 mL PEEP:  [5 cmH20] 5 cmH20 Pressure Support:  [15 cmH20] 15 cmH20 Plateau Pressure:  [12 cmH20-18 cmH20] 12 cmH20   Intake/Output Summary (Last 24 hours) at 05/26/2021 0724 Last data filed at 05/26/2021 0700 Gross per 24 hour  Intake 2325 ml  Output 805 ml  Net 1520 ml    Filed Weights   05/24/21 0500 05/25/21  0441 05/26/21 0500  Weight: 86.1 kg 86.2 kg 87.7 kg    Examination:  General:   NAD on mech vent HEENT: MM pink/moist; trach in place Neuro: Aox3 CV: s1s2, RRR , no m/r/g PULM:  dim clear BS bilaterally; on mech vent PRVC; 6 shiley in place GI: soft, bsx4 active  Extremities: warm/dry, no edema  Skin: no rashes or lesions    Resolved Hospital Problem list   Colonic Ileus Hypokalemia  Assessment & Plan:   Chronic hypoxic respiratory failure in setting of critical illness myopathy, along with 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 Tracheostomy and ventilator dependence. P: -continue PSV wean trials during the day and rest on PRVC at night -continue routine trach care -VAP prevention in place -TOC following for placement to [facility] when bed available -will have RT change trach to 6 distal XLT due to continuous cuff leak   DM type 2 Anemia of critical illness and chronic disease Mood disorder with insomnia Chronic pain P: -per TRH   ABD Pain/Distention in setting of Ileus- improved P: -per TRH / GI    PCCM available as needed   JD 07/26/21 [facility] 05/26/2021, 7:24 AM  Please see [email or web address] for pager details.  From 7A-7P if no response, please call [phone]. After hours, please call ELink [phone].

## 2021-05-26 NOTE — Progress Notes (Signed)
PROGRESS NOTE  Andrew Rivera  BZJ:[id number] DOB: 15-Oct-1951 DOA: 04/23/2021 PCP: Larena Glassman, MD   Brief Narrative: 70 year old male with chronic trach/chronic vent at [facility] - vent side, not LTAC, history of constipation chronic irritation, prior pneumonia, critical illness myopathy functional quadriplegia, comes into the hospital with abdominal distention.  CT on admission showed colonic distention/ileus without obstruction or perforation.  GI consulted, he was placed on aggressive bowel regimen with resolution of his ileus.  GI signed off and eventually was transferred to the hospitalist service.  Patient had an event of hypotension and fever on 7/8, he was briefly on antibiotics but all cultures were negative and has been monitored off antibiotics since 7/10.  He has had no further issues, awaiting placement back.  Assessment & Plan: Active Problems:   Ileus (HCC)   Abdominal distension   Constipation   Hypokalemia   Chronic respiratory failure with hypoxia (HCC)  Colonic distention/ileus: he has a history of the same, he was admitted to the hospital and GI was consulted.  This was likely opioid induced, he had a movie prep with resolution of his symptoms.   - Continue scheduled bowel regimen. Ileus has durably resolved.   Functional quadriplegia, chronic respiratory failure, tracheostomy and vent dependent, PEG tube in place:  - All stable chronic problems with continuation of PTA regimen.   Hypokalemia/hypomagnesemia: Resolved.   Hypotension: No new anemia/evidence of bleeding. No new fever, leukocytosis, or nidus of infection. Volume status appears appropriate. 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  - Increase midodrine dose with persistent MAP <65.  - Holding lisinopril.  Hyperkalemia:  - Given lokelma, DC'ed lisinopril as above. Resolved.    Anemia of chronic disease: No bleeding. Hgb stable.  - Check H/H  periodically.    Chronic low back pain, neuropathy, mood disorder:  - Continue home lyrica, fentanyl patch, ativan. Pt was not aware of cymbalta and does not feel he requires this, will DC for now.  DVT prophylaxis: Lovenox Code Status: Full Family Communication: None at bedside Disposition Plan:  Status is: Inpatient  Remains inpatient appropriate because:Unsafe d/c plan. Stable for discharge, TOC working on placement  Dispo: The patient is from: [facility]              Anticipated d/c is to: [facility] awaiting bed availability.               Patient currently is medically stable to d/c.   Difficult to place patient No  Consultants:  PCCM  Subjective: No new issues. BP low but asymptomatic. No dyspnea or fever.  Objective: Vitals:   05/26/21 0500 05/26/21 0739 05/26/21 0742 05/26/21 0800  BP:  (!) 102/38  (!) 117/41  Pulse:  63  66  Resp:  17  (!) 21  Temp:   98.3 F (36.8 C)   TempSrc:   Oral   SpO2:  98%  97%  Weight: 87.7 kg     Height:        Intake/Output Summary (Last 24 hours) at 05/26/2021 1029 Last data filed at 05/26/2021 0940 Gross per 24 hour  Intake 2285 ml  Output 655 ml  Net 1630 ml   Filed Weights   05/24/21 0500 05/25/21 0441 05/26/21 0500  Weight: 86.1 kg 86.2 kg 87.7 kg   Gen: 70 y.o. male in no distress Pulm: Nonlabored breathing PSV, clear anteriorly. CV: Regular rate and rhythm. No murmur,  rub, or gallop. No definite JVD, no pitting dependent edema. GI: Abdomen distended with hyperactive bowel sounds, no tenderness.   Ext: Warm, no deformities Skin: No new rashes, lesions or ulcers on visualized skin. Neuro: Alert and oriented. Quadriplegic without new focal neurological deficits. Psych: Judgement and insight appear fair. Mood euthymic & affect congruent. Behavior is appropriate.    Scheduled Meds:  acetaminophen (TYLENOL) oral liquid 160 mg/5 mL  960 mg Per Tube Q8H   doxazosin  2 mg Per Tube Daily   enoxaparin (LOVENOX)  injection  40 mg Subcutaneous Q24H   feeding supplement (PROSource TF)  45 mL Per Tube TID   fentaNYL  1 patch Transdermal Q72H   free water  100 mL Per Tube Q4H   mouth rinse  15 mL Mouth Rinse BID   melatonin  3 mg Per Tube QHS   midodrine  10 mg Per Tube TID WC   pantoprazole sodium  40 mg Per Tube Daily   polyethylene glycol  17 g Per Tube BID   pregabalin  100 mg Per Tube TID   sennosides  10 mL Per Tube BID   sodium chloride flush  10-40 mL Intracatheter Q12H   Continuous Infusions:  sodium chloride Stopped (05/02/21 0501)   feeding supplement (JEVITY 1.5 CAL/FIBER) 1,000 mL (05/25/21 2111)     LOS: 32 days   Time spent: 25 minutes.  Tyrone Nine, MD [facility] [email or web address] 05/26/2021, 10:29 AM

## 2021-05-26 NOTE — Procedures (Signed)
Bedside Tracheostomy Insertion Procedure Note   Patient Details:   Name: DEMTRIUS ROUNDS DOB: 10/12/1951 MRN: [id number]  Procedure: Tracheostomy  Pre Procedure Assessment: ET Tube Size: ET Tube secured at lip (cm): Bite block in place: No Breath Sounds: Clear and Diminished  Post Procedure Assessment: BP (!) 109/42   Pulse 69   Temp (!) 97.5 F (36.4 C) (Oral)   Resp 15   Ht 5\' 8"  (1.727 m)   Wt 87.7 kg   SpO2 97%   BMI 29.40 kg/m  O2 sats: stable throughout Complications: No apparent complications Patient did tolerate procedure well Tracheostomy Brand:Shiley Tracheostomy Style:Cuffed Tracheostomy Size:  6XLT Tracheostomy Secured Tracheostomy Placement Confirmation:Trach cuff visualized and in place  Trache changed to 6XLT Shiley. Good color change and equal breath sounds. No complications, RT will continue to monitor    KJZ:[id number] 05/26/2021, 2:19 PM

## 2021-05-26 NOTE — TOC Progression Note (Signed)
Transition of Care [facility]) - Progression Note    Patient Details  Name: Andrew Rivera MRN: [id number] Date of Birth: [birth year]  [facility] CM/SW Contact  Ralene Bathe, LCSWA Phone Number: 05/26/2021, 10:43 AM  Clinical Narrative:    10:31- CSW was informed by Prem that there are currently no beds available at [facility] ([facility]).   Expected Discharge Plan: Long Term Acute Care (LTAC) Barriers to Discharge: Insurance Authorization  Expected Discharge Plan and Services Expected Discharge Plan: Long Term Acute Care (LTAC)         Expected Discharge Date: 05/17/21                                     Social Determinants of Health (SDOH) Interventions    Readmission Risk Interventions No flowsheet data found.

## 2021-05-27 DIAGNOSIS — J9611 Chronic respiratory failure with hypoxia: Secondary | ICD-10-CM | POA: Diagnosis not present

## 2021-05-27 DIAGNOSIS — E876 Hypokalemia: Secondary | ICD-10-CM | POA: Diagnosis not present

## 2021-05-27 DIAGNOSIS — K5903 Drug induced constipation: Secondary | ICD-10-CM | POA: Diagnosis not present

## 2021-05-27 DIAGNOSIS — R140 Abdominal distension (gaseous): Secondary | ICD-10-CM | POA: Diagnosis not present

## 2021-05-27 MED ORDER — WHITE PETROLATUM EX OINT
TOPICAL_OINTMENT | CUTANEOUS | Status: DC | PRN
  Administered 2021-05-28: 0.2 via TOPICAL
  Filled 2021-05-27 (×2): qty 28.35

## 2021-05-27 NOTE — Progress Notes (Signed)
PROGRESS NOTE  Andrew Rivera  KCL:[id number] DOB: 10-31-1950 DOA: 04/23/2021 PCP: Larena Glassman, MD   Brief Narrative: 70 year old male with chronic trach/chronic vent at [facility] - vent side, not LTAC, history of constipation chronic irritation, prior pneumonia, critical illness myopathy functional quadriplegia, comes into the hospital with abdominal distention.  CT on admission showed colonic distention/ileus without obstruction or perforation.  GI consulted, he was placed on aggressive bowel regimen with resolution of his ileus.  GI signed off and eventually was transferred to the hospitalist service.  Patient had an event of hypotension and fever on 7/8, he was briefly on antibiotics but all cultures were negative and has been monitored off antibiotics since 7/10.  He has had no further issues, awaiting placement back.  Assessment & Plan: Active Problems:   Ileus (HCC)   Abdominal distension   Constipation   Hypokalemia   Chronic respiratory failure with hypoxia (HCC)  Colonic distention/ileus: he has a history of the same, he was admitted to the hospital and GI was consulted.  This was likely opioid induced, he had a movie prep with resolution of his symptoms.   - Continue scheduled bowel regimen. Ileus has durably resolved.   Functional quadriplegia, chronic respiratory failure, tracheostomy and vent dependent, PEG tube in place:  - All stable chronic problems with continuation of PTA regimen. - Continue PT efforts to maximize functional ability   Hypokalemia/hypomagnesemia: Resolved.   Hypotension: No new anemia/evidence of bleeding. No new fever, leukocytosis, or nidus of infection. Volume status appears appropriate. 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  - Continue midodrine. Still hypotensive but perfusing well. - Holding lisinopril.  Hyperkalemia:  - Given lokelma, DC'ed lisinopril as above. Resolved.     Anemia of chronic disease: No bleeding. Hgb stable.  - Check H/H periodically.    Chronic low back pain, neuropathy, mood disorder:  - Continue home lyrica, fentanyl patch, ativan. Pt was not aware of cymbalta and does not feel he requires this, will DC for now.  DVT prophylaxis: Lovenox Code Status: Full Family Communication: None at bedside Disposition Plan:  Status is: Inpatient  Remains inpatient appropriate because:Unsafe d/c plan. Stable for discharge, TOC working on placement  Dispo: The patient is from: [facility]              Anticipated d/c is to: [facility] awaiting bed availability.               Patient currently is medically stable to d/c.   Difficult to place patient No  Consultants:  PCCM  Subjective: No pain, lightheadedness, dyspnea. Trach exchanged. No fevers, no increased secretions, changes in urine or bowels, or new wounds/rashes. BP low but asymptomatic.  Objective: Vitals:   05/27/21 0750 05/27/21 0800 05/27/21 0805 05/27/21 0900  BP:  (!) 113/43    Pulse:  (!) 57  (!) 53  Resp:  16  16  Temp:   98.6 F (37 C)   TempSrc:   Oral   SpO2: 98% 98%  98%  Weight:      Height:        Intake/Output Summary (Last 24 hours) at 05/27/2021 0930 Last data filed at 05/27/2021 0900 Gross per 24 hour  Intake 1505 ml  Output 612 ml  Net 893 ml   Filed Weights   05/25/21 0441 05/26/21 0500 05/27/21 0500  Weight: 86.2 kg 87.7 kg 87.9 kg   Gen: 70  y.o. male in no distress Pulm: Nonlabored on pressure support, clear bilaterally. CV: Regular rate and rhythm. No murmur, rub, or gallop. No JVD, trace dependent edema. GI: Abdomen soft, slightly less distended with normoactive bowel sounds, nontender.  Ext: Warm, dry, decreased muscle bulk and tone. Skin: No new rashes, lesions or ulcers on visualized skin. Neuro: Alert and oriented. Incomplete quadriplegia. No new focal neurological deficits. Psych: Judgement and insight appear fair. Mood euthymic &  affect congruent. Behavior is appropriate.    Scheduled Meds:  acetaminophen (TYLENOL) oral liquid 160 mg/5 mL  960 mg Per Tube Q8H   doxazosin  2 mg Per Tube Daily   enoxaparin (LOVENOX) injection  40 mg Subcutaneous Q24H   feeding supplement (PROSource TF)  45 mL Per Tube TID   fentaNYL  1 patch Transdermal Q72H   free water  100 mL Per Tube Q4H   mouth rinse  15 mL Mouth Rinse BID   melatonin  3 mg Per Tube QHS   midodrine  10 mg Per Tube TID WC   pantoprazole sodium  40 mg Per Tube Daily   polyethylene glycol  17 g Per Tube BID   pregabalin  100 mg Per Tube TID   sennosides  10 mL Per Tube BID   sodium chloride flush  10-40 mL Intracatheter Q12H   Continuous Infusions:  sodium chloride Stopped (05/02/21 0501)   feeding supplement (JEVITY 1.5 CAL/FIBER) 1,000 mL (05/26/21 1843)     LOS: 33 days   Time spent: 25 minutes.  Tyrone Nine, MD [facility] [email or web address] 05/27/2021, 9:30 AM

## 2021-05-27 NOTE — TOC Progression Note (Addendum)
Transition of Care [facility]) - Progression Note    Patient Details  Name: Andrew Rivera MRN: [id number] Date of Birth: 1951-09-01  Transition of [facility]) CM/SW Contact  Ralene Bathe, LCSWA Phone Number: 05/27/2021, 11:03 AM  Clinical Narrative:    10:55- CSW contacted Prem at [facility] ([facility])  and is awaiting an answer about bed availability on this day.  12:07- CSW informed that there are no beds available.   Expected Discharge Plan: Long Term Acute Care (LTAC) Barriers to Discharge: Insurance Authorization  Expected Discharge Plan and Services Expected Discharge Plan: Long Term Acute Care (LTAC)         Expected Discharge Date: 05/17/21                                     Social Determinants of Health (SDOH) Interventions    Readmission Risk Interventions No flowsheet data found.

## 2021-05-28 DIAGNOSIS — K5903 Drug induced constipation: Secondary | ICD-10-CM | POA: Diagnosis not present

## 2021-05-28 DIAGNOSIS — K567 Ileus, unspecified: Secondary | ICD-10-CM | POA: Diagnosis not present

## 2021-05-28 DIAGNOSIS — J9611 Chronic respiratory failure with hypoxia: Secondary | ICD-10-CM | POA: Diagnosis not present

## 2021-05-28 DIAGNOSIS — R140 Abdominal distension (gaseous): Secondary | ICD-10-CM | POA: Diagnosis not present

## 2021-05-28 NOTE — Progress Notes (Signed)
Pt refused labs this AM.

## 2021-05-28 NOTE — Progress Notes (Signed)
Patient's old fentanyl patch from 8/3 was removed and placed in the sharps box (verified the waste with Misty Stanley, Charity fundraiser). New fentanyl patch applied per order.

## 2021-05-28 NOTE — Evaluation (Signed)
Physical Therapy Evaluation Patient Details Name: Andrew Rivera MRN: [id number] DOB: 1951-07-18 Today's Date: 05/28/2021   History of Present Illness  70 year old male  comes into the hospital 04/23/21 with abdominal distention. GI consulted, he was placed on aggressive bowel regimen with resolution of his ileus. Episode of hypotension and fever 7/8. Trach exchange 8/4  PMH chronic trach/chronic vent at [facility] - vent side, not LTAC, history of constipation chronic irritation, prior pneumonia, critical illness myopathy functional quadriplegia  Clinical Impression   Pt admitted secondary to problem above with deficits below. PTA patient was not currently receiving PT at [facility] (per wife had been discharged from therapies "months ago"). When he was last working with therapies, he was sitting EOB and working on standing. He has had a functional decline since discharged as nursing no longer sits him EOB and only uses a lift for OOB 2x/week. Lengthy discussion with wife via phone that do not anticipate pt will ever regain ability to stand again based on his contractures, but may be able to regain ability to sit EOB and possibly work towards transferring without a lift. Again, emphasized that because she cannot specify how long it has been since pt participated in PT and these activities, that it is unlikely he will make functional gains with PT but will initiate a trial of therapy.  Pt currently requires total assist for all mobility and could not tolerate chair position in bed due to decr BP.  Patietn may benefit from PT to address problems listed below.Will continue to follow acutely to maximize functional mobility independence and safety.    Note-pt advanced from supine with HOB 30 to HOB 45 and feet lowered 20 degrees with MAP dropping from 70 to 61. Feet elevated and PROM/AAROM exercises performed while continuing to assess BP. MAP only increased to 63 with these interventions. HOB lowered to 35  degrees and SCDs resumed with MAP 63 on departure.      Follow Up Recommendations Other (comment) (trial of acute PT to see if pt can make progress with upright tolerance)    Equipment Recommendations  None recommended by PT    Recommendations for Other Services       Precautions / Restrictions Precautions Precautions: Other (comment) Precaution Comments: chronic trach      Mobility  Bed Mobility Overal bed mobility: Needs Assistance             General bed mobility comments: at rest HOB 30; elevated toward chair position with HOB 45 and legs lowered ~20 degrees with drop in BP; continued to monitor BP with ROM/exercises and ultimately had to return to [facility] 30 to maintain MAP of 70    Transfers                    Ambulation/Gait                Stairs            Wheelchair Mobility    Modified Rankin (Stroke Patients Only)       Balance                                             Pertinent Vitals/Pain Pain Assessment: Faces Faces Pain Scale: Hurts whole lot Pain Location: rt thigh with RLE PROM Pain Descriptors / Indicators: Discomfort;Grimacing Pain Intervention(s): Limited activity within patient's tolerance;Monitored  during session;Repositioned    Home Living Family/patient expects to be discharged to:: Skilled nursing facility                 Additional Comments: [facility]    Prior Function Level of Independence: Needs assistance   Gait / Transfers Assistance Needed: pt was not receiving PT at [facility]; nursing was lifting pt OOB to geri-chair 2x per week. The chair is fitted with a "purple mattress" for positioning and comfort. 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 Patient does not self feed as he no longer eats by mouth. total assist for all ADLs  Comments: 8/2 Called [facility] and spoke with Tresa Endo, OT for above information. 8/6 spoke with Wife, [name]     Hand Dominance   Dominant Hand: Right    Extremity/Trunk Assessment   Upper Extremity Assessment Upper Extremity Assessment: RUE deficits/detail;LUE deficits/detail RUE Deficits / Details: PROM shoulder flexion ~50, abdct ~60; elbow flex ~1300, extension 0, supination to 0, full pronation, active wrist extension and flexion; active finger flexion with nearly making a fist. LUE Deficits / Details: PROM shoulder flexion ~40, abdct ~50; elbow flex ~120, extension 0, supination to ~45, full pronation, AAROM wrist extension and flexion; active finger flexion very limited    Lower Extremity Assessment Lower Extremity Assessment: RLE deficits/detail;LLE deficits/detail RLE Deficits / Details: hip flexion ~30, internal rotation to neutral; knee flexion ~30, full extension; ankle DF -45; pain noted at end range of hip/knee flexion LLE Deficits / Details: hip flexion ~60, internal rotation ~ -10; knee flexion ~60, full extension; ankle DF ~ -10    Cervical / Trunk Assessment Cervical / Trunk Assessment: Other exceptions Cervical / Trunk Exceptions: head rotated slightly to his right; very limited neck rotation towards neutral  Communication   Communication: Tracheostomy (lip-speaks rather clearly)  Cognition Arousal/Alertness: Awake/alert Behavior During Therapy: WFL for tasks assessed/performed Overall Cognitive Status: Within Functional Limits for tasks assessed                                 General Comments: cognition not specifically tested; lipspeaks and appropriate with questions asked      General Comments      Exercises Other Exercises Other Exercises: PROM to AAROM when appropriate x 3 reps to all joints/movements assessed; RUE and RLE demonstrating active  assisted movement   Assessment/Plan    PT Assessment Patient needs continued PT services  PT Problem List         PT Treatment Interventions Functional mobility training;Therapeutic activities;Therapeutic exercise;Patient/family education    PT Goals (Current goals can be found in the Care Plan section)  Acute Rehab PT Goals Patient Stated Goal: to return to [facility] PT Goal Formulation: With patient Time For Goal Achievement: 06/11/21 Potential to Achieve Goals: Poor    Frequency Min 2X/week   Barriers to discharge        Co-evaluation               AM-PAC PT "6 Clicks" Mobility  Outcome Measure Help needed turning from your back to your side while in a flat bed without using bedrails?: Total Help needed moving from lying on your back to sitting on  the side of a flat bed without using bedrails?: Total Help needed moving to and from a bed to a chair (including a wheelchair)?: Total Help needed standing up from a chair using your arms (e.g., wheelchair or bedside chair)?: Total Help needed to walk in hospital room?: Total Help needed climbing 3-5 steps with a railing? : Total 6 Click Score: 6    End of Session   Activity Tolerance: Treatment limited secondary to medical complications (Comment) (limited by decr BP with attempts at chair position) Patient left: in bed;with call bell/phone within reach (pt's callbell modified by RN and pt able to press with index finger)   PT Visit Diagnosis: Pain Pain - Right/Left: Right Pain - part of body: Hip;Knee    Time: 3428-7681 PT Time Calculation (min) (ACUTE ONLY): 36 min   Charges:   PT Evaluation $PT Eval Moderate Complexity: 1 Mod PT Treatments $Therapeutic Exercise: 8-22 mins         [name], PT Pager [phone]   Scherrie November Faiz Weber 05/28/2021, 1:16 PM

## 2021-05-28 NOTE — Progress Notes (Signed)
PROGRESS NOTE  Andrew Rivera  WGN:[id number] DOB: Dec 13, 1950 DOA: 04/23/2021 PCP: Larena Glassman, MD   Brief Narrative: 70 year old male with chronic trach/chronic vent at [facility] - vent side, not LTAC, history of constipation chronic irritation, prior pneumonia, critical illness myopathy functional quadriplegia, comes into the hospital with abdominal distention.  CT on admission showed colonic distention/ileus without obstruction or perforation.  GI consulted, he was placed on aggressive bowel regimen with resolution of his ileus.  GI signed off and eventually was transferred to the hospitalist service.  Patient had an event of hypotension and fever on 7/8, he was briefly on antibiotics but all cultures were negative and has been monitored off antibiotics since 7/10.  He has had no further issues, awaiting placement back.  Assessment & Plan: Active Problems:   Ileus (HCC)   Abdominal distension   Constipation   Hypokalemia   Chronic respiratory failure with hypoxia (HCC)   Colonic distention/ileus: Recurrent history of the same, he was admitted to the hospital and GI was consulted.   - Likely opioid induced, he had a moviprep with resolution of his symptoms.   - Continue scheduled bowel regimen. Ileus has essentially resolved.   Functional quadriplegia, chronic respiratory failure, tracheostomy and vent dependent, PEG tube in place:  - All stable chronic problems with continuation of PTA regimen. - Continue PT efforts to maximize functional ability   Hypokalemia/hypomagnesemia: Resolved.   Hypotension: No new anemia/evidence of bleeding. No new fever, leukocytosis, or nidus of infection. Volume status appears appropriate. 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  - Continue midodrine. Still hypotensive but perfusing well. - Holding lisinopril.  Hyperkalemia:  - Given lokelma, DC'ed lisinopril as above. Resolved.     Anemia of chronic disease: No bleeding. Hgb stable.  - Check H/H periodically.    Chronic low back pain, neuropathy, mood disorder:  - Continue home lyrica, fentanyl patch, ativan. Pt was not aware of cymbalta and does not feel he requires this, will DC for now.  DVT prophylaxis: Lovenox Code Status: Full Family Communication: None at bedside Disposition Plan:  Status is: Inpatient  Remains inpatient appropriate because:Unsafe d/c plan. Stable for discharge, TOC working on placement  Dispo: The patient is from: [facility]              Anticipated d/c is to: [facility] awaiting bed availability.               Patient currently is medically stable to d/c.   Difficult to place patient No  Consultants:  PCCM  Subjective: No acute issues or events overnight  Objective: Vitals:   05/28/21 0400 05/28/21 0402 05/28/21 0500 05/28/21 0600  BP: (!) 103/41   (!) 107/41  Pulse: (!) 51 (!) 53  (!) 57  Resp: 16 16  17   Temp: 98.9 F (37.2 C)     TempSrc: Oral     SpO2: 99% 99%  97%  Weight:   87.2 kg   Height:        Intake/Output Summary (Last 24 hours) at 05/28/2021 0717 Last data filed at 05/28/2021 0700 Gross per 24 hour  Intake 3615 ml  Output 1110 ml  Net 2505 ml    Filed Weights   05/26/21 0500 05/27/21 0500 05/28/21 0500  Weight: 87.7 kg 87.9 kg 87.2 kg   Gen: 70 y.o. male in no distress Pulm: Nonlabored on pressure support, clear bilaterally. CV: Regular rate  and rhythm. No murmur, rub, or gallop. No JVD, trace dependent edema. GI: Abdomen soft, slightly less distended with normoactive bowel sounds, nontender.  Ext: Warm, dry, decreased muscle bulk and tone. Skin: No new rashes, lesions or ulcers on visualized skin. Neuro: Alert and oriented. Incomplete quadriplegia. No new focal neurological deficits. Psych: Judgement and insight appear fair. Mood euthymic & affect congruent. Behavior is appropriate.    Scheduled Meds:  acetaminophen (TYLENOL) oral  liquid 160 mg/5 mL  960 mg Per Tube Q8H   doxazosin  2 mg Per Tube Daily   enoxaparin (LOVENOX) injection  40 mg Subcutaneous Q24H   feeding supplement (PROSource TF)  45 mL Per Tube TID   fentaNYL  1 patch Transdermal Q72H   free water  100 mL Per Tube Q4H   mouth rinse  15 mL Mouth Rinse BID   melatonin  3 mg Per Tube QHS   midodrine  10 mg Per Tube TID WC   pantoprazole sodium  40 mg Per Tube Daily   polyethylene glycol  17 g Per Tube BID   pregabalin  100 mg Per Tube TID   sennosides  10 mL Per Tube BID   sodium chloride flush  10-40 mL Intracatheter Q12H   Continuous Infusions:  sodium chloride Stopped (05/02/21 0501)   feeding supplement (JEVITY 1.5 CAL/FIBER) 1,000 mL (05/27/21 1410)     LOS: 34 days   Time spent: 25 minutes.  Azucena Fallen, DO [facility] [email or web address] 05/28/2021, 7:17 AM

## 2021-05-29 DIAGNOSIS — K567 Ileus, unspecified: Secondary | ICD-10-CM | POA: Diagnosis not present

## 2021-05-29 DIAGNOSIS — K5903 Drug induced constipation: Secondary | ICD-10-CM | POA: Diagnosis not present

## 2021-05-29 DIAGNOSIS — J9611 Chronic respiratory failure with hypoxia: Secondary | ICD-10-CM | POA: Diagnosis not present

## 2021-05-29 DIAGNOSIS — R140 Abdominal distension (gaseous): Secondary | ICD-10-CM | POA: Diagnosis not present

## 2021-05-29 LAB — CBC
HCT: 28 % — ABNORMAL LOW (ref 39.0–52.0)
Hemoglobin: 8.9 g/dL — ABNORMAL LOW (ref 13.0–17.0)
MCH: 29.9 pg (ref 26.0–34.0)
MCHC: 31.8 g/dL (ref 30.0–36.0)
MCV: 94 fL (ref 80.0–100.0)
Platelets: 198 10*3/uL (ref 150–400)
RBC: 2.98 MIL/uL — ABNORMAL LOW (ref 4.22–5.81)
RDW: 16.7 % — ABNORMAL HIGH (ref 11.5–15.5)
WBC: 6.9 10*3/uL (ref 4.0–10.5)
nRBC: 0 % (ref 0.0–0.2)

## 2021-05-29 LAB — BASIC METABOLIC PANEL
Anion gap: 8 (ref 5–15)
BUN: 27 mg/dL — ABNORMAL HIGH (ref 8–23)
CO2: 25 mmol/L (ref 22–32)
Calcium: 8.7 mg/dL — ABNORMAL LOW (ref 8.9–10.3)
Chloride: 104 mmol/L (ref 98–111)
Creatinine, Ser: <0.3 mg/dL — ABNORMAL LOW (ref 0.61–1.24)
Glucose, Bld: 100 mg/dL — ABNORMAL HIGH (ref 70–99)
Potassium: 4.1 mmol/L (ref 3.5–5.1)
Sodium: 137 mmol/L (ref 135–145)

## 2021-05-29 NOTE — Progress Notes (Addendum)
PROGRESS NOTE  SHANNAN GARFINKEL  HFW:[id number] DOB: October 25, 1950 DOA: 04/23/2021 PCP: Larena Glassman, MD   Brief Narrative: 70 year old male with chronic trach/chronic vent at [facility] - vent side, not LTAC, history of constipation chronic irritation, prior pneumonia, critical illness myopathy functional quadriplegia, comes into the hospital with abdominal distention.  CT on admission showed colonic distention/ileus without obstruction or perforation.  GI consulted, he was placed on aggressive bowel regimen with resolution of his ileus.  GI signed off and eventually was transferred to the hospitalist service.  Patient had an event of hypotension and fever on 7/8, he was briefly on antibiotics but all cultures were negative and has been monitored off antibiotics since 7/10.  He has had no further issues, awaiting placement back.  Assessment & Plan: Active Problems:   Ileus (HCC)   Abdominal distension   Constipation   Hypokalemia   Chronic respiratory failure with hypoxia (HCC)   Colonic distention/ileus, resolved:  - Recurrent history of the same, he was admitted to the hospital and GI was consulted.   - Likely opioid induced, he had a moviprep with resolution of his symptoms.   - Continue scheduled bowel regimen. Ileus has essentially resolved.   Functional quadriplegia, chronic respiratory failure, tracheostomy and vent dependent, PEG tube in place:  - All stable chronic problems with continuation of PTA regimen. - Continue PT efforts to maximize functional ability   Hypokalemia/hypomagnesemia: Resolved.   Hypotension: No new anemia/evidence of bleeding. No new fever, leukocytosis, or nidus of infection. Volume status appears appropriate. 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  - Continue midodrine. Still hypotensive but perfusing well. - Holding lisinopril.  Hyperkalemia:  - Given lokelma, DC'ed lisinopril as above.  Resolved.    Anemia of chronic disease: No bleeding. Hgb stable.  - Check H/H periodically.    Chronic low back pain, neuropathy, mood disorder:  - Continue home lyrica, fentanyl patch, ativan. Pt was not aware of cymbalta and does not feel he requires this, will DC for now.  DVT prophylaxis: Lovenox Code Status: Full Family Communication: None at bedside Disposition Plan:  Status is: Inpatient  Remains inpatient appropriate because:Unsafe d/c plan. Stable for discharge, TOC working on placement  Dispo: The patient is from: [facility] Anticipated d/c is to: [facility] awaiting bed availability.  Patient currently is medically stable to d/c. Awaiting bed availability for safe disposition Difficult to place patient No  Consultants:  PCCM  Subjective: No acute issues or events overnight, continues to work with PT/OT  Objective: Vitals:   05/29/21 0410 05/29/21 0500 05/29/21 0600 05/29/21 0700  BP:  (!) 98/47 (!) 103/44 (!) 109/53  Pulse: (!) 55 (!) 57 (!) 58 (!) 56  Resp: 16 16 16 16   Temp:      TempSrc:      SpO2: 99% 99% 99% 99%  Weight:      Height:        Intake/Output Summary (Last 24 hours) at 05/29/2021 0720 Last data filed at 05/29/2021 0700 Gross per 24 hour  Intake 2405 ml  Output 1410 ml  Net 995 ml    Filed Weights   05/26/21 0500 05/27/21 0500 05/28/21 0500  Weight: 87.7 kg 87.9 kg 87.2 kg   Gen: 70 y.o. male in no distress Pulm: Nonlabored on pressure support, clear bilaterally. CV: Regular rate and rhythm. No murmur, rub, or gallop. No JVD, trace dependent edema. GI: Abdomen soft, slightly  less distended with normoactive bowel sounds, nontender. Tube feeds ongoing. Ext: Warm, dry, decreased muscle bulk and tone. Skin: No new rashes, lesions or ulcers on visualized skin. Neuro: Alert and oriented. Incomplete quadriplegia. No new focal neurological deficits. Psych: Judgement and insight appear fair. Mood euthymic & affect congruent.  Behavior is appropriate.    Scheduled Meds:  acetaminophen (TYLENOL) oral liquid 160 mg/5 mL  960 mg Per Tube Q8H   doxazosin  2 mg Per Tube Daily   enoxaparin (LOVENOX) injection  40 mg Subcutaneous Q24H   feeding supplement (PROSource TF)  45 mL Per Tube TID   fentaNYL  1 patch Transdermal Q72H   free water  100 mL Per Tube Q4H   mouth rinse  15 mL Mouth Rinse BID   melatonin  3 mg Per Tube QHS   midodrine  10 mg Per Tube TID WC   pantoprazole sodium  40 mg Per Tube Daily   polyethylene glycol  17 g Per Tube BID   pregabalin  100 mg Per Tube TID   sennosides  10 mL Per Tube BID   sodium chloride flush  10-40 mL Intracatheter Q12H   Continuous Infusions:  sodium chloride Stopped (05/02/21 0501)   feeding supplement (JEVITY 1.5 CAL/FIBER) 1,000 mL (05/28/21 1306)     LOS: 35 days   Time spent: 25 minutes.  Azucena Fallen, DO [facility] [email or web address] 05/29/2021, 7:20 AM

## 2021-05-29 NOTE — Plan of Care (Signed)
Patient is able to understand education and communicate needs effectively. Patient has maintained VS within normal limits and no major complications with diagnostic tests. Patient has not complained of pain or showed signs of discomfort this shift. Patient is being repositioned frequently. Patient is able to maintain airway with the tracheostomy and patient has maintained saturations even while weaning. Patient is able to effectively mouth words to communicate with staff.    Problem: Education: Goal: Knowledge of General Education information will improve Description: Including pain rating scale, medication(s)/side effects and non-pharmacologic comfort measures Outcome: Progressing   Problem: Clinical Measurements: Goal: Ability to maintain clinical measurements within normal limits will improve Outcome: Progressing Goal: Diagnostic test results will improve Outcome: Progressing   Problem: Pain Managment: Goal: General experience of comfort will improve Outcome: Progressing   Problem: Safety: Goal: Ability to remain free from injury will improve Outcome: Progressing   Problem: Skin Integrity: Goal: Risk for impaired skin integrity will decrease Outcome: Progressing   Problem: Respiratory: Goal: Patent airway maintenance will improve Outcome: Progressing   Problem: Role Relationship: Goal: Ability to communicate will improve Outcome: Progressing   Patient is quadriplegic and unable to manage health-related needs, tracheostomy or move independently.    Problem: Health Behavior/Discharge Planning: Goal: Ability to manage health-related needs will improve Outcome: Not Progressing   Problem: Activity: Goal: Risk for activity intolerance will decrease Outcome: Not Progressing   Problem: Activity: Goal: Ability to tolerate increased activity will improve Outcome: Not Progressing   Problem: Health Behavior/Discharge Planning: Goal: Ability to manage tracheostomy will  improve Outcome: Not Progressing

## 2021-05-30 DIAGNOSIS — J9611 Chronic respiratory failure with hypoxia: Secondary | ICD-10-CM | POA: Diagnosis not present

## 2021-05-30 DIAGNOSIS — Z9911 Dependence on respirator [ventilator] status: Secondary | ICD-10-CM | POA: Diagnosis not present

## 2021-05-30 DIAGNOSIS — Z930 Tracheostomy status: Secondary | ICD-10-CM | POA: Diagnosis not present

## 2021-05-30 MED ORDER — SODIUM CHLORIDE 0.9 % IV BOLUS
500.0000 mL | Freq: Once | INTRAVENOUS | Status: AC
  Administered 2021-05-30: 500 mL via INTRAVENOUS

## 2021-05-30 MED ORDER — BETHANECHOL CHLORIDE 10 MG PO TABS
10.0000 mg | ORAL_TABLET | Freq: Three times a day (TID) | ORAL | Status: DC
  Administered 2021-05-30 – 2021-06-09 (×31): 10 mg
  Filled 2021-05-30 (×33): qty 1

## 2021-05-30 NOTE — TOC Progression Note (Signed)
Transition of Care [facility]) - Progression Note    Patient Details  Name: Andrew Rivera MRN: [id number] Date of Birth: 01-16-1951  Transition of Care [facility]) CM/SW Contact  Ralene Bathe, LCSWA Phone Number: 05/30/2021, 11:49 AM  Clinical Narrative:    10:26- CSW contacted Prem with [facility] to inquire about bed availability.  CSW is awaiting a response.  11:30-  CSW was informed by Prem that the person who was in front of [name] for a bed may not be coming today.  Prem asked that CSW send updated clinicals and the facility will begin insurance auth.    11:51- Clinicals sent.     Expected Discharge Plan: Long Term Acute Care (LTAC) Barriers to Discharge: Insurance Authorization  Expected Discharge Plan and Services Expected Discharge Plan: Long Term Acute Care (LTAC)         Expected Discharge Date: 05/17/21                                     Social Determinants of Health (SDOH) Interventions    Readmission Risk Interventions No flowsheet data found.

## 2021-05-30 NOTE — Progress Notes (Signed)
   NAME:  Andrew Rivera, MRN:  [id number], DOB:  02-23-1951, LOS: 36 ADMISSION DATE:  04/23/2021, CONSULTATION DATE:  04/24/21 REFERRING MD:  ED, CHIEF COMPLAINT:  abdominal pain   History of Present Illness:  70 y/o male, former smoker, with hx of PNA and critical illness myopathy presented with abdominal distention and pain from [facility].  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   Pertinent  Medical History  Chronic pain, IJ thromboembolism, Septic shock, Pneumonia, Constipation with ileus, Critical illness myopathy, MSSA Bacteremia in November 2020, Pressure wounds  Significant Hospital Events: Including procedures, antibiotic start and stop dates in addition to other pertinent events   7/02 Admit 7/08 fever 101.7, start vancomycin/cefepime 7/12 trach collar trial did not last long 7/14 Ileus resolved, tolerating PSV weans 7/17 Routine trach change #6 cuffed Shiley 7/22 ATC briefly but failed due to dyspnea 8/4 trach upsized to distal XLT to prevent additional stomal dilation due to significant cuff leak  Interim History / Subjective:  Andrew Rivera denies complaints. He is resistant to foley removal.  Objective   Blood pressure (!) 95/35, pulse (!) 56, temperature 98.4 F (36.9 C), temperature source Axillary, resp. rate 16, height 5\' 8"  (1.727 m), weight 90.6 kg, SpO2 99 %.    Vent Mode: CPAP;PSV FiO2 (%):  [28 %] 28 % Set Rate:  [16 bmp] 16 bmp Vt Set:  [550 mL] 550 mL PEEP:  [5 cmH20] 5 cmH20 Pressure Support:  [10 cmH20] 10 cmH20 Plateau Pressure:  [16 cmH20-21 cmH20] 21 cmH20   Intake/Output Summary (Last 24 hours) at 05/30/2021 0919 Last data filed at 05/30/2021 0700 Gross per 24 hour  Intake 2145 ml  Output 1095 ml  Net 1050 ml    Filed Weights   05/27/21 0500 05/28/21 0500 05/30/21 0500  Weight: 87.9 kg 87.2 kg 90.6 kg    Examination:  General:   chronically ill appearing man  lying in bed watching TV HEENT: [place]/AT, eyes anicteric Neuro: awake, alert, answering questions appropriately with mouthing words. Globally weak with muscle wasting. CV: S1S2, RRR PULM:  CTAB, Shiley #6 distal XLT in place, no current leak around trach. Tolerating PS 10 with Vt ~400cc. Minimal white secretions from trach. GI: soft, NT, mildly distended. PEG LUQ. Extremities: no significant edema, no cyanosis Skin: warm, dry, no rashes.   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 Chronic tracheostomy and ventilator dependence. P: -Con't PS + CPAP during the day, can try to vent wean. PRVC at night. -routine trach care per protocol -VAP prevention protocol -TOC following for placement to [facility] when bed available. -Prefer to continue distal XLT chronically.   DM type 2 Anemia of critical illness and chronic disease Mood disorder with insomnia Chronic pain P: -management per TRH   ABD Pain/Distention in setting of Ileus- improved P: -per TRH / GI    We will continue to follow two times weekly. In the interim please call if questions.  07/30/21, DO 05/30/21 9:32 AM [facility]

## 2021-05-30 NOTE — Progress Notes (Signed)
PROGRESS NOTE  HARPER SMOKER  OVF:[id number] DOB: October 07, 1951 DOA: 04/23/2021 PCP: Larena Glassman, MD   Brief Narrative: 70 year old male with chronic trach/chronic vent at [facility] - vent side, not LTAC, history of constipation chronic irritation, prior pneumonia, critical illness myopathy functional quadriplegia, comes into the hospital with abdominal distention.  CT on admission showed colonic distention/ileus without obstruction or perforation. GI consulted, he was placed on aggressive bowel regimen with resolution of his ileus. GI signed off and eventually was transferred to the hospitalist service.  Patient had an event of hypotension and fever on 7/8, he was briefly on antibiotics but all cultures were negative and has been monitored off antibiotics since 7/10.  He has had no further issues, awaiting placement back.  Assessment & Plan: Active Problems:   Ileus (HCC)   Abdominal distension   Constipation   Hypokalemia   Chronic respiratory failure with hypoxia (HCC)   Colonic distention/ileus, resolved:  - Recurrent history of the same, he was admitted to the hospital and GI was consulted.   - Likely opioid induced, he had a moviprep with resolution of his symptoms.   - Continue scheduled bowel regimen. Ileus has essentially resolved.   Functional quadriplegia, chronic respiratory failure, tracheostomy and vent dependent, PEG tube in place:  - All stable chronic problems with continuation of PTA regimen. - Continue PT efforts to maximize functional ability - Foley to be removed 8/8 - follow clinically   Hypokalemia/hypomagnesemia: Resolved.   Hypotension:  - No new anemia/evidence of bleeding. No new fever, leukocytosis, or nidus of infection. Volume status appears appropriate. 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  - Continue midodrine. Still hypotensive but perfusing well. - Holding lisinopril.  Hyperkalemia:   - Given lokelma, DC'ed lisinopril as above. Resolved.    Anemia of chronic disease: No bleeding. Hgb stable.  - Check H/H periodically.    Chronic low back pain, neuropathy, mood disorder:  - Continue home lyrica, fentanyl patch, ativan. Cymbalta previously discontinued.  DVT prophylaxis: Lovenox Code Status: Full Family Communication: Wife at bedside Disposition Plan:  Status is: Inpatient  Remains inpatient appropriate because:Unsafe d/c plan. Stable for discharge, TOC working on placement  Dispo: The patient is from: [facility] Anticipated d/c is to: [facility] awaiting bed availability.  Patient currently is medically stable to d/c. Awaiting bed availability for safe disposition Difficult to place patient No  Consultants:  PCCM  Subjective: No acute issues or events overnight, continues to work with PT/OT - foley to be removed today  Objective: Vitals:   05/30/21 0400 05/30/21 0500 05/30/21 0600 05/30/21 0700  BP: (!) 99/34 (!) 103/34 (!) 100/37 (!) 95/35  Pulse: (!) 56 (!) 53 (!) 54 (!) 57  Resp: 16 16 16 16   Temp:      TempSrc:      SpO2: 98% 99% 98% 97%  Weight:  90.6 kg    Height:        Intake/Output Summary (Last 24 hours) at 05/30/2021 0719 Last data filed at 05/30/2021 0700 Gross per 24 hour  Intake 2505 ml  Output 1170 ml  Net 1335 ml    Filed Weights   05/27/21 0500 05/28/21 0500 05/30/21 0500  Weight: 87.9 kg 87.2 kg 90.6 kg   Gen: 70 y.o. male in no distress Pulm: Nonlabored on pressure support, clear bilaterally. CV: Regular rate and rhythm. No murmur, rub, or gallop. No JVD, trace dependent edema. GI:  Abdomen soft, slightly less distended with normoactive bowel sounds, nontender. Tube feeds ongoing. Ext: Warm, dry, decreased muscle bulk and tone. Skin: No new rashes, lesions or ulcers on visualized skin. Neuro: Alert and oriented. Incomplete quadriplegia. No new focal neurological deficits. Psych: Judgement and insight appear  fair. Mood euthymic & affect congruent. Behavior is appropriate.    Scheduled Meds:  acetaminophen (TYLENOL) oral liquid 160 mg/5 mL  960 mg Per Tube Q8H   doxazosin  2 mg Per Tube Daily   enoxaparin (LOVENOX) injection  40 mg Subcutaneous Q24H   feeding supplement (PROSource TF)  45 mL Per Tube TID   fentaNYL  1 patch Transdermal Q72H   free water  100 mL Per Tube Q4H   mouth rinse  15 mL Mouth Rinse BID   melatonin  3 mg Per Tube QHS   midodrine  10 mg Per Tube TID WC   pantoprazole sodium  40 mg Per Tube Daily   polyethylene glycol  17 g Per Tube BID   pregabalin  100 mg Per Tube TID   sennosides  10 mL Per Tube BID   sodium chloride flush  10-40 mL Intracatheter Q12H   Continuous Infusions:  sodium chloride Stopped (05/02/21 0501)   feeding supplement (JEVITY 1.5 CAL/FIBER) 1,000 mL (05/29/21 1035)     LOS: 36 days   Time spent: 25 minutes.  Azucena Fallen, DO [facility] Pager: Secure chat  05/30/2021, 7:19 AM

## 2021-05-30 NOTE — Progress Notes (Signed)
Assisted tele visit to patient with wife.  Marquette Piontek P, RN  

## 2021-05-31 DIAGNOSIS — J9611 Chronic respiratory failure with hypoxia: Secondary | ICD-10-CM | POA: Diagnosis not present

## 2021-05-31 DIAGNOSIS — Z930 Tracheostomy status: Secondary | ICD-10-CM | POA: Diagnosis not present

## 2021-05-31 DIAGNOSIS — Z9911 Dependence on respirator [ventilator] status: Secondary | ICD-10-CM | POA: Diagnosis not present

## 2021-05-31 NOTE — Progress Notes (Signed)
PROGRESS NOTE  Andrew Rivera  ZOX:[id number] DOB: Mar 17, 1951 DOA: 04/23/2021 PCP: Larena Glassman, MD   Brief Narrative: 70 year old male with chronic trach/chronic vent at [facility] - vent side, not LTAC, history of constipation chronic irritation, prior pneumonia, critical illness myopathy functional quadriplegia, comes into the hospital with abdominal distention.  CT on admission showed colonic distention/ileus without obstruction or perforation. GI consulted, he was placed on aggressive bowel regimen with resolution of his ileus. GI signed off and eventually was transferred to the hospitalist service.  Patient had an event of hypotension and fever on 7/8, he was briefly on antibiotics but all cultures were negative and has been monitored off antibiotics since 7/10.  He has had no further issues, awaiting placement back.  Assessment & Plan: Active Problems:   Ileus (HCC)   Abdominal distension   Constipation   Hypokalemia   Chronic respiratory failure with hypoxia (HCC)   Colonic distention/ileus, resolved:  - Recurrent history of the same, he was admitted to the hospital and GI was consulted.   - Likely opioid induced, he had a moviprep with resolution of his symptoms.   - Continue scheduled bowel regimen. Ileus has essentially resolved.   Functional quadriplegia, chronic respiratory failure, tracheostomy and vent dependent, PEG tube in place:  - All stable chronic problems with continuation of PTA regimen. - Continue PT efforts to maximize functional ability - Foley to be removed 8/8 - follow clinically - UOP appropriate - no signs/symptoms of obstruction   Hypokalemia/hypomagnesemia: Resolved.   Hypotension:  - No new anemia/evidence of bleeding. No new fever, leukocytosis, or nidus of infection. Volume status appears appropriate. 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  - Continue midodrine. Still hypotensive but  perfusing well. - Holding lisinopril.  Hyperkalemia, resolved:  - Given lokelma previously, DC'ed lisinopril as above.    Anemia of chronic disease: No bleeding. Hgb stable.  - Check H/H periodically - no need for chronic surveillance   Chronic low back pain, neuropathy, mood disorder:  - Continue home lyrica, fentanyl patch, ativan. Cymbalta previously discontinued.  DVT prophylaxis: Lovenox Code Status: Full Family Communication: Wife at bedside Disposition Plan:  Status is: Inpatient  Remains inpatient appropriate because:Unsafe d/c plan. Stable for discharge, TOC working on placement  Dispo: The patient is from: [facility] Anticipated d/c is to: [facility] awaiting bed availability.  Patient currently is medically stable to d/c. Awaiting bed availability for safe disposition Difficult to place patient No  Consultants:  PCCM  Subjective: No acute issues or events overnight, continues to work with PT/OT - hopeful for DC soon  Objective: Vitals:   05/31/21 0346 05/31/21 0400 05/31/21 0500 05/31/21 0600  BP: (!) 90/32 (!) 87/31 (!) 85/32 (!) 86/32  Pulse: (!) 51 (!) 50 (!) 49 (!) 53  Resp: 16 16 16 16   Temp:      TempSrc:      SpO2: 100% 99% 98% 99%  Weight:   88.9 kg   Height:        Intake/Output Summary (Last 24 hours) at 05/31/2021 0715 Last data filed at 05/31/2021 0600 Gross per 24 hour  Intake 2255.23 ml  Output 720 ml  Net 1535.23 ml    Filed Weights   05/28/21 0500 05/30/21 0500 05/31/21 0500  Weight: 87.2 kg 90.6 kg 88.9 kg   Gen: 70 y.o. male in no distress Pulm: Nonlabored on pressure support, clear bilaterally. CV: Regular rate  and rhythm. No murmur, rub, or gallop. No JVD, trace dependent edema. GI: Abdomen soft, slightly less distended with normoactive bowel sounds, nontender. Tube feeds ongoing. Ext: Warm, dry, decreased muscle bulk and tone. Skin: No new rashes, lesions or ulcers on visualized skin. Neuro: Alert and oriented.  Incomplete quadriplegia. No new focal neurological deficits. Psych: Judgement and insight appear fair. Mood euthymic & affect congruent. Behavior is appropriate.    Scheduled Meds:  acetaminophen (TYLENOL) oral liquid 160 mg/5 mL  960 mg Per Tube Q8H   bethanechol  10 mg Per Tube TID   enoxaparin (LOVENOX) injection  40 mg Subcutaneous Q24H   feeding supplement (PROSource TF)  45 mL Per Tube TID   fentaNYL  1 patch Transdermal Q72H   free water  100 mL Per Tube Q4H   mouth rinse  15 mL Mouth Rinse BID   melatonin  3 mg Per Tube QHS   midodrine  10 mg Per Tube TID WC   pantoprazole sodium  40 mg Per Tube Daily   polyethylene glycol  17 g Per Tube BID   pregabalin  100 mg Per Tube TID   sennosides  10 mL Per Tube BID   sodium chloride flush  10-40 mL Intracatheter Q12H   Continuous Infusions:  sodium chloride Stopped (05/02/21 0501)   feeding supplement (JEVITY 1.5 CAL/FIBER) 1,000 mL (05/29/21 1035)     LOS: 37 days   Time spent: 25 minutes.  Azucena Fallen, DO [facility]  Pager: Secure chat  05/31/2021, 7:15 AM

## 2021-05-31 NOTE — Progress Notes (Signed)
Physical Therapy Treatment Patient Details Name: Andrew Rivera MRN: [id number] DOB: 1950-11-05 Today's Date: 05/31/2021    History of Present Illness 70 year old male  comes into the hospital 04/23/21 with abdominal distention. GI consulted, he was placed on aggressive bowel regimen with resolution of his ileus. Episode of hypotension and fever 7/8. Trach exchange 8/4  PMH chronic trach/chronic vent at [facility] - vent side, not LTAC, history of constipation chronic irritation, prior pneumonia, critical illness myopathy functional quadriplegia    PT Comments    Goal to work towards chair position in bed, however drop in BP continues to limit progress. Exercises x 4 extremities completed with slow increases in HOB from 30 to 35 and then 40 degrees. SCDs in place during session.   HOB 30 BP 103/37 (56) HOB 35 BP 110/33 (57) HOB 40 BP 98/35 (50) HOB 35 104/35 (54) HOB 35 after 2 minutes 106/37 (56)     Follow Up Recommendations  Other (comment) (trial of acute PT to see if pt can make progress with upright tolerance)     Equipment Recommendations  None recommended by PT    Recommendations for Other Services       Precautions / Restrictions Precautions Precautions: Other (comment) Precaution Comments: chronic trach    Mobility  Bed Mobility Overal bed mobility: Needs Assistance             General bed mobility comments: at rest HOB 30; elevated HOB 35 with no drop in BP; elevated to 40 with drop and returned to 35 wiht BP stabilizing    Transfers                    Ambulation/Gait                 Stairs             Wheelchair Mobility    Modified Rankin (Stroke Patients Only)       Balance                                            Cognition Arousal/Alertness: Awake/alert Behavior During Therapy: WFL for tasks assessed/performed Overall Cognitive Status: Within Functional Limits for tasks assessed                                  General Comments: cognition not specifically tested; lipspeaks and appropriate with questions asked      Exercises General Exercises - Upper Extremity Shoulder Flexion: AAROM;Strengthening;Both;10 reps (assisted flexion, resisted shoulder extension (rt stronger)) Elbow Flexion: AAROM;Both;10 reps Elbow Extension: AAROM;Both;10 reps General Exercises - Lower Extremity Ankle Circles/Pumps: PROM;Both;5 reps (followed by prolonged stretch) Heel Slides: AAROM;Strengthening;10 reps (assisted flexion; resisted extension (rt stronger)) Hip ABduction/ADduction: AAROM;Both;10 reps    General Comments        Pertinent Vitals/Pain Pain Assessment: Faces Faces Pain Scale: No hurt    Home Living                      Prior Function            PT Goals (current goals can now be found in the care plan section) Acute Rehab PT Goals Patient Stated Goal: to return to [facility] PT Goal Formulation: With patient Time For Goal Achievement: 06/11/21 Potential to Achieve  Goals: Poor Progress towards PT goals: Not progressing toward goals - comment (BP continues to drop with minor changes in position)    Frequency    Min 2X/week      PT Plan Current plan remains appropriate    Co-evaluation              AM-PAC PT "6 Clicks" Mobility   Outcome Measure  Help needed turning from your back to your side while in a flat bed without using bedrails?: Total Help needed moving from lying on your back to sitting on the side of a flat bed without using bedrails?: Total Help needed moving to and from a bed to a chair (including a wheelchair)?: Total Help needed standing up from a chair using your arms (e.g., wheelchair or bedside chair)?: Total Help needed to walk in hospital room?: Total Help needed climbing 3-5 steps with a railing? : Total 6 Click Score: 6    End of Session Equipment Utilized During Treatment: Oxygen;Other (comment) (vent) Activity  Tolerance: Treatment limited secondary to medical complications (Comment) (limited by decr BP with attempts at chair position) Patient left: in bed;with call bell/phone within reach (pt's callbell modified by RN and pt able to press with index finger)   PT Visit Diagnosis: Pain Pain - Right/Left: Right Pain - part of body: Hip;Knee     Time: 5027-7412 PT Time Calculation (min) (ACUTE ONLY): 34 min  Charges:  $Therapeutic Exercise: 23-37 mins                      [name], PT Pager [phone]    [name] 05/31/2021, 9:49 AM

## 2021-06-01 DIAGNOSIS — Z9911 Dependence on respirator [ventilator] status: Secondary | ICD-10-CM | POA: Diagnosis not present

## 2021-06-01 DIAGNOSIS — J9611 Chronic respiratory failure with hypoxia: Secondary | ICD-10-CM | POA: Diagnosis not present

## 2021-06-01 DIAGNOSIS — Z930 Tracheostomy status: Secondary | ICD-10-CM | POA: Diagnosis not present

## 2021-06-01 NOTE — Progress Notes (Signed)
PROGRESS NOTE  Andrew Rivera  XBJ:[id number] DOB: 1951-07-21 DOA: 04/23/2021 PCP: Larena Glassman, MD   Brief Narrative: 70 year old male with chronic trach/chronic vent at [facility] - vent side, not LTAC, history of constipation chronic irritation, prior pneumonia, critical illness myopathy functional quadriplegia, comes into the hospital with abdominal distention.  CT on admission showed colonic distention/ileus without obstruction or perforation. GI consulted, he was placed on aggressive bowel regimen with resolution of his ileus. GI signed off and eventually was transferred to the hospitalist service.  Patient had an event of hypotension and fever on 7/8, he was briefly on antibiotics but all cultures were negative and has been monitored off antibiotics since 7/10.  He has had no further issues, awaiting placement back.  Assessment & Plan: Active Problems:   Ileus (HCC)   Abdominal distension   Constipation   Hypokalemia   Chronic respiratory failure with hypoxia (HCC)   Colonic distention/ileus, resolved:  - Recurrent history of the same, he was admitted to the hospital and GI was consulted.   - Likely opioid induced, he had a moviprep with resolution of his symptoms.   - Continue scheduled bowel regimen. Ileus has essentially resolved.   Functional quadriplegia, chronic respiratory failure, tracheostomy and vent dependent, PEG tube in place:  - All stable chronic problems with continuation of PTA regimen. - Continue PT efforts to maximize functional ability - Foley to be removed 8/8 - follow clinically - UOP appropriate - no signs/symptoms of obstruction   Hypokalemia/hypomagnesemia: Resolved.   Hypotension:  - No new anemia/evidence of bleeding. No new fever, leukocytosis, or nidus of infection. Volume status appears appropriate. 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  - Continue midodrine. Still hypotensive but  perfusing well. - Holding lisinopril.  Hyperkalemia, resolved:  - Given lokelma previously, DC'ed lisinopril as above.    Anemia of chronic disease: No bleeding. Hgb stable.  - Check H/H periodically - no need for chronic surveillance   Chronic low back pain, neuropathy, mood disorder:  - Continue home lyrica, fentanyl patch, ativan. Cymbalta previously discontinued.  DVT prophylaxis: Lovenox Code Status: Full Family Communication: Wife at bedside Disposition Plan:  Status is: Inpatient  Remains inpatient appropriate because:Unsafe d/c plan. Stable for discharge, TOC working on placement  Dispo: The patient is from: [facility] Anticipated d/c is to: [facility] awaiting bed availability.  Patient currently is medically stable to d/c. Awaiting bed availability for safe disposition Difficult to place patient No  Consultants:  PCCM  Subjective: No acute issues or events overnight, continues to work with PT/OT - hopeful for DC soon  Objective: Vitals:   06/01/21 1100 06/01/21 1118 06/01/21 1119 06/01/21 1128  BP: (!) 115/38 (!) 115/38    Pulse: (!) 54 (!) 55    Resp: 18 19    Temp:    98.6 F (37 C)  TempSrc:    Oral  SpO2: 99% 100% 99%   Weight:      Height:        Intake/Output Summary (Last 24 hours) at 06/01/2021 1138 Last data filed at 06/01/2021 1100 Gross per 24 hour  Intake 2100 ml  Output 800 ml  Net 1300 ml    Filed Weights   05/28/21 0500 05/30/21 0500 05/31/21 0500  Weight: 87.2 kg 90.6 kg 88.9 kg   Gen: 70 y.o. male in no distress Pulm: Nonlabored on pressure support, clear bilaterally. CV: Regular rate and rhythm.  No murmur, rub, or gallop. No JVD, trace dependent edema. GI: Abdomen soft, slightly less distended with normoactive bowel sounds, nontender. Tube feeds ongoing. Ext: Warm, dry, decreased muscle bulk and tone. Skin: No new rashes, lesions or ulcers on visualized skin. Neuro: Alert and oriented. Incomplete quadriplegia. No new  focal neurological deficits. Psych: Judgement and insight appear fair. Mood euthymic & affect congruent. Behavior is appropriate.    Scheduled Meds:  acetaminophen (TYLENOL) oral liquid 160 mg/5 mL  960 mg Per Tube Q8H   bethanechol  10 mg Per Tube TID   enoxaparin (LOVENOX) injection  40 mg Subcutaneous Q24H   feeding supplement (PROSource TF)  45 mL Per Tube TID   fentaNYL  1 patch Transdermal Q72H   free water  100 mL Per Tube Q4H   mouth rinse  15 mL Mouth Rinse BID   melatonin  3 mg Per Tube QHS   midodrine  10 mg Per Tube TID WC   pantoprazole sodium  40 mg Per Tube Daily   polyethylene glycol  17 g Per Tube BID   pregabalin  100 mg Per Tube TID   sennosides  10 mL Per Tube BID   sodium chloride flush  10-40 mL Intracatheter Q12H   Continuous Infusions:  sodium chloride Stopped (05/02/21 0501)   feeding supplement (JEVITY 1.5 CAL/FIBER) 1,000 mL (06/01/21 0622)     LOS: 38 days   Time spent: 25 minutes.  Azucena Fallen, DO [facility]  Pager: Secure chat  06/01/2021, 11:38 AM

## 2021-06-01 NOTE — Progress Notes (Signed)
Video connection link was texted/emailed to participant via mobile number/e-mail provided. Connection was successful.  

## 2021-06-01 NOTE — Progress Notes (Signed)
Nutrition Follow-up  DOCUMENTATION CODES:   Not applicable  INTERVENTION:   Continue TF via PEG: Jevity 1.5 at 55 ml/h (1320 ml/d) Prosource TF 45 ml TID  Provides 2100 kcal, 117 gm protein, 1003 ml free water daily  Continue free water flushes 100 ml every 4 hours for total of 1603 ml per day. Recommend adjust free water flushes as needed based on sodium levels.   NUTRITION DIAGNOSIS:   Inadequate oral intake related to inability to eat as evidenced by NPO status.  Ongoing   GOAL:   Patient will meet greater than or equal to 90% of their needs  Met with TF  MONITOR:   I & O's, TF tolerance  REASON FOR ASSESSMENT:   Ventilator    ASSESSMENT:   Pt admitted from [facility] with increasing abdominal distention and abdominal pain due to constipation and ileus. Pt with PMH of chronic pain, septic shock, chronic respiratory failure, recent ED visit for constipation, hx spinal surgeries, MSSA bacteriemia and pressure injury.  Discussed patient in ICU rounds and with RN today. Patient is medically stable for discharge; awaiting bed availability at [facility]. Patient is tolerating TF at goal rate. Currently receiving Jevity 1.5 at 55 ml/h with Prosource TF 45 ml TID via PEG. Free water flushes 100 ml every 4 hours.  Patient remains intubated on ventilator support via trach MV: 9 L/min Temp (24hrs), Avg:98.8 F (37.1 C), Min:98.4 F (36.9 C), Max:99.3 F (37.4 C)   Labs reviewed.  Medications reviewed and include Protonix, Miralax, Senokot.  Weight 88.9 kg today Admission weight 90.9 kg on 7/3.   Diet Order:   Diet Order             Diet NPO time specified  Diet effective now                   EDUCATION NEEDS:   Not appropriate for education at this time  Skin:  Skin Assessment: Reviewed RN Assessment  Last BM:  8/10 type 7  Height:   Ht Readings from Last 1 Encounters:  05/29/21 5' 8"  (1.727 m)    Weight:   Wt Readings from Last 1  Encounters:  05/31/21 88.9 kg    BMI:  Body mass index is 29.8 kg/m.  Estimated Nutritional Needs:   Kcal:  1900-2100  Protein:  100-125 grams  Fluid:  >1.9 L/day    Lucas Mallow, RD, LDN, CNSC Please refer to Amion for contact information.

## 2021-06-01 NOTE — TOC Progression Note (Addendum)
Transition of Care [facility]) - Progression Note    Patient Details  Name: Andrew Rivera MRN: [id number] Date of Birth: March 28, 1951  Transition of Care [facility]) CM/SW Contact  Ralene Bathe, LCSWA Phone Number: 06/01/2021, 8:32 AM  Clinical Narrative:    08:32-  CSW contacted Prem with [facility] to inquire about the status of insurance authorization and is awaiting a response.    10:21- CSW received a response from [facility].  Insurance authorization is still pending.    11:16-  CSW received a message from [facility] that insurance is requiring more information.  Information sent.    Pending- insurance approval for vent SNF  Expected Discharge Plan: Long Term Acute Care (LTAC) Barriers to Discharge: Insurance Authorization  Expected Discharge Plan and Services Expected Discharge Plan: Long Term Acute Care (LTAC)         Expected Discharge Date: 05/17/21                                     Social Determinants of Health (SDOH) Interventions    Readmission Risk Interventions No flowsheet data found.

## 2021-06-02 DIAGNOSIS — Z9911 Dependence on respirator [ventilator] status: Secondary | ICD-10-CM | POA: Diagnosis not present

## 2021-06-02 DIAGNOSIS — Z930 Tracheostomy status: Secondary | ICD-10-CM | POA: Diagnosis not present

## 2021-06-02 DIAGNOSIS — J9611 Chronic respiratory failure with hypoxia: Secondary | ICD-10-CM | POA: Diagnosis not present

## 2021-06-02 LAB — CBC WITH DIFFERENTIAL/PLATELET
Abs Immature Granulocytes: 0.03 10*3/uL (ref 0.00–0.07)
Basophils Absolute: 0 10*3/uL (ref 0.0–0.1)
Basophils Relative: 0 %
Eosinophils Absolute: 0.3 10*3/uL (ref 0.0–0.5)
Eosinophils Relative: 4 %
HCT: 27 % — ABNORMAL LOW (ref 39.0–52.0)
Hemoglobin: 8.7 g/dL — ABNORMAL LOW (ref 13.0–17.0)
Immature Granulocytes: 0 %
Lymphocytes Relative: 18 %
Lymphs Abs: 1.4 10*3/uL (ref 0.7–4.0)
MCH: 30.4 pg (ref 26.0–34.0)
MCHC: 32.2 g/dL (ref 30.0–36.0)
MCV: 94.4 fL (ref 80.0–100.0)
Monocytes Absolute: 0.7 10*3/uL (ref 0.1–1.0)
Monocytes Relative: 9 %
Neutro Abs: 5.5 10*3/uL (ref 1.7–7.7)
Neutrophils Relative %: 69 %
Platelets: 195 10*3/uL (ref 150–400)
RBC: 2.86 MIL/uL — ABNORMAL LOW (ref 4.22–5.81)
RDW: 17.1 % — ABNORMAL HIGH (ref 11.5–15.5)
WBC: 8 10*3/uL (ref 4.0–10.5)
nRBC: 0 % (ref 0.0–0.2)

## 2021-06-02 LAB — COMPREHENSIVE METABOLIC PANEL
ALT: 13 U/L (ref 0–44)
AST: 12 U/L — ABNORMAL LOW (ref 15–41)
Albumin: 2.6 g/dL — ABNORMAL LOW (ref 3.5–5.0)
Alkaline Phosphatase: 80 U/L (ref 38–126)
Anion gap: 7 (ref 5–15)
BUN: 29 mg/dL — ABNORMAL HIGH (ref 8–23)
CO2: 26 mmol/L (ref 22–32)
Calcium: 8.8 mg/dL — ABNORMAL LOW (ref 8.9–10.3)
Chloride: 106 mmol/L (ref 98–111)
Creatinine, Ser: <0.3 mg/dL — ABNORMAL LOW (ref 0.61–1.24)
Glucose, Bld: 123 mg/dL — ABNORMAL HIGH (ref 70–99)
Potassium: 4.2 mmol/L (ref 3.5–5.1)
Sodium: 139 mmol/L (ref 135–145)
Total Bilirubin: 0.4 mg/dL (ref 0.3–1.2)
Total Protein: 6.1 g/dL — ABNORMAL LOW (ref 6.5–8.1)

## 2021-06-02 LAB — MAGNESIUM: Magnesium: 2.2 mg/dL (ref 1.7–2.4)

## 2021-06-02 MED ORDER — FUROSEMIDE 10 MG/ML IJ SOLN
40.0000 mg | Freq: Once | INTRAMUSCULAR | Status: AC
  Administered 2021-06-02: 40 mg via INTRAVENOUS
  Filled 2021-06-02: qty 4

## 2021-06-02 NOTE — Progress Notes (Signed)
NAME:  Andrew Rivera, MRN:  [id number], DOB:  10-02-1951, LOS: 39 ADMISSION DATE:  04/23/2021, CONSULTATION DATE:  04/24/21 REFERRING MD:  ED, CHIEF COMPLAINT:  abdominal pain   History of Present Illness:  70 y/o male, former smoker, with hx of PNA and critical illness myopathy presented with abdominal distention and pain from [facility].  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   Pertinent  Medical History  Chronic pain, IJ thromboembolism, Septic shock, Pneumonia, Constipation with ileus, Critical illness myopathy, MSSA Bacteremia in November 2020, Pressure wounds  Significant Hospital Events: Including procedures, antibiotic start and stop dates in addition to other pertinent events   7/02 Admit 7/08 fever 101.7, start vancomycin/cefepime 7/12 trach collar trial did not last long 7/14 Ileus resolved, tolerating PSV weans 7/17 Routine trach change #6 cuffed Shiley 7/22 ATC briefly but failed due to dyspnea 8/4 trach upsized to distal XLT to prevent additional stomal dilation due to significant cuff leak  Interim History / Subjective:  No acute events  On ventilator/trach  Subjective: Denies SOB, Chest pain  Objective   Blood pressure (!) 91/40, pulse (!) 59, temperature 98.7 F (37.1 C), temperature source Oral, resp. rate 16, height 5\' 8"  (1.727 m), weight 88.9 kg, SpO2 97 %.    Vent Mode: PSV;CPAP FiO2 (%):  [28 %] 28 % Set Rate:  [16 bmp] 16 bmp Vt Set:  [550 mL] 550 mL PEEP:  [5 cmH20] 5 cmH20 Pressure Support:  [10 cmH20] 10 cmH20 Plateau Pressure:  [15 cmH20-16 cmH20] 16 cmH20   Intake/Output Summary (Last 24 hours) at 06/02/2021 08/02/2021 Last data filed at 06/02/2021 0700 Gross per 24 hour  Intake 1965 ml  Output 725 ml  Net 1240 ml    Filed Weights   05/28/21 0500 05/30/21 0500 05/31/21 0500  Weight: 87.2 kg 90.6 kg 88.9 kg    Examination: General:  in bed, NAD, appears  comfortable HEENT: MM pink/moist, anicteric, atraumatic, 6.0 shiley XLT-D, clear secretions around stoma Neuro: GCS 11t, RASS 0, PERRL 32mm CV: S1S2, SB, no m/r/g appreciated PULM:  clear in the upper lobes and in the lower lobes, Trachea midline, chest expansion symmetric GI: soft, bsx4 active, LLQ peg in place Extremities: warm/dry, no pretibial edema, capillary refill less than 3 seconds  Skin: no rashes or lesions   Resolved Hospital Problem list   Colonic Ileus Hypokalemia  Assessment & Plan:   Chronic hypoxic respiratory failure in setting of critical illness myopathy, along with 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 Chronic tracheostomy and ventilator dependence. Tolerated 10 hours on PS+CPAP on 8/10. No cuff leak appreciated on exam. Scant secretions. P: -Continue PS+CPAP during the day with PRVC at night. Difficulty weaning further. -Continue trach care per protocol -Continue VAP prevention interventions. -Continue mobilization with PT. -Appreciate TOC assistance with placement in [facility] when bed available. -Continue 6.0 distal XLT chronically.  DM type 2 Anemia of critical illness and chronic disease Mood disorder with insomnia Chronic pain P: -management per TRH.  ABD Pain/Distention in setting of Ileus- resolved Per documentation X3 BM on 8/10, X2 BM 09/09 P: -management per TRH/GI.   We will continue to follow two times weekly. In the interim please call if questions.  11/09., MSN, APRN, AGACNP-BC [facility]  06/02/2021 , 8:29 AM  Please see [email or web address] for pager details  If no response, please call [phone] After hours, please  call Elink at [phone]

## 2021-06-02 NOTE — Progress Notes (Signed)
PROGRESS NOTE  CAMILA NORVILLE  XLK:[id number] DOB: [birth year] DOA: 04/23/2021 PCP: Larena Glassman, MD   Brief Narrative: 70 year old male with chronic trach/chronic vent at [facility] - vent side, not LTAC, history of constipation chronic irritation, prior pneumonia, critical illness myopathy functional quadriplegia, comes into the hospital with abdominal distention.  CT on admission showed colonic distention/ileus without obstruction or perforation. GI consulted, he was placed on aggressive bowel regimen with resolution of his ileus. GI signed off and eventually was transferred to the hospitalist service.  Patient had an event of hypotension and fever on 7/8, he was briefly on antibiotics but all cultures were negative and has been monitored off antibiotics since 7/10.  He has had no further issues, awaiting placement back.  Assessment & Plan: Active Problems:   Ileus (HCC)   Abdominal distension   Constipation   Hypokalemia   Chronic respiratory failure with hypoxia (HCC)   Colonic distention/ileus, resolved:  - Recurrent history of the same, he was admitted to the hospital and GI was consulted.   - Likely opioid induced, he had a moviprep with resolution of his symptoms.   - Continue scheduled bowel regimen. Ileus has essentially resolved.   Functional quadriplegia, chronic respiratory failure, tracheostomy and vent dependent, PEG tube in place:  - All stable chronic problems with continuation of PTA regimen. - Continue PT efforts to maximize functional ability - Foley to be removed 8/8 - follow clinically - UOP appropriate - no signs/symptoms of obstruction   Hypokalemia/hypomagnesemia: Resolved.   Hypotension:  - No new anemia/evidence of bleeding. No new fever, leukocytosis, or nidus of infection. Volume status appears appropriate. 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  - Continue midodrine. Still hypotensive but  perfusing well. - Holding lisinopril.  Hyperkalemia, resolved:  - Given lokelma previously, DC'ed lisinopril as above.    Anemia of chronic disease: No bleeding. Hgb stable.  - Check H/H periodically - no need for chronic surveillance   Chronic low back pain, neuropathy, mood disorder:  - Continue home lyrica, fentanyl patch, ativan. Cymbalta previously discontinued.  Right toe ingrown nail -Rule out infectious process, wound care to follow -no indication for systemic antibiotics, topical wound care likely to be sufficient  DVT prophylaxis: Lovenox Code Status: Full Family Communication: Wife updated at bedside previously Disposition Plan:  Status is: Inpatient  Remains inpatient appropriate because:Unsafe d/c plan. Stable for discharge, TOC working on placement  Dispo: The patient is from: [facility] Anticipated d/c is to: [facility] awaiting bed availability.  Patient currently is medically stable to d/c. Awaiting bed availability for safe disposition Difficult to place patient No  Consultants:  PCCM  Subjective: Noted right great toe/toenail wound otherwise no acute issues or events overnight  Objective: Vitals:   06/02/21 0400 06/02/21 0500 06/02/21 0600 06/02/21 0700  BP: (!) 89/42 (!) 94/42 (!) 92/44 (!) 91/40  Pulse: (!) 52 (!) 52 (!) 53 (!) 55  Resp: 16 16 16 16   Temp:      TempSrc:      SpO2: 97% 98% 98% 97%  Weight:      Height:        Intake/Output Summary (Last 24 hours) at 06/02/2021 0743 Last data filed at 06/02/2021 0700 Gross per 24 hour  Intake 2285 ml  Output 725 ml  Net 1560 ml    Filed Weights   05/28/21 0500 05/30/21 0500 05/31/21 0500  Weight: 87.2 kg 90.6  kg 88.9 kg   Gen: 70 y.o. male in no distress Pulm: Nonlabored on pressure support, clear bilaterally. CV: Regular rate and rhythm. No murmur, rub, or gallop. No JVD, trace dependent edema. GI: Abdomen soft, slightly less distended with normoactive bowel sounds,  nontender. Tube feeds ongoing. Ext: Warm, dry, decreased muscle bulk and tone. Skin: No new rashes, lesions or ulcers on visualized skin. Neuro: Alert and oriented. Incomplete quadriplegia. No new focal neurological deficits. Psych: Judgement and insight appear fair. Mood euthymic & affect congruent. Behavior is appropriate.    Scheduled Meds:  acetaminophen (TYLENOL) oral liquid 160 mg/5 mL  960 mg Per Tube Q8H   bethanechol  10 mg Per Tube TID   enoxaparin (LOVENOX) injection  40 mg Subcutaneous Q24H   feeding supplement (PROSource TF)  45 mL Per Tube TID   fentaNYL  1 patch Transdermal Q72H   free water  100 mL Per Tube Q4H   mouth rinse  15 mL Mouth Rinse BID   melatonin  3 mg Per Tube QHS   midodrine  10 mg Per Tube TID WC   pantoprazole sodium  40 mg Per Tube Daily   polyethylene glycol  17 g Per Tube BID   pregabalin  100 mg Per Tube TID   sennosides  10 mL Per Tube BID   sodium chloride flush  10-40 mL Intracatheter Q12H   Continuous Infusions:  sodium chloride Stopped (05/02/21 0501)   feeding supplement (JEVITY 1.5 CAL/FIBER) 1,000 mL (06/02/21 0620)     LOS: 39 days   Time spent: 25 minutes.  Azucena Fallen, DO [facility]  Pager: Secure chat  06/02/2021, 7:43 AM

## 2021-06-02 NOTE — Progress Notes (Signed)
Physical Therapy Treatment Patient Details Name: Andrew Rivera MRN: [id number] DOB: 17-May-1951 Today's Date: 06/02/2021    History of Present Illness 70 year old male  comes into the hospital 04/23/21 with abdominal distention. GI consulted, he was placed on aggressive bowel regimen with resolution of his ileus. Episode of hypotension and fever 7/8. Trach exchange 8/4  PMH chronic trach/chronic vent at [facility] - vent side, not LTAC, history of constipation chronic irritation, prior pneumonia, critical illness myopathy functional quadriplegia    PT Comments    Patient seen for ROM exercises with goal to maintain MAP>60 and allow increased HOB/chair position for ultimate goal of OOB to chair. Patient continues with low starting BP/MAP  107/36 (56) with increase to MAP 62 with exercise. With increasing HOB 30 to 35 degrees, MAP decreased to 55. Proceeded with exercises and recovered to MAP 59 and progressed to HOB 45 degrees with MAP 56 at end of session.   Messaged MD to discuss goals for MAP as pt consistently runs low and ? If we can use lower goal while progressing his activity/mobility.     Follow Up Recommendations  Other (comment) (trial of acute PT to see if pt can make progress with upright tolerance)     Equipment Recommendations  None recommended by PT    Recommendations for Other Services       Precautions / Restrictions Precautions Precautions: Other (comment) Precaution Comments: chronic trach    Mobility  Bed Mobility Overal bed mobility: Needs Assistance             General bed mobility comments: at rest HOB 30; elevated HOB 35 with no drop in BP; elevated to 45 with drop and waited 2 minutes returned with BP stabilizing and remained at 45 at end of session    Transfers                    Ambulation/Gait                 Stairs             Wheelchair Mobility    Modified Rankin (Stroke Patients Only)       Balance                                             Cognition Arousal/Alertness: Awake/alert Behavior During Therapy: WFL for tasks assessed/performed Overall Cognitive Status: Within Functional Limits for tasks assessed                                 General Comments: cognition not specifically tested; lipspeaks and appropriate with questions asked      Exercises General Exercises - Upper Extremity Shoulder Flexion: AAROM;Strengthening;Both;10 reps (assisted flexion, resisted shoulder extension (rt stronger)) Elbow Flexion: AAROM;Both;10 reps Elbow Extension: AAROM;Both;10 reps General Exercises - Lower Extremity Ankle Circles/Pumps: PROM;Both;5 reps (followed by prolonged stretch) Heel Slides: AAROM;Strengthening;10 reps (assisted flexion; resisted extension (rt stronger)) Hip ABduction/ADduction: AAROM;Both;10 reps Other Exercises Other Exercises: hip internal rotation/external rotation x 10 reps each    General Comments        Pertinent Vitals/Pain Pain Assessment: Faces Faces Pain Scale: No hurt    Home Living Family/patient expects to be discharged to:: Skilled nursing facility  Additional Comments: [facility]    Prior Function Level of Independence: Needs assistance  Gait / Transfers Assistance Needed: pt was not receiving PT at [facility]; nursing was lifting pt OOB to geri-chair 2x per week. The chair is fitted with a "purple mattress" for positioning and comfort. 8/6 Wife reports pt was sitting at EOb and even working on standing prior to discharge from therapy "months ago" (neither she nor patient could specify how long ago) ADL's / Homemaking Assistance Needed: OT was seeing pt for "contracture management" for his neck and shoulders. Patient does not self feed as he no longer eats by mouth. total assist for all ADLs Comments: 8/2 Called [facility] and spoke with Andrew Rivera, OT for above information. 8/6 spoke with Wife, Andrew Rivera   PT  Goals (current goals can now be found in the care plan section) Acute Rehab PT Goals Patient Stated Goal: to return to [facility] Time For Goal Achievement: 06/11/21 Potential to Achieve Goals: Poor Progress towards PT goals: Not progressing toward goals - comment (BP continues to limit progress)    Frequency    Min 2X/week      PT Plan Current plan remains appropriate    Co-evaluation              AM-PAC PT "6 Clicks" Mobility   Outcome Measure  Help needed turning from your back to your side while in a flat bed without using bedrails?: Total Help needed moving from lying on your back to sitting on the side of a flat bed without using bedrails?: Total Help needed moving to and from a bed to a chair (including a wheelchair)?: Total Help needed standing up from a chair using your arms (e.g., wheelchair or bedside chair)?: Total Help needed to walk in hospital room?: Total Help needed climbing 3-5 steps with a railing? : Total 6 Click Score: 6    End of Session Equipment Utilized During Treatment: Oxygen;Other (comment) (vent) Activity Tolerance: Treatment limited secondary to medical complications (Comment) (limited by decr BP with attempts at chair position) Patient left: in bed;with call bell/phone within reach (pt's callbell modified by RN and pt able to press with index finger) Nurse Communication: Other (comment) (nursing reports they have been tring to put up towards chair position with same drops in bP) PT Visit Diagnosis: Pain Pain - Right/Left: Right Pain - part of body: Hip;Knee     Time: 1431-1515 PT Time Calculation (min) (ACUTE ONLY): 44 min  Charges:  $Therapeutic Exercise: 38-52 mins                      [name], PT Pager [phone]    [name] 06/02/2021, 3:21 PM

## 2021-06-02 NOTE — Consult Note (Signed)
WOC Nurse Consult Note: Reason for Consult:Patient consult for RGT medial aspect with ingrown toenail. Fluid accumulation noted at base, able to express a small amount of serosanguinous exudate, mild erythema. Tenderness to touch, patient acknowledges history of ingrown toenails in the past. Wound type: trauma Pressure Injury POA: N/A Measurement:1.5cm long area of involvement along medial aspect of right great toenail Wound bed:N/A Drainage (amount, consistency, odor) dried exudate (serum) at nail Periwound: N/A Dressing procedure/placement/frequency: I will ask Nursing to wash with soap and warm water, ten pain with betadine swabstick twice daily.  It is noted that lateral aspect of toenail is also curved and may present a problem at a date not too much in the future.  Recommend consultation with Podiatric Medicine. If you agree, please order/arrange.  WOC nursing team will not follow, but will remain available to this patient, the nursing and medical teams.  Please re-consult if needed. Thanks, Ladona Mow, MSN, RN, GNP, [facility] [place]  Pager# [phone]

## 2021-06-03 LAB — BASIC METABOLIC PANEL
Anion gap: 9 (ref 5–15)
BUN: 35 mg/dL — ABNORMAL HIGH (ref 8–23)
CO2: 27 mmol/L (ref 22–32)
Calcium: 8.9 mg/dL (ref 8.9–10.3)
Chloride: 102 mmol/L (ref 98–111)
Creatinine, Ser: <0.3 mg/dL — ABNORMAL LOW (ref 0.61–1.24)
Glucose, Bld: 119 mg/dL — ABNORMAL HIGH (ref 70–99)
Potassium: 4 mmol/L (ref 3.5–5.1)
Sodium: 138 mmol/L (ref 135–145)

## 2021-06-03 MED ORDER — HYDROCORTISONE 1 % EX CREA
TOPICAL_CREAM | Freq: Two times a day (BID) | CUTANEOUS | Status: DC | PRN
  Administered 2021-06-04 – 2021-06-05 (×2): 1 via TOPICAL
  Filled 2021-06-03 (×2): qty 28

## 2021-06-03 NOTE — Progress Notes (Signed)
RT note: Pt placed on PS/CPAP 10/5. Pt is tolerating well. Rt will continue to monitor.

## 2021-06-03 NOTE — TOC Progression Note (Signed)
Transition of Care [facility]) - Progression Note    Patient Details  Name: Andrew Rivera MRN: [id number] Date of Birth: 09-Jan-1951  Transition of Care [facility]) CM/SW Contact  Ralene Bathe, LCSWA Phone Number: 06/03/2021, 10:05 AM  Clinical Narrative:    CSW received a message from Prem with [facility] that [employer] denied SNF for the patient.  CSW attempted to call Sherley Bounds with Utilization review at [employer] [phone] to inquire about the reason for the denial.  There was no answer.  CSW left a VM requesting a returned call.   Expected Discharge Plan: Long Term Acute Care (LTAC) Barriers to Discharge: Insurance Authorization  Expected Discharge Plan and Services Expected Discharge Plan: Long Term Acute Care (LTAC)         Expected Discharge Date: 05/17/21                                     Social Determinants of Health (SDOH) Interventions    Readmission Risk Interventions No flowsheet data found.

## 2021-06-03 NOTE — Progress Notes (Signed)
PROGRESS NOTE  Andrew Rivera  JYN:[id number] DOB: Jun 27, 1951 DOA: 04/23/2021 PCP: Larena Glassman, MD   Brief Narrative: 70 year old male with chronic trach/chronic vent at [facility] - vent side, not LTAC, history of constipation chronic irritation, prior pneumonia, critical illness myopathy functional quadriplegia, comes into the hospital with abdominal distention.  CT on admission showed colonic distention/ileus without obstruction or perforation. GI consulted, he was placed on aggressive bowel regimen with resolution of his ileus. GI signed off and eventually was transferred to the hospitalist service.  Patient had an event of hypotension and fever on 7/8, he was briefly on antibiotics but all cultures were negative and has been monitored off antibiotics since 7/10.  He has had no further issues, awaiting placement back.  Assessment & Plan: Active Problems:   Ileus (HCC)   Abdominal distension   Constipation   Hypokalemia   Chronic respiratory failure with hypoxia (HCC)  Colonic distention/ileus, resolved:  - Recurrent history of the same, he was admitted to the hospital and GI was consulted.   - Likely opioid induced, he had a moviprep with resolution of his symptoms.   - Continue scheduled bowel regimen. Ileus has essentially resolved.   Functional quadriplegia, chronic respiratory failure, tracheostomy and vent dependent, PEG tube in place:  - All stable chronic problems with continuation of PTA regimen. - Continue PT efforts to maximize functional ability - Foley to be removed 8/8 - follow clinically - UOP appropriate - no signs/symptoms of obstruction   Hypokalemia/hypomagnesemia: Resolved.   Hypotension:  - No new anemia/evidence of bleeding. No new fever, leukocytosis, or nidus of infection. Volume status appears appropriate. 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  - Continue midodrine. Still hypotensive but  perfusing well. - Holding lisinopril.  Hyperkalemia, resolved:  - Given lokelma previously, DC'ed lisinopril as above.    Anemia of chronic disease: No bleeding. Hgb stable.  - Check H/H periodically - no need for chronic surveillance   Chronic low back pain, neuropathy, mood disorder:  - Continue home lyrica, fentanyl patch, ativan. Cymbalta previously discontinued.  Right toe ingrown nail -Does not appear to be infectious, continue cleaning per wound care: Wash with soap and warm water, ten pain with betadine swabstick twice daily.  DVT prophylaxis: Lovenox Code Status: Full Family Communication: Wife updated at bedside previously Disposition Plan:  Status is: Inpatient  Remains inpatient appropriate because: Unsafe d/c plan. Stable for discharge, TOC working on placement  Dispo: The patient is from: [facility] Anticipated d/c is to: [facility] awaiting bed availability.  Patient currently is medically stable to d/c. Awaiting bed availability for safe disposition Difficult to place patient No  Consultants:  PCCM  Subjective: No acute issues or events overnight denies nausea vomiting diarrhea constipation headache fevers chills or chest pain  Objective: Vitals:   06/03/21 0338 06/03/21 0400 06/03/21 0500 06/03/21 0600  BP:  (!) 98/33 (!) 97/35 (!) 102/36  Pulse: (!) 47 (!) 49 (!) 52 (!) 55  Resp: 15 16 16 16   Temp: 97.8 F (36.6 C)     TempSrc: Axillary     SpO2: 98% 98% 98% 97%  Weight:      Height:        Intake/Output Summary (Last 24 hours) at 06/03/2021 0737 Last data filed at 06/03/2021 0600 Gross per 24 hour  Intake 2765 ml  Output 1550 ml  Net 1215 ml    08/03/2021  05/28/21 0500 05/30/21 0500 05/31/21 0500  Weight: 87.2 kg 90.6 kg 88.9 kg   Gen: 70 y.o. male in no distress Pulm: Nonlabored on pressure support, clear bilaterally. CV: Regular rate and rhythm. No murmur, rub, or gallop. No JVD, trace dependent edema. GI: Abdomen  soft, slightly less distended with normoactive bowel sounds, nontender. Tube feeds ongoing. Ext: Warm, dry, decreased muscle bulk and tone. Skin: No new rashes, lesions or ulcers on visualized skin.  Right ingrown toenail scant erythema laterally-without purulence or exudate Neuro: Alert and oriented. Incomplete quadriplegia. No new focal neurological deficits. Psych: Judgement and insight appear fair. Mood euthymic & affect congruent. Behavior is appropriate.    Scheduled Meds:  acetaminophen (TYLENOL) oral liquid 160 mg/5 mL  960 mg Per Tube Q8H   bethanechol  10 mg Per Tube TID   enoxaparin (LOVENOX) injection  40 mg Subcutaneous Q24H   feeding supplement (PROSource TF)  45 mL Per Tube TID   fentaNYL  1 patch Transdermal Q72H   free water  100 mL Per Tube Q4H   mouth rinse  15 mL Mouth Rinse BID   melatonin  3 mg Per Tube QHS   midodrine  10 mg Per Tube TID WC   pantoprazole sodium  40 mg Per Tube Daily   polyethylene glycol  17 g Per Tube BID   pregabalin  100 mg Per Tube TID   sennosides  10 mL Per Tube BID   sodium chloride flush  10-40 mL Intracatheter Q12H   Continuous Infusions:  sodium chloride Stopped (05/02/21 0501)   feeding supplement (JEVITY 1.5 CAL/FIBER) 1,000 mL (06/03/21 0600)     LOS: 40 days   Time spent: 25 minutes.  Azucena Fallen, DO [facility]  Pager: Secure chat  06/03/2021, 7:37 AM

## 2021-06-04 LAB — CREATININE, SERUM: Creatinine, Ser: <0.3 mg/dL — ABNORMAL LOW (ref 0.61–1.24)

## 2021-06-04 NOTE — Progress Notes (Signed)
PROGRESS NOTE  Andrew Rivera  IRC:[id number] DOB: [birth year] DOA: 04/23/2021 PCP: Larena Glassman, MD   Brief Narrative: 70 year old male with chronic trach/chronic vent at [facility] - vent side, not LTAC, history of constipation chronic irritation, prior pneumonia, critical illness myopathy functional quadriplegia, comes into the hospital with abdominal distention.  CT on admission showed colonic distention/ileus without obstruction or perforation. GI consulted, he was placed on aggressive bowel regimen with resolution of his ileus. GI signed off and eventually was transferred to the hospitalist service.  Patient had an event of hypotension and fever on 7/8, he was briefly on antibiotics but all cultures were negative and has been monitored off antibiotics since 7/10.  He has had no further issues, awaiting placement back.  Assessment & Plan: Active Problems:   Ileus (HCC)   Abdominal distension   Constipation   Hypokalemia   Chronic respiratory failure with hypoxia (HCC)  Colonic distention/ileus, resolved:  - Recurrent history of the same, he was admitted to the hospital and GI was consulted.   - Likely opioid induced, he had a moviprep with resolution of his symptoms.   - Continue scheduled bowel regimen. Ileus has essentially resolved.   Functional quadriplegia, chronic respiratory failure, tracheostomy and vent dependent, PEG tube in place:  - All stable chronic problems with continuation of PTA regimen. - Continue PT efforts to maximize functional ability - Foley to be removed 8/8 - follow clinically - UOP appropriate - no signs/symptoms of obstruction   Hypokalemia/hypomagnesemia: Resolved.   Hypotension:  - No new anemia/evidence of bleeding. No new fever, leukocytosis, or nidus of infection. Volume status appears appropriate. 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  - Continue midodrine. Still hypotensive but  perfusing well. - Holding lisinopril.  Hyperkalemia, resolved:  - Given lokelma previously, DC'ed lisinopril as above.    Anemia of chronic disease: No bleeding. Hgb stable.  - Check H/H periodically - no need for chronic surveillance   Chronic low back pain, neuropathy, mood disorder:  - Continue home lyrica, fentanyl patch, ativan. Cymbalta previously discontinued.  Right toe ingrown nail -Does not appear to be infectious, continue cleaning per wound care: -Wash with soap and warm water, ten pain with betadine swabstick twice daily.  DVT prophylaxis: Lovenox Code Status: Full Family Communication: Wife updated at bedside previously Disposition Plan:  Status is: Inpatient  Remains inpatient appropriate because: Unsafe d/c plan. Stable for discharge, TOC working on placement  Dispo: The patient is from: [facility] Anticipated d/c is to: [facility] awaiting bed availability.  Patient currently is medically stable to d/c. Awaiting bed availability for safe disposition Difficult to place patient No  Consultants:  PCCM  Subjective: No acute issues or events overnight denies nausea vomiting diarrhea constipation headache fevers chills or chest pain  Objective: Vitals:   06/04/21 0339 06/04/21 0400 06/04/21 0500 06/04/21 0600  BP:  (!) 93/35 (!) 94/37 (!) 101/34  Pulse: (!) 50 (!) 51 (!) 52 (!) 55  Resp: 16 14 16 16   Temp:      TempSrc:      SpO2: 99% 98% 98% 98%  Weight:      Height:        Intake/Output Summary (Last 24 hours) at 06/04/2021 0727 Last data filed at 06/04/2021 0600 Gross per 24 hour  Intake 1730 ml  Output 785 ml  Net 945 ml    Filed Weights   05/28/21 0500 05/30/21  0500 05/31/21 0500  Weight: 87.2 kg 90.6 kg 88.9 kg   Gen: 70 y.o. male in no distress Pulm: Nonlabored on pressure support, clear bilaterally. CV: Regular rate and rhythm. No murmur, rub, or gallop. No JVD, trace dependent edema. GI: Abdomen soft, slightly less distended  with normoactive bowel sounds, nontender. Tube feeds ongoing. Ext: Warm, dry, decreased muscle bulk and tone. Skin: No new rashes, lesions or ulcers on visualized skin.  Right ingrown toenail scant erythema laterally-without purulence or exudate Neuro: Alert and oriented. Incomplete quadriplegia. No new focal neurological deficits. Psych: Judgement and insight appear fair. Mood euthymic & affect congruent. Behavior is appropriate.    Scheduled Meds:  acetaminophen (TYLENOL) oral liquid 160 mg/5 mL  960 mg Per Tube Q8H   bethanechol  10 mg Per Tube TID   enoxaparin (LOVENOX) injection  40 mg Subcutaneous Q24H   feeding supplement (PROSource TF)  45 mL Per Tube TID   fentaNYL  1 patch Transdermal Q72H   free water  100 mL Per Tube Q4H   mouth rinse  15 mL Mouth Rinse BID   melatonin  3 mg Per Tube QHS   midodrine  10 mg Per Tube TID WC   pantoprazole sodium  40 mg Per Tube Daily   polyethylene glycol  17 g Per Tube BID   pregabalin  100 mg Per Tube TID   sennosides  10 mL Per Tube BID   sodium chloride flush  10-40 mL Intracatheter Q12H   Continuous Infusions:  sodium chloride Stopped (05/02/21 0501)   feeding supplement (JEVITY 1.5 CAL/FIBER) 1,000 mL (06/04/21 0500)     LOS: 41 days   Time spent: 25 minutes.  Azucena Fallen, DO [facility]  Pager: Secure chat  06/04/2021, 7:27 AM

## 2021-06-04 NOTE — Progress Notes (Signed)
RT note-Patient tolerating wean well at this time, continue to monitor.

## 2021-06-04 NOTE — Sepsis Progress Note (Signed)
Assisted tele visit to patient with wife.  Talen Poser R, RN  

## 2021-06-04 NOTE — Progress Notes (Signed)
Assisted tele visit to patient with family member.  Karinna Beadles R, RN  

## 2021-06-05 LAB — CBC WITH DIFFERENTIAL/PLATELET
Abs Immature Granulocytes: 0.02 10*3/uL (ref 0.00–0.07)
Basophils Absolute: 0 10*3/uL (ref 0.0–0.1)
Basophils Relative: 0 %
Eosinophils Absolute: 0.4 10*3/uL (ref 0.0–0.5)
Eosinophils Relative: 5 %
HCT: 27.3 % — ABNORMAL LOW (ref 39.0–52.0)
Hemoglobin: 8.5 g/dL — ABNORMAL LOW (ref 13.0–17.0)
Immature Granulocytes: 0 %
Lymphocytes Relative: 20 %
Lymphs Abs: 1.4 10*3/uL (ref 0.7–4.0)
MCH: 29.8 pg (ref 26.0–34.0)
MCHC: 31.1 g/dL (ref 30.0–36.0)
MCV: 95.8 fL (ref 80.0–100.0)
Monocytes Absolute: 0.7 10*3/uL (ref 0.1–1.0)
Monocytes Relative: 10 %
Neutro Abs: 4.4 10*3/uL (ref 1.7–7.7)
Neutrophils Relative %: 65 %
Platelets: 212 10*3/uL (ref 150–400)
RBC: 2.85 MIL/uL — ABNORMAL LOW (ref 4.22–5.81)
RDW: 16.8 % — ABNORMAL HIGH (ref 11.5–15.5)
WBC: 7 10*3/uL (ref 4.0–10.5)
nRBC: 0 % (ref 0.0–0.2)

## 2021-06-05 LAB — COMPREHENSIVE METABOLIC PANEL
ALT: 14 U/L (ref 0–44)
AST: 13 U/L — ABNORMAL LOW (ref 15–41)
Albumin: 2.7 g/dL — ABNORMAL LOW (ref 3.5–5.0)
Alkaline Phosphatase: 80 U/L (ref 38–126)
Anion gap: 7 (ref 5–15)
BUN: 37 mg/dL — ABNORMAL HIGH (ref 8–23)
CO2: 25 mmol/L (ref 22–32)
Calcium: 8.8 mg/dL — ABNORMAL LOW (ref 8.9–10.3)
Chloride: 107 mmol/L (ref 98–111)
Creatinine, Ser: <0.3 mg/dL — ABNORMAL LOW (ref 0.61–1.24)
Glucose, Bld: 112 mg/dL — ABNORMAL HIGH (ref 70–99)
Potassium: 4.2 mmol/L (ref 3.5–5.1)
Sodium: 139 mmol/L (ref 135–145)
Total Bilirubin: 0.3 mg/dL (ref 0.3–1.2)
Total Protein: 6.2 g/dL — ABNORMAL LOW (ref 6.5–8.1)

## 2021-06-05 LAB — MAGNESIUM: Magnesium: 2.4 mg/dL (ref 1.7–2.4)

## 2021-06-05 NOTE — Procedures (Signed)
Tracheostomy Change Note  Patient Details:   Name: Andrew Rivera DOB: 1950-11-03 MRN: [id number]    Airway Documentation:     Evaluation  O2 sats: stable throughout Complications: No apparent complications Patient did tolerate procedure well. Bilateral Breath Sounds: Rhonchi, Diminished End tidal positive Remains on wean    Newt Lukes 06/05/2021, 2:22 PM

## 2021-06-05 NOTE — Progress Notes (Signed)
Assisted tele visit to patient with family member.  Verdene Creson R, RN  

## 2021-06-05 NOTE — Progress Notes (Signed)
Assisted tele visit to patient with family member.  Meighan Treto R, RN  

## 2021-06-05 NOTE — Progress Notes (Signed)
PROGRESS NOTE  Andrew Rivera  ALP:[id number] DOB: [birth year] DOA: 04/23/2021 PCP: Larena Glassman, MD   Brief Narrative: 70 year old male with chronic trach/chronic vent at [facility] - vent side, not LTAC, history of constipation chronic irritation, prior pneumonia, critical illness myopathy functional quadriplegia, comes into the hospital with abdominal distention.  CT on admission showed colonic distention/ileus without obstruction or perforation. GI consulted, he was placed on aggressive bowel regimen with resolution of his ileus. GI signed off and eventually was transferred to the hospitalist service.  Patient had an event of hypotension and fever on 7/8, he was briefly on antibiotics but all cultures were negative and has been monitored off antibiotics since 7/10.  He has had no further issues, awaiting placement back.  Assessment & Plan: Active Problems:   Ileus (HCC)   Abdominal distension   Constipation   Hypokalemia   Chronic respiratory failure with hypoxia (HCC)  Colonic distention/ileus, resolved:  - Recurrent history of the same, he was admitted to the hospital and GI was consulted.   - Likely opioid induced, he had a moviprep with resolution of his symptoms.   - Continue scheduled bowel regimen. Ileus has essentially resolved.   Functional quadriplegia, chronic respiratory failure, tracheostomy and vent dependent, PEG tube in place:  - All stable chronic problems with continuation of PTA regimen. - Continue PT efforts to maximize functional ability - Foley to be removed 8/8 - follow clinically - UOP appropriate - no signs/symptoms of obstruction   Hypokalemia/hypomagnesemia: Resolved.   Hypotension:  - No new anemia/evidence of bleeding. No new fever, leukocytosis, or nidus of infection. Volume status appears appropriate. 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  - Continue midodrine. Still hypotensive but  perfusing well. - Holding lisinopril.  Hyperkalemia, resolved:  - Given lokelma previously, DC'ed lisinopril as above.    Anemia of chronic disease: No bleeding. Hgb stable.  - Check H/H periodically - no need for chronic surveillance   Chronic low back pain, neuropathy, mood disorder:  - Continue home lyrica, fentanyl patch, ativan. Cymbalta previously discontinued.  Right toe ingrown nail -Does not appear to be infectious, continue cleaning per wound care: -Wash with soap and warm water, ten pain with betadine swabstick twice daily.  DVT prophylaxis: Lovenox Code Status: Full Family Communication: Wife updated at bedside previously Disposition Plan:  Status is: Inpatient  Remains inpatient appropriate because: Unsafe d/c plan. Stable for discharge, TOC working on placement  Dispo: The patient is from: [facility] Anticipated d/c is to: [facility] awaiting bed availability.  Patient currently is medically stable to d/c. Awaiting bed availability for safe disposition Difficult to place patient No  Consultants:  PCCM  Subjective: No acute issues or events overnight, currently on speaker phone with his wife going over their daily game plan for visitation, otherwise review of systems negative  Objective: Vitals:   06/05/21 0359 06/05/21 0400 06/05/21 0500 06/05/21 0600  BP: (!) 87/35  (!) 88/35 (!) 87/33  Pulse: (!) 49 (!) 49 (!) 49 (!) 49  Resp: 16 16 16 14   Temp:      TempSrc:      SpO2: 99% 99% 97% 97%  Weight:      Height:        Intake/Output Summary (Last 24 hours) at 06/05/2021 0719 Last data filed at 06/05/2021 0600 Gross per 24 hour  Intake 3065 ml  Output 700 ml  Net 2365 ml  Filed Weights   05/28/21 0500 05/30/21 0500 05/31/21 0500  Weight: 87.2 kg 90.6 kg 88.9 kg   Gen: 70 y.o. male in no distress Pulm: Nonlabored on pressure support, clear bilaterally. CV: Regular rate and rhythm. No murmur, rub, or gallop. No JVD, trace dependent  edema. GI: Abdomen soft, slightly less distended with normoactive bowel sounds, nontender. Tube feeds ongoing. Ext: Warm, dry, decreased muscle bulk and tone. Skin: No new rashes, lesions or ulcers on visualized skin.  Right ingrown toenail scant erythema laterally-without purulence or exudate Neuro: Alert and oriented. Incomplete quadriplegia. No new focal neurological deficits. Psych: Judgement and insight appear fair. Mood euthymic & affect congruent. Behavior is appropriate.    Scheduled Meds:  acetaminophen (TYLENOL) oral liquid 160 mg/5 mL  960 mg Per Tube Q8H   bethanechol  10 mg Per Tube TID   enoxaparin (LOVENOX) injection  40 mg Subcutaneous Q24H   feeding supplement (PROSource TF)  45 mL Per Tube TID   fentaNYL  1 patch Transdermal Q72H   free water  100 mL Per Tube Q4H   mouth rinse  15 mL Mouth Rinse BID   melatonin  3 mg Per Tube QHS   midodrine  10 mg Per Tube TID WC   pantoprazole sodium  40 mg Per Tube Daily   polyethylene glycol  17 g Per Tube BID   pregabalin  100 mg Per Tube TID   sennosides  10 mL Per Tube BID   sodium chloride flush  10-40 mL Intracatheter Q12H   Continuous Infusions:  sodium chloride Stopped (05/02/21 0501)   feeding supplement (JEVITY 1.5 CAL/FIBER) 1,000 mL (06/05/21 0544)     LOS: 42 days   Time spent: 25 minutes.  Azucena Fallen, DO [facility]  Pager: Secure chat  06/05/2021, 7:19 AM

## 2021-06-06 DIAGNOSIS — J9601 Acute respiratory failure with hypoxia: Secondary | ICD-10-CM | POA: Diagnosis not present

## 2021-06-06 NOTE — Progress Notes (Signed)
PROGRESS NOTE  Andrew Rivera  QVZ:[id number] DOB: 30-Mar-1951 DOA: 04/23/2021 PCP: Larena Glassman, MD   Brief Narrative: 70 year old male with chronic trach/chronic vent at [facility] - vent side, not LTAC, history of constipation chronic irritation, prior pneumonia, critical illness myopathy functional quadriplegia, comes into the hospital with abdominal distention.  CT on admission showed colonic distention/ileus without obstruction or perforation. GI consulted, he was placed on aggressive bowel regimen with resolution of his ileus. GI signed off and eventually was transferred to the hospitalist service.  Patient had an event of hypotension and fever on 7/8, he was briefly on antibiotics but all cultures were negative and has been monitored off antibiotics since 7/10.  He has had no further issues, awaiting placement back.  Assessment & Plan: Active Problems:   Ileus (HCC)   Abdominal distension   Constipation   Hypokalemia   Chronic respiratory failure with hypoxia (HCC)  Colonic distention/ileus, resolved:  - Recurrent history of the same, he was admitted to the hospital and GI was consulted.   - Likely opioid induced, he had a moviprep with resolution of his symptoms.   - Continue scheduled bowel regimen. Ileus has essentially resolved.   Functional quadriplegia, chronic respiratory failure, tracheostomy and vent dependent, PEG tube in place:  - All stable chronic problems with continuation of PTA regimen. - Continue PT efforts to maximize functional ability - Foley to be removed 8/8 - follow clinically - UOP appropriate - no signs/symptoms of obstruction   Hypokalemia/hypomagnesemia: Resolved.   Hypotension:  - No new anemia/evidence of bleeding. No new fever, leukocytosis, or nidus of infection. Volume status appears appropriate. 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  - Continue midodrine. Still hypotensive but  perfusing well. - Holding lisinopril.  Hyperkalemia, resolved:  - Given lokelma previously, DC'ed lisinopril as above.    Anemia of chronic disease: No bleeding. Hgb stable.  - Check H/H periodically - no need for chronic surveillance   Chronic low back pain, neuropathy, mood disorder:  - Continue home lyrica, fentanyl patch, ativan. Cymbalta previously discontinued.  Right toe ingrown nail -Does not appear to be infectious, continue cleaning per wound care: -Wash with soap and warm water, ten pain with betadine swabstick twice daily.  DVT prophylaxis: Lovenox Code Status: Full Family Communication: Wife updated at bedside previously Disposition Plan:  Status is: Inpatient  Remains inpatient appropriate because: Unsafe d/c plan. Stable for discharge, TOC working on placement  Dispo: The patient is from: [facility] Anticipated d/c is to: [facility] awaiting bed availability.  Patient currently is medically stable to d/c. Awaiting bed availability for safe disposition Difficult to place patient No  Consultants:  PCCM  Subjective: No acute issues or events overnight, currently on speaker phone with his wife going over their daily game plan for visitation, otherwise review of systems negative  Objective: Vitals:   06/06/21 0400 06/06/21 0411 06/06/21 0500 06/06/21 0600  BP: (!) 95/32 (!) 95/32 (!) 96/33 (!) 115/41  Pulse: (!) 46  (!) 44 (!) 48  Resp: 16 16 16 16   Temp: 98.1 F (36.7 C)     TempSrc: Oral     SpO2: 99% 99% 100% 99%  Weight:      Height:        Intake/Output Summary (Last 24 hours) at 06/06/2021 0731 Last data filed at 06/06/2021 0600 Gross per 24 hour  Intake 1565 ml  Output 300 ml  Net  1265 ml    Filed Weights   05/28/21 0500 05/30/21 0500 05/31/21 0500  Weight: 87.2 kg 90.6 kg 88.9 kg   Gen: 70 y.o. male in no distress Pulm: Nonlabored on pressure support, clear bilaterally. CV: Regular rate and rhythm. No murmur, rub, or gallop.  No JVD, trace dependent edema. GI: Abdomen soft, slightly less distended with normoactive bowel sounds, nontender. Tube feeds ongoing. Ext: Warm, dry, decreased muscle bulk and tone. Skin: No new rashes, lesions or ulcers on visualized skin.  Right ingrown toenail scant erythema laterally-without purulence or exudate Neuro: Alert and oriented. Incomplete quadriplegia. No new focal neurological deficits. Psych: Judgement and insight appear fair. Mood euthymic & affect congruent. Behavior is appropriate.    Scheduled Meds:  acetaminophen (TYLENOL) oral liquid 160 mg/5 mL  960 mg Per Tube Q8H   bethanechol  10 mg Per Tube TID   enoxaparin (LOVENOX) injection  40 mg Subcutaneous Q24H   feeding supplement (PROSource TF)  45 mL Per Tube TID   fentaNYL  1 patch Transdermal Q72H   free water  100 mL Per Tube Q4H   mouth rinse  15 mL Mouth Rinse BID   melatonin  3 mg Per Tube QHS   midodrine  10 mg Per Tube TID WC   pantoprazole sodium  40 mg Per Tube Daily   polyethylene glycol  17 g Per Tube BID   pregabalin  100 mg Per Tube TID   sennosides  10 mL Per Tube BID   sodium chloride flush  10-40 mL Intracatheter Q12H   Continuous Infusions:  sodium chloride Stopped (05/02/21 0501)   feeding supplement (JEVITY 1.5 CAL/FIBER) 1,000 mL (06/06/21 0420)     LOS: 43 days   Time spent: 25 minutes.  Azucena Fallen, DO [facility]  Pager: Secure chat  06/06/2021, 7:31 AM

## 2021-06-06 NOTE — Progress Notes (Signed)
   NAME:  Andrew Rivera, MRN:  [id number], DOB:  [birth year], LOS: 43 ADMISSION DATE:  04/23/2021, CONSULTATION DATE:  04/24/21 REFERRING MD:  ED, CHIEF COMPLAINT:  abdominal pain   History of Present Illness:  70 y/o male, former smoker, with hx of PNA and critical illness myopathy presented with abdominal distention and pain from [facility].  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   Pertinent  Medical History  Chronic pain, IJ thromboembolism, Septic shock, Pneumonia, Constipation with ileus, Critical illness myopathy, MSSA Bacteremia in November 2020, Pressure wounds  Significant Hospital Events: Including procedures, antibiotic start and stop dates in addition to other pertinent events   7/02 Admit 7/08 fever 101.7, start vancomycin/cefepime 7/12 trach collar trial did not last long 7/14 Ileus resolved, tolerating PSV weans 7/17 Routine trach change #6 cuffed Shiley 7/22 ATC briefly but failed due to dyspnea 8/4 trach upsized to distal XLT to prevent additional stomal dilation due to significant cuff leak  Interim History / Subjective:  NAE   On minimal vent settings, PSV   Objective   Blood pressure (!) 115/41, pulse (!) 48, temperature 98.1 F (36.7 C), temperature source Oral, resp. rate 16, height 5\' 8"  (1.727 m), weight 88.9 kg, SpO2 99 %.    Vent Mode: PRVC FiO2 (%):  [28 %-30 %] 28 % Set Rate:  [16 bmp] 16 bmp Vt Set:  [550 mL] 550 mL PEEP:  [5 cmH20] 5 cmH20 Pressure Support:  [10 cmH20] 10 cmH20 Plateau Pressure:  [16 cmH20-18 cmH20] 16 cmH20   Intake/Output Summary (Last 24 hours) at 06/06/2021 0729 Last data filed at 06/06/2021 0600 Gross per 24 hour  Intake 1565 ml  Output 300 ml  Net 1265 ml   Filed Weights   05/28/21 0500 05/30/21 0500 05/31/21 0500  Weight: 87.2 kg 90.6 kg 88.9 kg    Examination: General:  Chronically ill debilitated appearing older adult M HEENT: [place].  Trach secure. Anicteric sclera  Neuro: Awake alert following commands mouthing words  CV: sinus brady. S1s2 cap refill <3sec  PULM:  LUL with a slight wheeze. Symmetrical chest expansion no accessory use on PSV  GI: soft round +bowel sounds  Extremities: Contracted digits both hands. No pitting edema.  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

## 2021-06-06 NOTE — TOC Progression Note (Signed)
Transition of Care [facility]) - Progression Note    Patient Details  Name: Andrew Rivera MRN: [id number] Date of Birth: [birth year]  Transition of [facility]) CM/SW Contact  Erin Sons, [place] Phone Number: 06/06/2021, 12:09 PM  Clinical Narrative:     CSW called [employer] at [phone] to inquire about Auth denial appeals process. CSW is informed that the member can call or CSW can initiate appeal at that moment. CSW provided all information needed to initiate expedited appeal. Initial SNF auth ref# was [id number]. CSW is provided with Appeal Ref#: [id number]. CSW to fax clinicals within 24 hours to [employer] at [phone]. Monia Pouch will make a determination within 72 hours.   CSW faxed clinicals at1209pm.   Expected Discharge Plan: Long Term Acute Care (LTAC) Barriers to Discharge: Insurance Authorization  Expected Discharge Plan and Services Expected Discharge Plan: Long Term Acute Care (LTAC)         Expected Discharge Date: 05/17/21                                     Social Determinants of Health (SDOH) Interventions    Readmission Risk Interventions No flowsheet data found.

## 2021-06-07 DIAGNOSIS — K5901 Slow transit constipation: Secondary | ICD-10-CM

## 2021-06-07 DIAGNOSIS — Z930 Tracheostomy status: Secondary | ICD-10-CM

## 2021-06-07 DIAGNOSIS — Z9911 Dependence on respirator [ventilator] status: Secondary | ICD-10-CM

## 2021-06-07 MED ORDER — NUTRISOURCE FIBER PO PACK
1.0000 | PACK | Freq: Two times a day (BID) | ORAL | Status: DC
  Administered 2021-06-07 – 2021-06-09 (×5): 1
  Filled 2021-06-07 (×7): qty 1

## 2021-06-07 NOTE — Progress Notes (Signed)
Physical Therapy Treatment Patient Details Name: Andrew Rivera MRN: [id number] DOB: 09/18/1951 Today's Date: 06/07/2021    History of Present Illness 70 year old male  comes into the hospital 04/23/21 with abdominal distention. GI consulted, he was placed on aggressive bowel regimen with resolution of his ileus. Episode of hypotension and fever 7/8. Trach exchange 8/4  PMH chronic trach/chronic vent at [facility] - vent side, not LTAC, history of constipation chronic irritation, prior pneumonia, critical illness myopathy functional quadriplegia    PT Comments    Patient seen for strengthening and working towards upright positioning with ultimate goal to transfer OOB to recliner. After exercises initiated, pt's bed noted to be soaked and nursing notified and in to assist with cleaning pt and changing linens to allow progression to upright sitting in chair position. Patient's BP did very well today and pt was without dizziness. Pt and RN educated to try to stay upright in chair position for a minimum of one hour and if he can tolerate this, then will try to lift him OOB to chair on next visit (noted wife has delivered his "purple mattress" cushion to use when up in chair).   BPs: HOB 30   119/42 (63) HR 65 HOB 30 after exercise 117/47 (65)  HR 65 HOB 30 after rolling  122/45 (66)  HR 64 HOB 45, feet lowered  142/54 (72)  HR 63 HOB 55, feet lowered  143/48 (71) HR 64     Follow Up Recommendations  Other (comment) (trial of acute PT to see if pt can make progress with upright tolerance)     Equipment Recommendations  None recommended by PT    Recommendations for Other Services       Precautions / Restrictions Precautions Precautions: Other (comment) Precaution Comments: chronic trach    Mobility  Bed Mobility Overal bed mobility: Needs Assistance Bed Mobility: Rolling;Supine to Sit Rolling: Total assist;+2 for physical assistance   Supine to sit: Total assist (via chair position  in bed)     General bed mobility comments: see above for BP tolerance; pt asymptomatic throughout session    Transfers                    Ambulation/Gait                 Stairs             Wheelchair Mobility    Modified Rankin (Stroke Patients Only)       Balance                                            Cognition Arousal/Alertness: Awake/alert Behavior During Therapy: WFL for tasks assessed/performed Overall Cognitive Status: Within Functional Limits for tasks assessed                                 General Comments: cognition not specifically tested; lipspeaks and appropriate with questions asked      Exercises General Exercises - Upper Extremity Elbow Flexion: AAROM;Both;5 reps Elbow Extension: AAROM;Both;5 reps General Exercises - Lower Extremity Ankle Circles/Pumps: PROM;Both;5 reps (followed by prolonged stretch) Heel Slides: AAROM;Strengthening;5 reps (assisted flexion; resisted extension (rt stronger))    General Comments        Pertinent Vitals/Pain Pain Assessment: Faces Faces Pain Scale: Hurts even  more Pain Location: RUE with rolling rt Pain Descriptors / Indicators: Discomfort;Grimacing Pain Intervention(s): Repositioned    Home Living Family/patient expects to be discharged to:: Skilled nursing facility               Additional Comments: [facility] vent SNF    Prior Function Level of Independence: Needs assistance  Gait / Transfers Assistance Needed: pt was not receiving PT at [facility]; nursing was lifting pt OOB to geri-chair 2x per week. The chair is fitted with a "purple mattress" for positioning and comfort. 8/6 Wife reports pt was sitting at EOb and even working on standing prior to discharge from therapy "months ago" (neither she nor patient could specify how long ago) ADL's / Homemaking Assistance Needed: OT was seeing pt for "contracture management" for his neck and shoulders.  Patient does not self feed as he no longer eats by mouth. total assist for all ADLs Comments: 8/2 Called [facility] and spoke with Tresa Endo, OT for above information. 8/6 spoke with Wife, Andrew Rivera   PT Goals (current goals can now be found in the care plan section) Acute Rehab PT Goals Patient Stated Goal: to return to [facility] PT Goal Formulation: With patient Time For Goal Achievement: 06/11/21 Potential to Achieve Goals: Poor Progress towards PT goals: Progressing toward goals    Frequency    Min 2X/week      PT Plan Current plan remains appropriate    Co-evaluation              AM-PAC PT "6 Clicks" Mobility   Outcome Measure  Help needed turning from your back to your side while in a flat bed without using bedrails?: Total Help needed moving from lying on your back to sitting on the side of a flat bed without using bedrails?: Total Help needed moving to and from a bed to a chair (including a wheelchair)?: Total Help needed standing up from a chair using your arms (e.g., wheelchair or bedside chair)?: Total Help needed to walk in hospital room?: Total Help needed climbing 3-5 steps with a railing? : Total 6 Click Score: 6    End of Session Equipment Utilized During Treatment: Oxygen;Other (comment) (vent) Activity Tolerance: Patient tolerated treatment well Patient left: in bed;with call bell/phone within reach (pt's callbell modified by RN and pt able to press with index finger) Nurse Communication: Other (comment) (discussion with Dr Jarvis Newcomer to go by his symptoms and not a certain MAP when positioniing him upright)       Time: 7371-0626 PT Time Calculation (min) (ACUTE ONLY): 35 min  Charges:  $Therapeutic Exercise: 8-22 mins $Therapeutic Activity: 8-22 mins                      [name], PT Pager [phone]    [name] 06/07/2021, 11:23 AM

## 2021-06-07 NOTE — Progress Notes (Signed)
[facility] PROGRESS NOTE  Andrew Rivera PIR:[id number] DOB: [birth year] DOA: 04/23/2021 PCP: Larena Glassman, MD  Status: Remains inpatient appropriate because:Unsafe d/c plan and Inpatient level of care appropriate due to severity of illness  Dispo: The patient is from:  LTAC/[facility]              Anticipated d/c is to: LTAC              Patient currently is medically stable to d/c.   Difficult to place patient Yes    Level of care: ICU  Code Status: Full Family Communication: Patient only DVT prophylaxis: Lovenox COVID vaccination status: Unknown  HPI: 70 year old male with chronic trach/chronic vent at [facility] - vent side, not LTAC, history of constipation chronic irritation, prior pneumonia, critical illness myopathy functional quadriplegia, comes into the hospital with abdominal distention.  CT on admission showed colonic distention/ileus without obstruction or perforation. GI consulted, he was placed on aggressive bowel regimen with resolution of his ileus. GI signed off and eventually was transferred to the hospitalist service.  Patient had an event of hypotension and fever on 7/8, he was briefly on antibiotics but all cultures were negative and has been monitored off antibiotics since 7/10.  He has had no further issues, awaiting placement back.   Subjective: Patient alert and requesting nursing staff to suction him.  No other specific complaints.  Objective: Vitals:   06/07/21 0732 06/07/21 0742  BP:    Pulse:  (!) 56  Resp:  16  Temp: 98.5 F (36.9 C)   SpO2:  98%    Intake/Output Summary (Last 24 hours) at 06/07/2021 0809 Last data filed at 06/07/2021 0600 Gross per 24 hour  Intake 1810 ml  Output 450 ml  Net 1360 ml   Filed Weights   05/28/21 0500 05/30/21 0500 05/31/21 0500  Weight: 87.2 kg 90.6 kg 88.9 kg    Exam:  Constitutional: NAD, calm, comfortable Respiratory: 6.0 XLT Shiley cuffed trach, PRVC mode, set rate 16, VT 550, PEEP 5, FiO2 28% ,  lungs clear to auscultation bilaterally, no wheezing, no crackles. Normal respiratory effort. No accessory muscle use.  Cardiovascular: Regular rate and rhythm, no murmurs / rubs / gallops. No extremity edema. 2+ pedal pulses.  Abdomen: no tenderness, no masses palpated.  Left side PEG tube site unremarkable tube feedings infusing, bowel sounds positive. LBM 8/14 Neurologic: CN 2-12 grossly intact. Sensation intact, Strength 5/5 x all 4 extremities.  Psychiatric: Normal judgment and insight. Alert and oriented x 3. Normal mood.  Able to appropriately mouth words and convey thoughts and requests.   Assessment/Plan: Acute problems: Colonic distention/ileus, resolved:  - Recurrent history of the same, he was admitted to the hospital and GI was consulted.   - Likely opioid induced, he had a moviprep with resolution of his symptoms.   - Continue scheduled bowel regimen. Ileus has essentially resolved.  8/16 add fiber source per tube twice daily   Functional quadriplegia with associated chronic respiratory failure, tracheostomy/vent dependent - All stable chronic problems with continuation of PTA regimen. - Continue PT efforts to maximize functional ability   Malnutrition/PEG tube dependent Nutrition Problem: Inadequate oral intake Etiology: inability to eat Signs/Symptoms: NPO status Interventions: Tube feeding-Jevity 1.5 at 55 cc/h  -Continue free water, Prosource feeding supplement  Hypotension:  - No new anemia/evidence of bleeding. No new fever, leukocytosis, or nidus of infection. Volume status appears appropriate. 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  - Continue midodrine.  Blood pressures remain variable and at times dipped into the high 80s -Continue to hold lisinopril.   Hyperkalemia, resolved:  - Given lokelma previously, DC'ed lisinopril as above.    Anemia of chronic disease:  --No bleeding. Hgb stable.  -  Check H/H periodically - no need for chronic surveillance   Chronic low back pain, neuropathy, mood disorder:  - Continue home lyrica, fentanyl patch, ativan. Cymbalta previously discontinued.   Right toe ingrown nail -Does not appear to be infectious, continue cleaning per wound care: -Wash with soap and warm water, ten pain with betadine swabstick twice daily.      Data Reviewed: Basic Metabolic Panel: Recent Labs  Lab 06/02/21 0547 06/03/21 0507 06/04/21 0555 06/05/21 0120  NA 139 138  --  139  K 4.2 4.0  --  4.2  CL 106 102  --  107  CO2 26 27  --  25  GLUCOSE 123* 119*  --  112*  BUN 29* 35*  --  37*  CREATININE <0.30* <0.30* <0.30* <0.30*  CALCIUM 8.8* 8.9  --  8.8*  MG 2.2  --   --  2.4   Liver Function Tests: Recent Labs  Lab 06/02/21 0547 06/05/21 0120  AST 12* 13*  ALT 13 14  ALKPHOS 80 80  BILITOT 0.4 0.3  PROT 6.1* 6.2*  ALBUMIN 2.6* 2.7*   No results for input(s): LIPASE, AMYLASE in the last 168 hours. No results for input(s): AMMONIA in the last 168 hours. CBC: Recent Labs  Lab 06/02/21 0547 06/05/21 0120  WBC 8.0 7.0  NEUTROABS 5.5 4.4  HGB 8.7* 8.5*  HCT 27.0* 27.3*  MCV 94.4 95.8  PLT 195 212   Cardiac Enzymes: No results for input(s): CKTOTAL, CKMB, CKMBINDEX, TROPONINI in the last 168 hours. BNP (last 3 results) No results for input(s): BNP in the last 8760 hours.  ProBNP (last 3 results) No results for input(s): PROBNP in the last 8760 hours.  CBG: No results for input(s): GLUCAP in the last 168 hours.  No results found for this or any previous visit (from the past 240 hour(s)).   Studies: No results found.  Scheduled Meds:  acetaminophen (TYLENOL) oral liquid 160 mg/5 mL  960 mg Per Tube Q8H   bethanechol  10 mg Per Tube TID   enoxaparin (LOVENOX) injection  40 mg Subcutaneous Q24H   feeding supplement (PROSource TF)  45 mL Per Tube TID   fentaNYL  1 patch Transdermal Q72H   fiber  1 packet Per Tube BID   free water   100 mL Per Tube Q4H   mouth rinse  15 mL Mouth Rinse BID   melatonin  3 mg Per Tube QHS   midodrine  10 mg Per Tube TID WC   pantoprazole sodium  40 mg Per Tube Daily   polyethylene glycol  17 g Per Tube BID   pregabalin  100 mg Per Tube TID   sennosides  10 mL Per Tube BID   sodium chloride flush  10-40 mL Intracatheter Q12H   Continuous Infusions:  sodium chloride Stopped (05/02/21 0501)   feeding supplement (JEVITY 1.5 CAL/FIBER) 1,000 mL (06/06/21 0420)    Active Problems:   Ileus (HCC)   Abdominal distension   Constipation   Hypokalemia   Chronic respiratory failure with hypoxia [facility])   Consultants: PCCM Gastroenterology  Procedures: None  Antibiotics: None   Time spent: 35 minutes    Junious Silk  ANP  [facility] 7 am - 330 pm/M-F for direct patient care and secure chat Please refer to Amion for contact info 44  days

## 2021-06-07 NOTE — TOC Progression Note (Signed)
Transition of Care [facility]) - Progression Note    Patient Details  Name: Andrew Rivera MRN: [id number] Date of Birth: 08/10/1951  Transition of Care [facility]) CM/SW Contact  Erin Sons, [place] Phone Number: 06/07/2021, 4:03 PM  Clinical Narrative:     0830: CSW received voicemail from [employer] confirming they had received expedited appeals and would be coming to a decision in 72 hours. They requested any new PT/OT notes be faxed  1601: CSW faxed PT note from 06/07/21 to [phone] for expidited appeal. Appeal# [id number]  Expected Discharge Plan: Long Term Acute Care (LTAC) Barriers to Discharge: Insurance Authorization  Expected Discharge Plan and Services Expected Discharge Plan: Long Term Acute Care (LTAC)         Expected Discharge Date: 05/17/21                                     Social Determinants of Health (SDOH) Interventions    Readmission Risk Interventions No flowsheet data found.

## 2021-06-08 DIAGNOSIS — G901 Familial dysautonomia [Riley-Day]: Secondary | ICD-10-CM

## 2021-06-08 DIAGNOSIS — J9601 Acute respiratory failure with hypoxia: Secondary | ICD-10-CM

## 2021-06-08 LAB — CBC
HCT: 29.4 % — ABNORMAL LOW (ref 39.0–52.0)
Hemoglobin: 9.4 g/dL — ABNORMAL LOW (ref 13.0–17.0)
MCH: 30.4 pg (ref 26.0–34.0)
MCHC: 32 g/dL (ref 30.0–36.0)
MCV: 95.1 fL (ref 80.0–100.0)
Platelets: 247 10*3/uL (ref 150–400)
RBC: 3.09 MIL/uL — ABNORMAL LOW (ref 4.22–5.81)
RDW: 16.9 % — ABNORMAL HIGH (ref 11.5–15.5)
WBC: 6.8 10*3/uL (ref 4.0–10.5)
nRBC: 0 % (ref 0.0–0.2)

## 2021-06-08 LAB — COMPREHENSIVE METABOLIC PANEL
ALT: 15 U/L (ref 0–44)
AST: 15 U/L (ref 15–41)
Albumin: 2.9 g/dL — ABNORMAL LOW (ref 3.5–5.0)
Alkaline Phosphatase: 80 U/L (ref 38–126)
Anion gap: 8 (ref 5–15)
BUN: 35 mg/dL — ABNORMAL HIGH (ref 8–23)
CO2: 26 mmol/L (ref 22–32)
Calcium: 9.1 mg/dL (ref 8.9–10.3)
Chloride: 105 mmol/L (ref 98–111)
Creatinine, Ser: <0.3 mg/dL — ABNORMAL LOW (ref 0.61–1.24)
Glucose, Bld: 120 mg/dL — ABNORMAL HIGH (ref 70–99)
Potassium: 3.9 mmol/L (ref 3.5–5.1)
Sodium: 139 mmol/L (ref 135–145)
Total Bilirubin: 0.6 mg/dL (ref 0.3–1.2)
Total Protein: 6.5 g/dL (ref 6.5–8.1)

## 2021-06-08 LAB — MAGNESIUM: Magnesium: 2.5 mg/dL — ABNORMAL HIGH (ref 1.7–2.4)

## 2021-06-08 LAB — PHOSPHORUS: Phosphorus: 4.3 mg/dL (ref 2.5–4.6)

## 2021-06-08 MED ORDER — MIDODRINE HCL 5 MG PO TABS
10.0000 mg | ORAL_TABLET | Freq: Every day | ORAL | Status: DC
  Administered 2021-06-08: 10 mg via ORAL
  Filled 2021-06-08 (×2): qty 2

## 2021-06-08 NOTE — Progress Notes (Signed)
   NAME:  Andrew Rivera, MRN:  [id number], DOB:  1951/09/22, LOS: 45 ADMISSION DATE:  04/23/2021, CONSULTATION DATE:  04/24/21 REFERRING MD:  ED, CHIEF COMPLAINT:  abdominal pain   History of Present Illness:  70 y/o male, former smoker, with hx of PNA and critical illness myopathy presented with abdominal distention and pain from [facility].  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   Pertinent  Medical History  Chronic pain, IJ thromboembolism, Septic shock, Pneumonia, Constipation with ileus, Critical illness myopathy, MSSA Bacteremia in November 2020, Pressure wounds  Significant Hospital Events: Including procedures, antibiotic start and stop dates in addition to other pertinent events   7/02 Admit 7/08 fever 101.7, start vancomycin/cefepime 7/12 trach collar trial did not last long 7/14 Ileus resolved, tolerating PSV weans 7/17 Routine trach change #6 cuffed Shiley 7/22 ATC briefly but failed due to dyspnea 8/4 trach upsized to distal XLT to prevent additional stomal dilation due to significant cuff leak 8/17.  Reevaluated.  Tolerating pressure support ventilation but unable to tolerate aerosol trach collar.  Asking SLP to see to assist with inline PMV trials  Interim History / Subjective:  No acute distress.  Tolerating intermittent pressure support ventilation  Objective   Blood pressure (Abnormal) 94/33, pulse (Abnormal) 51, temperature 98.3 F (36.8 C), temperature source Oral, resp. rate 16, height 5\' 8"  (1.727 m), weight 86.5 kg, SpO2 97 %.    Vent Mode: PRVC FiO2 (%):  [28 %] 28 % Set Rate:  [16 bmp] 16 bmp Vt Set:  [550 mL] 550 mL PEEP:  [5 cmH20] 5 cmH20 Pressure Support:  [10 cmH20] 10 cmH20 Plateau Pressure:  [17 cmH20-18 cmH20] 17 cmH20   Intake/Output Summary (Last 24 hours) at 06/08/2021 0802 Last data filed at 06/08/2021 0700 Gross per 24 hour  Intake 2070 ml  Output 950 ml   Net 1120 ml   Filed Weights   05/30/21 0500 05/31/21 0500 06/08/21 0500  Weight: 90.6 kg 88.9 kg 86.5 kg    Examination: General: This is a chronically ill-appearing white male he is currently on full ventilator support has been tolerating pressure support ventilation daily HEENT distal XLT tracheostomy is unremarkable.  There are no lesions.  Minimal secretions. Pulmonary: Bilateral scattered rhonchi.  Minimal vent settings. Cardiac: Regular rate and rhythm Extremities: Contracted hands.  Dependent edema.  Brisk capillary refill. Abdomen: Round, PEG tube in place.  Positive bowel sounds. Neuro: Awake, communicative.  Unable to move.    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 Respiratory endurance, as well as cough mechanics further complicated by deconditioning and anxiety prohibiting liberation from vent.  He is ventilator dependent. Plan Continue daily PSV trials, okay to intermittently try aerosol trach collar however I suspect this will be for only short periods of time Will ask speech-language pathology to evaluate for in-line PMV trials Routine trach care VAP bundle  We will continue to follow biweekly while he is here at [facility] ACNP-BC [facility] Pulmonary/Critical Care Pager # [phone] OR # [phone] if no answer

## 2021-06-08 NOTE — Progress Notes (Signed)
CSW obtained patient's Medicaid number from [facility] - [id number] N.  CSW spoke with Prem at [facility] who states he will discuss patient with Librarian, academic at facility tomorrow and will return call to CSW with an answer.  Edwin Dada, MSW, LCSW Transitions of Care  Clinical Social Worker II [phone]

## 2021-06-08 NOTE — Progress Notes (Addendum)
[facility] PROGRESS NOTE  MONTERIUS ROLF PPJ:[id number] DOB: 15-May-1951 DOA: 04/23/2021 PCP: Larena Glassman, MD  Status: Remains inpatient appropriate because:Unsafe d/c plan and Inpatient level of care appropriate due to severity of illness  Dispo: The patient is from: SNF ventilator capable at [facility]              Anticipated d/c is to: SNF-vent capable.  Unfortunately appeal with insurance company pending was denied.  He does have Medicaid and therefore should be able to return to [facility] vent SNF              Patient currently is medically stable to d/c.   Difficult to place patient Yes    Level of care: ICU  Code Status: Full Family Communication: Patient only DVT prophylaxis: Lovenox COVID vaccination status: Unknown  HPI: 70 year old male with chronic trach/chronic vent at [facility] SNF - vent side, not LTAC, history of constipation chronic irritation, prior pneumonia, critical illness myopathy functional quadriplegia, comes into the hospital with abdominal distention.  CT on admission showed colonic distention/ileus without obstruction or perforation. GI consulted, he was placed on aggressive bowel regimen with resolution of his ileus. GI signed off and eventually was transferred to the hospitalist service.  Patient had an event of hypotension and fever on 7/8, he was briefly on antibiotics but all cultures were negative and has been monitored off antibiotics since 7/10.  He has had no further issues, awaiting placement back.   Subjective: Alert and watching television -denies need for suctioning.  No complaints otherwise  Objective: Vitals:   06/08/21 0700 06/08/21 0725  BP: (!) 94/33   Pulse: (!) 51   Resp: 16   Temp:  98.3 F (36.8 C)  SpO2: 97%     Intake/Output Summary (Last 24 hours) at 06/08/2021 0738 Last data filed at 06/08/2021 0700 Gross per 24 hour  Intake 2125 ml  Output 950 ml  Net 1175 ml   Filed Weights   05/30/21 0500 05/31/21 0500 06/08/21 0500   Weight: 90.6 kg 88.9 kg 86.5 kg    Exam: Constitutional: NAD, calm, comfortable Respiratory: 6.0 XLT Shiley cuffed trach, PRVC mode, set rate 16, VT 550, PEEP 5, PS 10, FiO2 28% , lungs clear to auscultation bilaterally. Cardiovascular: Regular rate and rhythm, no murmurs / rubs / gallops. No extremity edema. 2+ pedal pulses.  Current nocturnal hypotension into the 80s with MAP's in the 40s.  Currently normotensive Abdomen: Left side PEG tube site unremarkable tube feedings infusing, bowel sounds positive.  Nontender to palpation. Neurological: cranial nerves II through XII are grossly intact.  Sensation only intact from the shoulders up.  Patient has functional quadriplegia without any purposeful movement. Psychiatric: Normal judgment and insight. Alert and oriented x 3. Normal mood.  Able to appropriately mouth words and convey thoughts and requests.   Assessment/Plan: Acute problems: Colonic distention/ileus, resolved:  - Recurrent history of the same, he was admitted to the hospital and GI was consulted.   - Likely opioid induced, he had a moviprep with resolution of his symptoms.   - Continue scheduled bowel regimen. Ileus has essentially resolved.  8/16 add fiber source per tube twice daily   Functional quadriplegia with associated chronic respiratory failure, tracheostomy/vent dependent - All stable chronic problems with continuation of PTA regimen. 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  Blood pressures remain variable and at times dip into the high 80s; MAP lower at night (40s) so will add a bedtime dose of  Midodrine to the TID w/ meals dose -Continue to hold lisinopril.   Hyperkalemia, resolved:  - Given lokelma previously, DC'ed lisinopril as above.    Anemia of chronic disease:  --No bleeding. Hgb stable.  - Check H/H periodically - no need for chronic surveillance   Chronic low back pain, neuropathy, mood disorder:  - Continue home lyrica, fentanyl patch, ativan. Cymbalta previously discontinued.   Right toe ingrown nail -Does not appear to be infectious, continue cleaning per wound care: -Wash with soap and warm water, ten pain with betadine swabstick twice daily.      Data Reviewed: Basic Metabolic Panel: Recent Labs  Lab 06/02/21 0547 06/03/21 0507 06/04/21 0555 06/05/21 0120 06/08/21 0443  NA 139 138  --  139 139  K 4.2 4.0  --  4.2 3.9  CL 106 102  --  107 105  CO2 26 27  --  25 26  GLUCOSE 123* 119*  --  112* 120*  BUN 29* 35*  --  37* 35*  CREATININE <0.30* <0.30* <0.30* <0.30* <0.30*  CALCIUM 8.8* 8.9  --  8.8* 9.1  MG 2.2  --   --  2.4 2.5*  PHOS  --   --   --   --  4.3   Liver Function Tests: Recent Labs  Lab 06/02/21 0547 06/05/21 0120 06/08/21 0443  AST 12* 13* 15  ALT 13 14 15   ALKPHOS 80 80 80  BILITOT 0.4 0.3 0.6  PROT 6.1* 6.2* 6.5  ALBUMIN 2.6* 2.7* 2.9*   No results for input(s): LIPASE, AMYLASE in the last 168 hours. No results for input(s): AMMONIA in the last 168 hours. CBC: Recent Labs  Lab 06/02/21 0547 06/05/21 0120 06/08/21 0443  WBC 8.0 7.0 6.8  NEUTROABS 5.5 4.4  --   HGB 8.7* 8.5* 9.4*  HCT 27.0* 27.3* 29.4*  MCV 94.4 95.8 95.1  PLT 195 212 247   Cardiac Enzymes: No results for input(s): CKTOTAL, CKMB, CKMBINDEX, TROPONINI in the last 168 hours. BNP (last 3 results) No results for input(s): BNP in the last 8760 hours.  ProBNP (last 3 results) No results for input(s): PROBNP in the last 8760 hours.  CBG: No results for input(s): GLUCAP in the last 168 hours.  No results found for this or any previous visit  (from the past 240 hour(s)).   Studies: No results found.  Scheduled Meds:  acetaminophen (TYLENOL) oral liquid 160 mg/5 mL  960 mg Per Tube Q8H   bethanechol  10 mg Per Tube TID   enoxaparin (LOVENOX) injection  40 mg Subcutaneous Q24H   feeding supplement (PROSource TF)  45 mL Per Tube TID   fentaNYL  1 patch Transdermal Q72H   fiber  1 packet Per Tube BID   free water  100 mL Per Tube Q4H   mouth rinse  15 mL Mouth Rinse BID   melatonin  3 mg Per Tube QHS   midodrine  10 mg Per Tube TID WC   pantoprazole sodium  40 mg Per Tube Daily   polyethylene glycol  17 g Per Tube BID   pregabalin  100 mg Per Tube TID   sennosides  10 mL Per Tube BID  sodium chloride flush  10-40 mL Intracatheter Q12H   Continuous Infusions:  sodium chloride Stopped (05/02/21 0501)   feeding supplement (JEVITY 1.5 CAL/FIBER) 1,000 mL (06/08/21 0545)    Active Problems:   Ileus (HCC)   Abdominal distension   Constipation   Hypokalemia   Chronic respiratory failure with hypoxia (HCC)   Ventilator dependence (HCC)   Tracheostomy dependence (HCC)   Consultants: PCCM Gastroenterology  Procedures: None  Antibiotics: None   Time spent: 35 minutes    Junious Silk ANP  [facility] 7 am - 330 pm/M-F for direct patient care and secure chat Please refer to Amion for contact info 45  days

## 2021-06-08 NOTE — Progress Notes (Signed)
RT note- Patient placed back to full support due secretions and increase work of breathing.

## 2021-06-08 NOTE — Progress Notes (Signed)
Nutrition Follow-up  DOCUMENTATION CODES:   Not applicable  INTERVENTION:   Continue tube feeding via PEG: - Jevity 1.5 @ 55 ml/hr (1320 ml/day) - ProSource TF 45 ml TID - Free water flushes of 100 ml q 4 hours  Tube feeding regimen provides 2100 kcal, 117 grams of protein, and 1003 ml of H2O.  Total free water with flushes: 1603 ml  NUTRITION DIAGNOSIS:   Inadequate oral intake related to inability to eat as evidenced by NPO status.  Ongoing, being addressed via TF  GOAL:   Patient will meet greater than or equal to 90% of their needs  Met via TF  MONITOR:   Labs, Weight trends, TF tolerance, I & O's  REASON FOR ASSESSMENT:   Ventilator    ASSESSMENT:   Pt admitted from [facility] with increasing abdominal distention and abdominal pain due to constipation and ileus. Pt with PMH of chronic pain, septic shock, chronic respiratory failure, recent ED visit for constipation, hx spinal surgeries, MSSA bacteriemia and pressure injury.  Pt remains stable to discharge back to [facility] and is awaiting bed/insurance authorization.  Per PCCM notes, pt tolerating pressure support ventilation but unable to tolerate ATC. SLP to evaluate for in-line PMV trials.  Spoke with pt and daughter at bedside. Noted pt's abdomen slightly distended. Pt denies any abdominal discomfort or pain. Pt is having type 7 bowel movements per flowsheets.  Spoke with pt's RN who reported tube feeds are going well and that pt is denying any nausea or abdominal pain/discomfort. RN has also noted pt's abdomen is distended. RN states that pt with a similar presentation yesterday. Pt with multiple loose bowel movements yesterday per RN. Will continue with current tube feeds at this time.  Admission weight: 90.9 kg (on 7/03) Current weight: 86.5 kg  Pt with non-pitting edema to BUE and BLE per nursing edema assessment. Pt also noted to have mild pitting edema to sacral region.  Patient remains on ventilator  support via trach MV: 7.5 L/min Temp (24hrs), Avg:98.6 F (37 C), Min:98.3 F (36.8 C), Max:98.8 F (37.1 C)  Medications reviewed and include: nutrisource fiber, melatonin, protonix, miralax, senokot  Labs reviewed: BUN 35, magnesium 1.5, hemoglobin 9.4  UOP: 950 ml x 24 hours  Diet Order:   Diet Order             Diet NPO time specified  Diet effective now                   EDUCATION NEEDS:   Not appropriate for education at this time  Skin:  Skin Assessment: Reviewed RN Assessment  Last BM:  06/07/21 medium type 7  Height:   Ht Readings from Last 1 Encounters:  05/29/21 5' 8"  (1.727 m)    Weight:   Wt Readings from Last 1 Encounters:  06/08/21 86.5 kg    BMI:  Body mass index is 29 kg/m.  Estimated Nutritional Needs:   Kcal:  1900-2100  Protein:  100-125 grams  Fluid:  >1.9 L/day    Gustavus Bryant, MS, RD, LDN Inpatient Clinical Dietitian Please see AMiON for contact information.

## 2021-06-08 NOTE — TOC Progression Note (Signed)
Transition of Care [facility]) - Progression Note    Patient Details  Name: Andrew Rivera MRN: [id number] Date of Birth: May 06, 1951  Transition of Care [facility]) CM/SW Contact  Erin Sons, [place] Phone Number: 06/08/2021, 11:59 AM  Clinical Narrative:     CSW received call from [employer]. CSW is informed that original SNF auth denial was upheld. Pt would not be able to received SNF coverage through [facility].   CSW notified DTP team who will be follow pt moving forward.   Expected Discharge Plan: Long Term Acute Care (LTAC) Barriers to Discharge: Insurance Authorization  Expected Discharge Plan and Services Expected Discharge Plan: Long Term Acute Care (LTAC)         Expected Discharge Date: 05/17/21                                     Social Determinants of Health (SDOH) Interventions    Readmission Risk Interventions No flowsheet data found.

## 2021-06-08 NOTE — TOC Progression Note (Signed)
Transition of Care [facility]) - Progression Note    Patient Details  Name: Andrew Rivera MRN: [id number] Date of Birth: [birth year]  Transition of Care [facility]) CM/SW [place], RN Phone Number: 06/08/2021, 2:02 PM  Clinical Narrative:    Case management met with the patient at the bedside and I explained to the patient that the patient's Medicare declined him for LTAC/ SNF placement but that Shon Baton, MSW was following up with the CM at [facility] to check on bed availability to return to the facility under his current Medicaid coverage that is active.  I will follow up with the patient's wife in the morning when she visits at the bedside.   Expected Discharge Plan: Long Term Nursing Home Barriers to Discharge: Vent Bed not available, Continued Medical Work up  Expected Discharge Plan and Services Expected Discharge Plan: [place]   Discharge Planning Services: CM Consult Post Acute Care Choice: [place] (LTC SNF/Vent bed at the facility) Living arrangements for the past 2 months: [place] Expected Discharge Date: 05/17/21                                     Social Determinants of Health (SDOH) Interventions    Readmission Risk Interventions No flowsheet data found.

## 2021-06-09 LAB — RESP PANEL BY RT-PCR (FLU A&B, COVID) ARPGX2
Influenza A by PCR: NEGATIVE
Influenza B by PCR: NEGATIVE
SARS Coronavirus 2 by RT PCR: NEGATIVE

## 2021-06-09 MED ORDER — BETHANECHOL CHLORIDE 10 MG PO TABS: 10.0000 mg | ORAL_TABLET | Freq: Three times a day (TID) | ORAL | Status: DC

## 2021-06-09 MED ORDER — JEVITY 1.5 CAL/FIBER PO LIQD: 1000.0000 mL | ORAL | Status: DC

## 2021-06-09 MED ORDER — PROSOURCE TF PO LIQD: 45.0000 mL | Freq: Three times a day (TID) | ORAL | Status: DC

## 2021-06-09 MED ORDER — HYDROMORPHONE HCL 2 MG PO TABS: 2.0000 mg | ORAL_TABLET | Freq: Three times a day (TID) | ORAL | 0 refills | Status: DC | PRN

## 2021-06-09 MED ORDER — MIDODRINE HCL 10 MG PO TABS: 10.0000 mg | ORAL_TABLET | Freq: Three times a day (TID) | ORAL | Status: DC

## 2021-06-09 MED ORDER — NUTRISOURCE FIBER PO PACK: 1.0000 | PACK | Freq: Two times a day (BID) | ORAL | Status: DC

## 2021-06-09 MED ORDER — LORAZEPAM 0.5 MG PO TABS: 0.5000 mg | ORAL_TABLET | Freq: Three times a day (TID) | ORAL | 0 refills | Status: DC | PRN

## 2021-06-09 MED ORDER — PREGABALIN 100 MG PO CAPS: 100.0000 mg | ORAL_CAPSULE | Freq: Three times a day (TID) | ORAL | Status: DC

## 2021-06-09 MED ORDER — SENNOSIDES 8.8 MG/5ML PO SYRP: 10.0000 mL | ORAL_SOLUTION | Freq: Two times a day (BID) | ORAL | 0 refills | Status: DC

## 2021-06-09 MED ORDER — POLYETHYLENE GLYCOL 3350 17 G PO PACK: 17.0000 g | PACK | Freq: Two times a day (BID) | ORAL | 0 refills | Status: AC

## 2021-06-09 MED ORDER — FENTANYL 25 MCG/HR TD PT72: 1.0000 | MEDICATED_PATCH | TRANSDERMAL | 0 refills | Status: DC

## 2021-06-09 MED ORDER — PREGABALIN 100 MG PO CAPS: 100.0000 mg | ORAL_CAPSULE | Freq: Three times a day (TID) | ORAL | 0 refills | Status: DC

## 2021-06-09 MED ORDER — MIDODRINE HCL 10 MG PO TABS: 10.0000 mg | ORAL_TABLET | Freq: Every day | ORAL | Status: DC

## 2021-06-09 NOTE — Progress Notes (Signed)
Physical Therapy Treatment Patient Details Name: Andrew Rivera MRN: [id number] DOB: 1951/07/18 Today's Date: 06/09/2021    History of Present Illness 70 year old male  comes into the hospital 04/23/21 with abdominal distention. GI consulted, he was placed on aggressive bowel regimen with resolution of his ileus. Episode of hypotension and fever 7/8. Trach exchange 8/4  PMH chronic trach/chronic vent at [facility] SNF - vent side, not LTAC, history of constipation chronic irritation, prior pneumonia, critical illness myopathy functional quadriplegia    PT Comments    Patient's wife in and out during session (due to receiving phone calls and stepped out when pt required cleaning from loose stool). Goals assessed with pt making progress towards both previous goals (did not meet but did make progress). Attempted full chair position (foot board removed) with pt unable to tolerate due to back pain. Was able to tolerate full chair position with footboard in place. Discussed goals for next 2 weeks to include sitting EOB tolerance/ability. Pt in agreement.    Follow Up Recommendations  Other (comment) (trial of acute PT to see if pt can make progress with upright tolerance)     Equipment Recommendations  None recommended by PT    Recommendations for Other Services       Precautions / Restrictions Precautions Precautions: Other (comment) Precaution Comments: chronic trach    Mobility  Bed Mobility Overal bed mobility: Needs Assistance Bed Mobility: Rolling;Supine to Sit Rolling: Total assist;+2 for physical assistance   Supine to sit: Total assist (via chair position in bed)     General bed mobility comments: BP with MAP 65-68 and asymptomatic as elevated to chair position    Transfers                 General transfer comment: OOB with lift deferred due to diarrhea; pt cleaned prior to putting in chair-like position  Ambulation/Gait                 Stairs              Wheelchair Mobility    Modified Rankin (Stroke Patients Only)       Balance                                            Cognition Arousal/Alertness: Awake/alert Behavior During Therapy: WFL for tasks assessed/performed Overall Cognitive Status: Within Functional Limits for tasks assessed                                 General Comments: cognition not specifically tested; lipspeaks and appropriate with questions asked      Exercises General Exercises - Upper Extremity Elbow Flexion: AAROM;Both;10 reps Elbow Extension: AAROM;Both;10 reps General Exercises - Lower Extremity Ankle Circles/Pumps: PROM;Both;5 reps (followed by prolonged stretch) Short Arc Quad: Both;AROM;10 reps;Seated (while PT held leg inwardly rotated to neutral position) Heel Slides: AAROM;Strengthening;5 reps;Right (assisted flexion; resisted extension (rt stronger))    General Comments General comments (skin integrity, edema, etc.): Wife present during session and reports she does the movements with his arms and legs that she can (she is very petite and states she is not strong enough to lift his extremities the way therapy does). Instructed in technique for hip IR stretching (30 sec x 5 reps)      Pertinent Vitals/Pain Pain Assessment:  Faces Faces Pain Scale: Hurts whole lot Pain Location: Rt hip with endrange of flexion Pain Descriptors / Indicators: Discomfort;Grimacing Pain Intervention(s): Monitored during session;Repositioned    Home Living                      Prior Function            PT Goals (current goals can now be found in the care plan section) Acute Rehab PT Goals Patient Stated Goal: to return to [facility] PT Goal Formulation: With patient Time For Goal Achievement: 06/23/21 Potential to Achieve Goals: Poor Progress towards PT goals: Progressing toward goals (Goal #1-progressed to bed fully upright with foot board in place (could not  tolerate without foot board due to rt hip and back pain); Goal #2-progressed to hip flexion 70 degrees and hip pain 8/10)    Frequency    Min 2X/week      PT Plan Current plan remains appropriate    Co-evaluation              AM-PAC PT "6 Clicks" Mobility   Outcome Measure  Help needed turning from your back to your side while in a flat bed without using bedrails?: Total Help needed moving from lying on your back to sitting on the side of a flat bed without using bedrails?: Total Help needed moving to and from a bed to a chair (including a wheelchair)?: Total Help needed standing up from a chair using your arms (e.g., wheelchair or bedside chair)?: Total Help needed to walk in hospital room?: Total Help needed climbing 3-5 steps with a railing? : Total 6 Click Score: 6    End of Session Equipment Utilized During Treatment: Oxygen;Other (comment) (vent) Activity Tolerance: Patient tolerated treatment well Patient left: in bed;with call bell/phone within reach;with bed alarm set (pt's callbell modified by RN and pt able to press with index finger) Nurse Communication: Other (comment) (discussion with Dr Jarvis Newcomer to go by his symptoms and not a certain MAP when positioniing him upright) PT Visit Diagnosis: Pain Pain - Right/Left: Right Pain - part of body: Hip     Time: 1202-1249 PT Time Calculation (min) (ACUTE ONLY): 47 min  Charges:  $Therapeutic Exercise: 23-37 mins $Therapeutic Activity: 8-22 mins                      [name], PT Pager [phone]    [name] 06/09/2021, 1:06 PM

## 2021-06-09 NOTE — Progress Notes (Signed)
[facility] PROGRESS NOTE  TAISON CELANI DQQ:[id number] DOB: Feb 05, 1951 DOA: 04/23/2021 PCP: Larena Glassman, MD  Status: Remains inpatient appropriate because:Unsafe d/c plan and Inpatient level of care appropriate due to severity of illness  Dispo: The patient is from: SNF ventilator capable at [facility]              Anticipated d/c is to: SNF-vent capable.  Unfortunately appeal with insurance company pending was denied.  He does have Medicaid and therefore should be able to return to [facility] vent SNF pending approval from facility              Patient currently is medically stable to d/c.   Difficult to place patient Yes    Level of care: ICU  Code Status: Full Family Communication: Patient only DVT prophylaxis: Lovenox COVID vaccination status: Unknown  HPI: 70 year old male with chronic trach/chronic vent at [facility] SNF - vent side, not LTAC, history of constipation chronic irritation, prior pneumonia, critical illness myopathy functional quadriplegia, comes into the hospital with abdominal distention.  CT on admission showed colonic distention/ileus without obstruction or perforation. GI consulted, he was placed on aggressive bowel regimen with resolution of his ileus. GI signed off and eventually was transferred to the hospitalist service.  Patient had an event of hypotension and fever on 7/8, he was briefly on antibiotics but all cultures were negative and has been monitored off antibiotics since 7/10.  He has had no further issues, awaiting placement back.   Subjective: Awake and alert.  Watching TV.  Denies any issues.  Objective: Vitals:   06/09/21 0500 06/09/21 0600  BP: (!) 86/52 (!) 104/35  Pulse: (!) 48 (!) 50  Resp: 16 16  Temp:    SpO2: 99% 97%    Intake/Output Summary (Last 24 hours) at 06/09/2021 0737 Last data filed at 06/09/2021 0600 Gross per 24 hour  Intake 1885 ml  Output 1985 ml  Net -100 ml   Filed Weights   05/31/21 0500 06/08/21 0500 06/09/21  0500  Weight: 88.9 kg 86.5 kg 88.5 kg    Exam: Constitutional: NAD, calm, comfortable Respiratory: 6.0 XLT Shiley cuffed trach, PRVC mode, set rate 16, VT 550, PEEP 5, PS 10, FiO2 28% , lungs clear to auscultation bilaterally. Cardiovascular: Regular rate and rhythm, no murmurs / rubs / gallops. No extremity edema.  Continues with intermittent hypotension mostly prominent at HS Abdomen: Left side PEG tube site unremarkable tube feedings infusing, bowel sounds positive.  Nontender to palpation. LBM 8/17 Neurological: cranial nerves II through XII are grossly intact.  Sensation only intact from the shoulders up.  Patient has functional quadriplegia without any purposeful movement. Psychiatric: Normal judgment and insight. Alert and oriented x 3. Normal mood.  Able to mouth words and convey thoughts and requests.   Assessment/Plan: Acute problems: Colonic distention/ileus, resolved:  - Recurrent history of the same, he was admitted to the hospital and GI was consulted.   - Likely opioid induced, he had a moviprep with resolution of his symptoms.   - Continue scheduled bowel regimen. Ileus has essentially resolved.  -8/16 add fiber source per tube twice daily   Functional quadriplegia with associated chronic respiratory failure, tracheostomy/vent dependent - All stable chronic problems with continuation of PTA regimen. 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  Blood pressures remain variable and at times dip into the high 80s; MAP lower at night (40s)/17 added bedtime dose of Midodrine in addition to the TID w/ meals  dose-tenuous to have BP readings lower than 90 systolic but MAP in the 50s.  Mentating appropriately -Continue to hold lisinopril.   Hyperkalemia, resolved:  - Given lokelma previously, DC'ed lisinopril as above.    Anemia of chronic disease:  --No bleeding. Hgb stable.  - Check H/H periodically - no need for chronic surveillance   Chronic low back pain, neuropathy, mood disorder:  - Continue home lyrica, fentanyl patch, ativan. Cymbalta previously discontinued.   Right toe ingrown nail -Does not appear to be infectious, continue cleaning per wound care: -Wash with soap and warm water, ten pain with betadine swabstick twice daily.      Data Reviewed: Basic Metabolic Panel: Recent Labs  Lab 06/03/21 0507 06/04/21 0555 06/05/21 0120 06/08/21 0443  NA 138  --  139 139  K 4.0  --  4.2 3.9  CL 102  --  107 105  CO2 27  --  25 26  GLUCOSE 119*  --  112* 120*  BUN 35*  --  37* 35*  CREATININE <0.30* <0.30* <0.30* <0.30*  CALCIUM 8.9  --  8.8* 9.1  MG  --   --  2.4 2.5*  PHOS  --   --   --  4.3   Liver Function Tests: Recent Labs  Lab 06/05/21 0120 06/08/21 0443  AST 13* 15  ALT 14 15  ALKPHOS 80 80  BILITOT 0.3 0.6  PROT 6.2* 6.5  ALBUMIN 2.7* 2.9*   No results for input(s): LIPASE, AMYLASE in the last 168 hours. No results for input(s): AMMONIA in the last 168 hours. CBC: Recent Labs  Lab 06/05/21 0120 06/08/21 0443  WBC 7.0 6.8  NEUTROABS 4.4  --   HGB 8.5* 9.4*  HCT 27.3* 29.4*  MCV 95.8 95.1  PLT 212 247   Cardiac Enzymes: No results for input(s): CKTOTAL, CKMB, CKMBINDEX, TROPONINI in the last 168 hours. BNP (last 3 results) No results for input(s): BNP in the last 8760 hours.  ProBNP (last 3 results) No results for input(s): PROBNP in the last 8760 hours.  CBG: No results for input(s): GLUCAP in the last 168 hours.  No results found for this or any previous visit (from the past 240 hour(s)).   Studies: No results found.  Scheduled  Meds:  acetaminophen (TYLENOL) oral liquid 160 mg/5 mL  960 mg Per Tube Q8H   bethanechol  10 mg Per Tube TID   enoxaparin (LOVENOX) injection  40 mg Subcutaneous Q24H   feeding supplement (PROSource TF)  45 mL Per Tube TID   fentaNYL  1 patch Transdermal Q72H   fiber  1 packet Per Tube BID   free water  100 mL Per Tube Q4H   mouth rinse  15 mL Mouth Rinse BID   melatonin  3 mg Per Tube QHS   midodrine  10 mg Per Tube TID WC   midodrine  10 mg Oral QHS   pantoprazole sodium  40 mg Per Tube Daily   polyethylene glycol  17 g Per Tube BID   pregabalin  100 mg Per Tube TID   sennosides  10 mL Per Tube BID   sodium chloride flush  10-40 mL Intracatheter Q12H   Continuous Infusions:  sodium chloride Stopped (05/02/21 0501)   feeding supplement (JEVITY 1.5 CAL/FIBER) 1,000 mL (06/09/21 0154)    Active Problems:   Ileus (HCC)   Abdominal distension   Constipation   Hypokalemia   Chronic respiratory failure with hypoxia (HCC)   Ventilator dependence (HCC)   Tracheostomy dependence (HCC)   Dysautonomia (HCC)   Consultants: PCCM Gastroenterology  Procedures: None  Antibiotics: None   Time spent: 35 minutes    Junious Silk ANP  [facility] 7 am - 330 pm/M-F for direct patient care and secure chat Please refer to Amion for contact info 46  days

## 2021-06-09 NOTE — TOC Progression Note (Signed)
Transition of Care [facility]) - Progression Note    Patient Details  Name: Andrew Rivera MRN: [id number] Date of Birth: 1951-06-29  Transition of Care [facility]) CM/SW Contact  Janae Bridgeman, RN Phone Number: 06/09/2021, 1:18 PM  Clinical Narrative:    Case management called and spoke with the patient's wife on the phone while she was present at the bedside with the patient in the hospital room.  The patient's wife is aware that [facility] had declined the patient admission to the LTAC level of care at [facility] and that the patient would return to [facility] LTC side under his Medicaid coverage.  The patient's wife is aware that the patient would transfer to the facility once the discharge orders and summary are complete and transportation via [facility] was arranged by Edwin Dada, MSW.  The patient's wife explained that she is disappointed that the patient was unable to be approved for LTAC but she would continue to communicate to the attending physician and respiratory therapist at [facility] to help support patient's progress in weaning from the ventilator.  The patient's long term goal is to return to home once he is no longer in need of ventilatory support.  I spoke with the wife and encouraged her to communicate with [facility] and possible speak to a complex nurse case manager to continue to support the patient's wishes to progress to the LTAC level of care.  I gave the patient's wife the contact number for [facility] for her to follow up as needed.  CM and MSW will continue to follow the patient for his transfer back to [facility] today via [facility].   Expected Discharge Plan: Long Term Nursing Home Barriers to Discharge: Vent Bed not available, Continued Medical Work up  Expected Discharge Plan and Services Expected Discharge Plan: Long Term Nursing Home   Discharge Planning Services: CM Consult Post Acute Care Choice: Skilled Nursing Facility (LTC SNF/Vent bed at the  facility) Living arrangements for the past 2 months: Skilled Nursing Facility Expected Discharge Date: 05/17/21                                     Social Determinants of Health (SDOH) Interventions    Readmission Risk Interventions No flowsheet data found.

## 2021-06-09 NOTE — Plan of Care (Signed)
Patient discharge to [facility]

## 2021-06-09 NOTE — Progress Notes (Addendum)
CSW faxed patient's clinicals to Prem at [facility] for review. Patient will discharge to [facility] today via [facility]. The number to call for report is [phone]. The accepting MD is Dr. Marcene Brawn. The patient will go to room 303A.   NP ordered rapid COVID - RN aware.  Edwin Dada, MSW, LCSW Transitions of Care  Clinical Social Worker II [phone]

## 2021-06-09 NOTE — Progress Notes (Signed)
Patient IV removed and trach care performed. Evening meds giving  and PEG tube flushed. Placed patients  fentanyl patch 25 mg to left arm. [place] received updates. Report and paper work given to [facility]. Per patient request external urinary cath left in place.

## 2021-06-09 NOTE — Discharge Summary (Signed)
Physician Discharge Summary  Andrew Rivera FFM:[id number] DOB: 11/22/1950 DOA: 04/23/2021  PCP: Larena Glassman, MD  Admit date: 04/23/2021 Discharge date: 06/09/2021  Time spent: 35 minutes  Recommendations for Outpatient Follow-up:  Ambulatory referral has been sent to [facility] neurology for post hospital follow-up.  It is recommended he follow-up with physician Nita Sickle He will discharge back to [facility] ventilator capable SNF Patient has been requiring intermittent pressure support especially overnight to help with increased work of breathing.   Discharge Diagnoses:  Active Problems:   Ileus (HCC)   Abdominal distension   Constipation   Hypokalemia   Chronic respiratory failure with hypoxia (HCC)   Ventilator dependence (HCC)   Tracheostomy dependence (HCC)   Dysautonomia (HCC)    Discharge Condition: Stable  Diet recommendation: Jevity with fiber tube feeding at 55 cc/h  Filed Weights   05/31/21 0500 06/08/21 0500 06/09/21 0500  Weight: 88.9 kg 86.5 kg 88.5 kg    History of present illness:  70 year old male with chronic trach/chronic vent at [facility] SNF - vent side, not LTAC, history of constipation chronic irritation, prior pneumonia, critical illness myopathy functional quadriplegia, comes into the hospital with abdominal distention.  CT on admission showed colonic distention/ileus without obstruction or perforation. GI consulted, he was placed on aggressive bowel regimen with resolution of his ileus. GI signed off and eventually was transferred to the hospitalist service.  Patient had an event of hypotension and fever on 7/8, he was briefly on antibiotics but all cultures were negative and has been monitored off antibiotics since 7/10.  He has had no further issues, awaiting placement back.  Hospital Course:  Colonic distention/ileus, resolved:  - Recurrent history of the same, he was admitted to the hospital and GI was consulted.   - Likely opioid induced, he had a  moviprep with resolution of his symptoms.   - Continue scheduled bowel regimen. Ileus has essentially resolved.  -8/16 added fiber source per tube twice daily which will be continued upon discharge   Functional quadriplegia with associated chronic respiratory failure, tracheostomy/vent dependent - All stable chronic problems with continuation of PTA regimen. 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  Blood pressures remain variable and at times dip into the high 80s; MAP lower at night (40s)/17 added bedtime dose of Midodrine in addition to the TID w/ meals dose-tenuous to have BP readings lower than 90 systolic but MAP in the 50s.  Mentating appropriately -No peripheral discontinued during this admission   Hyperkalemia, resolved:  - Given lokelma previously, DC'ed lisinopril as above.    Anemia of chronic disease:  --No bleeding. Hgb stable.  - Check H/H periodically - no need for chronic surveillance   Chronic low back pain, neuropathy, mood disorder:  - Continue lyrica, fentanyl patch, ativan and Cymbalta    Right toe ingrown nail -Does not appear to be infectious, continue cleaning per wound care: -Wash with soap and warm water, ten pain with betadine swabstick twice daily.  Procedures: None  Consultations: PCCM Gastroenterology  Discharge Exam: Vitals:   06/09/21 0900 06/09/21 1116  BP: (!) 112/39   Pulse: (!) 54   Resp: 16   Temp: 98.9 F (37.2 C)  98.8 F (37.1 C)  SpO2: 100%    Constitutional: NAD, calm, comfortable Respiratory: 6.0 XLT Shiley cuffed trach, PRVC mode, set rate 16, VT 550,  PEEP 5, PS 10, FiO2 28% , lungs clear to auscultation bilaterally. Cardiovascular: Regular rate and rhythm, no murmurs / rubs / gallops. No extremity edema.  Continues with intermittent hypotension mostly prominent at HS Abdomen: Left side PEG tube site unremarkable tube feedings infusing, bowel sounds positive.  Nontender to palpation. LBM 8/17 Neurological: cranial nerves II through XII are grossly intact.  Sensation only intact from the shoulders up.  Patient has functional quadriplegia without any purposeful movement. Psychiatric: Normal judgment and insight. Alert and oriented x 3. Normal mood.  Able to mouth words and convey thoughts and requests.   Discharge Instructions   Discharge Instructions     Ambulatory referral to Neurology   Complete by: As directed    An appointment is requested in approximately: 2-4 weeks for outpatient EMG      Allergies as of 06/09/2021       Reactions   Chlorhexidine Itching, Rash   Codeine Nausea Only   Oxycodone-acetaminophen Nausea Only   Other reaction(s): Hallucinations        Medication List     STOP taking these medications    acetaminophen 325 MG tablet Commonly known as: TYLENOL Replaced by: acetaminophen 160 MG/5ML solution   doxazosin 2 MG tablet Commonly known as: CARDURA   DULoxetine 30 MG capsule Commonly known as: CYMBALTA   ERYTHROMYCIN LACTOBIONATE IV   feeding supplement (PRO-STAT SUGAR FREE 64) Liqd   fentaNYL 50 MCG/HR Commonly known as: DURAGESIC Replaced by: fentaNYL 25 MCG/HR   gabapentin 300 MG capsule Commonly known as: NEURONTIN   lisinopril 5 MG tablet Commonly known as: ZESTRIL   Potassium Chloride 40 MEQ/15ML (20%) Soln   senna 8.6 MG Tabs tablet Commonly known as: SENOKOT Replaced by: sennosides 8.8 MG/5ML syrup       TAKE these medications    acetaminophen 160 MG/5ML solution Commonly known as: TYLENOL Place 30 mLs (960 mg total) into feeding tube every 8 (eight)  hours. Replaces: acetaminophen 325 MG tablet   bethanechol 10 MG tablet Commonly known as: URECHOLINE Place 1 tablet (10 mg total) into feeding tube 3 (three) times daily.   Carboxymethylcellulose Sodium 1 % Gel Place 1 drop into both eyes in the morning, at noon, and at bedtime.   chlorhexidine 0.12 % solution Commonly known as: PERIDEX Use as directed 5 mLs in the mouth or throat 2 (two) times daily.   Digestive Enzymes Tabs Take 1 tablet by mouth as needed (clogged tube).   feeding supplement (PROSource TF) liquid Place 45 mLs into feeding tube 3 (three) times daily. What changed:  how much to take when to take this additional instructions   feeding supplement (JEVITY 1.5 CAL/FIBER) Liqd Place 1,000 mLs into feeding tube continuous. What changed: You were already taking a medication with the same name, and this prescription was added. Make sure you understand how and when to take each.   fentaNYL 25 MCG/HR Commonly known as: DURAGESIC Place 1 patch onto the skin every 3 (three) days. Replaces: fentaNYL 50 MCG/HR   ferrous sulfate 325 (65 FE) MG tablet Take 325 mg by mouth See admin instructions. Every 2 days per g-tube   fiber Pack packet Place 1 packet into feeding tube every 12 (twelve) hours. What changed: Another medication with the same name was added. Make sure you understand how and when to  take each.   fiber Pack packet Place 1 packet into feeding tube 2 (two) times daily. What changed: You were already taking a medication with the same name, and this prescription was added. Make sure you understand how and when to take each.   glycopyrrolate 1 MG tablet Commonly known as: ROBINUL Place 1 mg into feeding tube 2 (two) times daily.   HYDROmorphone 2 MG tablet Commonly known as: DILAUDID Place 2 mg into feeding tube every 8 (eight) hours as needed for severe pain.   hydrOXYzine 25 MG tablet Commonly known as: ATARAX/VISTARIL Place 25 mg into feeding tube  every 6 (six) hours as needed for anxiety.   ipratropium-albuterol 0.5-2.5 (3) MG/3ML Soln Commonly known as: DUONEB Take 3 mLs by nebulization every 4 (four) hours as needed (respiratory failure).   lactulose (encephalopathy) 10 GM/15ML Soln Commonly known as: CHRONULAC Place 20 g into feeding tube 2 (two) times daily as needed (constipation).   lansoprazole 30 MG capsule Commonly known as: PREVACID Place 30 mg into feeding tube daily at 12 noon.   levocetirizine 5 MG tablet Commonly known as: XYZAL Place 5 mg into feeding tube at bedtime as needed for allergies.   LORazepam 0.5 MG tablet Commonly known as: ATIVAN Place 0.5 mg into feeding tube every 8 (eight) hours as needed for anxiety.   Melatonin 10 MG Tabs Give 10 mg by tube at bedtime.   midodrine 10 MG tablet Commonly known as: PROAMATINE Place 1 tablet (10 mg total) into feeding tube 3 (three) times daily with meals.   midodrine 10 MG tablet Commonly known as: PROAMATINE Take 1 tablet (10 mg total) by mouth at bedtime.   polyethylene glycol 17 g packet Commonly known as: MIRALAX / GLYCOLAX Place 17 g into feeding tube 2 (two) times daily. What changed: when to take this   pregabalin 100 MG capsule Commonly known as: LYRICA Place 1 capsule (100 mg total) into feeding tube 3 (three) times daily.   Saline Flush 0.9 % Soln Inject 10 mLs into the vein in the morning and at bedtime. Flush all unused lumens   sennosides 8.8 MG/5ML syrup Commonly known as: SENOKOT Place 10 mLs into feeding tube 2 (two) times daily. Replaces: senna 8.6 MG Tabs tablet       Allergies  Allergen Reactions   Chlorhexidine Itching and Rash   Codeine Nausea Only   Oxycodone-Acetaminophen Nausea Only    Other reaction(s): Hallucinations      The results of significant diagnostics from this hospitalization (including imaging, microbiology, ancillary and laboratory) are listed below for reference.    Significant Diagnostic  Studies: No results found.  Microbiology: No results found for this or any previous visit (from the past 240 hour(s)).   Labs: Basic Metabolic Panel: Recent Labs  Lab 06/03/21 0507 06/04/21 0555 06/05/21 0120 06/08/21 0443  NA 138  --  139 139  K 4.0  --  4.2 3.9  CL 102  --  107 105  CO2 27  --  25 26  GLUCOSE 119*  --  112* 120*  BUN 35*  --  37* 35*  CREATININE <0.30* <0.30* <0.30* <0.30*  CALCIUM 8.9  --  8.8* 9.1  MG  --   --  2.4 2.5*  PHOS  --   --   --  4.3   Liver Function Tests: Recent Labs  Lab 06/05/21 0120 06/08/21 0443  AST 13* 15  ALT 14 15  ALKPHOS 80 80  BILITOT 0.3 0.6  PROT 6.2* 6.5  ALBUMIN 2.7* 2.9*   No results for input(s): LIPASE, AMYLASE in the last 168 hours. No results for input(s): AMMONIA in the last 168 hours. CBC: Recent Labs  Lab 06/05/21 0120 06/08/21 0443  WBC 7.0 6.8  NEUTROABS 4.4  --   HGB 8.5* 9.4*  HCT 27.3* 29.4*  MCV 95.8 95.1  PLT 212 247   Cardiac Enzymes: No results for input(s): CKTOTAL, CKMB, CKMBINDEX, TROPONINI in the last 168 hours. BNP: BNP (last 3 results) No results for input(s): BNP in the last 8760 hours.  ProBNP (last 3 results) No results for input(s): PROBNP in the last 8760 hours.  CBG: No results for input(s): GLUCAP in the last 168 hours.     Signed:  Junious Silk ANP [facility] 06/09/2021, 12:50 PM

## 2021-06-22 ENCOUNTER — Encounter: Payer: Medicare HMO | Admitting: Neurology

## 2021-06-28 ENCOUNTER — Inpatient Hospital Stay (HOSPITAL_COMMUNITY)
Admission: EM | Admit: 2021-06-28 | Discharge: 2021-06-30 | DRG: 389 | Disposition: A | Discharge status: Skilled Nursing Facility | Payer: Medicare HMO | Source: Skilled Nursing Facility | Attending: Pulmonary Disease | Admitting: Pulmonary Disease

## 2021-06-28 ENCOUNTER — Encounter (HOSPITAL_COMMUNITY): Payer: Self-pay

## 2021-06-28 ENCOUNTER — Emergency Department (HOSPITAL_COMMUNITY): Payer: Medicare HMO

## 2021-06-28 DIAGNOSIS — G894 Chronic pain syndrome: Secondary | ICD-10-CM | POA: Diagnosis present

## 2021-06-28 DIAGNOSIS — I9589 Other hypotension: Secondary | ICD-10-CM | POA: Diagnosis present

## 2021-06-28 DIAGNOSIS — Z885 Allergy status to narcotic agent status: Secondary | ICD-10-CM

## 2021-06-28 DIAGNOSIS — K4090 Unilateral inguinal hernia, without obstruction or gangrene, not specified as recurrent: Secondary | ICD-10-CM | POA: Diagnosis present

## 2021-06-28 DIAGNOSIS — G7281 Critical illness myopathy: Secondary | ICD-10-CM | POA: Diagnosis present

## 2021-06-28 DIAGNOSIS — Z79899 Other long term (current) drug therapy: Secondary | ICD-10-CM | POA: Diagnosis not present

## 2021-06-28 DIAGNOSIS — J9610 Chronic respiratory failure, unspecified whether with hypoxia or hypercapnia: Secondary | ICD-10-CM | POA: Diagnosis present

## 2021-06-28 DIAGNOSIS — K567 Ileus, unspecified: Secondary | ICD-10-CM | POA: Diagnosis present

## 2021-06-28 DIAGNOSIS — Z888 Allergy status to other drugs, medicaments and biological substances status: Secondary | ICD-10-CM | POA: Diagnosis not present

## 2021-06-28 DIAGNOSIS — T40605A Adverse effect of unspecified narcotics, initial encounter: Secondary | ICD-10-CM | POA: Diagnosis present

## 2021-06-28 DIAGNOSIS — K5981 Ogilvie syndrome: Secondary | ICD-10-CM

## 2021-06-28 DIAGNOSIS — D649 Anemia, unspecified: Secondary | ICD-10-CM | POA: Diagnosis present

## 2021-06-28 DIAGNOSIS — Z20822 Contact with and (suspected) exposure to covid-19: Secondary | ICD-10-CM | POA: Diagnosis present

## 2021-06-28 DIAGNOSIS — Z87891 Personal history of nicotine dependence: Secondary | ICD-10-CM | POA: Diagnosis not present

## 2021-06-28 DIAGNOSIS — T443X5A Adverse effect of other parasympatholytics [anticholinergics and antimuscarinics] and spasmolytics, initial encounter: Secondary | ICD-10-CM | POA: Diagnosis present

## 2021-06-28 DIAGNOSIS — Z930 Tracheostomy status: Secondary | ICD-10-CM

## 2021-06-28 DIAGNOSIS — J9611 Chronic respiratory failure with hypoxia: Secondary | ICD-10-CM

## 2021-06-28 DIAGNOSIS — Z9911 Dependence on respirator [ventilator] status: Secondary | ICD-10-CM

## 2021-06-28 DIAGNOSIS — E440 Moderate protein-calorie malnutrition: Secondary | ICD-10-CM | POA: Insufficient documentation

## 2021-06-28 LAB — COMPREHENSIVE METABOLIC PANEL
ALT: 18 U/L (ref 0–44)
AST: 19 U/L (ref 15–41)
Albumin: 3.2 g/dL — ABNORMAL LOW (ref 3.5–5.0)
Alkaline Phosphatase: 88 U/L (ref 38–126)
Anion gap: 6 (ref 5–15)
BUN: 18 mg/dL (ref 8–23)
CO2: 25 mmol/L (ref 22–32)
Calcium: 8.9 mg/dL (ref 8.9–10.3)
Chloride: 106 mmol/L (ref 98–111)
Creatinine, Ser: <0.3 mg/dL — ABNORMAL LOW (ref 0.61–1.24)
Glucose, Bld: 88 mg/dL (ref 70–99)
Potassium: 4.4 mmol/L (ref 3.5–5.1)
Sodium: 137 mmol/L (ref 135–145)
Total Bilirubin: 0.4 mg/dL (ref 0.3–1.2)
Total Protein: 6.9 g/dL (ref 6.5–8.1)

## 2021-06-28 LAB — CBC WITH DIFFERENTIAL/PLATELET
Abs Immature Granulocytes: 0.03 10*3/uL (ref 0.00–0.07)
Basophils Absolute: 0 10*3/uL (ref 0.0–0.1)
Basophils Relative: 0 %
Eosinophils Absolute: 0.5 10*3/uL (ref 0.0–0.5)
Eosinophils Relative: 5 %
HCT: 35 % — ABNORMAL LOW (ref 39.0–52.0)
Hemoglobin: 11.1 g/dL — ABNORMAL LOW (ref 13.0–17.0)
Immature Granulocytes: 0 %
Lymphocytes Relative: 22 %
Lymphs Abs: 2.1 10*3/uL (ref 0.7–4.0)
MCH: 30.7 pg (ref 26.0–34.0)
MCHC: 31.7 g/dL (ref 30.0–36.0)
MCV: 96.7 fL (ref 80.0–100.0)
Monocytes Absolute: 0.8 10*3/uL (ref 0.1–1.0)
Monocytes Relative: 8 %
Neutro Abs: 5.9 10*3/uL (ref 1.7–7.7)
Neutrophils Relative %: 65 %
Platelets: 237 10*3/uL (ref 150–400)
RBC: 3.62 MIL/uL — ABNORMAL LOW (ref 4.22–5.81)
RDW: 15.9 % — ABNORMAL HIGH (ref 11.5–15.5)
WBC: 9.3 10*3/uL (ref 4.0–10.5)
nRBC: 0 % (ref 0.0–0.2)

## 2021-06-28 LAB — LACTIC ACID, PLASMA: Lactic Acid, Venous: 0.7 mmol/L (ref 0.5–1.9)

## 2021-06-28 LAB — I-STAT CHEM 8, ED
BUN: 18 mg/dL (ref 8–23)
Calcium, Ion: 1.13 mmol/L — ABNORMAL LOW (ref 1.15–1.40)
Chloride: 105 mmol/L (ref 98–111)
Creatinine, Ser: 0.2 mg/dL — ABNORMAL LOW (ref 0.61–1.24)
Glucose, Bld: 91 mg/dL (ref 70–99)
HCT: 33 % — ABNORMAL LOW (ref 39.0–52.0)
Hemoglobin: 11.2 g/dL — ABNORMAL LOW (ref 13.0–17.0)
Potassium: 4.4 mmol/L (ref 3.5–5.1)
Sodium: 140 mmol/L (ref 135–145)
TCO2: 32 mmol/L (ref 22–32)

## 2021-06-28 LAB — LIPASE, BLOOD: Lipase: 20 U/L (ref 11–51)

## 2021-06-28 MED ORDER — DOCUSATE SODIUM 100 MG PO CAPS: 100.0000 mg | ORAL_CAPSULE | Freq: Two times a day (BID) | ORAL | Status: DC | PRN

## 2021-06-28 MED ORDER — FENTANYL 25 MCG/HR TD PT72
1.0000 | MEDICATED_PATCH | TRANSDERMAL | Status: DC
  Administered 2021-06-29: 1 via TRANSDERMAL
  Filled 2021-06-28: qty 1

## 2021-06-28 MED ORDER — ENOXAPARIN SODIUM 40 MG/0.4ML IJ SOSY
40.0000 mg | PREFILLED_SYRINGE | Freq: Every day | INTRAMUSCULAR | Status: DC
  Administered 2021-06-29 (×2): 40 mg via SUBCUTANEOUS
  Filled 2021-06-28 (×2): qty 0.4

## 2021-06-28 MED ORDER — PANTOPRAZOLE SODIUM 40 MG IV SOLR
40.0000 mg | Freq: Every day | INTRAVENOUS | Status: DC
  Administered 2021-06-29 (×2): 40 mg via INTRAVENOUS
  Filled 2021-06-28 (×2): qty 40

## 2021-06-28 MED ORDER — PEG-KCL-NACL-NASULF-NA ASC-C 100 G PO SOLR
1.0000 | Freq: Once | ORAL | Status: AC
  Administered 2021-06-29: 200 g
  Filled 2021-06-28: qty 1

## 2021-06-28 MED ORDER — IOHEXOL 350 MG/ML SOLN
85.0000 mL | Freq: Once | INTRAVENOUS | Status: AC | PRN
  Administered 2021-06-28: 85 mL via INTRAVENOUS

## 2021-06-28 MED ORDER — POLYETHYLENE GLYCOL 3350 17 G PO PACK: 17.0000 g | PACK | Freq: Every day | ORAL | Status: DC | PRN

## 2021-06-28 NOTE — ED Provider Notes (Signed)
[facility] EMERGENCY DEPARTMENT Provider Note   CSN: [id number] Arrival date & time: 06/28/21  2033     History Chief Complaint  Patient presents with   Constipation    Andrew Rivera is a 70 y.o. male.  70 yo M with a cc of worsening abdominal distention and pain and no bowel movement.  Going on for a few days now.  Had a CT scan done at his LTAC and there was concern for possible obstruction.  He was sent here then for evaluation.  The history is provided by the patient, the nursing home and medical records.  Constipation Severity:  Moderate Time since last bowel movement:  1 week Timing:  Constant Progression:  Worsening Chronicity:  Recurrent Relieved by:  Nothing Worsened by:  Nothing Ineffective treatments:  None tried Associated symptoms: abdominal pain   Associated symptoms: no diarrhea, no fever and no vomiting       Past Medical History:  Diagnosis Date   Acute on chronic respiratory failure with hypoxia (HCC)    Altered mental status, unspecified    Chronic pain syndrome    Critical illness myopathy    Internal jugular (IJ) vein thromboembolism, acute, unspecified laterality (HCC)    Septic shock (HCC)     Patient Active Problem List   Diagnosis Date Noted   Dysautonomia ([place])    Ventilator dependence ([place])    Tracheostomy dependence ([place])    Chronic respiratory failure with hypoxia (HCC)    Abdominal distension    Constipation    Hypokalemia    Ileus ([place]) 04/24/2021   Pressure injury of skin 01/28/2020   Acute hypoxemic respiratory failure ([place]) 01/26/2020   Acute on chronic respiratory failure with hypoxia (HCC)    Septic shock (HCC)    Altered mental status, unspecified    Critical illness myopathy    Chronic pain syndrome    Internal jugular (IJ) vein thromboembolism, acute, unspecified laterality ([place])     History reviewed. No pertinent surgical history.     No family history on file.  Social History   Tobacco  Use   Smoking status: Former    Packs/day: 1.00    Types: Cigarettes    Quit date: 01/25/1990    Years since quitting: 31.4   Smokeless tobacco: Never  Substance Use Topics   Alcohol use: Not Currently   Drug use: Never    Home Medications Prior to Admission medications   Medication Sig Start Date End Date Taking? Authorizing Provider  acetaminophen (TYLENOL) 160 MG/5ML solution Place 30 mLs (960 mg total) into feeding tube every 8 (eight) hours. 05/05/21   Mercy Riding, MD  bethanechol (URECHOLINE) 10 MG tablet Place 1 tablet (10 mg total) into feeding tube 3 (three) times daily. 06/09/21   Samella Parr, NP  Carboxymethylcellulose Sodium 1 % GEL Place 1 drop into both eyes in the morning, at noon, and at bedtime.    [provider]  chlorhexidine (PERIDEX) 0.12 % solution Use as directed 5 mLs in the mouth or throat 2 (two) times daily.    [provider]  Digestive Enzymes TABS Take 1 tablet by mouth as needed (clogged tube).    [provider]  fentaNYL (DURAGESIC) 25 MCG/HR Place 1 patch onto the skin every 3 (three) days. 06/09/21   Samella Parr, NP  ferrous sulfate 325 (65 FE) MG tablet Take 325 mg by mouth See admin instructions. Every 2 days per g-tube    [provider]  fiber (NUTRISOURCE FIBER) PACK packet Place 1 packet into feeding tube 2 (two) times daily. 06/09/21   Samella Parr, NP  glycopyrrolate (ROBINUL) 1 MG tablet Place 1 mg into feeding tube 2 (two) times daily.    [provider]  Guar Gum (NUTRISOURCE FIBER) PACK Place 1 packet into feeding tube every 12 (twelve) hours.    [provider]  HYDROmorphone (DILAUDID) 2 MG tablet Place 1 tablet (2 mg total) into feeding tube every 8 (eight) hours as needed for severe pain. 06/09/21   Samella Parr, NP  hydrOXYzine (ATARAX/VISTARIL) 25 MG tablet Place 25 mg into feeding tube every 6 (six) hours as needed for anxiety.    [provider]   ipratropium-albuterol (DUONEB) 0.5-2.5 (3) MG/3ML SOLN Take 3 mLs by nebulization every 4 (four) hours as needed (respiratory failure).    [provider]  lactulose, encephalopathy, (CHRONULAC) 10 GM/15ML SOLN Place 20 g into feeding tube 2 (two) times daily as needed (constipation).    [provider]  lansoprazole (PREVACID) 30 MG capsule Place 30 mg into feeding tube daily at 12 noon.    [provider]  levocetirizine (XYZAL) 5 MG tablet Place 5 mg into feeding tube at bedtime as needed for allergies.    [provider]  LORazepam (ATIVAN) 0.5 MG tablet Place 1 tablet (0.5 mg total) into feeding tube every 8 (eight) hours as needed for anxiety. 06/09/21   Samella Parr, NP  Melatonin 10 MG TABS Give 10 mg by tube at bedtime.    [provider]  midodrine (PROAMATINE) 10 MG tablet Place 1 tablet (10 mg total) into feeding tube 3 (three) times daily with meals. 06/09/21   Samella Parr, NP  midodrine (PROAMATINE) 10 MG tablet Take 1 tablet (10 mg total) by mouth at bedtime. 06/09/21   Samella Parr, NP  Nutritional Supplements (FEEDING SUPPLEMENT, JEVITY 1.5 CAL/FIBER,) LIQD Place 1,000 mLs into feeding tube continuous. 06/09/21   Samella Parr, NP  Nutritional Supplements (FEEDING SUPPLEMENT, PROSOURCE TF,) liquid Place 45 mLs into feeding tube 3 (three) times daily. 06/09/21   Samella Parr, NP  polyethylene glycol (MIRALAX / GLYCOLAX) 17 g packet Place 17 g into feeding tube 2 (two) times daily. 06/09/21   Samella Parr, NP  pregabalin (LYRICA) 100 MG capsule Place 1 capsule (100 mg total) into feeding tube 3 (three) times daily. 06/09/21   Samella Parr, NP  sennosides (SENOKOT) 8.8 MG/5ML syrup Place 10 mLs into feeding tube 2 (two) times daily. 06/09/21   Samella Parr, NP  Sodium Chloride Flush (SALINE FLUSH) 0.9 % SOLN Inject 10 mLs into the vein in the morning and at bedtime. Flush all unused lumens    [provider]   sertraline (ZOLOFT) 50 MG tablet Place 50 mg into feeding tube at bedtime.  04/28/21  [provider]    Allergies    Chlorhexidine, Codeine, and Oxycodone-acetaminophen  Review of Systems   Review of Systems  Constitutional:  Negative for chills and fever.  HENT:  Negative for congestion and facial swelling.   Eyes:  Negative for discharge and visual disturbance.  Respiratory:  Negative for shortness of breath.   Cardiovascular:  Negative for chest pain and palpitations.  Gastrointestinal:  Positive for abdominal pain and constipation. Negative for diarrhea and vomiting.  Musculoskeletal:  Negative for arthralgias and myalgias.  Skin:  Negative for color change and rash.  Neurological:  Negative for  tremors, syncope and headaches.  Psychiatric/Behavioral:  Negative for confusion and dysphoric mood.    Physical Exam Updated Vital Signs BP (!) 136/50   Pulse 65   Temp 98.3 F (36.8 C) (Temporal)   Resp 14   SpO2 96%   Physical Exam Vitals and nursing note reviewed.  Constitutional:      Appearance: He is well-developed.  HENT:     Head: Normocephalic and atraumatic.  Eyes:     Pupils: Pupils are equal, round, and reactive to light.  Neck:     Vascular: No JVD.  Cardiovascular:     Rate and Rhythm: Normal rate and regular rhythm.     Heart sounds: No murmur heard.   No friction rub. No gallop.  Pulmonary:     Effort: No respiratory distress.     Breath sounds: No wheezing.  Abdominal:     General: There is distension.     Tenderness: There is no abdominal tenderness. There is no guarding or rebound.     Comments: Distended abdomen, tympanitic to percussion.  No obvious tenderness.  Musculoskeletal:        General: Normal range of motion.     Cervical back: Normal range of motion and neck supple.  Skin:    Coloration: Skin is not pale.     Findings: No rash.  Neurological:     Mental Status: He is alert and oriented to person, place, and time.   Psychiatric:        Behavior: Behavior normal.    ED Results / Procedures / Treatments   Labs (all labs ordered are listed, but only abnormal results are displayed) Labs Reviewed  CBC WITH DIFFERENTIAL/PLATELET  COMPREHENSIVE METABOLIC PANEL  LIPASE, BLOOD  LACTIC ACID, PLASMA  LACTIC ACID, PLASMA  URINALYSIS, ROUTINE W REFLEX MICROSCOPIC  I-STAT CHEM 8, ED    EKG None  Radiology No results found.  Procedures Procedures   Medications Ordered in ED Medications - No data to display  ED Course  I have reviewed the triage vital signs and the nursing notes.  Pertinent labs & imaging results that were available during my care of the patient were reviewed by me and considered in my medical decision making (see chart for details).    MDM Rules/Calculators/A&P                           70 yo M with a chief complaints of abdominal distention.  Going on for the past week or so.  Patient unfortunately has a history of M SSA bacteremia and prolonged ventilator support requiring chronic trach.  Currently is a resident at [facility].  Has had issues with moving his bowels in the past and has had an admission to the hospital previously for similar presentation.  He had a CT scan done at a mobile CT unit that I am unable to see the images.  The radiology read is concerned for possible ileus.  As I am unable to see the images we will repeat a CT scan here.  CT with ileus.  Discussed with Dr. Tarri Glenn who recommended overnight observation and movie prep cleanout.  I discussed case with critical care as the patient is actively ventilated.  Will admit.  CRITICAL CARE Performed by: Cecilio Asper   Total critical care time: 35 minutes  Critical care time was exclusive of separately billable procedures and treating other patients.  Critical care was necessary to treat or prevent  imminent or life-threatening deterioration.  Critical care was time spent personally by me on the  following activities: development of treatment plan with patient and/or surrogate as well as nursing, discussions with consultants, evaluation of patient's response to treatment, examination of patient, obtaining history from patient or surrogate, ordering and performing treatments and interventions, ordering and review of laboratory studies, ordering and review of radiographic studies, pulse oximetry and re-evaluation of patient's condition.  The patients results and plan were reviewed and discussed.   Any x-rays performed were independently reviewed by myself.   Differential diagnosis were considered with the presenting HPI.  Medications  peg 3350 powder (MOVIPREP) kit 200 g (has no administration in time range)  iohexol (OMNIPAQUE) 350 MG/ML injection 85 mL (85 mLs Intravenous Contrast Given 06/28/21 2245)    Vitals:   06/28/21 2130 06/28/21 2145 06/28/21 2241 06/28/21 2300  BP: (!) 108/49 (!) 113/49  (!) 116/48  Pulse: 65 60  62  Resp: _0 Temp:      TempSrc:      SpO2: 95% 95% 100% 97%    Final diagnoses:  None      Final Clinical Impression(s) / ED Diagnoses Final diagnoses:  None    Rx / DC Orders ED Discharge Orders     None        Deno Etienne, DO 06/28/21 2342

## 2021-06-28 NOTE — ED Triage Notes (Signed)
Pt comes via [facility] from [facility], hx of fecal impaction and possible ileus show on CT scan.

## 2021-06-28 NOTE — H&P (Signed)
NAME:  Andrew Rivera, MRN:  [id number], DOB:  1951-04-30, LOS: 0 ADMISSION DATE:  06/28/2021, CONSULTATION DATE:  06/29/21 REFERRING MD:  EDP, CHIEF COMPLAINT:  ileus   History of Present Illness:  Andrew Rivera is a 70 y.o. M from [facility] with PMH significant for chronic respiratory failure secondary to multiple prior back surgeries and MSSA bacteria that eventually required trach/peg, chronic pain and constipation who presents from with concern for possible ileus vs obstruction.   Per notes, pt had not had a BM for several days and was experiencing worsening distension and had imaging concerning for obstruction, so was sent to the ED.   CT abd/pelvis in the ED with diffuse colonic distension concerinng for ileus vs. Ogilvie syndrome.   Bloodwork did not show leukocytosis or lactic acidosis and were otherwise non-actionable.   Pertinent  Medical History   has a past medical history of Acute on chronic respiratory failure with hypoxia (HCC), Altered mental status, unspecified, Chronic pain syndrome, Critical illness myopathy, Internal jugular (IJ) vein thromboembolism, acute, unspecified laterality (HCC), and Septic shock (HCC).   Significant Hospital Events: Including procedures, antibiotic start and stop dates in addition to other pertinent events   9/7 Admit to PCCM for ileus  Interim History / Subjective:  As above  Objective   Blood pressure (!) 116/48, pulse 62, temperature 98.3 F (36.8 C), temperature source Temporal, resp. rate 16, SpO2 97 %.    Vent Mode: PRVC FiO2 (%):  [28 %] 28 % Set Rate:  [16 bmp] 16 bmp Vt Set:  [560 mL] 560 mL PEEP:  [5 cmH20] 5 cmH20 Plateau Pressure:  [20 cmH20] 20 cmH20  No intake or output data in the 24 hours ending 06/28/21 2357 There were no vitals filed for this visit.  General:  chronically ill-appearing M mildly uncomfortable appearing, awake  HEENT: MM pink/moist Neuro: nod's appropriately to questions and is alert, moving all  extremities  CV: s1s2 rrr, no m/r/g PULM:  bronchial breath sounds bilaterally, trach in place on vent support GI: significantly distended with hyperactive BS, pt does not appear to have pain on palpation or peritoneal signs Extremities: warm/dry, LLE with 1+ edema  Skin: no rashes or lesions   Resolved Hospital Problem list     Assessment & Plan:   Severe, acute, constipation and Ileus  History of similar episode several months ago that resolved with medical managament P: -GI has been consulted, have ordered bowel prep -NPO -IVF     Chronic respiratory failure requiring tracheostomy with ventilator dependence Admit to ICU for vent management On minimal vent settings  P: --Maintain full vent support  -titrate Vent setting to maintain SpO2 greater than or equal to 90%. -HOB elevated 30 degrees. -Plateau pressures less than 30 cm H20.  -Follow chest x-ray, ABG prn.   -Bronchial hygiene and RT/bronchodilator protocol.    Chronic pain  -continue fentanyl patch -hold oral dilaudid, ativan,    Chronic Hypotension -hold Midodrine while NPO, monitor    Nutrition -hold TF in the setting of ileus and bowel prep overnight -IVF  Best Practice (right click and "Reselect all SmartList Selections" daily)   Diet/type: NPO DVT prophylaxis: LMWH GI prophylaxis: PPI Lines: N/A Foley:  N/A Code Status:  full code Last date of multidisciplinary goals of care discussion []   Labs   CBC: Recent Labs  Lab 06/28/21 2049 06/28/21 2117  WBC 9.3  --   NEUTROABS 5.9  --   HGB 11.1* 11.2*  HCT 35.0*  33.0*  MCV 96.7  --   PLT 237  --     Basic Metabolic Panel: Recent Labs  Lab 06/28/21 2049 06/28/21 2117  NA 137 140  K 4.4 4.4  CL 106 105  CO2 25  --   GLUCOSE 88 91  BUN 18 18  CREATININE <0.30* 0.20*  CALCIUM 8.9  --    GFR: CrCl cannot be calculated (Unknown ideal weight.). Recent Labs  Lab 06/28/21 2049 06/28/21 2057  WBC 9.3  --   LATICACIDVEN  --   0.7    Liver Function Tests: Recent Labs  Lab 06/28/21 2049  AST 19  ALT 18  ALKPHOS 88  BILITOT 0.4  PROT 6.9  ALBUMIN 3.2*   Recent Labs  Lab 06/28/21 2049  LIPASE 20   No results for input(s): AMMONIA in the last 168 hours.  ABG    Component Value Date/Time   TCO2 32 06/28/2021 2117     Coagulation Profile: No results for input(s): INR, PROTIME in the last 168 hours.  Cardiac Enzymes: No results for input(s): CKTOTAL, CKMB, CKMBINDEX, TROPONINI in the last 168 hours.  HbA1C: No results found for: HGBA1C  CBG: No results for input(s): GLUCAP in the last 168 hours.  Review of Systems:   Unable to obtain secondary to trach  Past Medical History:  He,  has a past medical history of Acute on chronic respiratory failure with hypoxia (HCC), Altered mental status, unspecified, Chronic pain syndrome, Critical illness myopathy, Internal jugular (IJ) vein thromboembolism, acute, unspecified laterality (HCC), and Septic shock (HCC).   Surgical History:  History reviewed. No pertinent surgical history.   Social History:   reports that he quit smoking about 31 years ago. His smoking use included cigarettes. He smoked an average of 1 pack per day. He has never used smokeless tobacco. He reports that he does not currently use alcohol. He reports that he does not use drugs.   Family History:  His family history is not on file.   Allergies Allergies  Allergen Reactions   Chlorhexidine Itching and Rash   Codeine Nausea Only   Oxycodone-Acetaminophen Nausea Only    Other reaction(s): Hallucinations     Home Medications  Prior to Admission medications   Medication Sig Start Date End Date Taking? Authorizing Provider  acetaminophen (TYLENOL) 160 MG/5ML solution Place 30 mLs (960 mg total) into feeding tube every 8 (eight) hours. 05/05/21   Almon Hercules, MD  bethanechol (URECHOLINE) 10 MG tablet Place 1 tablet (10 mg total) into feeding tube 3 (three) times daily.  06/09/21   Russella Dar, NP  Carboxymethylcellulose Sodium 1 % GEL Place 1 drop into both eyes in the morning, at noon, and at bedtime.    [provider]  chlorhexidine (PERIDEX) 0.12 % solution Use as directed 5 mLs in the mouth or throat 2 (two) times daily.    [provider]  Digestive Enzymes TABS Take 1 tablet by mouth as needed (clogged tube).    [provider]  fentaNYL (DURAGESIC) 25 MCG/HR Place 1 patch onto the skin every 3 (three) days. 06/09/21   Russella Dar, NP  ferrous sulfate 325 (65 FE) MG tablet Take 325 mg by mouth See admin instructions. Every 2 days per g-tube    [provider]  fiber (NUTRISOURCE FIBER) PACK packet Place 1 packet into feeding tube 2 (two) times daily. 06/09/21   Russella Dar, NP  glycopyrrolate (ROBINUL) 1 MG tablet Place 1 mg  into feeding tube 2 (two) times daily.    [provider]  Guar Gum (NUTRISOURCE FIBER) PACK Place 1 packet into feeding tube every 12 (twelve) hours.    [provider]  HYDROmorphone (DILAUDID) 2 MG tablet Place 1 tablet (2 mg total) into feeding tube every 8 (eight) hours as needed for severe pain. 06/09/21   Russella Dar, NP  hydrOXYzine (ATARAX/VISTARIL) 25 MG tablet Place 25 mg into feeding tube every 6 (six) hours as needed for anxiety.    [provider]  ipratropium-albuterol (DUONEB) 0.5-2.5 (3) MG/3ML SOLN Take 3 mLs by nebulization every 4 (four) hours as needed (respiratory failure).    [provider]  lactulose, encephalopathy, (CHRONULAC) 10 GM/15ML SOLN Place 20 g into feeding tube 2 (two) times daily as needed (constipation).    [provider]  lansoprazole (PREVACID) 30 MG capsule Place 30 mg into feeding tube daily at 12 noon.    [provider]  levocetirizine (XYZAL) 5 MG tablet Place 5 mg into feeding tube at bedtime as needed for allergies.    [provider]  LORazepam (ATIVAN) 0.5 MG tablet Place 1  tablet (0.5 mg total) into feeding tube every 8 (eight) hours as needed for anxiety. 06/09/21   Russella Dar, NP  Melatonin 10 MG TABS Give 10 mg by tube at bedtime.    [provider]  midodrine (PROAMATINE) 10 MG tablet Place 1 tablet (10 mg total) into feeding tube 3 (three) times daily with meals. 06/09/21   Russella Dar, NP  midodrine (PROAMATINE) 10 MG tablet Take 1 tablet (10 mg total) by mouth at bedtime. 06/09/21   Russella Dar, NP  Nutritional Supplements (FEEDING SUPPLEMENT, JEVITY 1.5 CAL/FIBER,) LIQD Place 1,000 mLs into feeding tube continuous. 06/09/21   Russella Dar, NP  Nutritional Supplements (FEEDING SUPPLEMENT, PROSOURCE TF,) liquid Place 45 mLs into feeding tube 3 (three) times daily. 06/09/21   Russella Dar, NP  polyethylene glycol (MIRALAX / GLYCOLAX) 17 g packet Place 17 g into feeding tube 2 (two) times daily. 06/09/21   Russella Dar, NP  pregabalin (LYRICA) 100 MG capsule Place 1 capsule (100 mg total) into feeding tube 3 (three) times daily. 06/09/21   Russella Dar, NP  sennosides (SENOKOT) 8.8 MG/5ML syrup Place 10 mLs into feeding tube 2 (two) times daily. 06/09/21   Russella Dar, NP  Sodium Chloride Flush (SALINE FLUSH) 0.9 % SOLN Inject 10 mLs into the vein in the morning and at bedtime. Flush all unused lumens    [provider]  sertraline (ZOLOFT) 50 MG tablet Place 50 mg into feeding tube at bedtime.  04/28/21  [provider]     Critical care time:  35 minutes     CRITICAL CARE Performed by: Darcella Gasman Tahjai Schetter   Total critical care time: 35 minutes  Critical care time was exclusive of separately billable procedures and treating other patients.  Critical care was necessary to treat or prevent imminent or life-threatening deterioration.  Critical care was time spent personally by me on the following activities: development of treatment plan with patient and/or surrogate as well as nursing, discussions with  consultants, evaluation of patient's response to treatment, examination of patient, obtaining history from patient or surrogate, ordering and performing treatments and interventions, ordering and review of laboratory studies, ordering and review of radiographic studies, pulse oximetry and re-evaluation of patient's condition.  Darcella Gasman Thaddaeus Granja, PA-C [facility] See  Amion for pager If no response to pager , please call [phone] until 7pm After 7:00 pm call Elink  [phone]

## 2021-06-28 NOTE — Progress Notes (Signed)
Pt transported to ct and back to [facility] w/o complications. RT will cont to monitor.

## 2021-06-29 ENCOUNTER — Inpatient Hospital Stay (HOSPITAL_COMMUNITY): Payer: Medicare HMO

## 2021-06-29 DIAGNOSIS — Z930 Tracheostomy status: Secondary | ICD-10-CM | POA: Diagnosis not present

## 2021-06-29 DIAGNOSIS — J9610 Chronic respiratory failure, unspecified whether with hypoxia or hypercapnia: Secondary | ICD-10-CM | POA: Diagnosis not present

## 2021-06-29 DIAGNOSIS — K4090 Unilateral inguinal hernia, without obstruction or gangrene, not specified as recurrent: Secondary | ICD-10-CM

## 2021-06-29 DIAGNOSIS — K567 Ileus, unspecified: Principal | ICD-10-CM

## 2021-06-29 DIAGNOSIS — Z9911 Dependence on respirator [ventilator] status: Secondary | ICD-10-CM

## 2021-06-29 LAB — RESP PANEL BY RT-PCR (FLU A&B, COVID) ARPGX2
Influenza A by PCR: NEGATIVE
Influenza B by PCR: NEGATIVE
SARS Coronavirus 2 by RT PCR: NEGATIVE

## 2021-06-29 LAB — MAGNESIUM: Magnesium: 2.5 mg/dL — ABNORMAL HIGH (ref 1.7–2.4)

## 2021-06-29 LAB — GLUCOSE, CAPILLARY
Glucose-Capillary: 105 mg/dL — ABNORMAL HIGH (ref 70–99)
Glucose-Capillary: 131 mg/dL — ABNORMAL HIGH (ref 70–99)
Glucose-Capillary: 73 mg/dL (ref 70–99)
Glucose-Capillary: 80 mg/dL (ref 70–99)
Glucose-Capillary: 82 mg/dL (ref 70–99)
Glucose-Capillary: 86 mg/dL (ref 70–99)
Glucose-Capillary: 89 mg/dL (ref 70–99)
Glucose-Capillary: 90 mg/dL (ref 70–99)

## 2021-06-29 LAB — BASIC METABOLIC PANEL
Anion gap: 9 (ref 5–15)
BUN: 16 mg/dL (ref 8–23)
CO2: 22 mmol/L (ref 22–32)
Calcium: 9 mg/dL (ref 8.9–10.3)
Chloride: 107 mmol/L (ref 98–111)
Creatinine, Ser: <0.3 mg/dL — ABNORMAL LOW (ref 0.61–1.24)
Glucose, Bld: 78 mg/dL (ref 70–99)
Potassium: 4.2 mmol/L (ref 3.5–5.1)
Sodium: 138 mmol/L (ref 135–145)

## 2021-06-29 LAB — MRSA NEXT GEN BY PCR, NASAL: MRSA by PCR Next Gen: DETECTED — AB

## 2021-06-29 LAB — CBC
HCT: 35.3 % — ABNORMAL LOW (ref 39.0–52.0)
Hemoglobin: 11.3 g/dL — ABNORMAL LOW (ref 13.0–17.0)
MCH: 30.3 pg (ref 26.0–34.0)
MCHC: 32 g/dL (ref 30.0–36.0)
MCV: 94.6 fL (ref 80.0–100.0)
Platelets: 193 10*3/uL (ref 150–400)
RBC: 3.73 MIL/uL — ABNORMAL LOW (ref 4.22–5.81)
RDW: 15.9 % — ABNORMAL HIGH (ref 11.5–15.5)
WBC: 9 10*3/uL (ref 4.0–10.5)
nRBC: 0 % (ref 0.0–0.2)

## 2021-06-29 LAB — LACTIC ACID, PLASMA: Lactic Acid, Venous: 0.7 mmol/L (ref 0.5–1.9)

## 2021-06-29 MED ORDER — JEVITY 1.5 CAL/FIBER PO LIQD
1000.0000 mL | ORAL | Status: DC
  Administered 2021-06-29 – 2021-06-30 (×2): 1000 mL
  Filled 2021-06-29 (×3): qty 1000

## 2021-06-29 MED ORDER — BETHANECHOL CHLORIDE 10 MG PO TABS
10.0000 mg | ORAL_TABLET | Freq: Three times a day (TID) | ORAL | Status: DC
  Administered 2021-06-29 – 2021-06-30 (×5): 10 mg
  Filled 2021-06-29 (×6): qty 1

## 2021-06-29 MED ORDER — PROSOURCE TF PO LIQD
45.0000 mL | Freq: Three times a day (TID) | ORAL | Status: DC
  Administered 2021-06-29 – 2021-06-30 (×5): 45 mL
  Filled 2021-06-29 (×3): qty 45

## 2021-06-29 MED ORDER — SENNOSIDES 8.8 MG/5ML PO SYRP
10.0000 mL | ORAL_SOLUTION | Freq: Two times a day (BID) | ORAL | Status: DC
  Administered 2021-06-30: 10 mL
  Filled 2021-06-29: qty 10

## 2021-06-29 MED ORDER — ORAL CARE MOUTH RINSE
15.0000 mL | OROMUCOSAL | Status: DC
  Administered 2021-06-29 – 2021-06-30 (×15): 15 mL via OROMUCOSAL

## 2021-06-29 MED ORDER — NUTRISOURCE FIBER PO PACK
1.0000 | PACK | Freq: Two times a day (BID) | ORAL | Status: DC
  Administered 2021-06-30: 1
  Filled 2021-06-29 (×2): qty 1

## 2021-06-29 MED ORDER — MIDODRINE HCL 5 MG PO TABS
10.0000 mg | ORAL_TABLET | Freq: Three times a day (TID) | ORAL | Status: DC
  Administered 2021-06-29 – 2021-06-30 (×5): 10 mg
  Filled 2021-06-29 (×4): qty 2

## 2021-06-29 MED ORDER — HYDROMORPHONE HCL 2 MG PO TABS
1.0000 mg | ORAL_TABLET | Freq: Three times a day (TID) | ORAL | Status: DC | PRN
Start: 2021-06-29 — End: 2021-06-30
  Filled 2021-06-29: qty 1

## 2021-06-29 MED ORDER — LORATADINE 10 MG PO TABS
10.0000 mg | ORAL_TABLET | Freq: Every day | ORAL | Status: DC
  Administered 2021-06-29 – 2021-06-30 (×2): 10 mg
  Filled 2021-06-29 (×2): qty 1

## 2021-06-29 MED ORDER — HYDROXYZINE HCL 25 MG PO TABS
25.0000 mg | ORAL_TABLET | Freq: Four times a day (QID) | ORAL | Status: DC | PRN
  Administered 2021-06-29 – 2021-06-30 (×3): 25 mg
  Filled 2021-06-29 (×3): qty 1

## 2021-06-29 MED ORDER — PREGABALIN 50 MG PO CAPS
100.0000 mg | ORAL_CAPSULE | Freq: Three times a day (TID) | ORAL | Status: DC
  Administered 2021-06-29 – 2021-06-30 (×5): 100 mg
  Filled 2021-06-29 (×2): qty 4
  Filled 2021-06-29 (×2): qty 2

## 2021-06-29 MED ORDER — POLYETHYLENE GLYCOL 3350 17 G PO PACK
17.0000 g | PACK | Freq: Two times a day (BID) | ORAL | Status: DC
  Administered 2021-06-30: 17 g
  Filled 2021-06-29: qty 1

## 2021-06-29 MED ORDER — FREE WATER
200.0000 mL | Status: DC
  Administered 2021-06-29 – 2021-06-30 (×7): 200 mL

## 2021-06-29 MED ORDER — MUPIROCIN 2 % EX OINT
1.0000 "application " | TOPICAL_OINTMENT | Freq: Two times a day (BID) | CUTANEOUS | Status: DC
  Administered 2021-06-29 – 2021-06-30 (×3): 1 via NASAL
  Filled 2021-06-29: qty 22

## 2021-06-29 MED ORDER — LORAZEPAM 0.5 MG PO TABS: 0.5000 mg | ORAL_TABLET | Freq: Three times a day (TID) | ORAL | Status: DC | PRN

## 2021-06-29 MED ORDER — ACETAMINOPHEN 500 MG PO TABS
1000.0000 mg | ORAL_TABLET | Freq: Three times a day (TID) | ORAL | Status: DC
  Administered 2021-06-29 – 2021-06-30 (×5): 1000 mg
  Filled 2021-06-29 (×5): qty 2

## 2021-06-29 MED ORDER — DEXTROSE IN LACTATED RINGERS 5 % IV SOLN: INTRAVENOUS | Status: AC

## 2021-06-29 MED ORDER — MELATONIN 5 MG PO TABS
10.0000 mg | ORAL_TABLET | Freq: Every day | ORAL | Status: DC
  Administered 2021-06-29: 10 mg
  Filled 2021-06-29: qty 2

## 2021-06-29 MED ORDER — LACTULOSE 10 GM/15ML PO SOLN: 20.0000 g | Freq: Two times a day (BID) | ORAL | Status: DC | PRN

## 2021-06-29 MED ORDER — LACTATED RINGERS IV BOLUS
500.0000 mL | Freq: Once | INTRAVENOUS | Status: AC
  Administered 2021-06-29: 500 mL via INTRAVENOUS

## 2021-06-29 NOTE — Progress Notes (Signed)
NAME:  Andrew Rivera, MRN:  [id number], DOB:  Jan 26, 1951, LOS: 1 ADMISSION DATE:  06/28/2021, CONSULTATION DATE:  9/7 REFERRING MD:  EDP, CHIEF COMPLAINT:  Ileus   History of Present Illness:  70 y/o male admitted from [facility] on 9/7 in the setting of ileus vs SBO who is ventilator dependent due to complications from multiple prior back surgeries and MSSA bacteremia.   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  No obstruction or volvulus.  Small bowel obstruction. Gastrostomy tube in the stomach, balloon appropriately positioned.  Fat containing left inguinal  Micro 9/6 sars cov 2 flu negative   Interim History / Subjective:  Admitted from [facility] overnight Hasn't had a bowel movement since Sunday Remains mechanically ventilated Doesn't have abdominal pain  Objective   Blood pressure (!) 132/44, pulse 71, temperature 98.4 F (36.9 C), temperature source Oral, resp. rate 16, weight 84.3 kg, SpO2 97 %.    Vent Mode: PRVC FiO2 (%):  [28 %] 28 % Set Rate:  [16 bmp] 16 bmp Vt Set:  [550 mL-560 mL] 550 mL PEEP:  [5 cmH20] 5 cmH20 Plateau Pressure:  [20 cmH20] 20 cmH20   Intake/Output Summary (Last 24 hours) at 06/29/2021 7829 Last data filed at 06/29/2021 0700 Gross per 24 hour  Intake 539.38 ml  Output 2000 ml  Net -1460.62 ml   Filed Weights   06/29/21 0130  Weight: 84.3 kg    Examination:  General:  In bed on vent HENT: NCAT tracheostomy in place PULM: CTA B, vent supported breathing CV: RRR, no mgr GI: hyperactive bowel sounds, distended slightly but soft, nontender MSK: diminished bulk and tone Neuro: awake, alert on  vent   Resolved Hospital Problem list     Assessment & Plan:  Severe acute constipation and ileus : suspect due to narcotic use and robinul use, resolved overnight with bowel prep Stop robinul Stop prn dilaudid Continue low dose fentanyl patch Start free water flushes Start tube feeding per home regimen Restart home bowel regimen: Miralax, senna, lactulose, fiber tomorrow Stop oral iron  Chronic respiratory failure requiring tracheostomy with ventilator dependence: will not wean from vent Full mechanical vent support VAP prevention Daily WUA/SBT  Chronic pain Restart fentanyl patch Hold prn dilaudid, if needs prn pain medication use half dose of his dilaudid Continue lyrica  Chronic hypotension Midodrine, adjust dose to tid  Normocytic anemia, no evidence of iron deficiency; suspect anemia of chronic disease Continue nutrition efforts Stop iron supplementation  Likely back to [employer] (right click and "Reselect all SmartList Selections" daily)   Diet/type: tubefeeds DVT prophylaxis: LMWH GI prophylaxis: N/A Lines: N/A Foley:  N/A Code Status:  full code Last date of multidisciplinary goals of care discussion [9/7]  Labs   CBC: Recent Labs  Lab 06/28/21 2049 06/28/21 2117 06/29/21 0551  WBC 9.3  --  9.0  NEUTROABS 5.9  --   --   HGB 11.1* 11.2* 11.3*  HCT 35.0* 33.0* 35.3*  MCV 96.7  --  94.6  PLT 237  --  193    Basic Metabolic Panel: Recent Labs  Lab 06/28/21 2049 06/28/21 2117 06/29/21 0551  NA  137 140 138  K 4.4 4.4 4.2  CL 106 105 107  CO2 25  --  22  GLUCOSE 88 91 78  BUN 18 18 16   CREATININE <0.30* 0.20* <0.30*  CALCIUM 8.9  --  9.0  MG  --   --  2.5*   GFR: CrCl cannot be calculated (This lab value cannot be used to calculate CrCl because it is not a number: <0.30). Recent Labs  Lab 06/28/21 2049 06/28/21 2057 06/28/21 2340 06/29/21 0551  WBC 9.3  --   --  9.0  LATICACIDVEN  --  0.7 0.7  --      Liver Function Tests: Recent Labs  Lab 06/28/21 2049  AST 19  ALT 18  ALKPHOS 88  BILITOT 0.4  PROT 6.9  ALBUMIN 3.2*   Recent Labs  Lab 06/28/21 2049  LIPASE 20   No results for input(s): AMMONIA in the last 168 hours.  ABG    Component Value Date/Time   TCO2 32 06/28/2021 2117     Coagulation Profile: No results for input(s): INR, PROTIME in the last 168 hours.  Cardiac Enzymes: No results for input(s): CKTOTAL, CKMB, CKMBINDEX, TROPONINI in the last 168 hours.  HbA1C: No results found for: HGBA1C  CBG: Recent Labs  Lab 06/29/21 0158 06/29/21 0306 06/29/21 0604  GLUCAP 86 82 73    Critical care time: n/a    08/29/21, MD  PCCM Pager: [phone] Cell: [phone] After 7:00 pm call Elink  [phone]

## 2021-06-29 NOTE — Progress Notes (Signed)
eLink Physician-Brief Progress Note Patient Name: Andrew Rivera DOB: June 01, 1951 MRN: [id number]   Date of Service  06/29/2021  HPI/Events of Note  CBG 73 gm / dl (trending down).  eICU Interventions  D 5 % LR gtt started at 50 ml / hour.        Thomasene Lot Devora Tortorella 06/29/2021, 6:39 AM

## 2021-06-29 NOTE — Progress Notes (Signed)
Lab called with critical positive MRSA swab. Order set placed, except for CHG wipes due to allergy.

## 2021-06-29 NOTE — Progress Notes (Signed)
Initial Nutrition Assessment  DOCUMENTATION CODES:   Non-severe (moderate) malnutrition in context of acute illness/injury  INTERVENTION:  Initiate tube feeds via PEG: - Start Jevity 1.5 @ 25 ml/hr and advance by 10 ml q 4 hours to goal rate of 55 ml/hr (1320 ml/day) - ProSource TF 45 ml TID - Free water flushes per MD, currently 200 ml q 4 hours  Tube feeding regimen at goal provides 2100 kcal, 117 grams of protein, and 1003 ml of H2O.  Total free water with flushes: 2203 ml  NUTRITION DIAGNOSIS:   Moderate Malnutrition related to acute illness (ileus) as evidenced by mild fat depletion, moderate fat depletion, mild muscle depletion, severe muscle depletion.  GOAL:   Patient will meet greater than or equal to 90% of their needs  MONITOR:   Labs, Weight trends, TF tolerance, I & O's  REASON FOR ASSESSMENT:   Ventilator, Consult Enteral/tube feeding initiation and management  ASSESSMENT:   70 year old male who presented to the ED from [facility] on 9/06 with possible ileus and constipation. PMH of chronic respiratory failure secondary to multiple prior back surgeries and MSSA bacteremia, s/p trach and PEG, chronic pain, constipation.  Discussed pt with RN and during ICU rounds. Pt now having bowel movements. Plan is for pt to d/c to [facility].  Consult received for tube feeding initiation and management. MD has ordered 200 ml free water flushes q 4 hours.  Reviewed weight history in chart. Pt with a 4.2 kg weight loss since 06/09/21. This is a 4.7% weight loss in 3 weeks which is significant for timeframe. Pt meets criteria for acute malnutrition, suspect this is secondary to recurrent ileus and TF being held as a result.  Patient is currently on ventilator support on trach MV: 9 L/min Temp (24hrs), Avg:98.3 F (36.8 C), Min:98.2 F (36.8 C), Max:98.4 F (36.9 C)  Medications reviewed and include: nutrisource fiber BID, melatonin, IV protonix, miralax, senokot IVF: D5 in  LR @ 50 ml/hr  Labs reviewed: mangeisum 2.5, ionized calcium 1.12 CBG's: 73-89 x 24 hours  I/O's: -1.4 L since admit  NUTRITION - FOCUSED PHYSICAL EXAM:  Flowsheet Row Most Recent Value  Orbital Region Moderate depletion  Upper Arm Region No depletion  Thoracic and Lumbar Region Mild depletion  Buccal Region Mild depletion  Temple Region Severe depletion  Clavicle Bone Region Mild depletion  Clavicle and Acromion Bone Region Mild depletion  Scapular Bone Region Unable to assess  Dorsal Hand No depletion  Patellar Region No depletion  Anterior Thigh Region No depletion  Posterior Calf Region No depletion  Edema (RD Assessment) Mild  [BUE, BLE]  Hair Reviewed  Eyes Reviewed  Mouth Reviewed  Skin Reviewed  Nails Reviewed       Diet Order:   Diet Order             Diet NPO time specified  Diet effective now                   EDUCATION NEEDS:   No education needs have been identified at this time  Skin:  Skin Assessment: Reviewed RN Assessment  Last BM:  06/29/21 multiple type 7 via rectal tube  Height:   Ht Readings from Last 1 Encounters:  05/29/21 5\' 8"  (1.727 m)    Weight:   Wt Readings from Last 1 Encounters:  06/29/21 84.3 kg    BMI:  Body mass index is 28.26 kg/m.  Estimated Nutritional Needs:   Kcal:  1900-2100  Protein:  100-125 grams  Fluid:  >/= 2.0 L    Mertie Clause, MS, RD, LDN Inpatient Clinical Dietitian Please see AMiON for contact information.

## 2021-06-29 NOTE — Progress Notes (Addendum)
eLink Physician-Brief Progress Note Patient Name: Andrew Rivera DOB: [birth year] MRN: [id number]   Date of Service  06/29/2021  HPI/Events of Note  Patient is an LTAC resident with chronic respiratory failure s/p tracheostomy and PEG, he was transferred to [facility] hospital ED secondary to abdominal distension and lack of a bowel movement for several days, CTAP suggestive of ileus, he was admitted to the ICU due to the requirement for chronic mechanical ventilation. GI has been consulted. Patient received Golytely and copious diarrhea is anticipated.  eICU Interventions  New Patient Evaluation. Flexiseal ordered PRN profuse diarrhea.        Thomasene Lot Kyree Adriano 06/29/2021, 2:17 AM

## 2021-06-29 NOTE — Progress Notes (Signed)
Patient was transported to [facility] from ER via the ventilator with no complications.

## 2021-06-30 ENCOUNTER — Other Ambulatory Visit (HOSPITAL_COMMUNITY): Payer: Self-pay

## 2021-06-30 DIAGNOSIS — E440 Moderate protein-calorie malnutrition: Secondary | ICD-10-CM | POA: Insufficient documentation

## 2021-06-30 DIAGNOSIS — K567 Ileus, unspecified: Secondary | ICD-10-CM | POA: Diagnosis not present

## 2021-06-30 DIAGNOSIS — J9610 Chronic respiratory failure, unspecified whether with hypoxia or hypercapnia: Secondary | ICD-10-CM | POA: Diagnosis not present

## 2021-06-30 DIAGNOSIS — Z930 Tracheostomy status: Secondary | ICD-10-CM | POA: Diagnosis not present

## 2021-06-30 DIAGNOSIS — Z9911 Dependence on respirator [ventilator] status: Secondary | ICD-10-CM | POA: Diagnosis not present

## 2021-06-30 LAB — CBC WITH DIFFERENTIAL/PLATELET
Abs Immature Granulocytes: 0.02 10*3/uL (ref 0.00–0.07)
Basophils Absolute: 0 10*3/uL (ref 0.0–0.1)
Basophils Relative: 0 %
Eosinophils Absolute: 0.3 10*3/uL (ref 0.0–0.5)
Eosinophils Relative: 4 %
HCT: 29.3 % — ABNORMAL LOW (ref 39.0–52.0)
Hemoglobin: 9.7 g/dL — ABNORMAL LOW (ref 13.0–17.0)
Immature Granulocytes: 0 %
Lymphocytes Relative: 24 %
Lymphs Abs: 1.8 10*3/uL (ref 0.7–4.0)
MCH: 30.8 pg (ref 26.0–34.0)
MCHC: 33.1 g/dL (ref 30.0–36.0)
MCV: 93 fL (ref 80.0–100.0)
Monocytes Absolute: 0.9 10*3/uL (ref 0.1–1.0)
Monocytes Relative: 12 %
Neutro Abs: 4.4 10*3/uL (ref 1.7–7.7)
Neutrophils Relative %: 60 %
Platelets: 189 10*3/uL (ref 150–400)
RBC: 3.15 MIL/uL — ABNORMAL LOW (ref 4.22–5.81)
RDW: 15.7 % — ABNORMAL HIGH (ref 11.5–15.5)
WBC: 7.5 10*3/uL (ref 4.0–10.5)
nRBC: 0 % (ref 0.0–0.2)

## 2021-06-30 LAB — COMPREHENSIVE METABOLIC PANEL
ALT: 13 U/L (ref 0–44)
AST: 13 U/L — ABNORMAL LOW (ref 15–41)
Albumin: 2.9 g/dL — ABNORMAL LOW (ref 3.5–5.0)
Alkaline Phosphatase: 79 U/L (ref 38–126)
Anion gap: 13 (ref 5–15)
BUN: 19 mg/dL (ref 8–23)
CO2: 22 mmol/L (ref 22–32)
Calcium: 9 mg/dL (ref 8.9–10.3)
Chloride: 104 mmol/L (ref 98–111)
Creatinine, Ser: 0.34 mg/dL — ABNORMAL LOW (ref 0.61–1.24)
GFR, Estimated: >60 mL/min (ref >60)
Glucose, Bld: 129 mg/dL — ABNORMAL HIGH (ref 70–99)
Potassium: 2.7 mmol/L — CL (ref 3.5–5.1)
Sodium: 139 mmol/L (ref 135–145)
Total Bilirubin: 0.6 mg/dL (ref 0.3–1.2)
Total Protein: 6.1 g/dL — ABNORMAL LOW (ref 6.5–8.1)

## 2021-06-30 LAB — GLUCOSE, CAPILLARY
Glucose-Capillary: 122 mg/dL — ABNORMAL HIGH (ref 70–99)
Glucose-Capillary: 144 mg/dL — ABNORMAL HIGH (ref 70–99)
Glucose-Capillary: 129 mg/dL — ABNORMAL HIGH (ref 70–99)
Glucose-Capillary: 125 mg/dL — ABNORMAL HIGH (ref 70–99)

## 2021-06-30 MED ORDER — POTASSIUM CHLORIDE 20 MEQ PO PACK
40.0000 meq | PACK | Freq: Once | ORAL | Status: AC
  Administered 2021-06-30: 40 meq
  Filled 2021-06-30: qty 2

## 2021-06-30 MED ORDER — DOCUSATE SODIUM 100 MG PO CAPS: 100.0000 mg | ORAL_CAPSULE | Freq: Two times a day (BID) | ORAL | 0 refills | Status: DC | PRN

## 2021-06-30 MED ORDER — ORAL CARE MOUTH RINSE: 15.0000 mL | Freq: Two times a day (BID) | OROMUCOSAL | 0 refills | Status: DC

## 2021-06-30 MED ORDER — POTASSIUM CHLORIDE 10 MEQ/100ML IV SOLN
10.0000 meq | INTRAVENOUS | Status: DC
  Administered 2021-06-30: 10 meq via INTRAVENOUS
  Filled 2021-06-30 (×3): qty 100

## 2021-06-30 MED ORDER — MIDODRINE HCL 10 MG PO TABS: 10.0000 mg | ORAL_TABLET | Freq: Three times a day (TID) | ORAL | Status: DC

## 2021-06-30 MED ORDER — LACTULOSE ENCEPHALOPATHY 10 GM/15ML PO SOLN
20.0000 g | Freq: Two times a day (BID) | ORAL | 0 refills | Status: DC | PRN
  Filled 2021-06-30: qty 236, 4d supply, fill #0

## 2021-06-30 MED ORDER — HYDROMORPHONE HCL 2 MG PO TABS: 1.0000 mg | ORAL_TABLET | Freq: Three times a day (TID) | ORAL | 0 refills | Status: DC | PRN

## 2021-06-30 MED ORDER — MUPIROCIN 2 % EX OINT: 1.0000 "application " | TOPICAL_OINTMENT | Freq: Two times a day (BID) | CUTANEOUS | 0 refills | Status: DC

## 2021-06-30 MED ORDER — LACTULOSE ENCEPHALOPATHY 10 GM/15ML PO SOLN: 20.0000 g | Freq: Two times a day (BID) | ORAL | 0 refills | Status: DC | PRN

## 2021-06-30 NOTE — Discharge Summary (Signed)
Discharge Summary         Patient ID: Andrew Rivera MRN: [id number] DOB/AGE: [birth year] 70 y.o.  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   He has a past medical history significant for chronic respiratory failure secondary to multiple prior back surgeries and MSSA bacteria which eventally required trach/peg, chronic pain, and constipation.  9/6 CT ABD pelvis showed diffuse colonic distention with the colon distended with both air and liquid stool. No obstruction or volvulus. No small bowel dilation. No colonic wall thickening, edema, or pneumatosis. Gastronomy tube in the stomach with balloon in appropriate position. A fat containing inguinal hernia was seen.  9/6-7 He was admitted to ICU do to being on a mechanical ventilator. He was given golytely for bowel prep which resulted in bowel movements with over 2.9L of documented stool output. This resulted in relief from his constipation and bowel pain. His robinul and iron were stopped. His hydromorphone was decreased by 1/2 previous dose.    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 Miralax, senna, lactulose, and fiber. Stop iron and  robinul. Hydromorphone as discussed.  Constipation- Resolved -See above  Chronic hypotension -Continue midodrine 10mg  TID  Normocytic anemia -Continue nutrition -Stop Iron   Significant Hospital tests/ studies  9/6 CT ABD pelvis showed diffuse colonic distention with the colon distended with both air and liquid stool. No obstruction or volvulus. No small bowel dilation. No colonic wall thickening, edema, or pneumatosis. Gastronomy tube in the stomach with balloon in appropriate position. A fat containing inguinal hernia was seen. Procedures   None Culture data/antimicrobials      Consults  None  Discharge Exam: BP (!) 117/47 (BP Location: Left Wrist)   Pulse (!) 50   Temp 99.2 F (37.3 C) (Oral)   Resp 16   Wt 85 kg   SpO2 99%   BMI 28.49 kg/m   Vent Mode: PRVC FiO2 (%):  [28 %-40 %] 28 % Set Rate:  [16 bmp] 16 bmp Vt Set:  [550 mL] 550 mL PEEP:  [5 cmH20] 5 cmH20 Plateau Pressure:  [17 cmH20-22 cmH20] 19 cmH20   General:  in bed, on vent, NAD HEENT: MM pink/moist, anicteric, atraumatic Neuro: GCS 11t, RASS 0, PERRL CV: S1S2, SB, no m/r/g appreciated PULM:  air movement in all lobes lobes, Trachea midline, chest expansion symmetric, tracheostomy in place GI: soft, bsx4 active, rounded  Extremities: warm/dry, no pretibial edema, capillary refill less than 3 seconds  Skin: no rashes or lesions  Labs at discharge   Lab Results  Component Value Date   CREATININE 0.34 (L) 06/30/2021   BUN 19 06/30/2021   NA 139 06/30/2021   K 2.7 (LL) 06/30/2021   CL 104 06/30/2021   CO2 22 06/30/2021   Lab Results  Component Value Date   WBC 7.5 06/30/2021  HGB 9.7 (L) 06/30/2021   HCT 29.3 (L) 06/30/2021   MCV 93.0 06/30/2021   PLT 189 06/30/2021   Lab Results  Component Value Date   ALT 13 06/30/2021   AST 13 (L) 06/30/2021   ALKPHOS 79 06/30/2021   BILITOT 0.6 06/30/2021   No results found for: INR, PROTIME  Current radiological studies    DG Abd 1  View  Result Date: 06/29/2021 CLINICAL DATA:  Ogilvie syndrome EXAM: ABDOMEN - 1 VIEW COMPARISON:  Portable exam 0907 hours compared to CT abdomen and pelvis 06/28/2021 FINDINGS: Diffuse gaseous distention of 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 No colonic wall thickening identified. Small bowel gas pattern normal. Inferior pelvis/rectum not imaged. Excreted contrast material within urinary bladder. Osseous demineralization with scoliosis and degenerative changes of the thoracolumbar spine. Prior lumbar and SI joint fusions. IMPRESSION: Gaseous distention of colon consistent with history of Ogilvie syndrome though could also be seen with ileus. No definite obstruction identified. Electronically Signed   By: Ulyses Southward M.D.   On: 06/29/2021 12:07   CT ABDOMEN PELVIS W CONTRAST  Result Date: 06/28/2021 CLINICAL DATA:  Bowel obstruction suspected Abdominal pain, acute, nonlocalized EXAM: CT ABDOMEN AND PELVIS WITH CONTRAST TECHNIQUE: Multidetector CT imaging of the abdomen and pelvis was performed using the standard protocol following bolus administration of intravenous contrast. CONTRAST:  63mL OMNIPAQUE IOHEXOL 350 MG/ML SOLN COMPARISON:  CT 04/23/2021 FINDINGS: Lower chest: Right lung base atelectasis with elevation of right hemidiaphragm. There are coronary artery calcifications. Chronic atelectasis in the dependent left lower lobe. Hepatobiliary: Stable subcentimeter hepatic hypodensities as well as small cysts in the liver. Gallbladder physiologically distended, no calcified stone. No biliary dilatation. Pancreas: No ductal dilatation or inflammation. Spleen: Normal in size without focal abnormality. Adrenals/Urinary Tract: Normal adrenal glands. No hydronephrosis or perinephric edema. Homogeneous renal enhancement with symmetric excretion on delayed phase imaging. Urinary bladder is physiologically distended without wall thickening. Stomach/Bowel: Gastrostomy tube in the stomach, balloon  appropriately positioned. Stomach is decompressed. Small bowel is decompressed. The cecum is again noted to be in the midline, however no evidence of volvulus. Diffuse colonic distension. Colonic distention is primarily gaseous, with liquid stool involving the distal transverse and sigmoid colon. Sigmoid colon is redundant coursing into the central abdomen. There is liquid stool in the rectum. There is no colonic wall thickening, pericolonic edema, or bowel pneumatosis. There is enteric contrast within the cecum and ascending colon. Occasional colonic diverticula. Appendix not confidently seen. Similar presacral edema. Vascular/Lymphatic: Aortic atherosclerosis. No aortic aneurysm. No portal vein or mesenteric air or thrombosis. No acute vascular findings. Enlarged lymph nodes in the abdomen or pelvis. Reproductive: Prominent prostate gland spans 5 cm transverse. Other: Prior right lower quadrant hernia repair with tacks. Fat containing left inguinal hernia. There is no free air or ascites. Skin thickening and soft tissue edema involving the left lower abdominal wall may represent medication injection sites. The previous body wall inflammatory change on prior has improved. Musculoskeletal: Postsurgical and degenerative change throughout the spine. Bones are diffusely under mineralized. Ghost tracks in the spine from prior hardware. Stable osseous structures from prior. Generalized fatty atrophy of the included musculature. IMPRESSION: 1. Diffuse colonic distension, similar in appearance to prior exam. Colon is distended with both air and liquid stool. Findings may be due to colonic ileus or Ogilvie syndrome. No obstruction or volvulus. No small bowel dilatation. No colonic wall thickening, pericolonic edema, or bowel pneumatosis. 2. Gastrostomy tube in the stomach, balloon appropriately positioned. 3. Fat  containing left inguinal hernia. Aortic Atherosclerosis (ICD10-I70.0). Electronically Signed   By: Narda Rutherford M.D.   On: 06/28/2021 22:59    Disposition:  [facility] to [facility]   There are no questions and answers to display.         Allergies as of 06/30/2021       Reactions   Chlorhexidine Itching, Rash   Codeine Nausea Only   Oxycodone-acetaminophen Nausea Only   Other reaction(s): Hallucinations        Medication List     STOP taking these medications    Digestive Enzymes Tabs   glycopyrrolate 1 MG tablet Commonly known as: ROBINUL       TAKE these medications    acetaminophen 160 MG/5ML solution Commonly known as: TYLENOL Place 30 mLs (960 mg total) into feeding tube every 8 (eight) hours.   bethanechol 10 MG tablet Commonly known as: URECHOLINE Take 10 mg by mouth 3 (three) times daily.   Carboxymethylcellulose Sodium 1 % Gel Place 1 drop into both eyes in the morning, at noon, and at bedtime.   docusate sodium 100 MG capsule Commonly known as: COLACE Take 1 capsule (100 mg total) by mouth 2 (two) times daily as needed for mild constipation.   feeding supplement (PROSource TF) liquid Place 45 mLs into feeding tube 3 (three) times daily. What changed: Another medication with the same name was changed. Make sure you understand how and when to take each.   feeding supplement (JEVITY 1.5 CAL/FIBER) Liqd Place 1,000 mLs into feeding tube continuous. What changed:  how much to take when to take this additional instructions   fentaNYL 25 MCG/HR Commonly known as: DURAGESIC Place 1 patch onto the skin every 3 (three) days.   ferrous sulfate 325 (65 FE) MG tablet Take 325 mg by mouth See admin instructions. Every 2 days per g-tube   fiber Pack packet Place 1 packet into feeding tube 2 (two) times daily.   HYDROmorphone 2 MG tablet Commonly known as: DILAUDID Place 0.5 tablets (1 mg total) into feeding tube every 8 (eight) hours as needed for severe pain. What changed: how much to take   hydrOXYzine 25 MG tablet Commonly known as:  ATARAX/VISTARIL Place 25 mg into feeding tube every 6 (six) hours as needed for anxiety.   ipratropium-albuterol 0.5-2.5 (3) MG/3ML Soln Commonly known as: DUONEB Take 3 mLs by nebulization every 4 (four) hours as needed (respiratory failure).   lactulose 10 GM/15ML solution Commonly known as: CHRONULAC Place 30 mLs (20 g total) into feeding tube 2 (two) times daily as needed for mild constipation. What changed: reasons to take this   lansoprazole 30 MG capsule Commonly known as: PREVACID Place 30 mg into feeding tube daily at 12 noon.   levocetirizine 5 MG tablet Commonly known as: XYZAL Place 5 mg into feeding tube every evening.   LORazepam 0.5 MG tablet Commonly known as: ATIVAN Place 1 tablet (0.5 mg total) into feeding tube every 8 (eight) hours as needed for anxiety.   Melatonin 10 MG Tabs Give 10 mg by tube at bedtime.   midodrine 10 MG tablet Commonly known as: PROAMATINE Place 1 tablet (10 mg total) into feeding tube 3 (three) times daily. What changed:  when to take this Another medication with the same name was removed. Continue taking this medication, and follow the directions you see here.   mouth rinse Liqd solution 15 mLs by Mouth Rinse route in the morning and at bedtime.   mupirocin ointment  2 % Commonly known as: BACTROBAN Place 1 application into the nose 2 (two) times daily.   polyethylene glycol 17 g packet Commonly known as: MIRALAX / GLYCOLAX Place 17 g into feeding tube 2 (two) times daily.   pregabalin 100 MG capsule Commonly known as: LYRICA Place 1 capsule (100 mg total) into feeding tube 3 (three) times daily.   sennosides 8.8 MG/5ML syrup Commonly known as: SENOKOT Place 10 mLs into feeding tube 2 (two) times daily.         Follow-up appointment   none Discharge Condition:    stable  Physician Statement:   The Patient was personally examined, the discharge assessment and plan has been personally reviewed and I agree with  ACNP Nirav Sweda's assessment and plan. 36 minutes of time have been dedicated to discharge assessment, planning and discharge instructions.   Signed: Eliezer Champagne 06/30/2021, 11:03 AM

## 2021-06-30 NOTE — TOC Transition Note (Addendum)
Transition of Care (TOC) - CM/SW Discharge Note *Discharged to [facility] *Room number 303 A *Number for Report: [phone]    Patient Details  Name: Andrew Rivera MRN: [id number] Date of Birth: Nov 03, 1950  Transition of Care [facility]) CM/SW Contact:   Cristobal Goldmann, LCSW Phone Number: 06/30/2021, 12:54 PM   Clinical Narrative:  CSW informed that patient ready for discharge back to [facility]. Call mae to Prem at [facility] and CSW advised that patient can return. CSW provided with number for report: [phone] and receiving MD - Dr. Lum Keas. Patient will go to room 303-A.   Call made to patient mother - Andrew Rivera regarding discharge and she is in agreement. Ms. Berkovich was advised that she will be contacted once transport arranged. Discharge clinicals transmitted to facility.   2:40 pm: [facility] Transportation arranged. Ms. Taddei and Prem with [facility] contacted and informed that transport arranged.   Final next level of care: Long Term Nursing Home ([facility] - Room 303A) Barriers to Discharge: Barriers Resolved   Patient Goals and CMS Choice Patient states their goals for this hospitalization and ongoing recovery are:: Mother in agreement with patient returning to [facility] CMS Medicare.gov Compare Post Acute Care list provided to:: Other (Comment Required) (Not needed as patient returning to [facility]) Choice offered to / list presented to : NA  Discharge Placement              Patient chooses bed at: [facility] Patient to be transferred to facility by: EMS Name of family member notified: Mother Andrew Rivera [phone] (cell) Patient and family notified of of transfer: 06/30/21  Discharge Plan and Services In-house Referral: Clinical Social Work   Post Acute Care Choice: Skilled Nursing Facility ([facility])                               Social Determinants of Health (SDOH) Interventions  No SDOH  interventions requested or needed at discharge.   Readmission Risk Interventions No flowsheet data found.

## 2021-06-30 NOTE — Progress Notes (Signed)
[facility] ADULT ICU REPLACEMENT PROTOCOL   The patient does apply for the [facility] Adult ICU Electrolyte Replacment Protocol based on the criteria listed below:   1.Exclusion criteria: TCTS patients, ECMO patients and Hypothermia Protocol, and   Dialysis patients 2. Is GFR >/= 30 ml/min? Yes.    Patient's GFR today is >60 3. Is SCr </= 2? Yes.   Patient's SCr is 0.34 mg/dL 4. Did SCr increase >/= 0.5 in 24 hours? No. 5.Pt's weight >40kg  Yes.   6. Abnormal electrolyte(s): K 2.7  7. Electrolytes replaced per protocol 8.  Call MD STAT for K+ </= 2.5, Phos </= 1, or Mag </= 1 Physician:  Mauri Pole, Sybol Morre A 06/30/2021 4:16 AM

## 2021-06-30 NOTE — Progress Notes (Signed)
Pt discharged at 1615 via [facility] to [facility] in [place]. Pts VSS at time of transfer. Wife, [name] updated on patients departure. Report given to [facility] Charity fundraiser.

## 2021-06-30 NOTE — TOC Initial Note (Signed)
Transition of Care [facility]) - Initial/Assessment Note    Patient Details  Name: Andrew Rivera MRN: [id number] Date of Birth: 1950/12/14  Transition of Care [facility]) CM/SW Contact:    Andrew Goldmann, LCSW Phone Number: 06/30/2021, 12:48 PM  Clinical Narrative: CSW informed during 7M unit progression that patient ready for discharge and return to [facility]. CSW contacted Prem with [facility] an informed him of patient's readiness for discharge. Patient can return and the number for report is: [phone]. The attending MD at [facility] is Dr. Marcene Rivera, and patient going to room 303A.  Call made to patient's mother Andrew Rivera - [phone] (cell) regarding discharge. [name] was advised that she would be informed once transport arranged.                    Expected Discharge Plan: Long Term Nursing Home (Patient returing to [facility]) Barriers to Discharge: Barriers Resolved   Patient Goals and CMS Choice Patient states their goals for this hospitalization and ongoing recovery are:: Mother in agreement with patient returning to [facility] CMS Medicare.gov Compare Post Acute Care list provided to:: Other (Comment Required) (Info not provided as patient returning to [facility]) Choice offered to / list presented to : NA  Expected Discharge Plan and Services Expected Discharge Plan: Long Term Nursing Home (Patient returing to [facility]) In-house Referral: Clinical Social Work   Post Acute Care Choice: Skilled Nursing Facility ([facility]) Living arrangements for the past 2 months: Skilled Nursing Facility ([facility]) Expected Discharge Date: 06/30/21                                    Prior Living Arrangements/Services Living arrangements for the past 2 months: Skilled Nursing Facility ([facility]) Lives with:: Facility Resident Patient language and need for interpreter reviewed:: No Do you feel safe going back to the place where you live?: Yes      Need  for Family Participation in Patient Care: Yes (Comment) Care giver support system in place?: Yes (comment)   Criminal Activity/Legal Involvement Pertinent to Current Situation/Hospitalization: No - Comment as needed  Activities of Daily Living      Permission Sought/Granted Permission sought to share information with : Facility Medical sales representative, Family Supports (Patient unable to give permission.) Permission granted to share information with : No (Patient unable to give permission)              Emotional Assessment Appearance:: Other (Comment Required (Did not visit with patient) Attitude/Demeanor/Rapport: Unable to Assess Affect (typically observed): Unable to Assess Orientation: : Oriented to Self, Oriented to Place Alcohol / Substance Use: Not Applicable Psych Involvement: No (comment)  Admission diagnosis:  Ileus (HCC) [K56.7] Ventilator dependent (HCC) [Z99.11] Patient Active Problem List   Diagnosis Date Noted   Malnutrition of moderate degree 06/30/2021   Left inguinal hernia 06/29/2021   Dysautonomia (HCC)    Ventilator dependent (HCC)    Tracheostomy dependence (HCC)    Chronic respiratory failure requiring continuous mechanical ventilation through tracheostomy (HCC)    Abdominal distension    Constipation    Hypokalemia    Ileus (HCC) 04/24/2021   Pressure injury of skin 01/28/2020   Acute hypoxemic respiratory failure (HCC) 01/26/2020   Acute on chronic respiratory failure with hypoxia (HCC)    Septic shock (HCC)    Altered mental status, unspecified    Critical illness myopathy  Chronic pain syndrome    Internal jugular (IJ) vein thromboembolism, acute, unspecified laterality (HCC)    PCP:  Larena Glassman, MD Pharmacy:   [facility] [address] [place] [address] Phone: [phone] Fax: [phone]     Social Determinants of Health (SDOH) Interventions  No SDOH interventions requested or needed at  discharge.  Readmission Risk Interventions No flowsheet data found.

## 2021-07-05 ENCOUNTER — Emergency Department (HOSPITAL_COMMUNITY)
Admission: EM | Admit: 2021-07-05 | Discharge: 2021-07-05 | Disposition: A | Discharge status: Other Acute Inpatient Hospital | Payer: Medicare HMO | Attending: Emergency Medicine | Admitting: Emergency Medicine

## 2021-07-05 ENCOUNTER — Emergency Department (HOSPITAL_COMMUNITY): Payer: Medicare HMO

## 2021-07-05 DIAGNOSIS — R7309 Other abnormal glucose: Secondary | ICD-10-CM | POA: Insufficient documentation

## 2021-07-05 DIAGNOSIS — K9423 Gastrostomy malfunction: Secondary | ICD-10-CM | POA: Diagnosis not present

## 2021-07-05 DIAGNOSIS — Z87891 Personal history of nicotine dependence: Secondary | ICD-10-CM | POA: Insufficient documentation

## 2021-07-05 DIAGNOSIS — R109 Unspecified abdominal pain: Secondary | ICD-10-CM | POA: Diagnosis present

## 2021-07-05 DIAGNOSIS — T85528A Displacement of other gastrointestinal prosthetic devices, implants and grafts, initial encounter: Secondary | ICD-10-CM

## 2021-07-05 HISTORY — PX: IR REPLC GASTRO/COLONIC TUBE PERCUT W/FLUORO: IMG2333

## 2021-07-05 LAB — CBG MONITORING, ED: Glucose-Capillary: 82 mg/dL (ref 70–99)

## 2021-07-05 MED ORDER — IOHEXOL 240 MG/ML SOLN
50.0000 mL | Freq: Once | INTRAMUSCULAR | Status: AC | PRN
  Administered 2021-07-05: 10 mL via INTRAVENOUS

## 2021-07-05 MED ORDER — LIDOCAINE VISCOUS HCL 2 % MT SOLN
OROMUCOSAL | Status: AC
  Filled 2021-07-05: qty 15

## 2021-07-05 NOTE — ED Notes (Signed)
Pt requesting to be suctioned. RT called and coming to suction pt.

## 2021-07-05 NOTE — ED Notes (Signed)
[facility] called for transport back to facility.

## 2021-07-05 NOTE — Procedures (Signed)
Pre procedural Dx: Dysphagia, Inadvertent removal of G-tube Post procedural Dx: Same  Successful fluoroscopic guided replacement of existing 18 Fr gastrostomy tube.   The feeding tube is ready for immediate use.  EBL: None  Complications: None immediate.  Katherina Right, MD Pager #: [phone]

## 2021-07-05 NOTE — ED Provider Notes (Signed)
[facility] EMERGENCY DEPARTMENT Provider Note   CSN: [id number] Arrival date & time: 07/05/21  1226     History Chief Complaint  Patient presents with   Abdominal Pain    Andrew Rivera is a 70 y.o. male.  History is limited due to tracheostomy.  Patient is in a long-term care facility.  Staff noted PEG tube to be out this morning.  They tried to contact interventional radiology who was unable to accommodate placement.  They brought the patient to the emergency department for placement of PEG tube.  Patient denies chest pain abdominal pain or difficulty breathing.  Currently on the vent.  The history is provided by the patient and the EMS personnel.      Past Medical History:  Diagnosis Date   Acute on chronic respiratory failure with hypoxia (HCC)    Altered mental status, unspecified    Chronic pain syndrome    Critical illness myopathy    Internal jugular (IJ) vein thromboembolism, acute, unspecified laterality (HCC)    Septic shock [facility])     Patient Active Problem List   Diagnosis Date Noted   Malnutrition of moderate degree 06/30/2021   Left inguinal hernia 06/29/2021   Dysautonomia (HCC)    Ventilator dependent (HCC)    Tracheostomy dependence (HCC)    Chronic respiratory failure requiring continuous mechanical ventilation through tracheostomy (HCC)    Abdominal distension    Constipation    Hypokalemia    Ileus (HCC) 04/24/2021   Pressure injury of skin 01/28/2020   Acute hypoxemic respiratory failure (HCC) 01/26/2020   Acute on chronic respiratory failure with hypoxia (HCC)    Septic shock (HCC)    Altered mental status, unspecified    Critical illness myopathy    Chronic pain syndrome    Internal jugular (IJ) vein thromboembolism, acute, unspecified laterality (HCC)     No past surgical history on file.     No family history on file.  Social History   Tobacco Use   Smoking status: Former    Packs/day: 1.00    Types: Cigarettes     Quit date: 01/25/1990    Years since quitting: 31.4   Smokeless tobacco: Never  Substance Use Topics   Alcohol use: Not Currently   Drug use: Never    Home Medications Prior to Admission medications   Medication Sig Start Date End Date Taking? Authorizing Provider  acetaminophen (TYLENOL) 160 MG/5ML solution Place 30 mLs (960 mg total) into feeding tube every 8 (eight) hours. 05/05/21   Almon Hercules, MD  bethanechol (URECHOLINE) 10 MG tablet Take 10 mg by mouth 3 (three) times daily.    [provider]  Carboxymethylcellulose Sodium 1 % GEL Place 1 drop into both eyes in the morning, at noon, and at bedtime.    [provider]  docusate sodium (COLACE) 100 MG capsule Take 1 capsule (100 mg total) by mouth 2 (two) times daily as needed for mild constipation. 06/30/21   Eliezer Champagne, NP  fentaNYL (DURAGESIC) 25 MCG/HR Place 1 patch onto the skin every 3 (three) days. 06/09/21   Russella Dar, NP  ferrous sulfate 325 (65 FE) MG tablet Take 325 mg by mouth See admin instructions. Every 2 days per g-tube    [provider]  fiber (NUTRISOURCE FIBER) PACK packet Place 1 packet into feeding tube 2 (two) times daily. 06/09/21   Russella Dar, NP  HYDROmorphone (DILAUDID) 2 MG tablet Place 0.5 tablets (1 mg  total) into feeding tube every 8 (eight) hours as needed for severe pain. 06/30/21   Eliezer Champagne, NP  hydrOXYzine (ATARAX/VISTARIL) 25 MG tablet Place 25 mg into feeding tube every 6 (six) hours as needed for anxiety.    [provider]  ipratropium-albuterol (DUONEB) 0.5-2.5 (3) MG/3ML SOLN Take 3 mLs by nebulization every 4 (four) hours as needed (respiratory failure).    [provider]  lactulose, encephalopathy, (CHRONULAC) 10 GM/15ML SOLN Place 30 mLs (20 g total) into feeding tube 2 (two) times daily as needed for mild constipation. 06/30/21   Eliezer Champagne, NP  lansoprazole (PREVACID) 30 MG capsule Place 30 mg into feeding tube daily  at 12 noon.    [provider]  levocetirizine (XYZAL) 5 MG tablet Place 5 mg into feeding tube every evening.    [provider]  LORazepam (ATIVAN) 0.5 MG tablet Place 1 tablet (0.5 mg total) into feeding tube every 8 (eight) hours as needed for anxiety. 06/09/21   Russella Dar, NP  Melatonin 10 MG TABS Give 10 mg by tube at bedtime.    [provider]  midodrine (PROAMATINE) 10 MG tablet Place 1 tablet (10 mg total) into feeding tube 3 (three) times daily. 06/30/21   Eliezer Champagne, NP  Mouthwashes (MOUTH RINSE) LIQD solution 15 mLs by Mouth Rinse route in the morning and at bedtime. 06/30/21   Eliezer Champagne, NP  mupirocin ointment (BACTROBAN) 2 % Place 1 application into the nose 2 (two) times daily. 06/30/21   Eliezer Champagne, NP  Nutritional Supplements (FEEDING SUPPLEMENT, JEVITY 1.5 CAL/FIBER,) LIQD Place 1,000 mLs into feeding tube continuous. Patient taking differently: Place into feeding tube See admin instructions. 55 ml / hr every shift. 06/09/21   Russella Dar, NP  Nutritional Supplements (FEEDING SUPPLEMENT, PROSOURCE TF,) liquid Place 45 mLs into feeding tube 3 (three) times daily. Patient not taking: Reported on 06/29/2021 06/09/21   Russella Dar, NP  polyethylene glycol (MIRALAX / GLYCOLAX) 17 g packet Place 17 g into feeding tube 2 (two) times daily. 06/09/21   Russella Dar, NP  pregabalin (LYRICA) 100 MG capsule Place 1 capsule (100 mg total) into feeding tube 3 (three) times daily. 06/09/21   Russella Dar, NP  sennosides (SENOKOT) 8.8 MG/5ML syrup Place 10 mLs into feeding tube 2 (two) times daily. 06/09/21   Russella Dar, NP  sertraline (ZOLOFT) 50 MG tablet Place 50 mg into feeding tube at bedtime.  04/28/21  [provider]    Allergies    Chlorhexidine, Codeine, and Oxycodone-acetaminophen  Review of Systems   Review of Systems  Constitutional:  Negative for fever.  HENT:  Negative for sore throat.   Eyes:  Negative  for visual disturbance.  Respiratory:  Negative for shortness of breath.   Cardiovascular:  Negative for chest pain.  Gastrointestinal:  Negative for abdominal pain.  Genitourinary:  Negative for hematuria.  Musculoskeletal:  Negative for neck pain.  Skin:  Negative for rash.  Neurological:  Negative for headaches.   Physical Exam Updated Vital Signs BP (!) 122/56   Pulse 87   Temp 98.2 F (36.8 C) (Oral)   Resp 16   SpO2 99%   Physical Exam Vitals and nursing note reviewed.  Constitutional:      Appearance: He is well-developed.  HENT:     Head: Normocephalic and atraumatic.  Eyes:     Conjunctiva/sclera: Conjunctivae normal.  Cardiovascular:     Rate  and Rhythm: Normal rate and regular rhythm.     Heart sounds: No murmur heard. Pulmonary:     Effort: Pulmonary effort is normal. No respiratory distress.     Breath sounds: Normal breath sounds.  Abdominal:     General: There is distension.     Palpations: Abdomen is soft.     Tenderness: There is no abdominal tenderness.  Musculoskeletal:        General: No tenderness.     Cervical back: Neck supple.     Comments: Patient has bilateral bunion boots  Skin:    General: Skin is warm and dry.  Neurological:     Mental Status: He is alert.    ED Results / Procedures / Treatments   Labs (all labs ordered are listed, but only abnormal results are displayed) Labs Reviewed - No data to display  EKG None  Radiology IR Replc Gastro/Colonic Tube Percut W/Fluoro  Result Date: 07/05/2021 INDICATION: Inadvertent removal chronic feeding gastrostomy tube. Patient presents today for attempted fluoroscopic guided replacement. EXAM: FLUOROSCOPIC GUIDED REPLACEMENT OF GASTROSTOMY TUBE COMPARISON:  CT abdomen and pelvis-12/24/2020 MEDICATIONS: None. CONTRAST:  108mL OMNIPAQUE IOHEXOL 240 MG/ML SOLN - administered into the gastric lumen FLUOROSCOPY TIME:  30 seconds (4 mGy) COMPLICATIONS: None immediate. PROCEDURE: Informed written  consent was obtained from the patient after a discussion of the risks, benefits and alternatives to treatment. Questions regarding the procedure were encouraged and answered. A timeout was performed prior to the initiation of the procedure. The upper abdomen and external portion of the site of the pre-existing gastrostomy tube was prepped and draped in the usual sterile fashion, and a sterile drape was applied covering the operative field. Maximum barrier sterile technique with sterile gowns and gloves were used for the procedure. A timeout was performed prior to the initiation of the procedure. A gastrostomy tube was cannulated with a short Amplatz wire. Contrast injection demonstrated patency of the chronic gastrostomy tube track. The Kumpe catheter was manipulated through the track to the level of the gastric lumen. Contrast injection confirmed appropriate positioning. Next, over a short Amplatz wire, the track was serially dilated ultimately allowing placement a new 18-French balloon inflatable gastrostomy tube. The balloon was inflated with saline and dilute contrast and pulled against the anterior inner lumen of the stomach and the external disc was cinched. Contrast was injected and a post procedural spot fluoroscopic image was obtained confirming appropriate positioning and functionality of the new gastrostomy tube. A dressing was applied. The patient tolerated the procedure well without immediate postprocedural complication. IMPRESSION: Successful fluoroscopic guided replacement of a new 18-French gastrostomy tube. The gastrostomy tube is ready for immediate use. Electronically Signed   By: Simonne Come M.D.   On: 07/05/2021 16:02    Procedures FEEDING TUBE REPLACEMENT  Date/Time: 07/05/2021 3:22 PM Performed by: Terrilee Files, MD Authorized by: Terrilee Files, MD  Consent: Verbal consent obtained. Consent given by: patient Patient understanding: patient states understanding of the procedure  being performed Patient identity confirmed: hospital-assigned identification number Time out: Immediately prior to procedure a "time out" was called to verify the correct patient, procedure, equipment, support staff and site/side marked as required. Indications: tube dislodged Local anesthesia used: no  Anesthesia: Local anesthesia used: no  Sedation: Patient sedated: no  Tube type: gastrostomy Patient position: supine Tube size: 18 Fr Endoscope used: no Patient tolerance: patient tolerated the procedure well with no immediate complications Comments: Unsuccessful.     Medications Ordered in ED Medications -  No data to display  ED Course  I have reviewed the triage vital signs and the nursing notes.  Pertinent labs & imaging results that were available during my care of the patient were reviewed by me and considered in my medical decision making (see chart for details).  Clinical Course as of 07/05/21 1855  Tue Jul 05, 2021  1245 Reached out to [facility] talk to his nurse.  She said it was an 77 French PEG with balloon. [MB]  1400 At the bedside I was unsuccessful in being able to place the G-tube.  Did not want to create a false tract. [MB]  1445 Discussed with Dr. Grace Isaac from interventional radiology.  He said put an order in and they can take him for G-tube replacement. [MB]    Clinical Course User Index [MB] Terrilee Files, MD   MDM Rules/Calculators/A&P                          Patient here with dislodged G-tube.  He is chronic trach vent dependent.  Wife says he has not had any of his medications since yesterday morning.  I was unable to replace tube due to narrowing of stoma site.  Interventional radiology consulted and they were able to dilate the stoma and replace feeding tube.  Patient will return back to his facility for continued rehabilitation.  Final Clinical Impression(s) / ED Diagnoses Final diagnoses:  Dislodged gastrostomy tube    Rx / DC  Orders ED Discharge Orders     None        Terrilee Files, MD 07/05/21 1859

## 2021-07-05 NOTE — ED Notes (Signed)
18 Fr peg tube at bedside. MD aware.

## 2021-07-05 NOTE — ED Notes (Signed)
Pt transported to IR via stretcher with RT and IR staff.

## 2021-07-05 NOTE — Discharge Instructions (Addendum)
You are seen in the emergency department for a displaced G-tube.  You had another tube placed by interventional radiology and the tube is okay to use.  Please contact interventional radiology if you have further difficulty with the G-tube.

## 2021-07-05 NOTE — ED Triage Notes (Signed)
Pt BIB [facility] from [facility] due to peg tube replacement. Pt is trached. Pt is hypertensive.

## 2021-07-07 LAB — CBG MONITORING, ED: Glucose-Capillary: 74 mg/dL (ref 70–99)

## 2022-02-07 IMAGING — CT CT ANGIO CHEST
2 of 7 series · 18 of 46 positions shown · IV contrast (omnipaque)
Comparison: Chest radiograph dated 01/26/2020.

CLINICAL DATA: 69-year-old male with shortness of breath.

EXAM:
CT ANGIOGRAPHY CHEST WITH CONTRAST
TECHNIQUE: Multidetector CT imaging of the chest was performed using the
standard protocol during bolus administration of intravenous
contrast. Multiplanar CT image reconstructions and MIPs were
obtained to evaluate the vascular anatomy.
CONTRAST:  65mL OMNIPAQUE IOHEXOL 350 MG/ML SOLN

[Series 7: thins · axial · 0.79mm/px · z∈[+1168,+1474]mm · 15 of 347 slices shown]
[im 20/347  lung]
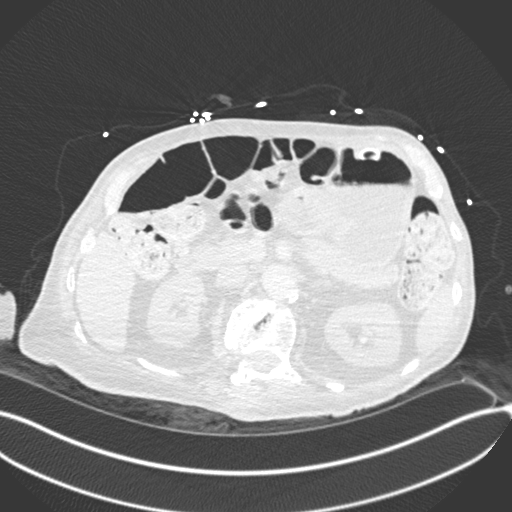
[im 39/347  soft-tissue]
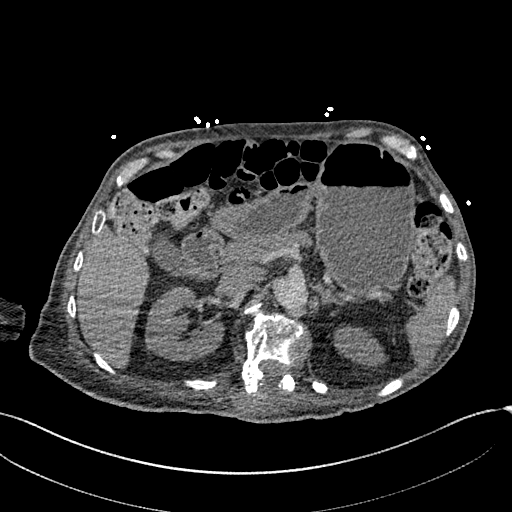
[im 58/347  lung]
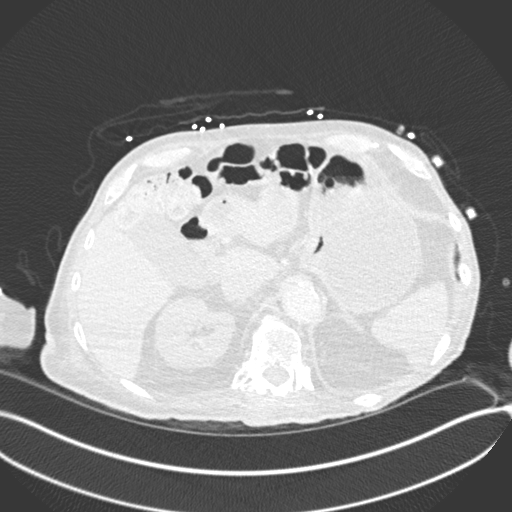
[im 77/347  soft-tissue]
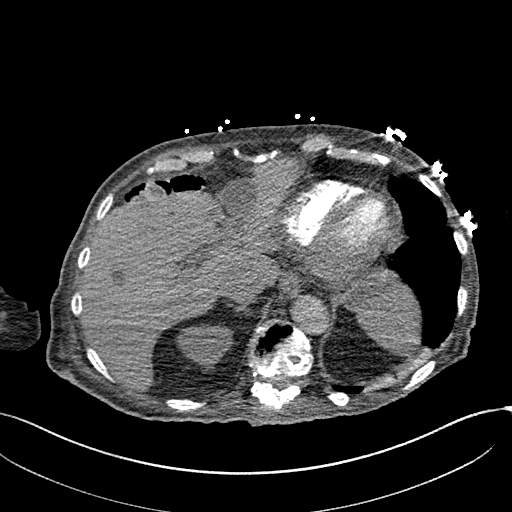
[im 116/347  lung]
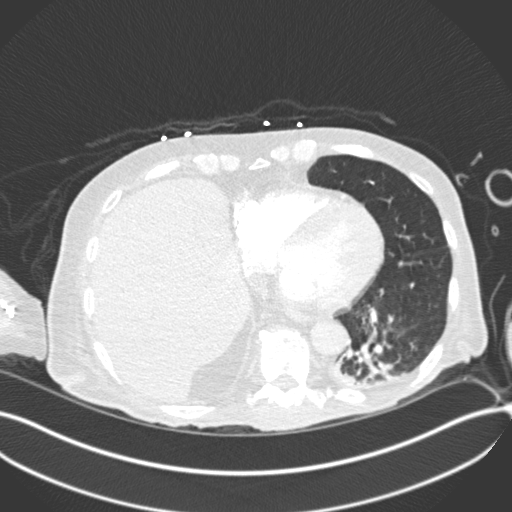
[im 135/347  soft-tissue]
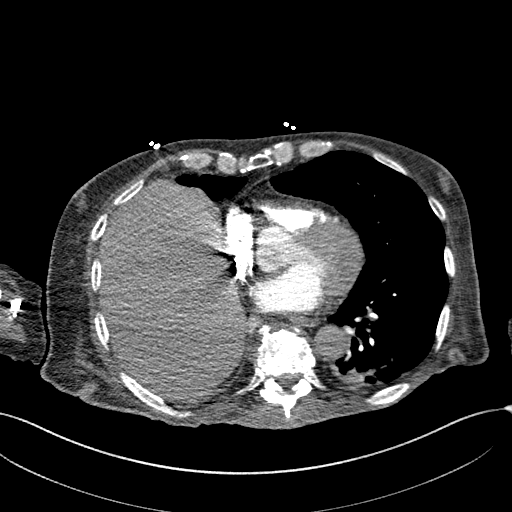
[im 154/347  lung]
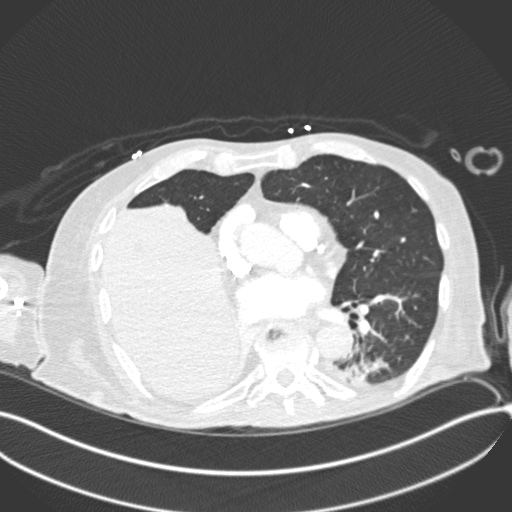
[im 174/347  soft-tissue]
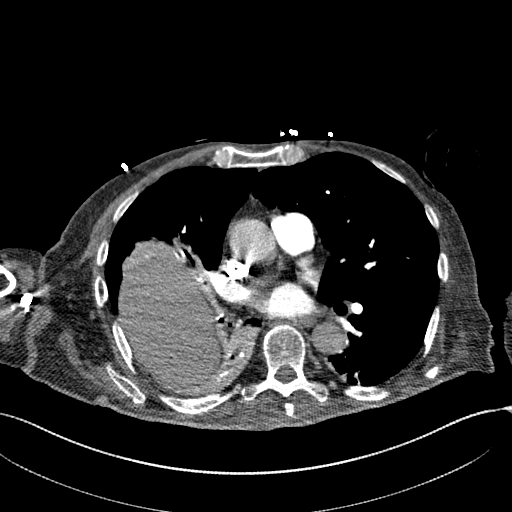
[im 193/347  lung]
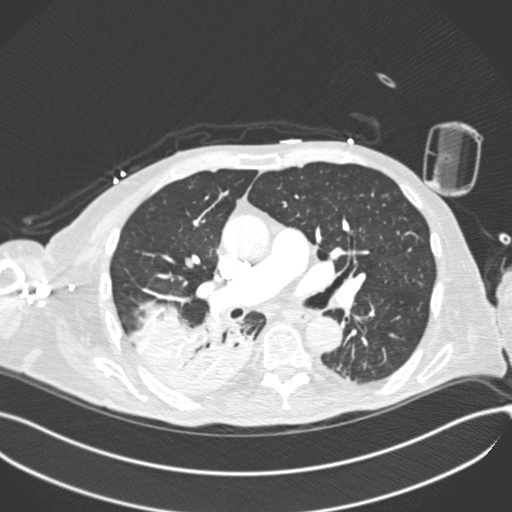
[im 212/347  soft-tissue]
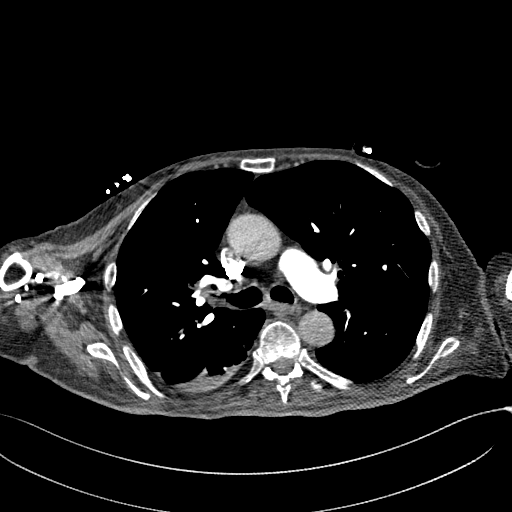
[im 231/347  lung]
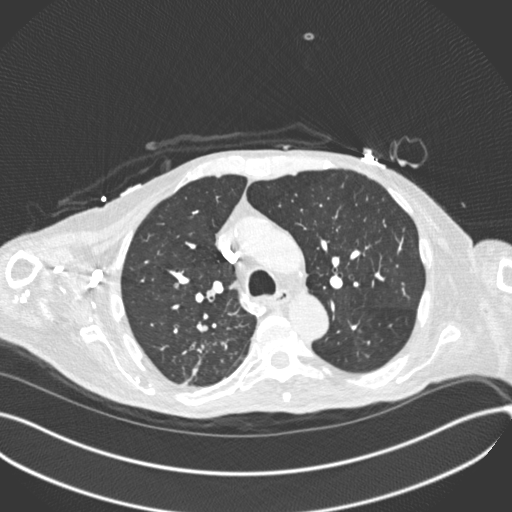
[im 270/347  soft-tissue]
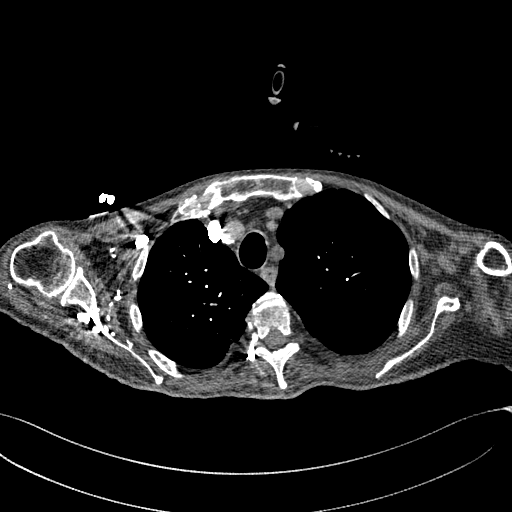
[im 289/347  lung]
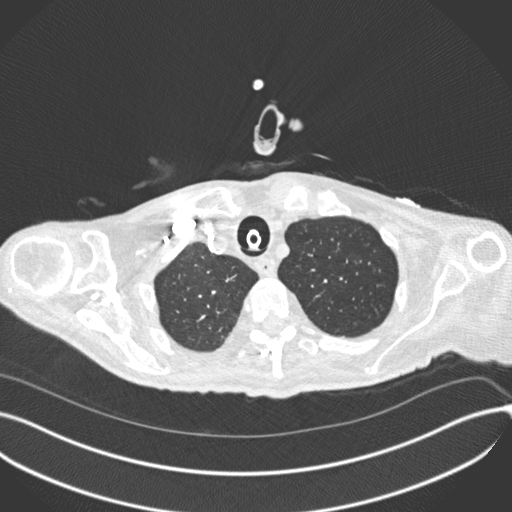
[im 308/347  soft-tissue]
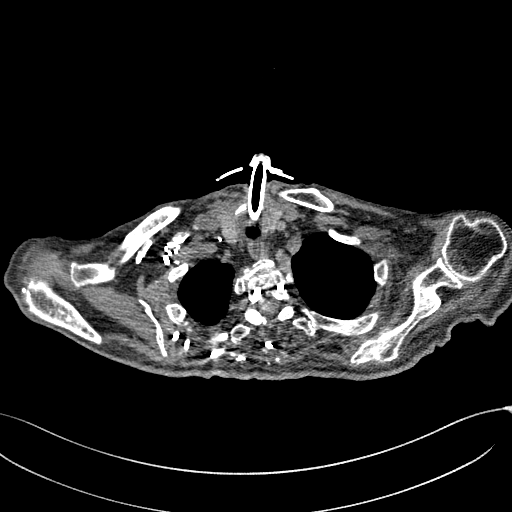
[im 327/347  lung]
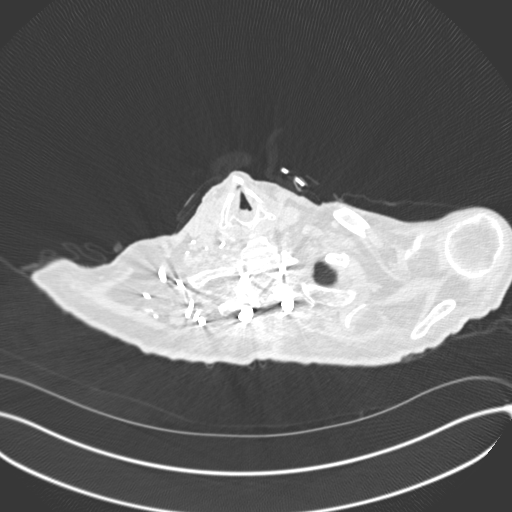

[Series 9: coronal mpr · coronal · 0.68mm/px · 3 of 150 slices shown]
[im 38/150  soft-tissue]
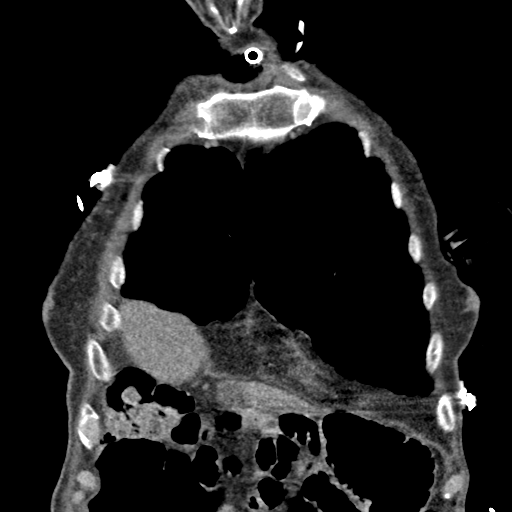
[im 75/150  soft-tissue]
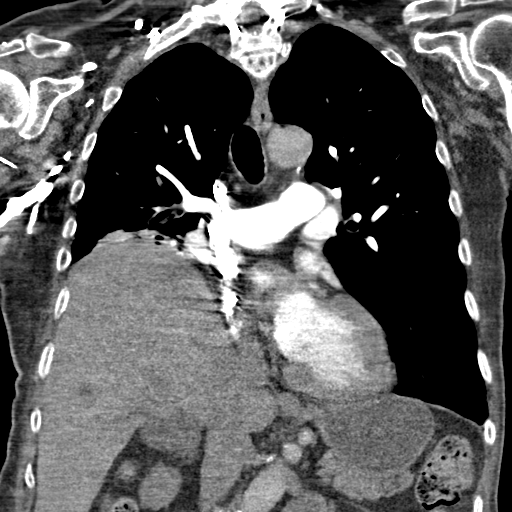
[im 112/150  soft-tissue]
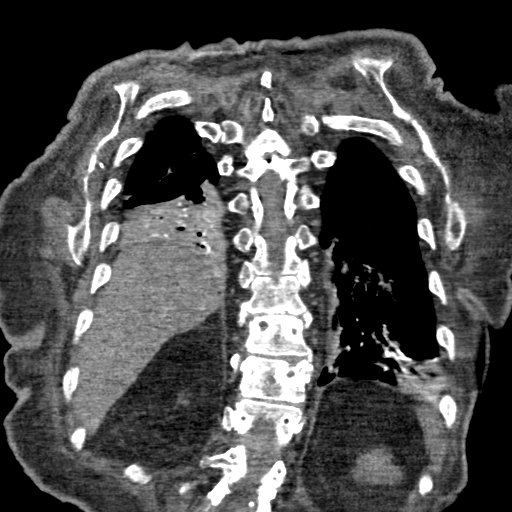

[18 of 46 positions shown; findings below may reference images not displayed]

FINDINGS: Cardiovascular: There is no cardiomegaly or pericardial effusion.
Coronary vascular calcification primarily involving the LAD. There
is mild atherosclerotic calcification of the thoracic aorta.
Evaluation of the pulmonary arteries is somewhat limited due to
respiratory motion artifact. No pulmonary artery embolus identified.

Mediastinum/Nodes: No definite hilar or mediastinal adenopathy.
Evaluation however is limited due to consolidative changes of the
right lower lobe. The esophagus is grossly unremarkable. No
mediastinal fluid collection.

Lungs/Pleura: There is eventration of the right hemidiaphragm. There
is a large area of consolidative change with air bronchogram
involving the right lower lobe and to a lesser degree right middle
lobe. There are clusters of nodular density with tree-in-bud
appearance in the right upper lobe. Findings most consistent with
multifocal pneumonia and possibly related to aspiration. Clinical
correlation recommended. There is background of mild centrilobular
emphysema. Linear streaky densities as well as faint nodular
densities in the left lower lobe also likely infectious in etiology.
There is no pleural effusion or pneumothorax. Mucus secretion noted
along the posterior wall of the trachea extending into the mainstem
bronchi bilaterally. There is a tracheostomy with tip above the
carina. The central airway remain patent.

Upper Abdomen: Several hepatic hypodense lesions which are not well
characterized on this CT. A gastrostomy is noted.

Musculoskeletal: Osteopenia with degenerative changes of the spine.
T1-T2 posterior fusion as well as evidence of prior lower thoracic
posterior fusion. No acute osseous pathology.

Review of the MIP images confirms the above findings.
IMPRESSION: 1. No CT evidence of pulmonary artery embolus.
2. Multifocal pneumonia including an area of consolidative change in
the right lung base, possibly related to aspiration. Clinical
correlation is recommended.
3. Aortic Atherosclerosis (W7TOI-TDS.S) and Emphysema (W7TOI-9BY.F).

## 2022-06-15 ENCOUNTER — Other Ambulatory Visit: Payer: Self-pay

## 2022-06-15 ENCOUNTER — Emergency Department (HOSPITAL_COMMUNITY): Payer: Medicare HMO

## 2022-06-15 ENCOUNTER — Encounter (HOSPITAL_COMMUNITY): Payer: Self-pay | Admitting: Emergency Medicine

## 2022-06-15 ENCOUNTER — Inpatient Hospital Stay (HOSPITAL_COMMUNITY)
Admission: EM | Admit: 2022-06-15 | Discharge: 2022-06-21 | DRG: 207 | Disposition: A | Discharge status: Other Acute Inpatient Hospital | Payer: Medicare HMO | Source: Other Acute Inpatient Hospital | Attending: Internal Medicine | Admitting: Internal Medicine

## 2022-06-15 DIAGNOSIS — Z931 Gastrostomy status: Secondary | ICD-10-CM | POA: Diagnosis not present

## 2022-06-15 DIAGNOSIS — R404 Transient alteration of awareness: Secondary | ICD-10-CM | POA: Diagnosis not present

## 2022-06-15 DIAGNOSIS — G7281 Critical illness myopathy: Secondary | ICD-10-CM | POA: Diagnosis present

## 2022-06-15 DIAGNOSIS — G8100 Flaccid hemiplegia affecting unspecified side: Secondary | ICD-10-CM | POA: Diagnosis not present

## 2022-06-15 DIAGNOSIS — Z9911 Dependence on respirator [ventilator] status: Secondary | ICD-10-CM | POA: Diagnosis not present

## 2022-06-15 DIAGNOSIS — J9611 Chronic respiratory failure with hypoxia: Secondary | ICD-10-CM

## 2022-06-15 DIAGNOSIS — N390 Urinary tract infection, site not specified: Secondary | ICD-10-CM | POA: Diagnosis present

## 2022-06-15 DIAGNOSIS — B9620 Unspecified Escherichia coli [E. coli] as the cause of diseases classified elsewhere: Secondary | ICD-10-CM | POA: Diagnosis present

## 2022-06-15 DIAGNOSIS — Z800 Family history of malignant neoplasm of digestive organs: Secondary | ICD-10-CM

## 2022-06-15 DIAGNOSIS — M549 Dorsalgia, unspecified: Secondary | ICD-10-CM | POA: Diagnosis present

## 2022-06-15 DIAGNOSIS — J9612 Chronic respiratory failure with hypercapnia: Secondary | ICD-10-CM | POA: Diagnosis present

## 2022-06-15 DIAGNOSIS — I959 Hypotension, unspecified: Secondary | ICD-10-CM | POA: Diagnosis not present

## 2022-06-15 DIAGNOSIS — R253 Fasciculation: Secondary | ICD-10-CM | POA: Diagnosis not present

## 2022-06-15 DIAGNOSIS — Z79899 Other long term (current) drug therapy: Secondary | ICD-10-CM | POA: Diagnosis not present

## 2022-06-15 DIAGNOSIS — R532 Functional quadriplegia: Secondary | ICD-10-CM | POA: Diagnosis present

## 2022-06-15 DIAGNOSIS — Z7401 Bed confinement status: Secondary | ICD-10-CM | POA: Diagnosis not present

## 2022-06-15 DIAGNOSIS — R531 Weakness: Secondary | ICD-10-CM | POA: Diagnosis not present

## 2022-06-15 DIAGNOSIS — Z930 Tracheostomy status: Secondary | ICD-10-CM

## 2022-06-15 DIAGNOSIS — Z87891 Personal history of nicotine dependence: Secondary | ICD-10-CM | POA: Diagnosis not present

## 2022-06-15 DIAGNOSIS — G894 Chronic pain syndrome: Secondary | ICD-10-CM | POA: Diagnosis present

## 2022-06-15 DIAGNOSIS — J150 Pneumonia due to Klebsiella pneumoniae: Secondary | ICD-10-CM | POA: Diagnosis present

## 2022-06-15 DIAGNOSIS — R8281 Pyuria: Secondary | ICD-10-CM | POA: Diagnosis not present

## 2022-06-15 DIAGNOSIS — Z885 Allergy status to narcotic agent status: Secondary | ICD-10-CM | POA: Diagnosis not present

## 2022-06-15 DIAGNOSIS — R4182 Altered mental status, unspecified: Secondary | ICD-10-CM | POA: Diagnosis present

## 2022-06-15 DIAGNOSIS — L98429 Non-pressure chronic ulcer of back with unspecified severity: Secondary | ICD-10-CM | POA: Diagnosis present

## 2022-06-15 DIAGNOSIS — Z20822 Contact with and (suspected) exposure to covid-19: Secondary | ICD-10-CM | POA: Diagnosis present

## 2022-06-15 DIAGNOSIS — R739 Hyperglycemia, unspecified: Secondary | ICD-10-CM | POA: Diagnosis not present

## 2022-06-15 DIAGNOSIS — R8271 Bacteriuria: Secondary | ICD-10-CM | POA: Diagnosis not present

## 2022-06-15 DIAGNOSIS — L89151 Pressure ulcer of sacral region, stage 1: Secondary | ICD-10-CM | POA: Diagnosis present

## 2022-06-15 DIAGNOSIS — J9610 Chronic respiratory failure, unspecified whether with hypoxia or hypercapnia: Secondary | ICD-10-CM | POA: Diagnosis not present

## 2022-06-15 DIAGNOSIS — R231 Pallor: Secondary | ICD-10-CM | POA: Diagnosis not present

## 2022-06-15 DIAGNOSIS — Z888 Allergy status to other drugs, medicaments and biological substances status: Secondary | ICD-10-CM | POA: Diagnosis not present

## 2022-06-15 DIAGNOSIS — D72829 Elevated white blood cell count, unspecified: Secondary | ICD-10-CM | POA: Diagnosis not present

## 2022-06-15 DIAGNOSIS — J189 Pneumonia, unspecified organism: Secondary | ICD-10-CM | POA: Diagnosis not present

## 2022-06-15 LAB — COMPREHENSIVE METABOLIC PANEL
ALT: 46 U/L — ABNORMAL HIGH (ref 0–44)
AST: 51 U/L — ABNORMAL HIGH (ref 15–41)
Albumin: 2.8 g/dL — ABNORMAL LOW (ref 3.5–5.0)
Alkaline Phosphatase: 124 U/L (ref 38–126)
Anion gap: 10 (ref 5–15)
BUN: 19 mg/dL (ref 8–23)
CO2: 28 mmol/L (ref 22–32)
Calcium: 9 mg/dL (ref 8.9–10.3)
Chloride: 97 mmol/L — ABNORMAL LOW (ref 98–111)
Creatinine, Ser: <0.3 mg/dL — ABNORMAL LOW (ref 0.61–1.24)
Glucose, Bld: 176 mg/dL — ABNORMAL HIGH (ref 70–99)
Potassium: 3.7 mmol/L (ref 3.5–5.1)
Sodium: 135 mmol/L (ref 135–145)
Total Bilirubin: 0.4 mg/dL (ref 0.3–1.2)
Total Protein: 7.1 g/dL (ref 6.5–8.1)

## 2022-06-15 LAB — RESPIRATORY PANEL BY PCR

## 2022-06-15 LAB — CBC WITH DIFFERENTIAL/PLATELET
Abs Immature Granulocytes: 0.1 10*3/uL — ABNORMAL HIGH (ref 0.00–0.07)
Basophils Absolute: 0 10*3/uL (ref 0.0–0.1)
Basophils Relative: 0 %
Eosinophils Absolute: 0 10*3/uL (ref 0.0–0.5)
Eosinophils Relative: 0 %
HCT: 38.1 % — ABNORMAL LOW (ref 39.0–52.0)
Hemoglobin: 12.5 g/dL — ABNORMAL LOW (ref 13.0–17.0)
Immature Granulocytes: 1 %
Lymphocytes Relative: 4 %
Lymphs Abs: 0.6 10*3/uL — ABNORMAL LOW (ref 0.7–4.0)
MCH: 32.2 pg (ref 26.0–34.0)
MCHC: 32.8 g/dL (ref 30.0–36.0)
MCV: 98.2 fL (ref 80.0–100.0)
Monocytes Absolute: 1 10*3/uL (ref 0.1–1.0)
Monocytes Relative: 7 %
Neutro Abs: 13.1 10*3/uL — ABNORMAL HIGH (ref 1.7–7.7)
Neutrophils Relative %: 88 %
Platelets: 298 10*3/uL (ref 150–400)
RBC: 3.88 MIL/uL — ABNORMAL LOW (ref 4.22–5.81)
RDW: 13.7 % (ref 11.5–15.5)
WBC: 14.8 10*3/uL — ABNORMAL HIGH (ref 4.0–10.5)
nRBC: 0 % (ref 0.0–0.2)

## 2022-06-15 LAB — URINALYSIS, ROUTINE W REFLEX MICROSCOPIC
Bilirubin Urine: NEGATIVE
Glucose, UA: NEGATIVE mg/dL
Ketones, ur: NEGATIVE mg/dL
Nitrite: NEGATIVE
Protein, ur: 100 mg/dL — AB
RBC / HPF: >50 RBC/hpf — ABNORMAL HIGH (ref 0–5)
Specific Gravity, Urine: 1.015 (ref 1.005–1.030)
WBC, UA: >50 WBC/hpf — ABNORMAL HIGH (ref 0–5)
pH: 5 (ref 5.0–8.0)

## 2022-06-15 LAB — I-STAT ARTERIAL BLOOD GAS, ED
Acid-Base Excess: 5 mmol/L — ABNORMAL HIGH (ref 0.0–2.0)
Bicarbonate: 31.9 mmol/L — ABNORMAL HIGH (ref 20.0–28.0)
Calcium, Ion: 1.24 mmol/L (ref 1.15–1.40)
HCT: 35 % — ABNORMAL LOW (ref 39.0–52.0)
Hemoglobin: 11.9 g/dL — ABNORMAL LOW (ref 13.0–17.0)
O2 Saturation: 99 %
Patient temperature: 99.1
Potassium: 3.8 mmol/L (ref 3.5–5.1)
Sodium: 134 mmol/L — ABNORMAL LOW (ref 135–145)
TCO2: 34 mmol/L — ABNORMAL HIGH (ref 22–32)
pCO2 arterial: 57.1 mmHg — ABNORMAL HIGH (ref 32–48)
pH, Arterial: 7.356 (ref 7.35–7.45)
pO2, Arterial: 161 mmHg — ABNORMAL HIGH (ref 83–108)

## 2022-06-15 LAB — LACTIC ACID, PLASMA: Lactic Acid, Venous: 1.2 mmol/L (ref 0.5–1.9)

## 2022-06-15 LAB — PROCALCITONIN: Procalcitonin: 1.41 ng/mL

## 2022-06-15 LAB — MRSA NEXT GEN BY PCR, NASAL: MRSA by PCR Next Gen: DETECTED — AB

## 2022-06-15 MED ORDER — LACTULOSE 10 GM/15ML PO SOLN: 20.0000 g | Freq: Two times a day (BID) | ORAL | Status: DC | PRN

## 2022-06-15 MED ORDER — LORAZEPAM 0.5 MG PO TABS: 0.5000 mg | ORAL_TABLET | Freq: Two times a day (BID) | ORAL | Status: DC | PRN

## 2022-06-15 MED ORDER — LACTATED RINGERS IV BOLUS
1000.0000 mL | Freq: Once | INTRAVENOUS | Status: AC
  Administered 2022-06-15: 975 mL via INTRAVENOUS

## 2022-06-15 MED ORDER — SENNOSIDES 8.8 MG/5ML PO SYRP
10.0000 mL | ORAL_SOLUTION | Freq: Two times a day (BID) | ORAL | Status: DC
  Administered 2022-06-16 – 2022-06-21 (×11): 10 mL
  Filled 2022-06-15 (×13): qty 10

## 2022-06-15 MED ORDER — FERROUS SULFATE 300 (60 FE) MG/5ML PO SYRP
300.0000 mg | ORAL_SOLUTION | ORAL | Status: DC
Start: 2022-06-16 — End: 2022-06-21
  Administered 2022-06-16 – 2022-06-20 (×3): 300 mg
  Filled 2022-06-15 (×3): qty 5

## 2022-06-15 MED ORDER — PANTOPRAZOLE SODIUM 40 MG IV SOLR
40.0000 mg | Freq: Every day | INTRAVENOUS | Status: DC
  Administered 2022-06-16: 40 mg via INTRAVENOUS
  Filled 2022-06-15: qty 10

## 2022-06-15 MED ORDER — BETHANECHOL CHLORIDE 10 MG PO TABS
10.0000 mg | ORAL_TABLET | Freq: Three times a day (TID) | ORAL | Status: DC
  Administered 2022-06-16 – 2022-06-21 (×17): 10 mg
  Filled 2022-06-15 (×19): qty 1

## 2022-06-15 MED ORDER — ACETAMINOPHEN 160 MG/5ML PO SOLN
960.0000 mg | Freq: Three times a day (TID) | ORAL | Status: DC
Start: 2022-06-15 — End: 2022-06-21
  Administered 2022-06-16 – 2022-06-21 (×18): 960 mg
  Filled 2022-06-15 (×18): qty 40.6

## 2022-06-15 MED ORDER — DOCUSATE SODIUM 50 MG/5ML PO LIQD
100.0000 mg | Freq: Two times a day (BID) | ORAL | Status: DC
Start: 2022-06-15 — End: 2022-06-21
  Administered 2022-06-16 – 2022-06-21 (×11): 100 mg
  Filled 2022-06-15 (×11): qty 10

## 2022-06-15 MED ORDER — VANCOMYCIN VARIABLE DOSE PER UNSTABLE RENAL FUNCTION (PHARMACIST DOSING): Status: DC

## 2022-06-15 MED ORDER — POLYVINYL ALCOHOL 1.4 % OP SOLN
1.0000 [drp] | Freq: Three times a day (TID) | OPHTHALMIC | Status: DC
  Administered 2022-06-16 – 2022-06-21 (×16): 1 [drp] via OPHTHALMIC
  Filled 2022-06-15: qty 15

## 2022-06-15 MED ORDER — MIDODRINE HCL 5 MG PO TABS
10.0000 mg | ORAL_TABLET | Freq: Three times a day (TID) | ORAL | Status: DC
  Administered 2022-06-16 – 2022-06-17 (×6): 10 mg
  Filled 2022-06-15 (×6): qty 2

## 2022-06-15 MED ORDER — POLYETHYLENE GLYCOL 3350 17 G PO PACK
17.0000 g | PACK | Freq: Two times a day (BID) | ORAL | Status: DC
  Administered 2022-06-16 – 2022-06-21 (×11): 17 g
  Filled 2022-06-15 (×11): qty 1

## 2022-06-15 MED ORDER — MELATONIN 5 MG PO TABS
10.0000 mg | ORAL_TABLET | Freq: Every evening | ORAL | Status: DC | PRN
  Administered 2022-06-16 – 2022-06-17 (×2): 10 mg
  Filled 2022-06-15 (×2): qty 2

## 2022-06-15 MED ORDER — VANCOMYCIN HCL 1750 MG/350ML IV SOLN
1750.0000 mg | Freq: Once | INTRAVENOUS | Status: AC
  Administered 2022-06-15: 1750 mg via INTRAVENOUS
  Filled 2022-06-15: qty 350

## 2022-06-15 MED ORDER — HYDROMORPHONE HCL 2 MG PO TABS
1.0000 mg | ORAL_TABLET | Freq: Three times a day (TID) | ORAL | Status: DC | PRN
  Administered 2022-06-17 – 2022-06-21 (×3): 1 mg
  Filled 2022-06-15 (×4): qty 1

## 2022-06-15 MED ORDER — SCOPOLAMINE 1 MG/3DAYS TD PT72
1.0000 | MEDICATED_PATCH | TRANSDERMAL | Status: DC
  Administered 2022-06-16 – 2022-06-19 (×2): 1.5 mg via TRANSDERMAL
  Filled 2022-06-15 (×2): qty 1

## 2022-06-15 MED ORDER — RIVAROXABAN 10 MG PO TABS
10.0000 mg | ORAL_TABLET | Freq: Every day | ORAL | Status: DC
  Administered 2022-06-16 – 2022-06-21 (×6): 10 mg
  Filled 2022-06-15 (×6): qty 1

## 2022-06-15 MED ORDER — LORATADINE 10 MG PO TABS
10.0000 mg | ORAL_TABLET | Freq: Every evening | ORAL | Status: DC
Start: 2022-06-15 — End: 2022-06-18
  Administered 2022-06-16: 10 mg
  Filled 2022-06-15 (×2): qty 1

## 2022-06-15 MED ORDER — PREGABALIN 50 MG PO CAPS
100.0000 mg | ORAL_CAPSULE | Freq: Three times a day (TID) | ORAL | Status: DC
  Administered 2022-06-16 – 2022-06-21 (×17): 100 mg
  Filled 2022-06-15 (×13): qty 2
  Filled 2022-06-15: qty 4
  Filled 2022-06-15 (×3): qty 2

## 2022-06-15 MED ORDER — OSMOLITE 1.5 CAL PO LIQD
1000.0000 mL | ORAL | Status: DC
Start: 2022-06-15 — End: 2022-06-16
  Administered 2022-06-16: 1000 mL
  Filled 2022-06-15: qty 1000

## 2022-06-15 MED ORDER — HYDROXYZINE HCL 25 MG PO TABS
25.0000 mg | ORAL_TABLET | Freq: Three times a day (TID) | ORAL | Status: DC
  Administered 2022-06-16 – 2022-06-18 (×8): 25 mg
  Filled 2022-06-15 (×8): qty 1

## 2022-06-15 MED ORDER — ZINC OXIDE 20 % EX OINT: 1.0000 | TOPICAL_OINTMENT | CUTANEOUS | Status: DC | PRN

## 2022-06-15 MED ORDER — BIOTENE ORALBALANCE DRY MOUTH MT GEL
1.0000 | OROMUCOSAL | Status: DC | PRN
Start: 2022-06-15 — End: 2022-06-15

## 2022-06-15 MED ORDER — SODIUM CHLORIDE 0.9 % IV SOLN
2.0000 g | Freq: Once | INTRAVENOUS | Status: AC
  Administered 2022-06-15: 2 g via INTRAVENOUS
  Filled 2022-06-15: qty 12.5

## 2022-06-15 MED ORDER — SODIUM CHLORIDE 0.9 % IV SOLN
2.0000 g | Freq: Once | INTRAVENOUS | Status: AC
  Administered 2022-06-16: 2 g via INTRAVENOUS
  Filled 2022-06-15: qty 12.5

## 2022-06-15 MED ORDER — NUTRISOURCE FIBER PO PACK
1.0000 | PACK | Freq: Two times a day (BID) | ORAL | Status: DC
  Administered 2022-06-16 – 2022-06-21 (×11): 1
  Filled 2022-06-15 (×11): qty 1

## 2022-06-15 MED ORDER — ESCITALOPRAM OXALATE 10 MG PO TABS
10.0000 mg | ORAL_TABLET | Freq: Every day | ORAL | Status: DC
  Administered 2022-06-16 – 2022-06-21 (×6): 10 mg
  Filled 2022-06-15 (×6): qty 1

## 2022-06-15 MED ORDER — ENOXAPARIN SODIUM 40 MG/0.4ML IJ SOSY: 40.0000 mg | PREFILLED_SYRINGE | INTRAMUSCULAR | Status: DC

## 2022-06-15 MED ORDER — IPRATROPIUM-ALBUTEROL 0.5-2.5 (3) MG/3ML IN SOLN
3.0000 mL | RESPIRATORY_TRACT | Status: DC | PRN
Start: 2022-06-15 — End: 2022-06-21

## 2022-06-15 MED ORDER — FENTANYL 25 MCG/HR TD PT72
1.0000 | MEDICATED_PATCH | TRANSDERMAL | Status: DC
  Administered 2022-06-16 – 2022-06-19 (×2): 1 via TRANSDERMAL
  Filled 2022-06-15 (×2): qty 1

## 2022-06-15 NOTE — ED Provider Notes (Signed)
71 year old male who is trach vent dependent, quadriparesis who presents the emergency department with altered mental status.  The patient's wife is at bedside who notes that he has been "off" for several days.  Upon arrival, per prior team patient was GCS of 4 - 1 - 1 upon arrival.  He had spontaneously improved and is back to baseline of GCS 15 without intervention.  There is concern by prior team that he is experiencing subclinical seizure versus sepsis.  Work-up thus far has been remarkable for urinary tract infection in addition to bibasilar atelectasis versus pneumonia.  The patient is been given broad-spectrum antibiotics. Physical Exam  BP 119/64   Pulse 97   Temp 99.1 F (37.3 C) (Rectal)   Resp (!) 21   Ht 5\' 8"  (1.727 m)   SpO2 100%   BMI 28.49 kg/m   Physical Exam Vitals and nursing note reviewed.  Constitutional:      General: He is not in acute distress.    Appearance: He is well-developed.     Comments: Upon entering the exam room where the patient appears chronically not acutely ill.  He is tracking providers throughout the room and is reportedly at baseline per wife at bedside  HENT:     Head: Normocephalic and atraumatic.  Eyes:     Conjunctiva/sclera: Conjunctivae normal.  Cardiovascular:     Rate and Rhythm: Normal rate and regular rhythm.     Heart sounds: No murmur heard. Pulmonary:     Effort: Pulmonary effort is normal. No respiratory distress.     Breath sounds: Normal breath sounds.  Abdominal:     Palpations: Abdomen is soft.     Tenderness: There is no abdominal tenderness.  Musculoskeletal:        General: No swelling.     Cervical back: Neck supple.  Skin:    General: Skin is warm and dry.     Capillary Refill: Capillary refill takes less than 2 seconds.  Neurological:     Mental Status: He is alert.     Comments: Patient will communicate by mouthing words.  He is fully alert and oriented at this time.  Psychiatric:        Mood and Affect: Mood  normal.     Procedures  Procedures  ED Course / MDM   Clinical Course as of 06/15/22 1624  Thu Jun 15, 2022  1616 Trach and vent dependent, g tube, quad, GCS 6 upon presentation 4-1-1, no gag reflex, now back to baseline. Initially tachycardic --> septic workup, ? PNA on XR, got vanc and cefepime,  [JG]    Clinical Course User Index [JG] Jun 17, 2022, MD   Medical Decision Making Amount and/or Complexity of Data Reviewed Labs: ordered. Radiology: ordered.  Risk Prescription drug management.   Patient was initially altered to a GCS of 6.  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  Patient was empirically treated by team for sepsis.  Neurology was consulted and recommended spot EEG and MRI.  Patient was ultimately admitted to medicine in hemodynamically stable condition for work-up of the patient's transient altered mentation and continued septic work-up.      Duard Brady, MD 06/15/22 06/17/22    [id number], MD 06/15/22 [phone]

## 2022-06-15 NOTE — Progress Notes (Signed)
Tech waited in pt room for close to 30 mins. Admitting doctors were still asking pt questions. A STAT MRI was ordered. Holding off on STAT routine until after MRI in case patient needs to be prolonged.

## 2022-06-15 NOTE — Consult Note (Signed)
NAME:  Andrew Rivera, MRN:  [id number], DOB:  [birth year], LOS: 0 ADMISSION DATE:  06/15/2022, CONSULTATION DATE: 06/15/2022 REFERRING MD: Dr. 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  Resides at [facility].  He was brought to the ED 8/24 with flushing, diaphoresis and altered mental status.  He apparently experienced a staring spell that was witnessed.  Initially poorly responsive following the spell and then slowly return to normal mental status.  Evaluation in the ED noted leukocytosis, pyuria.  He had some increased green secretions with question bronchitis versus pneumonia.  He has been placed on antibiotics and evaluation, cultures ongoing.  Neurology has recommended EEG, MRI brain.  PCCM consulted regarding his tracheostomy and ventilator dependence.  Pertinent  Medical History   Past Medical History:  Diagnosis Date   Acute on chronic respiratory failure with hypoxia (HCC)    Altered mental status, unspecified    Chronic pain syndrome    Critical illness myopathy    Internal jugular (IJ) vein thromboembolism, acute, unspecified laterality (HCC)    Septic shock (HCC)     Significant Hospital Events: Including procedures, antibiotic start and stop dates in addition to other pertinent events     Interim History / Subjective:  States that he feels back to normal.  Tells me that he is on 0.40, PEEP 5 at baseline at [facility].  He does not do any time off of MV, does not do speaking valve or take p.o.  Objective   Blood pressure (!) 108/54, pulse 90, temperature 99.9 F (37.7 C), temperature source Oral, resp. rate (!) 21, height 5\' 8"  (1.727 m), weight 77.6 kg, SpO2 98 %.    Vent Mode: PRVC FiO2 (%):  [40 %-50 %] 40 % Set Rate:  [18 bmp] 18 bmp Vt Set:  [540 mL] 540  mL PEEP:  [5 cmH20] 5 cmH20 Plateau Pressure:  [19 cmH20-20 cmH20] 19 cmH20   Intake/Output Summary (Last 24 hours) at 06/15/2022 2227 Last data filed at 06/15/2022 1733 Gross per 24 hour  Intake 1350.61 ml  Output 300 ml  Net 1050.61 ml   Filed Weights   06/15/22 1727  Weight: 77.6 kg    Examination: General: Ill-appearing man laying in bed in no distress.  Ventilated HENT: Oropharynx clear, pupils equal Lungs: Clear bilaterally, decreased to both bases without any wheezing Cardiovascular: Regular, distant, no murmur Abdomen: Nondistended, positive bowel sounds, PEG in place Extremities: Normal tone, no deformities Neuro: He is awake, interacts, attempt to speak.  Globally weak but can move his upper extremities, toes.  Weak cough GU: Deferred  Resolved Hospital Problem list     Assessment & Plan:  Chronic ventilator dependence apparently due to neuromuscular weakness, respiratory muscle weakness.  Cause not entirely clear.  Is being evaluated for phrenic nerve palsies.  Question whether this is the sequela of severe critical illness polymyopathy.  States that he is on 0.40, PEEP 5 as his baseline settings. -PRVC 8 cc/kg, 0.40, PEEP 5 -Obtain respiratory culture -Antibiotics as per the IMTS -Standard tracheostomy care, minimize suctioning as able -Question whether we may be able to move to PSV with a high PS.  Unclear to me how much ventilator weaning has been attempted, however recently.  Other issues, per IMTS: -suspected UTI -possible bronchitis/PNA -staring spell, ? Seizure -pressure ulcers   Labs  CBC: Recent Labs  Lab 06/15/22 1341 06/15/22 1514  WBC 14.8*  --   NEUTROABS 13.1*  --   HGB 12.5* 11.9*  HCT 38.1* 35.0*  MCV 98.2  --   PLT 298  --     Basic Metabolic Panel: Recent Labs  Lab 06/15/22 1341 06/15/22 1514  NA 135 134*  K 3.7 3.8  CL 97*  --   CO2 28  --   GLUCOSE 176*  --   BUN 19  --   CREATININE <0.30*  --   CALCIUM 9.0  --     GFR: CrCl cannot be calculated (This lab value cannot be used to calculate CrCl because it is not a number: <0.30). Recent Labs  Lab 06/15/22 1341 06/15/22 2053  PROCALCITON  --  1.41  WBC 14.8*  --   LATICACIDVEN 1.2  --     Liver Function Tests: Recent Labs  Lab 06/15/22 1341  AST 51*  ALT 46*  ALKPHOS 124  BILITOT 0.4  PROT 7.1  ALBUMIN 2.8*   No results for input(s): "LIPASE", "AMYLASE" in the last 168 hours. No results for input(s): "AMMONIA" in the last 168 hours.  ABG    Component Value Date/Time   PHART 7.356 06/15/2022 1514   PCO2ART 57.1 (H) 06/15/2022 1514   PO2ART 161 (H) 06/15/2022 1514   HCO3 31.9 (H) 06/15/2022 1514   TCO2 34 (H) 06/15/2022 1514   O2SAT 99 06/15/2022 1514     Coagulation Profile: No results for input(s): "INR", "PROTIME" in the last 168 hours.  Cardiac Enzymes: No results for input(s): "CKTOTAL", "CKMB", "CKMBINDEX", "TROPONINI" in the last 168 hours.  HbA1C: No results found for: "HGBA1C"  CBG: No results for input(s): "GLUCAP" in the last 168 hours.  Review of Systems:   As per HPi  Past Medical History:  He,  has a past medical history of Acute on chronic respiratory failure with hypoxia ([place]), Altered mental status, unspecified, Chronic pain syndrome, Critical illness myopathy, Internal jugular (IJ) vein thromboembolism, acute, unspecified laterality ([place]), and Septic shock ([place]).   Surgical History:   Past Surgical History:  Procedure Laterality Date   IR [facility] GASTRO/COLONIC TUBE PERCUT W/FLUORO  07/05/2021     Social History:   reports that he quit smoking about 32 years ago. His smoking use included cigarettes. He smoked an average of 1 pack per day. He has never used smokeless tobacco. He reports that he does not currently use alcohol. He reports that he does not use drugs.   Family History:  His family history is not on file.   Allergies Allergies  Allergen Reactions   Chlorhexidine Itching and Rash    Codeine Nausea Only   Oxycodone-Acetaminophen Nausea Only    Other reaction(s): Hallucinations     Home Medications  Prior to Admission medications   Medication Sig Start Date End Date Taking? Authorizing Provider  acetaminophen (TYLENOL) 325 MG tablet Take 975 mg by mouth 3 (three) times daily. 11/14/21  Yes [provider]  Artificial Saliva (BIOTENE ORALBALANCE DRY MOUTH) GEL Use as directed 1 Application in the mouth or throat as needed (dry mouth and throat).   Yes [provider]  bethanechol (URECHOLINE) 10 MG tablet Place 10 mg into feeding tube 3 (three) times daily.   Yes [provider]  Carboxymethylcellulose Sodium 1 % GEL Place 1 drop into both eyes in the morning, at noon, and at bedtime.   Yes [provider]  docusate sodium (COLACE) 100  MG capsule Take 1 capsule (100 mg total) by mouth 2 (two) times daily as needed for mild constipation. 06/30/21  Yes Eliezer Champagne, NP  escitalopram (LEXAPRO) 10 MG tablet Place 10 mg into feeding tube daily. 04/27/22  Yes [provider]  fentaNYL (DURAGESIC) 25 MCG/HR Place 1 patch onto the skin every 3 (three) days. 06/09/21  Yes Russella Dar, NP  ferrous sulfate 325 (65 FE) MG tablet Take 325 mg by mouth See admin instructions. Take one tablet by mouth every 2 days per g-tube   Yes [provider]  fiber (NUTRISOURCE FIBER) PACK packet Place 1 packet into feeding tube 2 (two) times daily. 06/09/21  Yes Russella Dar, NP  furosemide (LASIX) 10 MG/ML solution 40 mg See admin instructions. 40mg  intravenously every one day 06/14/22  Yes [provider]  guaiFENesin (ROBITUSSIN) 100 MG/5ML liquid Take 5 mLs by mouth every 6 (six) hours. 06/15/22  Yes [provider]  HYDROmorphone (DILAUDID) 2 MG tablet Place 0.5 tablets (1 mg total) into feeding tube every 8 (eight) hours as needed for severe pain. 06/30/21  Yes 08/30/21, NP  hydrOXYzine (ATARAX/VISTARIL) 25 MG  tablet Place 25 mg into feeding tube 3 (three) times daily.   Yes [provider]  ipratropium-albuterol (DUONEB) 0.5-2.5 (3) MG/3ML SOLN Take 3 mLs by nebulization every 4 (four) hours as needed (respiratory failure).   Yes [provider]  lactulose, encephalopathy, (CHRONULAC) 10 GM/15ML SOLN Place 30 mLs (20 g total) into feeding tube 2 (two) times daily as needed for mild constipation. 06/30/21  Yes 08/30/21, NP  lansoprazole (PREVACID) 30 MG capsule Place 30 mg into feeding tube daily at 12 noon.   Yes [provider]  levocetirizine (XYZAL) 5 MG tablet Place 5 mg into feeding tube every evening.   Yes [provider]  LORazepam (ATIVAN) 0.5 MG tablet Place 1 tablet (0.5 mg total) into feeding tube every 8 (eight) hours as needed for anxiety. Patient taking differently: Place 0.5 mg into feeding tube every 12 (twelve) hours as needed for anxiety. 06/09/21  Yes 06/11/21, NP  Melatonin 10 MG TABS Give 10 mg by tube at bedtime.   Yes [provider]  Menthol-Zinc Oxide (CALMOSEPTINE) 0.44-20.6 % OINT Apply 1 application topically See admin instructions. Apply thin layer of calmoseptine to sacral area, between brief changes for redness   Yes [provider]  meropenem (MERREM) 1 g injection Inject 1 g into the vein every 8 (eight) hours. 06/15/22  Yes [provider]  midodrine (PROAMATINE) 10 MG tablet Place 1 tablet (10 mg total) into feeding tube 3 (three) times daily. 06/30/21  Yes 08/30/21, NP  Nutritional Supplements (FEEDING SUPPLEMENT, OSMOLITE 1.5 CAL,) LIQD Place 35 mLs into feeding tube continuous.   Yes [provider]  ondansetron (ZOFRAN-ODT) 4 MG disintegrating tablet Take 4 mg by mouth every 8 (eight) hours as needed for nausea or vomiting.   Yes [provider]  polyethylene glycol (MIRALAX / GLYCOLAX) 17 g packet Place 17 g into feeding tube 2 (two) times daily. 06/09/21  Yes 06/11/21, NP  pregabalin (LYRICA) 100 MG capsule Place 1 capsule (100 mg total) into feeding tube 3 (three) times daily. 06/09/21  Yes 06/11/21, NP  sennosides (SENOKOT) 8.8 MG/5ML syrup Place 10 mLs into feeding tube 2 (two) times daily. 06/09/21  Yes 06/11/21, NP  acetaminophen (TYLENOL) 160 MG/5ML solution Place 30 mLs (  960 mg total) into feeding tube every 8 (eight) hours. 05/05/21   Mercy Riding, MD  Mouthwashes (MOUTH RINSE) LIQD solution 15 mLs by Mouth Rinse route in the morning and at bedtime. Patient not taking: No sig reported 06/30/21   Estill Cotta, NP  mupirocin ointment (BACTROBAN) 2 % Place 1 application into the nose 2 (two) times daily. Patient not taking: No sig reported 06/30/21   Estill Cotta, NP  Neomy-Bacit-Polymyx-Pramoxine (TRIPLE ANTIBIOTIC PLUS) 1 % OINT Apply 1 application topically See admin instructions. Apply thin layer to right great toe until healed leave open to air.    [provider]  Nutritional Supplements (FEEDING SUPPLEMENT, JEVITY 1.5 CAL/FIBER,) LIQD Place 1,000 mLs into feeding tube continuous. Patient not taking: No sig reported 06/09/21   Samella Parr, NP  Nutritional Supplements (FEEDING SUPPLEMENT, PROSOURCE TF,) liquid Place 45 mLs into feeding tube 3 (three) times daily. Patient not taking: Reported on 07/05/2021 06/09/21   Samella Parr, NP  sertraline (ZOLOFT) 50 MG tablet Place 50 mg into feeding tube at bedtime.  04/28/21  [provider]     Critical care time: NA     Baltazar Apo, MD, PhD 06/15/2022, 10:38 PM [facility] [phone] or if no answer before 7:00PM call [phone] For any issues after 7:00PM please call eLink [phone]

## 2022-06-15 NOTE — ED Triage Notes (Signed)
Pt arrives from [facility]. Pt has had AMS over past two days, thick green mucous, mottled extremeties. Temp 97.6 per report. Today pt staring off and not responsive- baseline he is interactive, able to tell staff when he needs to be cathed for urine. Pt is quadriplegic. Hx of pressure ulcers. Blood cultures and labs drawn at [facility] prior to coming.

## 2022-06-15 NOTE — ED Provider Notes (Signed)
[facility] EMERGENCY DEPARTMENT Provider Note   CSN: [id number] Arrival date & time: 06/15/22  1240    History  Chief Complaint  Patient presents with   Altered Mental Status    Andrew Rivera is a 71 y.o. male PMH trach/vent dependent, G-tube dependent, quadriplegia, ileus, pressure ulcers who is brought in by EMS for altered mental status.  EMS contributes the history.  They report that over the last 2 days, patient has had thickened trach secretions, which are currently a thick green.  EMS also reports mottled extremities.  Today, patient began staring off into space and is not responsive.  Per EMS, baseline reported by wife is that the patient is interactive, able to tell the staff when he needs to be cathed for urine.  Wife at bedside, contributes the history.  She reports that she last spoke with her husband Monday evening.  She says that he cannot talk due to history, but that he is able to interact with her and call her from his tablet.  On Monday, he was complaining of feeling unwell with some nausea.  She asked if he was experiencing any other symptoms at that time, and the patient denied.  She reports that their daughter went to visit him on Tuesday morning, however was only able to stay for a few minutes, and said that he was not acting like himself at that time.  Home Medications Prior to Admission medications   Medication Sig Start Date End Date Taking? Authorizing Provider  acetaminophen (TYLENOL) 160 MG/5ML solution Place 30 mLs (960 mg total) into feeding tube every 8 (eight) hours. 05/05/21   Almon Hercules, MD  bethanechol (URECHOLINE) 10 MG tablet Take 10 mg by mouth 3 (three) times daily.    [provider]  Carboxymethylcellulose Sodium 1 % GEL Place 1 drop into both eyes in the morning, at noon, and at bedtime.    [provider]  docusate sodium (COLACE) 100 MG capsule Take 1 capsule (100 mg total) by mouth 2 (two) times daily as  needed for mild constipation. 06/30/21   Eliezer Champagne, NP  fentaNYL (DURAGESIC) 25 MCG/HR Place 1 patch onto the skin every 3 (three) days. 06/09/21   Russella Dar, NP  ferrous sulfate 325 (65 FE) MG tablet Take 325 mg by mouth See admin instructions. Take one tablet by mouth every 2 days per g-tube    [provider]  fiber (NUTRISOURCE FIBER) PACK packet Place 1 packet into feeding tube 2 (two) times daily. 06/09/21   Russella Dar, NP  HYDROmorphone (DILAUDID) 2 MG tablet Place 0.5 tablets (1 mg total) into feeding tube every 8 (eight) hours as needed for severe pain. 06/30/21   Eliezer Champagne, NP  hydrOXYzine (ATARAX/VISTARIL) 25 MG tablet Place 25 mg into feeding tube every 6 (six) hours as needed for anxiety.    [provider]  ipratropium-albuterol (DUONEB) 0.5-2.5 (3) MG/3ML SOLN Take 3 mLs by nebulization every 4 (four) hours as needed (respiratory failure).    [provider]  lactulose, encephalopathy, (CHRONULAC) 10 GM/15ML SOLN Place 30 mLs (20 g total) into feeding tube 2 (two) times daily as needed for mild constipation. 06/30/21   Eliezer Champagne, NP  lansoprazole (PREVACID) 30 MG capsule Place 30 mg into feeding tube daily at 12 noon.    [provider]  levocetirizine (XYZAL) 5 MG tablet Place 5 mg into feeding tube every evening.    [provider]  LORazepam (ATIVAN) 0.5 MG tablet Place 1 tablet (0.5 mg total) into feeding tube every 8 (eight) hours as needed for anxiety. 06/09/21   Russella Dar, NP  Melatonin 10 MG TABS Give 10 mg by tube at bedtime.    [provider]  Menthol-Zinc Oxide (CALMOSEPTINE) 0.44-20.6 % OINT Apply 1 application topically See admin instructions. Apply thin layer of calmoseptine to sacral area, between brief changes for redness    [provider]  midodrine (PROAMATINE) 10 MG tablet Place 1 tablet (10 mg total) into feeding tube 3 (three) times daily. 06/30/21   Eliezer Champagne, NP   Mouthwashes (MOUTH RINSE) LIQD solution 15 mLs by Mouth Rinse route in the morning and at bedtime. Patient not taking: No sig reported 06/30/21   Eliezer Champagne, NP  mupirocin ointment (BACTROBAN) 2 % Place 1 application into the nose 2 (two) times daily. Patient not taking: No sig reported 06/30/21   Eliezer Champagne, NP  Neomy-Bacit-Polymyx-Pramoxine (TRIPLE ANTIBIOTIC PLUS) 1 % OINT Apply 1 application topically See admin instructions. Apply thin layer to right great toe until healed leave open to air.    [provider]  Nutritional Supplements (FEEDING SUPPLEMENT, JEVITY 1.5 CAL/FIBER,) LIQD Place 1,000 mLs into feeding tube continuous. Patient not taking: No sig reported 06/09/21   Russella Dar, NP  Nutritional Supplements (FEEDING SUPPLEMENT, PROSOURCE TF,) liquid Place 45 mLs into feeding tube 3 (three) times daily. Patient not taking: Reported on 07/05/2021 06/09/21   Russella Dar, NP  polyethylene glycol (MIRALAX / GLYCOLAX) 17 g packet Place 17 g into feeding tube 2 (two) times daily. 06/09/21   Russella Dar, NP  pregabalin (LYRICA) 100 MG capsule Place 1 capsule (100 mg total) into feeding tube 3 (three) times daily. 06/09/21   Russella Dar, NP  sennosides (SENOKOT) 8.8 MG/5ML syrup Place 10 mLs into feeding tube 2 (two) times daily. 06/09/21   Russella Dar, NP  sertraline (ZOLOFT) 50 MG tablet Place 50 mg into feeding tube at bedtime.  04/28/21  [provider]      Allergies    Chlorhexidine, Codeine, and Oxycodone-acetaminophen    Review of Systems   Review of Systems Unable to obtain given patient altered mental status.  Physical Exam Updated Vital Signs BP (!) 142/69 (BP Location: Right Arm)   Pulse (!) 126   Temp 99.1 F (37.3 C) (Rectal)   Resp 18   Ht 5\' 8"  (1.727 m)   SpO2 96%   BMI 28.49 kg/m  Physical Exam Vitals and nursing note reviewed.  Constitutional:      General: He is not in acute distress.    Appearance: He is  well-developed.     Comments: Patient is chronically ill-appearing with tracheostomy and ventilator present.  HENT:     Head: Normocephalic and atraumatic.     Right Ear: External ear normal.     Left Ear: External ear normal.     Nose: Nose normal.  Eyes:     Pupils: Pupils are equal, round, and reactive to light.  Cardiovascular:     Rate and Rhythm: Regular rhythm. Tachycardia present.     Heart sounds: No murmur heard. Pulmonary:     Effort: Pulmonary effort is normal. No respiratory distress.     Breath sounds: Normal breath sounds. No wheezing.     Comments: Coarse breath sounds heard bilaterally. Abdominal:     Palpations: Abdomen is soft.     Tenderness: There is  no abdominal tenderness. There is no guarding.  Musculoskeletal:     Cervical back: Neck supple.     Right lower leg: No edema.     Left lower leg: No edema.     Comments: Small, stage I pressure ulcer to right side of sacrum.  Skin:    General: Skin is warm and dry.  Neurological:     Mental Status: He is unresponsive.     GCS: GCS eye subscore is 4. GCS verbal subscore is 1. GCS motor subscore is 1.     Comments: Corneal reflexes present.  No oculocephalic reflex.  No gag reflex.  Does not wince or withdraw to painful stimuli. Upon reassessment, patient tracks, interacts with provider and mouths words appropriately.  Patient is alert and oriented x3.  Patient follows commands.     ED Results / Procedures / Treatments   Labs (all labs ordered are listed, but only abnormal results are displayed) Labs Reviewed  URINALYSIS, ROUTINE W REFLEX MICROSCOPIC - Abnormal; Notable for the following components:      Result Value   APPearance TURBID (*)    Hgb urine dipstick SMALL (*)    Protein, ur 100 (*)    Leukocytes,Ua LARGE (*)    RBC / HPF >50 (*)    WBC, UA >50 (*)    Bacteria, UA MANY (*)    All other components within normal limits  CBC WITH DIFFERENTIAL/PLATELET - Abnormal; Notable for the following  components:   WBC 14.8 (*)    RBC 3.88 (*)    Hemoglobin 12.5 (*)    HCT 38.1 (*)    Neutro Abs 13.1 (*)    Lymphs Abs 0.6 (*)    Abs Immature Granulocytes 0.10 (*)    All other components within normal limits  COMPREHENSIVE METABOLIC PANEL - Abnormal; Notable for the following components:   Chloride 97 (*)    Glucose, Bld 176 (*)    Creatinine, Ser <0.30 (*)    Albumin 2.8 (*)    AST 51 (*)    ALT 46 (*)    All other components within normal limits  I-STAT ARTERIAL BLOOD GAS, ED - Abnormal; Notable for the following components:   pCO2 arterial 57.1 (*)    pO2, Arterial 161 (*)    Bicarbonate 31.9 (*)    TCO2 34 (*)    Acid-Base Excess 5.0 (*)    Sodium 134 (*)    HCT 35.0 (*)    Hemoglobin 11.9 (*)    All other components within normal limits  URINE CULTURE  CULTURE, BLOOD (ROUTINE X 2)  CULTURE, BLOOD (ROUTINE X 2)  MRSA NEXT GEN BY PCR, NASAL  LACTIC ACID, PLASMA  BLOOD GAS, ARTERIAL    EKG EKG Interpretation  Date/Time:  Thursday June 15 2022 12:50:26 EDT Ventricular Rate:  125 PR Interval:  182 QRS Duration: 88 QT Interval:  299 QTC Calculation: 432 R Axis:   46 Text Interpretation: Sinus tachycardia Low voltage, extremity leads Probable anterolateral infarct, recent Minimal ST elevation, inferior leads SINCE LAST TRACING HEART RATE HAS INCREASED Confirmed by Margarita Grizzle [phone]) on 06/15/2022 2:28:56 PM  Radiology DG Chest Portable 1 View  Result Date: 06/15/2022 CLINICAL DATA:  Tracheal secretions. EXAM: PORTABLE CHEST 1 VIEW COMPARISON:  April 29, 2021. FINDINGS: Stable cardiomediastinal silhouette. Tracheostomy tube is in grossly good position. Bibasilar atelectasis or infiltrates are noted with small bilateral pleural effusions. Bony thorax is unremarkable. IMPRESSION: Bibasilar atelectasis or infiltrates are noted with small bilateral  pleural effusions. Electronically Signed   By: Lupita Raider M.D.   On: 06/15/2022 14:02    Procedures Procedures    Medications Ordered in ED Medications - No data to display  ED Course/ Medical Decision Making/ A&P Clinical Course as of 06/15/22 1629  Thu Jun 15, 2022  1616 Trach and vent dependent, g tube, quad, GCS 6 upon presentation 4-1-1, no gag reflex, now back to baseline. Initially tachycardic --> septic workup, ? PNA on XR, got vanc and cefepime,  [JG]    Clinical Course User Index [JG] Duard Brady, MD                           Medical Decision Making Amount and/or Complexity of Data Reviewed Labs: ordered. Radiology: ordered.  Risk Prescription drug management.   SHIMON TROWBRIDGE is a 71 y.o. male with  significant PMHx quadriplegia, trach/ventilator dependent who presented to the ED with altered mental status.  Vitals at presentation notable for tachycardia, with heart rate in the 120s.  Patient is hemodynamically stable, afebrile, satting well on ventilator.  Physical exam initially with concerning neurologic exam.  Patient unresponsive, with corneal reflexes, but no gag reflex or oculocephalic reflex.  No wincing or withdrawing to painful stimuli.  Normal heart sounds.  Lungs with coarse breath sounds bilaterally.  Abdomen is soft, nontender to palpation.  No pitting edema.  Initial differential includes but is not limited to: Infection/sepsis, including due to cellulitis, UTI, pneumonia, bacteremia; ICH; CVA; seizure-like activity; seizure; metabolic encephalopathy; delirium  Lab work obtained, resulted notable for CBC with leukocytosis 14.8.  Hemoglobin 12.5, improved from prior.  CMP without significant electrolyte derangement.  No evidence of AKI.  Mildly elevated LFTs, with AST 51, ALT 46.  Normal total bili.  No anion gap.  Lactic 1.2.  ABG with normal pH 7.356, mildly elevated PCO2 57.1.  Bicarb elevated at 31.9.  UA with leukocytes, RBCs, WBCs, many bacteria, but no nitrites.  Urine culture obtained, pending.  Blood cultures obtained, pending.  Imaging was pursued, notable  for bibasilar atelectasis versus infiltrates with bilateral pleural effusions per radiology read.  EKG obtained, demonstrates sinus rhythm, ventricular rate 125 bpm.  No evidence of abnormal intervals or acute ischemia.  Interpreted by myself and my attending.  Interventions include broad-spectrum antibiotics with IV vancomycin, IV cefepime, IV LR bolus  Upon reassessment, patient is now able to interact with this provider.  He tracks me around the room.  Patient also mouths answers to questions.  Patient is alert and oriented to self, time, place, and is able to follow commands.  Patient denies any complaints including chest pain, difficulty breathing, nausea, vomiting, abdominal pain.  Unclear exact etiology behind today's presentation.  However, patient will benefit from additional evaluation in the hospital setting.  At the time of signout, patient is still awaiting CT head to evaluate for potential ICH.    The plan for this patient was discussed with Dr. Rosalia Hammers, who voiced agreement and who oversaw evaluation and treatment of this patient.    Final Clinical Impression(s) / ED Diagnoses Final diagnoses:  Altered mental status, unspecified altered mental status type    Rx / DC Orders ED Discharge Orders     None         Skeet Simmer, MD 06/15/22 1630    Margarita Grizzle, MD 06/16/22 1718

## 2022-06-15 NOTE — Progress Notes (Signed)
Pharmacy Antibiotic Note  Andrew Rivera is a 71 y.o. male for which pharmacy has been consulted for vancomycin dosing for pneumonia.  Patient with a history of trach/vent dependent, G-tube dependent, quadriplegia, ileus, pressure ulcers from [facility]. Patient presenting with AMS.  Per [facility] paperwork meropenem 7 day course to begin today.  SCr <0.3 WBC 14.8; LA 1.2; T 99.1; HR 97; RR 21  Plan: Cefepime x 1 dose given in ED - awaiting continuation orders Vancomycin 1750 mg once - given SCr nd and pt age will defer to random level to help determine frequency and dosing 24hr VR ordered Trend WBC, Fever, Renal function, & Clinical course F/u cultures, clinical course, WBC, fever De-escalate when able  Height: 5\' 8"  (172.7 cm) IBW/kg (Calculated) : 68.4  Temp (24hrs), Avg:99.1 F (37.3 C), Min:99.1 F (37.3 C), Max:99.1 F (37.3 C)  No results for input(s): "WBC", "CREATININE", "LATICACIDVEN", "VANCOTROUGH", "VANCOPEAK", "VANCORANDOM", "GENTTROUGH", "GENTPEAK", "GENTRANDOM", "TOBRATROUGH", "TOBRAPEAK", "TOBRARND", "AMIKACINPEAK", "AMIKACINTROU", "AMIKACIN" in the last 168 hours.  CrCl cannot be calculated (Patient's most recent lab result is older than the maximum 21 days allowed.).    Allergies  Allergen Reactions   Chlorhexidine Itching and Rash   Codeine Nausea Only   Oxycodone-Acetaminophen Nausea Only    Other reaction(s): Hallucinations    Antimicrobials this admission: vancomycin 8/24 >>  cefepime 8/24 >>  Microbiology results: Pending  Thank you for allowing pharmacy to be a part of this patient's care.  9/24, PharmD, BCPS 06/15/2022 1:47 PM ED Clinical Pharmacist -  [phone]

## 2022-06-15 NOTE — H&P (Addendum)
Date: 06/15/2022               Patient Name:  Andrew Rivera MRN: [id number]  DOB: 1951/08/28 Age / Sex: 71 y.o., male   PCP: Michail Sermon, MD         Medical Service: Internal Medicine Teaching Service         Attending Physician: Dr. Gust Rung, DO    First Contact: Rocky Morel, DO  Pager JG [phone]   Second Contact: Thalia Bloodgood, DO  Pager: Theresa Duty [phone]        After Hours (After 5p/  First Contact Pager: [phone]  weekends / holidays): Second Contact Pager: [phone]   SUBJECTIVE   Chief Complaint: Staring spell  History of Present Illness:   Andrew Rivera is a 72 y/o person living with a history of tracheostomy/ventilator, gastric tube dependency of unclear etiology, chronic back pain s/p surgical repair with post operative complications of infected hardware who lives at [facility] presents after staring spell.   He was in his usual state of health until this morning around 7 AM when he felt flushed, diaphoretic, and not himself. He does not remember what occurred next. His wife walked into his room around 11-am and found him staring up at the ceiling and was not responding or following commands. She notified nursing staff who called EMS. Per EMS, patient began responding more once in route and fluids hung. Per patient's wife he started to become more alert and responsive around 1-2pm when antibiotics and other medications were hung. Denies history of similar episodes.  He notes that he slept very well the night prior.  Respiratory therapy at [facility] did note patient having increasing and green secretions. He denies recent fevers, chills, cough, chest pain, shortness of breath, abdominal pain, n/v/d, or changes in urinary habits.   Meds:   Bethanechol chloride 10 mg TID  Dropperette gel - both eyes Ferrous sulfate 325 every two days Pregabalin 100 mg TID Enulose 30 mL Fiber packet Calmoseptine topical ointment 100 mg stool softner Levocetririzine 5 mg  daily Midodrine 10 mg TID Dialudid 2 mg PRN q8h pain Melatonin 10 mg-theanine 5.5 mg tab Duonebz prn Tylenol 975 mg TID Hydroxyzine 25 mg TID Polyethylene glycol 17 g BID Senna Lorazepam .5 mg PRN q12h Fentanyl 25 mcg transdermal every 3 days Glycerin PRN  Lansoprazole 30 mg daily Escitalopram 10 mg daily Scopalmine 1 mg every 3 days Osmolite 35 ml/hr daily Zofran 4 mg daily, one time dose at [facility] Lasix 40 mg iv daily, one time dose at [facility] Meropenem, started at [facility] on 06/15/2022 Guaifenesin 600 mg   Past Medical History  Past Surgical History:  Procedure Laterality Date   IR [facility] GASTRO/COLONIC TUBE PERCUT W/FLUORO  07/05/2021   Social:  Lives at [facility]  Support: Family in area Level of Function: Dependent on ADL/IADL PCP: [facility]  Substances: Hx of tobacco use, quit 40 years ago. Daily alcohol use, but stopped years ago. Denies other substance use.   Family History:  Mother with history of colon cancer. Patient up to date on colon cancer screening  Allergies: Allergies as of 06/15/2022 - Review Complete 06/15/2022  Allergen Reaction Noted   Chlorhexidine Itching and Rash 04/25/2021   Codeine Nausea Only 02/11/2013   Oxycodone-acetaminophen Nausea Only 11/21/2016    Review of Systems: A complete ROS was negative except as per HPI.   OBJECTIVE:   Physical Exam: Blood pressure 125/65, pulse 97, temperature 99.6 F (37.6 C),  temperature source Oral, resp. rate 18, height 5\' 8"  (1.727 m), weight 77.6 kg, SpO2 99 %.  Constitutional: no acute distress. HENT: normocephalic atraumatic, mucous membranes moist. No tongue lesions. Tracheostomy in place. Eyes: conjunctiva non-erythematous Neck: supple Cardiovascular: regular rate and rhythm, no m/r/g Pulmonary/Chest: normal work of breathing. No respiratory distress  Abdominal: soft, non-tender, non-distended MSK: normal bulk and tone Neurological: alert & oriented x 3, limited exam secondary to  chronic weakness. PERRL.  Skin: warm and dry, multiple small strawberry angiomas on chest and abdomen Psych: normal mood  Labs: CBC    Component Value Date/Time   WBC 14.8 (H) 06/15/2022 1341   RBC 3.88 (L) 06/15/2022 1341   HGB 11.9 (L) 06/15/2022 1514   HCT 35.0 (L) 06/15/2022 1514   PLT 298 06/15/2022 1341   MCV 98.2 06/15/2022 1341   MCH 32.2 06/15/2022 1341   MCHC 32.8 06/15/2022 1341   RDW 13.7 06/15/2022 1341   LYMPHSABS 0.6 (L) 06/15/2022 1341   MONOABS 1.0 06/15/2022 1341   EOSABS 0.0 06/15/2022 1341   BASOSABS 0.0 06/15/2022 1341     CMP     Component Value Date/Time   NA 134 (L) 06/15/2022 1514   K 3.8 06/15/2022 1514   CL 97 (L) 06/15/2022 1341   CO2 28 06/15/2022 1341   GLUCOSE 176 (H) 06/15/2022 1341   BUN 19 06/15/2022 1341   CREATININE <0.30 (L) 06/15/2022 1341   CALCIUM 9.0 06/15/2022 1341   PROT 7.1 06/15/2022 1341   ALBUMIN 2.8 (L) 06/15/2022 1341   AST 51 (H) 06/15/2022 1341   ALT 46 (H) 06/15/2022 1341   ALKPHOS 124 06/15/2022 1341   BILITOT 0.4 06/15/2022 1341   GFRNONAA NOT CALCULATED 06/15/2022 1341   GFRAA >60 01/27/2020 0229    Imaging:  CXR showed Bibasilar atelectasis or infiltrates are noted with small bilateral pleural effusions.  CT Head WO showed no acute intracranial abnormalities.   EKG: personally reviewed my interpretation is sinus tachycardia with low voltage in the extremity leads, normal intervals.   ASSESSMENT & PLAN:   Assessment & Plan by Problem: Principal Problem:   Staring episodes   Andrew Rivera is a 71 y.o. person living with a history of racheostomy/ventilator, gastric tube dependency of unclear etiology, chronic back pain s/p surgical repair with post operative complications of infected hardware who lives at [facility] presents after staring spell and admitted for infectious vs seizure workup on hospital day 0  Staring Spell Seizure vs UTI vs PNA Leukocytosis Pyuria, Bacteruria Patient was found this  morning to be minimally responsive and staring off into space after having subjective feelings of flushing, diaphoresis, and drowsiness a couple hours prior.  No prior episodes.  Unclear of presence of tonic, clonic, or myoclonus as he is quadriplegic.  There does seem to be evidence of a postictal period but again this is not entirely clear.  CT of the head showed no acute abnormalities.  He was afebrile on presentation but did have leukocytosis at 14.8 and urinalysis showed leukocytes, bacteria, red blood cells, and white blood cells.  Chest x-ray showed bibasilar atelectasis versus infiltrates.  MRSA nasal swab positive.  Blood/urine cultures pending.  He did show improvement after getting fluids with EMS and then fluids and antibiotics in the emergency department.  When evaluated by emergency department he was still minimally responsive to questioning however whenever we spoke to him and his wife he was at his baseline.  Neurology was consulted and we appreciate their recommendations. -  MRI of the head - EEG - Vanc and cefepime - Follow blood/urine cultures  Tracheostomy dependent Patient with a history of chronic back pain who underwent spinal surgery x2 with initial benefit that this was followed by progressively worsening weakness in his lower extremities and frequent bloodstream infections.  It was determined that the hardware in his spine was seeded by these infections or the cause.  He continues to have progressively worsening weakness that involved his respiratory function and was requiring intubation frequently.  November 2020 a trach was placed to avoid reintubation and was planned to be temporary.  He has been trach dependent for 3 years.  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 - PCCM is on board to manage trach  Pressure Ulcers Patient has known ulcers due to bedbound status.  We will involve wound care and monitor these during his admission.  Diet: Tube Feeds VTE:  DOAC IVF: None,None Code: Full  Prior to Admission Living Arrangement:  [facility] Anticipated Discharge Location:  [facility] Barriers to Discharge: Seizure/infective workup  Dispo: Admit patient to Inpatient with expected length of stay greater than 2 midnights.  Signed: Rocky Morel, DO Internal Medicine Resident PGY-3  06/15/2022, 8:58 PM

## 2022-06-16 ENCOUNTER — Inpatient Hospital Stay (HOSPITAL_COMMUNITY): Payer: Medicare HMO

## 2022-06-16 DIAGNOSIS — J9610 Chronic respiratory failure, unspecified whether with hypoxia or hypercapnia: Secondary | ICD-10-CM | POA: Diagnosis not present

## 2022-06-16 DIAGNOSIS — Z9911 Dependence on respirator [ventilator] status: Secondary | ICD-10-CM

## 2022-06-16 DIAGNOSIS — L89151 Pressure ulcer of sacral region, stage 1: Secondary | ICD-10-CM

## 2022-06-16 DIAGNOSIS — R4182 Altered mental status, unspecified: Secondary | ICD-10-CM

## 2022-06-16 DIAGNOSIS — R8281 Pyuria: Secondary | ICD-10-CM | POA: Diagnosis not present

## 2022-06-16 DIAGNOSIS — D72829 Elevated white blood cell count, unspecified: Secondary | ICD-10-CM

## 2022-06-16 DIAGNOSIS — R8271 Bacteriuria: Secondary | ICD-10-CM

## 2022-06-16 DIAGNOSIS — R404 Transient alteration of awareness: Secondary | ICD-10-CM | POA: Diagnosis not present

## 2022-06-16 DIAGNOSIS — Z930 Tracheostomy status: Secondary | ICD-10-CM

## 2022-06-16 DIAGNOSIS — L98429 Non-pressure chronic ulcer of back with unspecified severity: Secondary | ICD-10-CM | POA: Diagnosis present

## 2022-06-16 LAB — CBC WITH DIFFERENTIAL/PLATELET
Abs Immature Granulocytes: 0.06 10*3/uL (ref 0.00–0.07)
Basophils Absolute: 0 10*3/uL (ref 0.0–0.1)
Basophils Relative: 0 %
Eosinophils Absolute: 0 10*3/uL (ref 0.0–0.5)
Eosinophils Relative: 0 %
HCT: 35.8 % — ABNORMAL LOW (ref 39.0–52.0)
Hemoglobin: 12.1 g/dL — ABNORMAL LOW (ref 13.0–17.0)
Immature Granulocytes: 1 %
Lymphocytes Relative: 13 %
Lymphs Abs: 1.4 10*3/uL (ref 0.7–4.0)
MCH: 31.9 pg (ref 26.0–34.0)
MCHC: 33.8 g/dL (ref 30.0–36.0)
MCV: 94.5 fL (ref 80.0–100.0)
Monocytes Absolute: 0.9 10*3/uL (ref 0.1–1.0)
Monocytes Relative: 8 %
Neutro Abs: 9.1 10*3/uL — ABNORMAL HIGH (ref 1.7–7.7)
Neutrophils Relative %: 78 %
Platelets: 274 10*3/uL (ref 150–400)
RBC: 3.79 MIL/uL — ABNORMAL LOW (ref 4.22–5.81)
RDW: 13.5 % (ref 11.5–15.5)
WBC: 11.5 10*3/uL — ABNORMAL HIGH (ref 4.0–10.5)
nRBC: 0 % (ref 0.0–0.2)

## 2022-06-16 LAB — BASIC METABOLIC PANEL
Anion gap: 9 (ref 5–15)
BUN: 14 mg/dL (ref 8–23)
CO2: 27 mmol/L (ref 22–32)
Calcium: 9 mg/dL (ref 8.9–10.3)
Chloride: 101 mmol/L (ref 98–111)
Creatinine, Ser: <0.3 mg/dL — ABNORMAL LOW (ref 0.61–1.24)
Glucose, Bld: 97 mg/dL (ref 70–99)
Potassium: 3.6 mmol/L (ref 3.5–5.1)
Sodium: 137 mmol/L (ref 135–145)

## 2022-06-16 LAB — MAGNESIUM: Magnesium: 2.2 mg/dL (ref 1.7–2.4)

## 2022-06-16 LAB — GLUCOSE, CAPILLARY
Glucose-Capillary: 91 mg/dL (ref 70–99)
Glucose-Capillary: 116 mg/dL — ABNORMAL HIGH (ref 70–99)
Glucose-Capillary: 133 mg/dL — ABNORMAL HIGH (ref 70–99)
Glucose-Capillary: 131 mg/dL — ABNORMAL HIGH (ref 70–99)
Glucose-Capillary: 158 mg/dL — ABNORMAL HIGH (ref 70–99)
Glucose-Capillary: 152 mg/dL — ABNORMAL HIGH (ref 70–99)
Glucose-Capillary: 156 mg/dL — ABNORMAL HIGH (ref 70–99)

## 2022-06-16 LAB — VANCOMYCIN, RANDOM: Vancomycin Rm: 7 ug/mL

## 2022-06-16 LAB — HIV ANTIBODY (ROUTINE TESTING W REFLEX): HIV Screen 4th Generation wRfx: NONREACTIVE

## 2022-06-16 LAB — PHOSPHORUS: Phosphorus: 2.4 mg/dL — ABNORMAL LOW (ref 2.5–4.6)

## 2022-06-16 MED ORDER — VITAL HIGH PROTEIN PO LIQD: 1000.0000 mL | ORAL | Status: DC

## 2022-06-16 MED ORDER — OSMOLITE 1.5 CAL PO LIQD
1000.0000 mL | ORAL | Status: DC
  Administered 2022-06-16 – 2022-06-20 (×5): 1000 mL

## 2022-06-16 MED ORDER — SODIUM CHLORIDE 0.9 % IV SOLN
2.0000 g | Freq: Three times a day (TID) | INTRAVENOUS | Status: DC
  Administered 2022-06-16 – 2022-06-17 (×4): 2 g via INTRAVENOUS
  Filled 2022-06-16 (×5): qty 12.5

## 2022-06-16 MED ORDER — PANTOPRAZOLE 2 MG/ML SUSPENSION
40.0000 mg | Freq: Every day | ORAL | Status: DC
  Administered 2022-06-17 – 2022-06-21 (×5): 40 mg
  Filled 2022-06-16 (×5): qty 20

## 2022-06-16 MED ORDER — PROSOURCE TF20 ENFIT COMPATIBL EN LIQD
60.0000 mL | Freq: Every day | ENTERAL | Status: DC
  Administered 2022-06-16 – 2022-06-21 (×6): 60 mL
  Filled 2022-06-16 (×6): qty 60

## 2022-06-16 MED ORDER — FREE WATER
150.0000 mL | Status: DC
  Administered 2022-06-16 – 2022-06-21 (×30): 150 mL

## 2022-06-16 MED ORDER — LIP MEDEX EX OINT
TOPICAL_OINTMENT | CUTANEOUS | Status: DC | PRN
  Administered 2022-06-16 – 2022-06-19 (×4): 75 via TOPICAL
  Administered 2022-06-20: 1 via TOPICAL
  Administered 2022-06-20: 75 via TOPICAL
  Filled 2022-06-16: qty 7

## 2022-06-16 MED ORDER — ORAL CARE MOUTH RINSE
15.0000 mL | OROMUCOSAL | Status: DC | PRN
Start: 2022-06-16 — End: 2022-06-21

## 2022-06-16 MED ORDER — VANCOMYCIN HCL IN DEXTROSE 1-5 GM/200ML-% IV SOLN
1000.0000 mg | Freq: Two times a day (BID) | INTRAVENOUS | Status: DC
Start: 2022-06-16 — End: 2022-06-18
  Administered 2022-06-16 – 2022-06-18 (×4): 1000 mg via INTRAVENOUS
  Filled 2022-06-16 (×4): qty 200

## 2022-06-16 MED ORDER — ORAL CARE MOUTH RINSE
15.0000 mL | OROMUCOSAL | Status: DC
  Administered 2022-06-17 – 2022-06-21 (×49): 15 mL via OROMUCOSAL

## 2022-06-16 NOTE — Plan of Care (Signed)

## 2022-06-16 NOTE — Progress Notes (Signed)
   NAME:  Andrew Rivera, MRN:  [id number], DOB:  04-12-1951, LOS: 1 ADMISSION DATE:  06/15/2022, CONSULTATION DATE: 06/15/2022 REFERRING MD: Dr. 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  Resides at [facility].  He was brought to the ED 8/24 with flushing, diaphoresis and altered mental status.  He apparently experienced a staring spell that was witnessed.  Initially poorly responsive following the spell and then slowly return to normal mental status.  Evaluation in the ED noted leukocytosis, pyuria.  He had some increased green secretions with question bronchitis versus pneumonia.  He has been placed on antibiotics and evaluation, cultures ongoing.  Neurology has recommended EEG, MRI brain.  PCCM consulted regarding his tracheostomy and ventilator dependence.  Pertinent  Medical History   Past Medical History:  Diagnosis Date   Acute on chronic respiratory failure with hypoxia (HCC)    Altered mental status, unspecified    Chronic pain syndrome    Critical illness myopathy    Internal jugular (IJ) vein thromboembolism, acute, unspecified laterality (HCC)    Septic shock (HCC)     Significant Hospital Events: Including procedures, antibiotic start and stop dates in addition to other pertinent events     Interim History / Subjective:  Feels well, denies pain.  Objective   Blood pressure (!) 125/48, pulse (!) 103, temperature 98.1 F (36.7 C), temperature source Oral, resp. rate 14, height 5\' 8"  (1.727 m), weight 77.6 kg, SpO2 96 %.    Vent Mode: PRVC FiO2 (%):  [40 %-50 %] 40 % Set Rate:  [18 bmp] 18 bmp Vt Set:  [540 mL] 540 mL PEEP:  [5 cmH20] 5 cmH20 Plateau Pressure:  [15 cmH20-20 cmH20] 20 cmH20   Intake/Output Summary (Last 24 hours) at 06/16/2022 1127 Last data  filed at 06/16/2022 0800 Gross per 24 hour  Intake 1668.65 ml  Output 300 ml  Net 1368.65 ml    Filed Weights   06/15/22 1727  Weight: 77.6 kg    Examination: Chronically ill appearing Trach CDI minimal secretions Lungs diminished bases, passive on vent Global mild anasarca PEG in place  Bibasilar infiltrates on CXR WBC improved  Resolved Hospital Problem list     Assessment & Plan:  Chronic ventilator dependence apparently due to neuromuscular weakness, respiratory muscle weakness.  Cause not entirely clear.  Is being evaluated for phrenic nerve palsies.  Question whether this is the sequela of severe critical illness polymyopathy.  States that he is on 0.40, PEEP 5 as his baseline settings.  Defer further vent wean efforts to his LTACH.  Question HCAP- tracheal aspirate ordered UTI- on cefepime/vanc, per primary Question seizures- per primary  - Continue vent support - Vent wean at discretion of vent-SNF - Abx and further infectious/neuro workup per primary - Will see again Monday if still here  Saturday MD PCCM

## 2022-06-16 NOTE — Progress Notes (Signed)
Initial Nutrition Assessment  DOCUMENTATION CODES:   Not applicable  INTERVENTION:   TF via g-tube:   Osmolite 1.5 @ 55 ml/hr (1320 ml daily)  60 ml Prosource TF 20 daily  150 ml free water flush every 4 hours  Tube feeding regimen provides 2060 kcal (100% of needs), 103 grams of protein, and 1006 ml of H2O.  Total free water: 1903 ml daily  NUTRITION DIAGNOSIS:   Inadequate oral intake related to inability to eat as evidenced by NPO status.  GOAL:   Patient will meet greater than or equal to 90% of their needs  MONITOR:   Vent status, TF tolerance  REASON FOR ASSESSMENT:   Ventilator, New TF    ASSESSMENT:   Pt with a history of racheostomy/ventilator, gastric tube dependency of unclear etiology, chronic back pain s/p surgical repair with post operative complications of infected hardware who lives at [facility] presents after staring spell and admitted for infectious vs seizure workup  Pt admitted with staring spell, seizure vs UTI, vs pneumonia.   Reviewed I/O's: +1.3 L x 24 hours  UOP: 300 ml x 24 hours   Patient on ventilator support via trach MV: 9.4 L/min Temp (24hrs), Avg:98.9 F (37.2 C), Min:98.1 F (36.7 C), Max:99.9 F (37.7 C)  Per SLP notes, pt does not use PSMV or eat or drink orally. Pt wife at bedside confirms pt receives nutrition solely via g-tube.   Pt and wife confirm that pt tolerating TF well PTA. They deny any nausea, vomiting, or abdominal pain/ distention. Home TF formula is Osmolite 1.5, but unsure of rate PTA. Currently running at 35 ml/hr, which provides 1260 kcals, 53 grams protein, and 640 ml water daily, which does not sufficiently meet needs. RD to adjust TF orders to better meet pt needs.   Pt denies any weight loss. He reports concern of weight gain since residing at [facility]. Reviewed wt hx; pt has experienced a 8.7% wt loss over the past year, which is not significant for time frame.   Noted pt with generalized fat and muscle  depletions and suspect some are related to bedbound status.   Medications reviewed and include colace, ferrous sulfate, miralax, and senokot.   Labs reviewed: CBGS: 91-133.   NUTRITION - FOCUSED PHYSICAL EXAM:  Flowsheet Row Most Recent Value  Orbital Region Moderate depletion  Upper Arm Region No depletion  Thoracic and Lumbar Region No depletion  Buccal Region Mild depletion  Temple Region Moderate depletion  Clavicle Bone Region Mild depletion  Clavicle and Acromion Bone Region Mild depletion  Scapular Bone Region Mild depletion  Dorsal Hand No depletion  Patellar Region Mild depletion  Anterior Thigh Region Mild depletion  Posterior Calf Region Mild depletion  Edema (RD Assessment) None  Hair Reviewed  Eyes Reviewed  Mouth Reviewed  Skin Reviewed  Nails Reviewed       Diet Order:   Diet Order     None       EDUCATION NEEDS:   Education needs have been addressed  Skin:  Skin Assessment: Skin Integrity Issues: Skin Integrity Issues:: Stage I Stage I: sacrum, buttocks  Last BM:  06/15/22  Height:   Ht Readings from Last 1 Encounters:  06/15/22 5\' 8"  (1.727 m)    Weight:   Wt Readings from Last 1 Encounters:  06/15/22 77.6 kg    Ideal Body Weight:  70 kg  BMI:  Body mass index is 26 kg/m.  Estimated Nutritional Needs:   Kcal:  1900-2100  Protein:  100-120 grams  Fluid:  > 1.9 L    Levada Schilling, RD, LDN, CDCES Registered Dietitian II Certified Diabetes Care and Education Specialist Please refer to [facility] for RD and/or RD on-call/weekend/after hours pager

## 2022-06-16 NOTE — Progress Notes (Signed)
Pt transported from ED to MRI then to 3M09 on 100% w/settings as per flowsheet w/out incidence. Pt tol well.

## 2022-06-16 NOTE — Procedures (Signed)
Patient Name: Andrew Rivera  MRN: [id number]  Epilepsy Attending: Charlsie Quest  Referring Physician/Provider: Lyndle Herrlich, MD  Date: 06/16/2022 Duration: 23.51 mins  Patient history: 71yo M with seizure like activity. EEG to evaluate for seizure.  Level of alertness: Awake  AEDs during EEG study: None  Technical aspects: This EEG study was done with scalp electrodes positioned according to the 10-20 International system of electrode placement. Electrical activity was reviewed with band pass filter of 1-70Hz , sensitivity of 7 uV/mm, display speed of 65mm/sec with a 60Hz  notched filter applied as appropriate. EEG data were recorded continuously and digitally stored.  Video monitoring was available and reviewed as appropriate.  Description: The posterior dominant rhythm consists of 8 Hz activity of moderate voltage (25-35 uV) seen predominantly in posterior head regions, symmetric and reactive to eye opening and eye closing. EEG showed intermittent generalized 3 to 6 Hz theta-delta slowing. Hyperventilation and photic stimulation were not performed.     ABNORMALITY - Intermittent slow, generalized  IMPRESSION: This study is suggestive of mild diffuse encephalopathy, nonspecific etiology. No seizures or epileptiform discharges were seen throughout the recording.  [name] 

## 2022-06-16 NOTE — Progress Notes (Signed)
HD#1 Subjective:   Summary: Andrew Rivera is a 71 y.o. person living with a history of tracheostomy/ventilator, gastric tube dependency of unclear etiology, chronic back pain s/p surgical repair with post operative complications of infected hardware who lives at [facility] presents after staring spell and admitted for infectious vs seizure workup.   Overnight Events: NAEO.   Patient endorses improvement today. Denies any recurrence of staring spells or episodes of flushing or diaphoresis.   Objective:  Vital signs in last 24 hours: Vitals:   06/16/22 1000 06/16/22 1100 06/16/22 1147 06/16/22 1200  BP: (!) 134/55 (!) 125/48  (!) 115/42  Pulse: (!) 104 (!) 103  94  Resp: 18 14  14   Temp:  98.1 F (36.7 C)    TempSrc:  Oral    SpO2: 97% 96% 98% 98%  Weight:      Height:       Supplemental O2:  trach SpO2: 98 % FiO2 (%): 40 %   Physical Exam:  Constitutional: middle aged male, global weakness, lying in hospital bed, in no acute distress, trach in place HENT: normocephalic atraumatic, mucous membranes moist Eyes: conjunctiva non-erythematous Neck: supple Cardiovascular: regular rate and rhythm, no m/r/g Pulmonary/Chest: ventilated breath sounds bilaterally Neurological: Attempts to speak but unable to vocalize. Unable to move arms or legs. Skin: warm and dry Psych: Normal mood and affect  Filed Weights   06/15/22 1727  Weight: 77.6 kg     Intake/Output Summary (Last 24 hours) at 06/16/2022 1230 Last data filed at 06/16/2022 1200 Gross per 24 hour  Intake 1908.65 ml  Output 300 ml  Net 1608.65 ml   Net IO Since Admission: 1,608.65 mL [06/16/22 1230]  Pertinent Labs:    Latest Ref Rng & Units 06/16/2022    1:34 AM 06/15/2022    3:14 PM 06/15/2022    1:41 PM  CBC  WBC 4.0 - 10.5 K/uL 11.5   14.8   Hemoglobin 13.0 - 17.0 g/dL 06/17/2022  55.7  32.2   Hematocrit 39.0 - 52.0 % 35.8  35.0  38.1   Platelets 150 - 400 K/uL 274   298        Latest Ref Rng & Units  06/16/2022    1:34 AM 06/15/2022    3:14 PM 06/15/2022    1:41 PM  CMP  Glucose 70 - 99 mg/dL 97   06/17/2022   BUN 8 - 23 mg/dL 14   19   Creatinine 427 - 1.24 mg/dL 0.62   <3.76   Sodium 135 - 145 mmol/L 137  134  135   Potassium 3.5 - 5.1 mmol/L 3.6  3.8  3.7   Chloride 98 - 111 mmol/L 101   97   CO2 22 - 32 mmol/L 27   28   Calcium 8.9 - 10.3 mg/dL 9.0   9.0   Total Protein 6.5 - 8.1 g/dL   7.1   Total Bilirubin 0.3 - 1.2 mg/dL   0.4   Alkaline Phos 38 - 126 U/L   124   AST 15 - 41 U/L   51   ALT 0 - 44 U/L   46     Imaging: MR BRAIN WO CONTRAST  Result Date: 06/16/2022 CLINICAL DATA:  Mental status change EXAM: MRI HEAD WITHOUT CONTRAST TECHNIQUE: Multiplanar, multiecho pulse sequences of the brain and surrounding structures were obtained without intravenous contrast. COMPARISON:  None Available. FINDINGS: Brain: No acute infarct, mass effect or extra-axial collection. No acute or chronic  hemorrhage. There is multifocal hyperintense T2-weighted signal within the Keiaira Donlan matter. Generalized volume loss. The midline structures are normal. Vascular: Major flow voids are preserved. Skull and upper cervical spine: Normal calvarium and skull base. Visualized upper cervical spine and soft tissues are normal. Sinuses/Orbits:Left mastoid effusion normal orbits. IMPRESSION: 1. No acute intracranial abnormality. 2. Mild findings of chronic small vessel ischemia and volume loss. 3. Left mastoid effusion. Electronically Signed   By: Deatra Robinson M.D.   On: 06/16/2022 01:30   CT Head Wo Contrast  Result Date: 06/15/2022 CLINICAL DATA:  Altered mental status. EXAM: CT HEAD WITHOUT CONTRAST TECHNIQUE: Contiguous axial images were obtained from the base of the skull through the vertex without intravenous contrast. RADIATION DOSE REDUCTION: This exam was performed according to the departmental dose-optimization program which includes automated exposure control, adjustment of the mA and/or kV according to  patient size and/or use of iterative reconstruction technique. COMPARISON:  None Available. FINDINGS: Brain: No acute infarct, hemorrhage, or mass lesion is present. No significant Ndidi Nesby matter lesions are present. The ventricles are of normal size. No significant extraaxial fluid collection is present. The brainstem and cerebellum are within normal limits. Vascular: Atherosclerotic calcifications are present within the cavernous internal carotid arteries bilaterally. No hyperdense vessel is present. Skull: Calvarium is intact. No focal lytic or blastic lesions are present. No significant extracranial soft tissue lesion is present. Sinuses/Orbits: A left mastoid effusion is present. No obstructing nasopharyngeal lesion is present. No middle ear fusion is present. The paranasal sinuses and mastoid air cells are otherwise clear. Bilateral lens replacements are noted. Globes and orbits are otherwise unremarkable. IMPRESSION: 1. Normal CT appearance of the brain. 2. Left mastoid effusion without obstructing nasopharyngeal lesion. Electronically Signed   By: Marin Roberts M.D.   On: 06/15/2022 16:25   DG Chest Portable 1 View  Result Date: 06/15/2022 CLINICAL DATA:  Tracheal secretions. EXAM: PORTABLE CHEST 1 VIEW COMPARISON:  April 29, 2021. FINDINGS: Stable cardiomediastinal silhouette. Tracheostomy tube is in grossly good position. Bibasilar atelectasis or infiltrates are noted with small bilateral pleural effusions. Bony thorax is unremarkable. IMPRESSION: Bibasilar atelectasis or infiltrates are noted with small bilateral pleural effusions. Electronically Signed   By: Lupita Raider M.D.   On: 06/15/2022 14:02    Assessment/Plan:   Principal Problem:   Staring episodes Active Problems:   Chronic respiratory failure requiring continuous mechanical ventilation through tracheostomy (HCC)   Ventilator dependent (HCC)   Tracheostomy dependence (HCC)   Stage 1 skin ulcer of sacral region  [facility])   Patient Summary: Andrew Rivera is a 71 y.o. person living with a history of racheostomy/ventilator, gastric tube dependency of unclear etiology, chronic back pain s/p surgical repair with post operative complications of infected hardware who lives at [facility] presents after staring spell and admitted for infectious vs seizure workup   Staring Spell UTI vs PNA Leukocytosis Pyuria, Bacteruria Initial urinalysis showed leukocytes, bacteria, red blood cells, and Kelin Borum blood cells.  Chest x-ray showed bibasilar atelectasis versus infiltrates. RVP negative. Reports of increased respiratory secretions and change in color to green prior to admission. MRSA nasal swab positive. Remains afebrile. WBC improved to 11.5 today. MRI w/o obvious seizure cause. On vanc and cefepime. Respiratory cxs pending. Urine cx showing e coli, awaiting sensitivities. Blood cxs with no growth to date. - Vanc and cefepime  - Follow up blood, and respiratory cultures - EEG pending   Tracheostomy dependent Patient with a history of chronic back pain who underwent spinal surgery  x2 with initial benefit that this was followed by progressively worsening weakness in his lower extremities and frequent bloodstream infections.  It was determined that the hardware in his spine was seeded by these infections or the cause.  He continues to have progressively worsening weakness that involved his respiratory function and was requiring intubation frequently.  November 2020 a trach was placed to avoid reintubation and was planned to be temporary.  He has been trach dependent for 3 years.  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 - PCCM is on board to manage trach   Pressure Ulcers Stage 1 ulcer of the sacral region Patient has known ulcers due to bedbound status that were present prior to admission.  Wound care consulted. -seen by [facility] nurse this morning. Appreciate their assistance.   Diet: Tube Feeds VTE: DOAC IVF:  None,None Code: Full   Prior to Admission Living Arrangement:  [place] Anticipated Discharge Location:  [place] Barriers to Discharge: Seizure/infective workup   Dispo: Admit patient to Inpatient with expected length of stay greater than 2 midnights.  Linward Natal MD Internal Medicine Resident PGY-1 Please contact the on call pager after 5 pm and on weekends at [phone].

## 2022-06-16 NOTE — Progress Notes (Signed)
EEG complete - results pending 

## 2022-06-16 NOTE — Progress Notes (Signed)
Pharmacy Antibiotic Note  Andrew Rivera is a 71 y.o. male for which pharmacy has been consulted for vancomycin dosing for pneumonia.  Patient with a history of trach/vent dependent, G-tube dependent, quadriplegia, ileus, pressure ulcers from [facility]. Patient presenting with AMS.  Per [facility] paperwork meropenem 7 day course to begin today.  SCr <0.3  Random vanc level came back at 7 after about 24hrs loading dose. We will change vanc to 1g IV q12. We will get 2 levels in 2 days to do AUC kinetics.  Plan: Vanc 1g IV q12 Levels in 2 days  Height: 5\' 8"  (172.7 cm) Weight: 77.6 kg (171 lb) IBW/kg (Calculated) : 68.4  Temp (24hrs), Avg:98.9 F (37.2 C), Min:98.1 F (36.7 C), Max:99.9 F (37.7 C)  Recent Labs  Lab 06/15/22 1341 06/16/22 0134 06/16/22 1545  WBC 14.8* 11.5*  --   CREATININE <0.30* <0.30*  --   LATICACIDVEN 1.2  --   --   VANCORANDOM  --   --  7    CrCl cannot be calculated (This lab value cannot be used to calculate CrCl because it is not a number: <0.30).    Allergies  Allergen Reactions   Chlorhexidine Itching and Rash   Codeine Nausea Only   Oxycodone-Acetaminophen Nausea Only    Other reaction(s): Hallucinations    Antimicrobials this admission: vancomycin 8/24 >>  cefepime 8/24 >>  Microbiology results: 8/24 BCx - ngtd 8/24 resp panel - neg 8/24 UCx -  8/24 mrsa +  9/24, PharmD, BCIDP, AAHIVP, CPP Infectious Disease Pharmacist 06/16/2022 5:01 PM

## 2022-06-16 NOTE — Consult Note (Signed)
WOC Nurse Consult Note: Reason for Consult:Stage 1 pressure injury, POA Wound type:Pressure Pressure Injury POA: Yes Measurement:To be provided by Bedside RN today Wound bed:N/A Drainage (amount, consistency, odor) N/A Periwound:intact, dry Dressing procedure/placement/frequency:I have provided Nursing with guidance for the care of this area using a sacral foam dressing to be lifted each shift for assessment of the area. Turning and repositioning is in place and will continue, minimizing time in the supine position. Heels are to be floated using Prevalon Boots.Patient is on a mattress replacement with low air loss feature while in ICU.  Discussed with Bedside RN S. Willis via [employer]. She will place measurements for the Stage 1 PI on the Flow Sheet.  WOC nursing team will not follow, but will remain available to this patient, the nursing and medical teams.  Please re-consult if needed.  Thank you for inviting [place] to participate in this patient's Plan of Care.  Ladona Mow, MSN, RN, CNS, GNP, Leda Min, [employer], [employer] phone:  [phone]

## 2022-06-16 NOTE — Progress Notes (Signed)
SLP Cancellation Note  Patient Details Name: Andrew Rivera MRN: [id number] DOB: July 14, 1951   Cancelled treatment:       Reason Eval/Treat Not Completed: Other (comment). Pt does not use PMSV or eat or drink orally at [facility]. Discussed with Dr. Katrinka Blazing who agrees to d/c SLP orders, defer to [facility] providers as pt likely to return soon.     Andrew Rivera, [name] 06/16/2022, 11:00 AM

## 2022-06-17 LAB — CBC WITH DIFFERENTIAL/PLATELET
Abs Immature Granulocytes: 0.1 10*3/uL — ABNORMAL HIGH (ref 0.00–0.07)
Basophils Absolute: 0.1 10*3/uL (ref 0.0–0.1)
Basophils Relative: 1 %
Eosinophils Absolute: 0.2 10*3/uL (ref 0.0–0.5)
Eosinophils Relative: 1 %
HCT: 40.9 % (ref 39.0–52.0)
Hemoglobin: 13.6 g/dL (ref 13.0–17.0)
Immature Granulocytes: 1 %
Lymphocytes Relative: 13 %
Lymphs Abs: 1.6 10*3/uL (ref 0.7–4.0)
MCH: 31.6 pg (ref 26.0–34.0)
MCHC: 33.3 g/dL (ref 30.0–36.0)
MCV: 94.9 fL (ref 80.0–100.0)
Monocytes Absolute: 1.3 10*3/uL — ABNORMAL HIGH (ref 0.1–1.0)
Monocytes Relative: 11 %
Neutro Abs: 9.3 10*3/uL — ABNORMAL HIGH (ref 1.7–7.7)
Neutrophils Relative %: 73 %
Platelets: 321 10*3/uL (ref 150–400)
RBC: 4.31 MIL/uL (ref 4.22–5.81)
RDW: 13.9 % (ref 11.5–15.5)
WBC: 12.4 10*3/uL — ABNORMAL HIGH (ref 4.0–10.5)
nRBC: 0 % (ref 0.0–0.2)

## 2022-06-17 LAB — GLUCOSE, CAPILLARY
Glucose-Capillary: 161 mg/dL — ABNORMAL HIGH (ref 70–99)
Glucose-Capillary: 163 mg/dL — ABNORMAL HIGH (ref 70–99)
Glucose-Capillary: 142 mg/dL — ABNORMAL HIGH (ref 70–99)
Glucose-Capillary: 189 mg/dL — ABNORMAL HIGH (ref 70–99)
Glucose-Capillary: 170 mg/dL — ABNORMAL HIGH (ref 70–99)

## 2022-06-17 LAB — BASIC METABOLIC PANEL
Anion gap: 10 (ref 5–15)
BUN: 17 mg/dL (ref 8–23)
CO2: 28 mmol/L (ref 22–32)
Calcium: 9.2 mg/dL (ref 8.9–10.3)
Chloride: 101 mmol/L (ref 98–111)
Creatinine, Ser: <0.3 mg/dL — ABNORMAL LOW (ref 0.61–1.24)
Glucose, Bld: 151 mg/dL — ABNORMAL HIGH (ref 70–99)
Potassium: 3.6 mmol/L (ref 3.5–5.1)
Sodium: 139 mmol/L (ref 135–145)

## 2022-06-17 LAB — MAGNESIUM
Magnesium: 2.1 mg/dL (ref 1.7–2.4)
Magnesium: 2.1 mg/dL (ref 1.7–2.4)

## 2022-06-17 LAB — URINE CULTURE: Culture: >=100000 — AB

## 2022-06-17 LAB — PHOSPHORUS
Phosphorus: 2.6 mg/dL (ref 2.5–4.6)
Phosphorus: 2.6 mg/dL (ref 2.5–4.6)

## 2022-06-17 MED ORDER — PIPERACILLIN-TAZOBACTAM 3.375 G IVPB
3.3750 g | Freq: Three times a day (TID) | INTRAVENOUS | Status: DC
  Administered 2022-06-17 – 2022-06-21 (×13): 3.375 g via INTRAVENOUS
  Filled 2022-06-17 (×13): qty 50

## 2022-06-17 NOTE — Progress Notes (Addendum)
HD#2 Subjective:   Overnight Events: No acute events overnight  Resting in bed comfortably. Asking to be repositioned, but overall feeling better than when he came in. Has not had any staring spells.   Objective:  Vital signs in last 24 hours: Vitals:   06/17/22 1533 06/17/22 1600 06/17/22 1700 06/17/22 1800  BP:  (!) 138/49 (!) 138/50 (!) 134/54  Pulse:  98 (!) 101 96  Resp:  18 (!) 21 18  Temp: 98.3 F (36.8 C)     TempSrc: Oral     SpO2:  97% 97% 97%  Weight:      Height:       Supplemental O2: Trach/Vent   Physical Exam:  Constitutional: Ill appearing HENT: normocephalic atraumatic, mucous membranes moist Eyes: conjunctiva non-erythematous. Trach in place.  Neck: supple Cardiovascular: regular rate and rhythm, no m/r/g Pulmonary/Chest: No respiratory distress.  Abdominal: soft, non-tender, tympanic. G-tube in place.  MSK: normal bulk and tone Neurological: alert & oriented x 3.  Skin: warm and dry Psych: pleasant  Filed Weights   06/15/22 1727 06/17/22 0500  Weight: 77.6 kg 80.5 kg     Intake/Output Summary (Last 24 hours) at 06/17/2022 1936 Last data filed at 06/17/2022 1800 Gross per 24 hour  Intake 2362.36 ml  Output 850 ml  Net 1512.36 ml   Net IO Since Admission: 3,112.8 mL [06/17/22 1936]  Pertinent Labs:    Latest Ref Rng & Units 06/17/2022    5:55 AM 06/16/2022    1:34 AM 06/15/2022    3:14 PM  CBC  WBC 4.0 - 10.5 K/uL 12.4  11.5    Hemoglobin 13.0 - 17.0 g/dL 93.2  35.5  73.2   Hematocrit 39.0 - 52.0 % 40.9  35.8  35.0   Platelets 150 - 400 K/uL 321  274         Latest Ref Rng & Units 06/17/2022    5:55 AM 06/16/2022    1:34 AM 06/15/2022    3:14 PM  CMP  Glucose 70 - 99 mg/dL 202  97    BUN 8 - 23 mg/dL 17  14    Creatinine 5.42 - 1.24 mg/dL <7.06  <2.37    Sodium 135 - 145 mmol/L 139  137  134   Potassium 3.5 - 5.1 mmol/L 3.6  3.6  3.8   Chloride 98 - 111 mmol/L 101  101    CO2 22 - 32 mmol/L 28  27    Calcium 8.9 - 10.3 mg/dL  9.2  9.0      Imaging: No results found.  Assessment/Plan:   Principal Problem:   Staring episodes Active Problems:   Chronic respiratory failure requiring continuous mechanical ventilation through tracheostomy (HCC)   Ventilator dependent (HCC)   Tracheostomy dependence (HCC)   Stage 1 skin ulcer of sacral region [facility])  Patient Summary: Andrew Rivera is a 71 y.o. person living with a history of racheostomy/ventilator, gastric tube dependency of unclear etiology, chronic back pain s/p surgical repair with post operative complications of infected hardware who lives at [facility] presents after staring spell and admitted for infectious vs seizure workup   Staring Spell Complicated UTI - E. Coli Pneumonia  Leukocytosis Pyuria, Bacteruria Urine cultures over 100,000 colonies of E. Coli, sensitive to zosyn, bactrim, macrobid, gentamicin, imipenem. Will complete three days of vanc/cefepime and transition to zosyn to treat complicated UTI for 7 days. EEG and MRI negative, less likely to be seizure causing spells. Has not had any  since starting abx and has improved back to baseline.  -vanc and cefepime day 3/3, zosyn day 1/7 -follow up blood, and respiratory cultures  Tracheostomy dependent RT attempted to wean this afternoon and patient became short of breath within 20 minutes. He had normal sniff test per his wife. Unable to perform EMG with his inability to wean off of trache. Likely has significantly weak diaphragmatic muscles, will need wean trials. - PCCM is on board to manage trach, appreciate their assistance   Pressure Ulcers Stage 1 ulcer of the sacral region Wound care appreciate their assistance  Tympanic Abdomen  Patient with soft, distended, tympanic abdomen today. Unclear if he able to appreciate tenderness. G-tube in place. Will continue bowel regimen. Addendum: Improved in the evening after BM's.  Diet: Tube Feeds IVF: None,None VTE: DOAC Code: Full Family Update:  wife at bedside  Dispo: Anticipated discharge to [facility] pending further treatment and management of his acute infection  Thalia Bloodgood DO Internal Medicine Resident PGY-3 Please contact the on call pager after 5 pm and on weekends at [phone].

## 2022-06-17 NOTE — Plan of Care (Signed)
  Problem: Education: Goal: Knowledge of General Education information will improve Description: Including pain rating scale, medication(s)/side effects and non-pharmacologic comfort measures Outcome: Progressing   Problem: Clinical Measurements: Goal: Respiratory complications will improve Outcome: Progressing Goal: Cardiovascular complication will be avoided Outcome: Progressing   Problem: Activity: Goal: Risk for activity intolerance will decrease Outcome: Progressing   Problem: Nutrition: Goal: Adequate nutrition will be maintained Outcome: Progressing   Problem: Coping: Goal: Level of anxiety will decrease Outcome: Progressing   

## 2022-06-17 NOTE — Progress Notes (Addendum)
RT note-Patient was attempted to wean this afternoon, PSV, unable to wean, patient states that he was short of breath, Placed back to full support.

## 2022-06-18 ENCOUNTER — Inpatient Hospital Stay (HOSPITAL_COMMUNITY): Payer: Medicare HMO

## 2022-06-18 DIAGNOSIS — R4182 Altered mental status, unspecified: Secondary | ICD-10-CM | POA: Diagnosis not present

## 2022-06-18 DIAGNOSIS — R404 Transient alteration of awareness: Secondary | ICD-10-CM | POA: Diagnosis not present

## 2022-06-18 LAB — COMPREHENSIVE METABOLIC PANEL
ALT: 30 U/L (ref 0–44)
ALT: 31 U/L (ref 0–44)
AST: 23 U/L (ref 15–41)
AST: 24 U/L (ref 15–41)
Albumin: 2.6 g/dL — ABNORMAL LOW (ref 3.5–5.0)
Albumin: 2.6 g/dL — ABNORMAL LOW (ref 3.5–5.0)
Alkaline Phosphatase: 111 U/L (ref 38–126)
Alkaline Phosphatase: 112 U/L (ref 38–126)
Anion gap: 7 (ref 5–15)
Anion gap: 9 (ref 5–15)
BUN: 16 mg/dL (ref 8–23)
BUN: 17 mg/dL (ref 8–23)
CO2: 31 mmol/L (ref 22–32)
CO2: 29 mmol/L (ref 22–32)
Calcium: 9 mg/dL (ref 8.9–10.3)
Calcium: 9.2 mg/dL (ref 8.9–10.3)
Chloride: 99 mmol/L (ref 98–111)
Chloride: 99 mmol/L (ref 98–111)
Creatinine, Ser: <0.3 mg/dL — ABNORMAL LOW (ref 0.61–1.24)
Creatinine, Ser: <0.3 mg/dL — ABNORMAL LOW (ref 0.61–1.24)
Glucose, Bld: 160 mg/dL — ABNORMAL HIGH (ref 70–99)
Glucose, Bld: 158 mg/dL — ABNORMAL HIGH (ref 70–99)
Potassium: 4.5 mmol/L (ref 3.5–5.1)
Potassium: 4.6 mmol/L (ref 3.5–5.1)
Sodium: 137 mmol/L (ref 135–145)
Sodium: 137 mmol/L (ref 135–145)
Total Bilirubin: <0.1 mg/dL — ABNORMAL LOW (ref 0.3–1.2)
Total Bilirubin: 0.4 mg/dL (ref 0.3–1.2)
Total Protein: 6.8 g/dL (ref 6.5–8.1)
Total Protein: 6.9 g/dL (ref 6.5–8.1)

## 2022-06-18 LAB — CBC WITH DIFFERENTIAL/PLATELET
Abs Immature Granulocytes: 0.16 10*3/uL — ABNORMAL HIGH (ref 0.00–0.07)
Abs Immature Granulocytes: 0.27 10*3/uL — ABNORMAL HIGH (ref 0.00–0.07)
Basophils Absolute: 0 10*3/uL (ref 0.0–0.1)
Basophils Absolute: 0.1 10*3/uL (ref 0.0–0.1)
Basophils Relative: 0 %
Basophils Relative: 0 %
Eosinophils Absolute: 0 10*3/uL (ref 0.0–0.5)
Eosinophils Absolute: 0 10*3/uL (ref 0.0–0.5)
Eosinophils Relative: 0 %
Eosinophils Relative: 0 %
HCT: 39.8 % (ref 39.0–52.0)
HCT: 39.3 % (ref 39.0–52.0)
Hemoglobin: 13.2 g/dL (ref 13.0–17.0)
Hemoglobin: 12.7 g/dL — ABNORMAL LOW (ref 13.0–17.0)
Immature Granulocytes: 1 %
Immature Granulocytes: 2 %
Lymphocytes Relative: 9 %
Lymphocytes Relative: 5 %
Lymphs Abs: 1.1 10*3/uL (ref 0.7–4.0)
Lymphs Abs: 0.7 10*3/uL (ref 0.7–4.0)
MCH: 32 pg (ref 26.0–34.0)
MCH: 31.4 pg (ref 26.0–34.0)
MCHC: 33.2 g/dL (ref 30.0–36.0)
MCHC: 32.3 g/dL (ref 30.0–36.0)
MCV: 96.6 fL (ref 80.0–100.0)
MCV: 97.3 fL (ref 80.0–100.0)
Monocytes Absolute: 1 10*3/uL (ref 0.1–1.0)
Monocytes Absolute: 0.7 10*3/uL (ref 0.1–1.0)
Monocytes Relative: 8 %
Monocytes Relative: 5 %
Neutro Abs: 9.6 10*3/uL — ABNORMAL HIGH (ref 1.7–7.7)
Neutro Abs: 12.5 10*3/uL — ABNORMAL HIGH (ref 1.7–7.7)
Neutrophils Relative %: 82 %
Neutrophils Relative %: 88 %
Platelets: 310 10*3/uL (ref 150–400)
Platelets: 348 10*3/uL (ref 150–400)
RBC: 4.12 MIL/uL — ABNORMAL LOW (ref 4.22–5.81)
RBC: 4.04 MIL/uL — ABNORMAL LOW (ref 4.22–5.81)
RDW: 14 % (ref 11.5–15.5)
RDW: 14.2 % (ref 11.5–15.5)
WBC: 11.9 10*3/uL — ABNORMAL HIGH (ref 4.0–10.5)
WBC: 14.1 10*3/uL — ABNORMAL HIGH (ref 4.0–10.5)
nRBC: 0 % (ref 0.0–0.2)
nRBC: 0 % (ref 0.0–0.2)

## 2022-06-18 LAB — GLUCOSE, CAPILLARY
Glucose-Capillary: 138 mg/dL — ABNORMAL HIGH (ref 70–99)
Glucose-Capillary: 139 mg/dL — ABNORMAL HIGH (ref 70–99)
Glucose-Capillary: 154 mg/dL — ABNORMAL HIGH (ref 70–99)
Glucose-Capillary: 155 mg/dL — ABNORMAL HIGH (ref 70–99)
Glucose-Capillary: 163 mg/dL — ABNORMAL HIGH (ref 70–99)
Glucose-Capillary: 181 mg/dL — ABNORMAL HIGH (ref 70–99)
Glucose-Capillary: 186 mg/dL — ABNORMAL HIGH (ref 70–99)

## 2022-06-18 LAB — CK: Total CK: 28 U/L — ABNORMAL LOW (ref 49–397)

## 2022-06-18 LAB — LACTIC ACID, PLASMA
Lactic Acid, Venous: 1.4 mmol/L (ref 0.5–1.9)
Lactic Acid, Venous: 1.3 mmol/L (ref 0.5–1.9)

## 2022-06-18 LAB — CULTURE, RESPIRATORY W GRAM STAIN: Gram Stain: NONE SEEN

## 2022-06-18 LAB — PHOSPHORUS: Phosphorus: 2.5 mg/dL (ref 2.5–4.6)

## 2022-06-18 LAB — MAGNESIUM: Magnesium: 1.9 mg/dL (ref 1.7–2.4)

## 2022-06-18 MED ORDER — INSULIN ASPART 100 UNIT/ML IJ SOLN
0.0000 [IU] | INTRAMUSCULAR | Status: DC
  Administered 2022-06-18 – 2022-06-19 (×3): 2 [IU] via SUBCUTANEOUS
  Administered 2022-06-19 (×2): 1 [IU] via SUBCUTANEOUS
  Administered 2022-06-19: 2 [IU] via SUBCUTANEOUS
  Administered 2022-06-20 – 2022-06-21 (×7): 1 [IU] via SUBCUTANEOUS

## 2022-06-18 MED ORDER — MIDODRINE HCL 5 MG PO TABS
5.0000 mg | ORAL_TABLET | Freq: Three times a day (TID) | ORAL | Status: DC
Start: 2022-06-18 — End: 2022-06-21
  Administered 2022-06-19 – 2022-06-21 (×8): 5 mg
  Filled 2022-06-18 (×8): qty 1

## 2022-06-18 NOTE — Progress Notes (Signed)
Pt. Noted with blank upward stare, but responds when spoken to able to track with eyes. Appeared to be chewing with mouth, mouth cleaning with swabs but pt kept repeated motions. MD made aware.

## 2022-06-18 NOTE — Progress Notes (Signed)
eLink Physician-Brief Progress Note Patient Name: Andrew Rivera DOB: Sep 11, 1951 MRN: [id number]   Date of Service  06/18/2022  HPI/Events of Note  CBG's 130's - 180's, on vent with tube feeding  eICU Interventions  Started on low dose sliding scale insulin q 4     Intervention Category Intermediate Interventions: Hyperglycemia - evaluation and treatment  Rosalie Gums Keary Hanak 06/18/2022, 11:37 PM

## 2022-06-18 NOTE — Progress Notes (Signed)
EEG complete - results pending 

## 2022-06-18 NOTE — Progress Notes (Cosign Needed Addendum)
HD#3 Subjective:   Overnight Events: episodes of rightward gaze and delayed responsiveness  On evaluation today, patient with rightward gaze that wife states similar in appearance to what brought him into the hospital. She notes that he is redirectable and able to focused if prompted multiple times. Unclear of what time this started, but last known baseline was 2130 yesterday. On further discussions with patient's wife, he has had neurological decline over the past week; worsening upper extremity strength. Prior to this he was using tablets and able to communicate with his right hand.   Objective:  Vital signs in last 24 hours: Vitals:   06/18/22 1600 06/18/22 1700 06/18/22 1800 06/18/22 1937  BP: (!) 140/55 (!) 143/59 (!) 145/61   Pulse: (!) 104 (!) 105 (!) 106   Resp: 16 18 18    Temp:    (!) 100.7 F (38.2 C)  TempSrc:    Oral  SpO2: 98% 98% 98%   Weight:      Height:       Supplemental O2: Trach/Vent   Physical Exam:  Constitutional: Ill appearing HENT: normocephalic atraumatic, mucous membranes moist. Tongue fasciculations  Eyes: conjunctiva non-erythematous. Visual fields intact. Blink reflex absent Neck: supple. Trach in place. Cardiovascular: Tachycardic, no m/r/g Pulmonary/Chest: No respiratory distress.  Abdominal: soft, non-tender, tympanic. G-tube in place.  MSK: Flaccid upper extremities. Unable to follow commands of bilateral upper and lower extremities Neurological: alert & oriented x 3. Gaze deviation, but redirectable. PERRL. Peripheral vision intact. Brachioradialis reflex 0 bilaterally. Able to mouth answers Skin: warm and dry. Mottled skin knees bilaterally.  Psych: pleasant  Filed Weights   06/15/22 1727 06/17/22 0500 06/18/22 0600  Weight: 77.6 kg 80.5 kg 82.7 kg     Intake/Output Summary (Last 24 hours) at 06/18/2022 1946 Last data filed at 06/18/2022 1800 Gross per 24 hour  Intake 2528.55 ml  Output 600 ml  Net 1928.55 ml    Net IO Since  Admission: 5,175.9 mL [06/18/22 1946]  Pertinent Labs:    Latest Ref Rng & Units 06/18/2022   12:29 PM 06/18/2022    5:05 AM 06/17/2022    5:55 AM  CBC  WBC 4.0 - 10.5 K/uL 14.1  11.9  12.4   Hemoglobin 13.0 - 17.0 g/dL 06/19/2022  61.4  43.1   Hematocrit 39.0 - 52.0 % 39.3  39.8  40.9   Platelets 150 - 400 K/uL 348  310  321        Latest Ref Rng & Units 06/18/2022   12:29 PM 06/18/2022    5:05 AM 06/17/2022    5:55 AM  CMP  Glucose 70 - 99 mg/dL 06/19/2022  086  761   BUN 8 - 23 mg/dL 17  16  17    Creatinine 0.61 - 1.24 mg/dL 950   <9.32   Sodium 135 - 145 mmol/L 137  137  139   Potassium 3.5 - 5.1 mmol/L 4.6  4.5  3.6   Chloride 98 - 111 mmol/L 99  99  101   CO2 22 - 32 mmol/L 29  31  28    Calcium 8.9 - 10.3 mg/dL 9.2  9.0  9.2   Total Protein 6.5 - 8.1 g/dL 6.9  6.8    Total Bilirubin 0.3 - 1.2 mg/dL 0.4  <6.71    Alkaline Phos 38 - 126 U/L 112  111    AST 15 - 41 U/L 24  23    ALT 0 - 44 U/L 31  30      Imaging: EEG adult  Result Date: 06/18/2022 Charlsie Quest, MD     06/18/2022  4:17 PM Patient Name: Andrew Rivera MRN: [id number] Epilepsy Attending: Charlsie Quest Referring Physician/Provider: Belva Agee, MD Date: 06/18/2022 Duration: 24.40 mins Patient history: 71yo M with a staring spell. EEG to evaluate for seizure Level of alertness: Awake AEDs during EEG study: PGB Technical aspects: This EEG study was done with scalp electrodes positioned according to the 10-20 International system of electrode placement. Electrical activity was reviewed with band pass filter of 1-70Hz , sensitivity of 7 uV/mm, display speed of 68mm/sec with a 60Hz  notched filter applied as appropriate. EEG data were recorded continuously and digitally stored.  Video monitoring was available and reviewed as appropriate. Description: No clear posterior dominant rhythm was seen. EEG showed continuous generalized predominantly 5 to 7 Hz theta slowing admixed with intermittent 2-3hz  delta slowing.  Hyperventilation and photic stimulation were not performed.   ABNORMALITY - Continuous slow, generalized IMPRESSION: This study is suggestive of moderate diffuse encephalopathy, nonspecific etiology. No seizures or epileptiform discharges were seen throughout the recording.  If suspicion for interictal activity remains a concern, a prolonged study can be considered. Priyanka    Assessment/Plan:   Principal Problem:   Staring episodes Active Problems:   Chronic respiratory failure requiring continuous mechanical ventilation through tracheostomy (HCC)   Ventilator dependent (HCC)   Tracheostomy dependence (HCC)   Stage 1 skin ulcer of sacral region [facility])  Patient Summary: Andrew Rivera is a 71 y.o. person living with a history of racheostomy/ventilator, gastric tube dependency of unclear etiology, chronic back pain s/p surgical repair with post operative complications of infected hardware who lives at [facility] presents after staring spell and admitted for infectious vs seizure workup   Staring Spell Tongue fasciculations Subsyndromal delirium  Repeat staring spells episode today with gaze deviation that is redirectable and tongue fasciculations. Patient able to mouth answers but unclear if responding appropriately. Repeat EEG negative for seizure like activity. Possible etiologies being infections as per below, polypharmacy with multiple centrally acting medications. Of note, recently had zofran added to regimen. Other etiologies would include underlying neurological disease process causing his extremity weakness and possibly ventilator dependence. Discontinued hydroxyzine and loratadine. Vital signs have remained stable and he is still responsive. He does have evidence of lower motor neuron symptoms with flaccid extremities, tongue fasciculations. Repeated blood cultures with worsening status.  -consult neurology tomorrow  -treat infections as per below  Flaccid upper  extremities Profound extremity weakness Nerve conduction study performed in 2020 at [facility] after weakness thought to be secondary to prolonged ICU stay. He was found to have profound left sided weakness with some upper extremity weakness. He had an abnormal EMG consistent with myopathy with membrane irritability. It appears discussions were had in regards to muscle biopsy but unclear if ever pursued. Myositis panel was negative in 2020. With him now having an inability to move his right upper extremity in the setting of delirium with staring spells, question underlying progressive neurological/msk etiology. Again, negative autoimmune myositis panel. Plan to consult neurology for further assistance.   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 Likely in setting of acute on chronic illness.   Complicated UTI - E. Coli Pneumonia  Leukocytosis Pyuria, Bacteruria Urine cultures over 100,000 colonies of E. Coli, sensitive to zosyn, bactrim, macrobid, gentamicin, imipenem. Started on zosyn 06/17/22 -vanc and cefepime day 3/3, zosyn day 2/7 -  follow up blood, and respiratory cultures  Tracheostomy dependent RT attempted to wean on 8/27 and patient became short of breath within 20 minutes. He had normal sniff test per his wife. Unable to perform EMG with his inability to wean off of trache. Likely has significantly weak diaphragmatic muscles, will need wean trials. - PCCM following and assisting with vent/trach care - Assure obturator in room   Pressure Ulcers Stage 1 ulcer of the sacral region Wound care appreciate their assistance  Tympanic Abdomen  Patient with soft, distended, tympanic abdomen today. Large bowel movement yesterday. Do not suspect underlying abdominal infection contributing to above. Will continue to monitor.   Diet: Tube Feeds IVF: None,None VTE: DOAC Code: Full Family Update: wife at bedside  Dispo: Anticipated  discharge to [facility] pending further treatment and management of his acute infection  Thalia Bloodgood DO Internal Medicine Resident PGY-3 Please contact the on call pager after 5 pm and on weekends at [phone].

## 2022-06-18 NOTE — Procedures (Addendum)
Patient Name: Andrew Rivera  MRN: [id number]  Epilepsy Attending: Charlsie Quest  Referring Physician/Provider: Belva Agee, MD Date: 06/18/2022 Duration: 24.40 mins  Patient history: 71yo M with a staring spell. EEG to evaluate for seizure  Level of alertness: Awake  AEDs during EEG study: PGB  Technical aspects: This EEG study was done with scalp electrodes positioned according to the 10-20 International system of electrode placement. Electrical activity was reviewed with band pass filter of 1-70Hz , sensitivity of 7 uV/mm, display speed of 62mm/sec with a 60Hz  notched filter applied as appropriate. EEG data were recorded continuously and digitally stored.  Video monitoring was available and reviewed as appropriate.  Description: No clear posterior dominant rhythm was seen. EEG showed continuous generalized predominantly 5 to 7 Hz theta slowing admixed with intermittent 2-3hz  delta slowing. Hyperventilation and photic stimulation were not performed.     ABNORMALITY - Continuous slow, generalized  IMPRESSION: This study is suggestive of moderate diffuse encephalopathy, nonspecific etiology. No seizures or epileptiform discharges were seen throughout the recording.   If suspicion for interictal activity remains a concern, a prolonged study can be considered.   [name] 

## 2022-06-19 ENCOUNTER — Inpatient Hospital Stay (HOSPITAL_COMMUNITY): Payer: Medicare HMO

## 2022-06-19 DIAGNOSIS — R253 Fasciculation: Secondary | ICD-10-CM | POA: Diagnosis not present

## 2022-06-19 DIAGNOSIS — R404 Transient alteration of awareness: Secondary | ICD-10-CM | POA: Diagnosis not present

## 2022-06-19 DIAGNOSIS — J9610 Chronic respiratory failure, unspecified whether with hypoxia or hypercapnia: Secondary | ICD-10-CM | POA: Diagnosis not present

## 2022-06-19 DIAGNOSIS — Z9911 Dependence on respirator [ventilator] status: Secondary | ICD-10-CM | POA: Diagnosis not present

## 2022-06-19 DIAGNOSIS — R231 Pallor: Secondary | ICD-10-CM

## 2022-06-19 DIAGNOSIS — J189 Pneumonia, unspecified organism: Secondary | ICD-10-CM

## 2022-06-19 DIAGNOSIS — G8100 Flaccid hemiplegia affecting unspecified side: Secondary | ICD-10-CM | POA: Diagnosis not present

## 2022-06-19 DIAGNOSIS — R531 Weakness: Secondary | ICD-10-CM

## 2022-06-19 DIAGNOSIS — N390 Urinary tract infection, site not specified: Secondary | ICD-10-CM

## 2022-06-19 DIAGNOSIS — Z930 Tracheostomy status: Secondary | ICD-10-CM | POA: Diagnosis not present

## 2022-06-19 DIAGNOSIS — B9620 Unspecified Escherichia coli [E. coli] as the cause of diseases classified elsewhere: Secondary | ICD-10-CM

## 2022-06-19 LAB — CBC WITH DIFFERENTIAL/PLATELET
Abs Immature Granulocytes: 0.24 10*3/uL — ABNORMAL HIGH (ref 0.00–0.07)
Basophils Absolute: 0.1 10*3/uL (ref 0.0–0.1)
Basophils Relative: 0 %
Eosinophils Absolute: 0 10*3/uL (ref 0.0–0.5)
Eosinophils Relative: 0 %
HCT: 37.8 % — ABNORMAL LOW (ref 39.0–52.0)
Hemoglobin: 12.1 g/dL — ABNORMAL LOW (ref 13.0–17.0)
Immature Granulocytes: 2 %
Lymphocytes Relative: 5 %
Lymphs Abs: 0.8 10*3/uL (ref 0.7–4.0)
MCH: 31.3 pg (ref 26.0–34.0)
MCHC: 32 g/dL (ref 30.0–36.0)
MCV: 97.9 fL (ref 80.0–100.0)
Monocytes Absolute: 0.9 10*3/uL (ref 0.1–1.0)
Monocytes Relative: 6 %
Neutro Abs: 12.5 10*3/uL — ABNORMAL HIGH (ref 1.7–7.7)
Neutrophils Relative %: 87 %
Platelets: 285 10*3/uL (ref 150–400)
RBC: 3.86 MIL/uL — ABNORMAL LOW (ref 4.22–5.81)
RDW: 14.3 % (ref 11.5–15.5)
WBC: 14.4 10*3/uL — ABNORMAL HIGH (ref 4.0–10.5)
nRBC: 0 % (ref 0.0–0.2)

## 2022-06-19 LAB — BASIC METABOLIC PANEL
Anion gap: 6 (ref 5–15)
BUN: 19 mg/dL (ref 8–23)
CO2: 32 mmol/L (ref 22–32)
Calcium: 8.8 mg/dL — ABNORMAL LOW (ref 8.9–10.3)
Chloride: 99 mmol/L (ref 98–111)
Creatinine, Ser: <0.3 mg/dL — ABNORMAL LOW (ref 0.61–1.24)
Glucose, Bld: 156 mg/dL — ABNORMAL HIGH (ref 70–99)
Potassium: 4.3 mmol/L (ref 3.5–5.1)
Sodium: 137 mmol/L (ref 135–145)

## 2022-06-19 LAB — GLUCOSE, CAPILLARY
Glucose-Capillary: 158 mg/dL — ABNORMAL HIGH (ref 70–99)
Glucose-Capillary: 136 mg/dL — ABNORMAL HIGH (ref 70–99)
Glucose-Capillary: 158 mg/dL — ABNORMAL HIGH (ref 70–99)
Glucose-Capillary: 157 mg/dL — ABNORMAL HIGH (ref 70–99)
Glucose-Capillary: 119 mg/dL — ABNORMAL HIGH (ref 70–99)
Glucose-Capillary: 133 mg/dL — ABNORMAL HIGH (ref 70–99)

## 2022-06-19 MED ORDER — MUPIROCIN 2 % EX OINT
TOPICAL_OINTMENT | Freq: Two times a day (BID) | CUTANEOUS | Status: DC
  Administered 2022-06-19: 1 via NASAL
  Filled 2022-06-19 (×2): qty 22

## 2022-06-19 NOTE — Progress Notes (Addendum)
Subjective:  Patient was responding verbally to verbal questioning. He was able to follow commands appropriately.  Discussed our plan to consult neurology for their opinion.   Objective:  Vital signs in last 24 hours: Vitals:   06/19/22 1300 06/19/22 1400 06/19/22 1500 06/19/22 1510  BP: (!) 141/51 (!) 135/52 (!) 126/46   Pulse: (!) 104 99 91 89  Resp: 18 18 17 17   Temp:    98.6 F (37 C)  TempSrc:    Axillary  SpO2: 99% 100% 99% 99%  Weight:      Height:       Physical Exam: Constitutional: chronically ill-appearing HENT: mucous membranes moist. Tongue fasciculations present.  Eyes: normal conjunctivae. EOMI intact. PERRL. Blink reflex absent Neck: supple. Trach in place. Cardiovascular: regular rate and rhythm. No murmurs, rubs, or gallops.  Pulmonary: No respiratory distress. No wheezes, rales, or rhonchi.  Abdominal: soft, non-tender. Mildly tympanic. G-tube in place.  MSK: Flaccid upper extremities. Unable to lift bilateral upper and lower extremities. Able to wiggle toes bilaterally.  Neurological: alert & oriented x 3. Gaze deviation, but redirectable. Able to mouth answers. Brachioradialis reflex 0 bilaterally. Skin: warm and dry. Mottled skin on knees bilaterally.      Assessment/Plan:  Principal Problem:   Staring episodes Active Problems:   Chronic respiratory failure requiring continuous mechanical ventilation through tracheostomy (HCC)   Ventilator dependent (HCC)   Tracheostomy dependence (HCC)   Stage 1 skin ulcer of sacral region [facility])  Andrew Rivera is a 71 y/o male with past medical history of tracheostomy/ventilator, gastric tube dependency of unclear etiology, chronic back pain s/p surgical repair w/ post operative complications of infected hardware who lives at [facility], presented with staring spells and was admitted for infectious vs seizure workup.    # Staring Spell # Tongue fasciculations # Subsyndromal delirium  Signs of lower motor neuron  symptoms including flaccid extremities and tongue fasciculations. Repeat EEG negative for seizure like activity. Potentially due to polypharmacy of centrally-acting medications or underlying neurological disease. Hydroxyzine and Loratadine discontinued. Initial blood cultures negative at 4 days. Repeat blood cultures negative at 24 hours. Vital signs remain stable and patient continues to be responsive. Neurology consulted and is concerned for potential critical illness myopathy versus polyneuropathy.  Neurology recommends EMG and muscle biopsy as an outpatient preferably at an academic institution that is well versed in MSK/neurologic conditions.  Neurology recommends continuing pregabalin. - Treat infection as stated below   2. # Flaccid upper extremities # Profound extremity weakness Patient received nerve conduction study in 2020 [facility]) for left-sided weakness and upper extremity weakness.  EMG at the time of normal consistent with myopathy with membrane irritability. Autoimmune myositis panel negative in 2020. Chart review demonstrates that it was discussed that the patient should get a muscle biopsy but uncertain if this option was ever pursued.  Potentially underlying neurological/MSK disorder. Neurology recommendations as stated above.    3. 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  Likely secondary to chronic illness.   4. # Complicated UTI - E. Coli # Pneumonia  # Leukocytosis # Pyuria, Bacteruria Urine culture demonstrates > 100K colonies of E. Coli (sensitive to zosyn, bactrim, macrobid, gentamicin, imipenem).  Patient completed 3-day course of Vanc and cefepime on 8/27. Zosyn initiated on 8/26.  Respiratory culture negative with the exception of a few Klebsiella pneumonia. - Zosyn day 3/7 - Continue monitoring blood cultures   5. # Tracheostomy dependent Unsuccessful attempt to wean the patient  off of trach on 8/27. Unable to perform EMG due  to inability to wean off of trach. Likely has weakened diaphragmatic muscles. PCCM following. - Continue wean trials   6. # Pressure Ulcers # Stage 1 ulcer of the sacral region Wound care following.    7. # Tympanic Abdomen  Patient's abdomen is mildly tympanic.  Patient had a bowel movement today. - Will continue to monitor.    Diet: Tube Feeds IVF: None  VTE: DOAC Code: Full   Dispo: Anticipated discharge to [facility] pending further treatment and management of his acute infection  Prior to Admission Living Arrangement: [facility] Anticipated Discharge Location: [facility] Barriers to Discharge: continued management Dispo: Anticipated discharge in approximately more than 2 day(s).   Karoline Caldwell, MD 06/19/2022, 3:49 PM Pager: [phone] After 5pm on weekdays and 1pm on weekends: On Call pager [phone]

## 2022-06-19 NOTE — Progress Notes (Signed)
eLink Physician-Brief Progress Note Patient Name: RYMAN RATHGEBER DOB: 1951-06-10 MRN: [id number]   Date of Service  06/19/2022  HPI/Events of Note  Bladder scan 400, need I/O cath order  eICU Interventions  In and out cath ordered     Intervention Category Intermediate Interventions: Other:  Darl Pikes 06/19/2022, 12:10 AM

## 2022-06-19 NOTE — Progress Notes (Signed)
RT attempted to wean pt on PSV 14/5 per MD request. Although RR and VT were in reasonable range pt kept complaining of trouble breathing. RT placed pt back on full vent support.

## 2022-06-19 NOTE — Progress Notes (Signed)
NAME:  Andrew Rivera, MRN:  [id number], DOB:  01-19-1951, LOS: 4 ADMISSION DATE:  06/15/2022, CONSULTATION DATE: 06/15/2022 REFERRING MD: Dr. 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  Resides at [facility].  He was brought to the ED 8/24 with flushing, diaphoresis and altered mental status.  He apparently experienced a staring spell that was witnessed.  Initially poorly responsive following the spell and then slowly return to normal mental status.  Evaluation in the ED noted leukocytosis, pyuria.  He had some increased green secretions with question bronchitis versus pneumonia.  He has been placed on antibiotics and evaluation, cultures ongoing.  Neurology has recommended EEG, MRI brain.  PCCM consulted regarding his tracheostomy and ventilator dependence.  Pertinent  Medical History   Past Medical History:  Diagnosis Date   Acute on chronic respiratory failure with hypoxia (HCC)    Altered mental status, unspecified    Chronic pain syndrome    Critical illness myopathy    Internal jugular (IJ) vein thromboembolism, acute, unspecified laterality (HCC)    Septic shock (HCC)     Significant Hospital Events: Including procedures, antibiotic start and stop dates in addition to other pertinent events   8/24 admitted 8/27 negative spot EEG  Interim History / Subjective:  Today he has been sleepy but arousable with significant stimulation. Tmax 100.7. Urinary retention overnight requiring straight cath.  Objective   Blood pressure (!) 130/56, pulse (!) 107, temperature 99.1 F (37.3 C), temperature source Oral, resp. rate 18, height 5\' 8"  (1.727 m), weight 82.7 kg, SpO2 99 %.    Vent Mode: PRVC FiO2 (%):  [40 %] 40 % Set Rate:  [18 bmp] 18 bmp Vt Set:  [540 mL] 540  mL PEEP:  [5 cmH20] 5 cmH20 Plateau Pressure:  [16 cmH20-18 cmH20] 18 cmH20   Intake/Output Summary (Last 24 hours) at 06/19/2022 0754 Last data filed at 06/19/2022 0600 Gross per 24 hour  Intake 1593.6 ml  Output 650 ml  Net 943.6 ml    Filed Weights   06/17/22 0500 06/18/22 0600 06/19/22 0500  Weight: 80.5 kg 82.7 kg 82.7 kg    Examination: Ill-appearing man lying in bed no acute distress [place]/AT, eyes anicteric Trach in place-Shiley #6 distal XLT Scattered rhonchi, improved with suctioning, breathing comfortably on the vent.  Tan but thin secretions. S1-S2, regular rate and rhythm Abdomen soft, nontender.  No erythema around PEG. No significant lower extremity edema. No diffuse rashes.  Minimal mottling just on the anterior surface of his knees, still warm.  EEG> moderate diffuse encephalopathy, no seizures or epileptiform discharges Blood cultures> NGTD Resp culture> K. Pneumoniae  Resolved Hospital Problem list     Assessment & Plan:  Chronic respiratory failure with ventilator dependence apparently due to neuromuscular weakness, respiratory muscle weakness.  Cause not entirely clear.  Is being evaluated for phrenic nerve palsies.  Question whether this is the sequela of severe critical illness polymyopathy.  States that he is on 0.40, PEEP 5 as his baseline settings.  Defer further vent wean efforts to his LTACH.  Klebsiella pneumoniae pneumonia- 7 days of zosyn will also cover this E Coli UTI with urinary retention- con't zosyn (resistant to cephalosporins) Question seizures- per primary  -Continue vent support, okay with vent weaning efforts.  Given that he has required mechanical ventilation for 3 years,  I think it is unlikely that he will quickly come off the ventilator. - Zosyn, require 7-day course. - Follow cultures and tailor antibiotics as necessary - Consider neurology evaluation and continuous EEG monitoring if ongoing concern for seizures.  This patient is  critically ill with multiple organ system failure which requires frequent high complexity decision making, assessment, support, evaluation, and titration of therapies. This was completed through the application of advanced monitoring technologies and extensive interpretation of multiple databases. During this encounter critical care time was devoted to patient care services described in this note for 33 minutes.  Steffanie Dunn, DO 06/19/22 10:41 AM [facility]

## 2022-06-19 NOTE — TOC Progression Note (Signed)
Transition of Care [facility]) - Initial/Assessment Note    Patient Details  Name: Andrew Rivera MRN: [id number] Date of Birth: 1951/09/05  Transition [facility] CM/SW Contact:    Ralene Bathe, LCSWA Phone Number: 06/19/2022, 2:40 PM  Clinical Narrative:                 CSW spoke with admissions at [facility].  The patient can return when medically ready.  TOC will continue to follow.         Patient Goals and CMS Choice        Expected Discharge Plan and Services                                                Prior Living Arrangements/Services                       Activities of Daily Living Home Assistive Devices/Equipment: Vent/Trach supplies, Enteral Feeding Supplies, Hospital bed ADL Screening (condition at time of admission) Patient's cognitive ability adequate to safely complete daily activities?: Yes Is the patient deaf or have difficulty hearing?: No Does the patient have difficulty seeing, even when wearing glasses/contacts?: No Does the patient have difficulty concentrating, remembering, or making decisions?: No Patient able to express need for assistance with ADLs?: Yes Does the patient have difficulty dressing or bathing?: Yes Independently performs ADLs?: No Communication: Independent Dressing (OT): Dependent Is this a change from baseline?: Pre-admission baseline Grooming: Dependent Is this a change from baseline?: Pre-admission baseline Feeding: Dependent Is this a change from baseline?: Pre-admission baseline Toileting: Dependent Is this a change from baseline?: Pre-admission baseline In/Out Bed: Dependent Is this a change from baseline?: Pre-admission baseline Walks in Home: Dependent Is this a change from baseline?: Pre-admission baseline Does the patient have difficulty walking or climbing stairs?: Yes Weakness of Legs: Both Weakness of Arms/Hands: Both  Permission Sought/Granted                  Emotional  Assessment              Admission diagnosis:  Altered mental status, unspecified altered mental status type [R41.82] Staring episodes [R40.4] Patient Active Problem List   Diagnosis Date Noted   Stage 1 skin ulcer of sacral region (HCC) 06/16/2022   Staring episodes 06/15/2022   Malnutrition of moderate degree 06/30/2021   Left inguinal hernia 06/29/2021   Dysautonomia (HCC)    Ventilator dependent (HCC)    Tracheostomy dependence (HCC)    Chronic respiratory failure requiring continuous mechanical ventilation through tracheostomy (HCC)    Abdominal distension    Constipation    Hypokalemia    Ileus (HCC) 04/24/2021   Pressure injury of skin 01/28/2020   Acute hypoxemic respiratory failure (HCC) 01/26/2020   Acute on chronic respiratory failure with hypoxia (HCC)    Septic shock (HCC)    Altered mental status, unspecified    Critical illness myopathy    Chronic pain syndrome    Internal jugular (IJ) vein thromboembolism, acute, unspecified laterality (HCC)    PCP:  Michail Sermon, MD Pharmacy:   [facility] [address] [place] [address] Phone: [phone] Fax: [phone]     Social Determinants of Health (SDOH) Interventions    Readmission Risk Interventions     No data to display

## 2022-06-19 NOTE — Consult Note (Addendum)
Neurology Consultation  Reason for Consult: episodes of unresponsiveness  Referring Physician: Dr. Criselda Peaches   CC: staring spells  History is obtained from:medical record and wife via phone  HPI: Andrew Rivera is a 71 y.o. male with past medical history of Chronic respiratory failure s/p trach, vent dependent, chronic pain, critical illness myopathy who presented from [facility] for starring spell with unresponsiveness on 8/24.   He was found to have E. Coli UTI and Klebsiella pneumonia and has been on abx for this  Spoke with wife and daughter on the phone. Per wife, she went to visit him on Wednesday and walked into his room and found him starring up at the ceiling and not responsive. EMS was called and brought him to the Ed. En route to the ED after receiving fluids he started to come around and be more responsive.  On this past Sunday he was again noted to have a staring spell with a rightward gaze and decreased responsiveness.  Spoke with the patients daughter via the phone and she states there was one time in 2020 when she found him unresponsive with a glazed look and called EMS. Other than this time neither wife or daughter can recall any other episodes. With all witnessed episodes there was no shaking or twitching of the muscles.   He has had a progressive myopathy for which she has been seen at [facility].  He had an episode of MSSA bacteremia in September 2020 following which he had prolonged ventilation.  He has been diagnosed with critical illness myopathy and had an EMG in December 2020 which was felt to be consistent with critical illness myopathy versus inflammatory myopathy.  Of note, there is mention of a positive Hoffmann's and 4+ reflexes at the time of that 2020 EMG.  Since that time he has had persistent weakness and numbness and has been hospitalized multiple times.  In July 2022 there is mention of referral to an outpatient neurologist for repeat EMG, but it does not appear this ever  occurred.   ROS:  Unable to obtain due to altered mental status.   Past Medical History:  Diagnosis Date   Acute on chronic respiratory failure with hypoxia (HCC)    Altered mental status, unspecified    Chronic pain syndrome    Critical illness myopathy    Internal jugular (IJ) vein thromboembolism, acute, unspecified laterality (HCC)    Septic shock (HCC)      History reviewed. No pertinent family history.   Social History:   reports that he quit smoking about 32 years ago. His smoking use included cigarettes. He smoked an average of 1 pack per day. He has never used smokeless tobacco. He reports that he does not currently use alcohol. He reports that he does not use drugs.  Medications  Current Facility-Administered Medications:    acetaminophen (TYLENOL) 160 MG/5ML solution 960 mg, 960 mg, Per Tube, Q8H, Rocky Morel, DO, 960 mg at 06/19/22 6144   bethanechol (URECHOLINE) tablet 10 mg, 10 mg, Per Tube, TID, Rocky Morel, DO, 10 mg at 06/19/22 0936   docusate (COLACE) 50 MG/5ML liquid 100 mg, 100 mg, Per Tube, BID, Rocky Morel, DO, 100 mg at 06/19/22 3154   escitalopram (LEXAPRO) tablet 10 mg, 10 mg, Per Tube, Daily, Rocky Morel, DO, 10 mg at 06/19/22 0086   feeding supplement (OSMOLITE 1.5 CAL) liquid 1,000 mL, 1,000 mL, Per Tube, Continuous, Gust Rung, DO, Last Rate: 55 mL/hr at 06/19/22 1000, Infusion Verify at 06/19/22 1000  feeding supplement (PROSource TF20) liquid 60 mL, 60 mL, Per Tube, Daily, Gust Rung, DO, 60 mL at 06/19/22 0938   fentaNYL (DURAGESIC) 25 MCG/HR 1 patch, 1 patch, Transdermal, Q72H, Rocky Morel, DO, 1 patch at 06/19/22 0953   ferrous sulfate 300 (60 Fe) MG/5ML syrup 300 mg, 300 mg, Per Tube, Early Chars, DO, 300 mg at 06/18/22 0913   fiber (NUTRISOURCE FIBER) 1 packet, 1 packet, Per Tube, BID, Rocky Morel, DO, 1 packet at 06/19/22 [phone]   free water 150 mL, 150 mL, Per Tube, Q4H, Hoffman, Erik C, DO, 150 mL at  06/19/22 0858   HYDROmorphone (DILAUDID) tablet 1 mg, 1 mg, Per Tube, Q8H PRN, Rocky Morel, DO, 1 mg at 06/17/22 1433   insulin aspart (novoLOG) injection 0-9 Units, 0-9 Units, Subcutaneous, Q4H, Jeannette Corpus T, MD, 1 Units at 06/19/22 0855   ipratropium-albuterol (DUONEB) 0.5-2.5 (3) MG/3ML nebulizer solution 3 mL, 3 mL, Nebulization, Q4H PRN, Rocky Morel, DO   lactulose (CHRONULAC) 10 GM/15ML solution 20 g, 20 g, Per Tube, BID PRN, Rocky Morel, DO   lip balm (CARMEX) ointment, , Topical, PRN, Gust Rung, DO, 75 Application at 06/17/22 2052   LORazepam (ATIVAN) tablet 0.5 mg, 0.5 mg, Per Tube, Q12H PRN, Rocky Morel, DO   melatonin tablet 10 mg, 10 mg, Per Tube, QHS PRN, Rocky Morel, DO, 10 mg at 06/17/22 2042   midodrine (PROAMATINE) tablet 5 mg, 5 mg, Per Tube, TID WC, Katsadouros, Vasilios, MD, 5 mg at 06/19/22 0900   mupirocin ointment (BACTROBAN) 2 %, , Nasal, BID, Inez Catalina, MD   Oral care mouth rinse, 15 mL, Mouth Rinse, Q2H, Hoffman, Erik C, DO, 15 mL at 06/19/22 0940   Oral care mouth rinse, 15 mL, Mouth Rinse, PRN, Carlynn Purl C, DO   pantoprazole (PROTONIX) 2 mg/mL oral suspension 40 mg, 40 mg, Per Tube, Daily, Carlynn Purl C, DO, 40 mg at 06/19/22 0938   piperacillin-tazobactam (ZOSYN) IVPB 3.375 g, 3.375 g, Intravenous, Q8H, Miguel Aschoff, MD, Stopped at 06/19/22 [phone]   polyethylene glycol (MIRALAX / GLYCOLAX) packet 17 g, 17 g, Per Tube, BID, Rocky Morel, DO, 17 g at 06/19/22 0175   polyvinyl alcohol (LIQUIFILM TEARS) 1.4 % ophthalmic solution 1 drop, 1 drop, Both Eyes, TID, Rocky Morel, DO, 1 drop at 06/19/22 0949   pregabalin (LYRICA) capsule 100 mg, 100 mg, Per Tube, TID, Rocky Morel, DO, 100 mg at 06/19/22 1025   rivaroxaban (XARELTO) tablet 10 mg, 10 mg, Per Tube, Daily, Rocky Morel, DO, 10 mg at 06/19/22 8527   scopolamine (TRANSDERM-SCOP) 1 MG/3DAYS 1.5 mg, 1 patch, Transdermal, Q72H, Rocky Morel, DO, 1.5 mg at  06/19/22 0954   sennosides (SENOKOT) 8.8 MG/5ML syrup 10 mL, 10 mL, Per Tube, BID, Rocky Morel, DO, 10 mL at 06/19/22 7824   zinc oxide 20 % ointment 1 Application, 1 Application, Topical, PRN, Rocky Morel, DO   Exam: Current vital signs: BP (!) 146/61   Pulse 99   Temp 99.4 F (37.4 C) (Axillary)   Resp 16   Ht 5\' 8"  (1.727 m)   Wt 82.7 kg   SpO2 99%   BMI 27.72 kg/m  Vital signs in last 24 hours: Temp:  [98.4 F (36.9 C)-100.7 F (38.2 C)] 99.4 F (37.4 C) (08/28 0753) Pulse Rate:  [91-109] 99 (08/28 1000) Resp:  [15-19] 16 (08/28 1000) BP: (112-180)/(39-67) 146/61 (08/28 1000) SpO2:  [96 %-99 %] 99 % (08/28 1000) FiO2 (%):  [40 %] 40 % (  08/28 0753) Weight:  [82.7 kg] 82.7 kg (08/28 0500)  GENERAL: Awake, alert in NAD HEENT: - Normocephalic and atraumatic, dry mm, trach on ventilator LUNGS - Clear to auscultation bilaterally with no wheezes CV - S1S2 RRR, no m/r/g, equal pulses bilaterally. ABDOMEN - Soft, nontender, nondistended with normoactive BS Ext: warm, well perfused, intact peripheral pulses, mild  edema  NEURO:  Mental Status: AA&Ox3. Mouths words Language: speech is unable to assess  Cranial Nerves: PERRL EOMI, visual fields full, no facial asymmetry, facial sensation intact, hearing intact, tongue/uvula/soft palate midline, normal sternocleidomastoid and trapezius muscle strength.  Motor: can slightly move fingers on right hand, no movement on left, can wiggle toes on bilateral feet. No proximal movements. Tone: is normal and bulk is normal Sensation- Intact to light touch bilaterally DTR: diffusely absent Coordination: unable to assesss Gait- deferred    Labs I have reviewed labs in epic and the results pertinent to this consultation are:  CBC    Component Value Date/Time   WBC 14.4 (H) 06/19/2022 0059   RBC 3.86 (L) 06/19/2022 0059   HGB 12.1 (L) 06/19/2022 0059   HCT 37.8 (L) 06/19/2022 0059   PLT 285 06/19/2022 0059   MCV 97.9  06/19/2022 0059   MCH 31.3 06/19/2022 0059   MCHC 32.0 06/19/2022 0059   RDW 14.3 06/19/2022 0059   LYMPHSABS 0.8 06/19/2022 0059   MONOABS 0.9 06/19/2022 0059   EOSABS 0.0 06/19/2022 0059   BASOSABS 0.1 06/19/2022 0059    CMP     Component Value Date/Time   NA 137 06/19/2022 0059   K 4.3 06/19/2022 0059   CL 99 06/19/2022 0059   CO2 32 06/19/2022 0059   GLUCOSE 156 (H) 06/19/2022 0059   BUN 19 06/19/2022 0059   CREATININE <0.30 (L) 06/19/2022 0059   CALCIUM 8.8 (L) 06/19/2022 0059   PROT 6.9 06/18/2022 1229   ALBUMIN 2.6 (L) 06/18/2022 1229   AST 24 06/18/2022 1229   ALT 31 06/18/2022 1229   ALKPHOS 112 06/18/2022 1229   BILITOT 0.4 06/18/2022 1229   GFRNONAA NOT CALCULATED 06/19/2022 0059   GFRAA >60 01/27/2020 0229    Lipid Panel  No results found for: "CHOL", "TRIG", "HDL", "CHOLHDL", "VLDL", "LDLCALC", "LDLDIRECT"   Imaging I have reviewed the images obtained:  CT-head 1. Normal CT appearance of the brain. 2. Left mastoid effusion without obstructing nasopharyngeal lesion.   MRI examination of the brain 1. No acute intracranial abnormality. 2. Mild findings of chronic small vessel ischemia and volume loss. 3. Left mastoid effusion  EEG 8/25: This study is suggestive of mild diffuse encephalopathy, nonspecific etiology. No seizures or epileptiform discharges were seen throughout the recording  EEG 8/27: This study is suggestive of moderate diffuse encephalopathy, nonspecific etiology. No seizures or epileptiform discharges were seen throughout the recording.    If suspicion for interictal activity remains a concern, a prolonged study can be considered.    Gevena Mart DNP, ACNPC-AG   I have seen the patient and reviewed the above note.  He has severe flaccid quadriparesis, proximal >distal though all muscle groups are affected.  He endorsed symmetric sensation, but due to tracheostomy I had significant difficulty with understanding when trying to do  more fine differentiation of whether he has any degree of sensation loss at all.   Assessment:  Andrew Rivera is a 71 y.o. male with past medical history of Chronic respiratory failure s/p trach, vent dependent, chronic pain, critical illness myopathy who presented from [facility] for  starring spell with unresponsiveness on 8/24.   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  He has tongue fasciculations which would be quite unusual for critical illness myopathy or polyneuropathy.  He is completely areflexic, which would argue against motor neuron disease, but it is possible that he has more than one process going on.  Especially given a history of upper motor neuron findings documented in the past.  The progressive nature and presence of tongue fasciculations would suggest a process such as this.   Further work-up should include repeat NCS/EMG as well as consideration of muscle biopsy.  We do not have neuropathologist in house and her turnaround times are therefore quite long, and I doubt he would be inpatient long enough to benefit from it.  Given how chronic this processes, I would favor a referral to a [facility] center with an ALS clinic as an outpatient if this can be arranged from his vent SNF.  If outpatient follow-up is in possibility, an inpatient transfer is not possible either, then I would consider pursuing a muscle biopsy as it will be helpful with future hospitalizations. I will also get a repeat MRI C-spine as this has not been done in quite some time.   As far as his staring spells, he has had a total of three episodes of unresponsiveness of unclear etiology.  If these are seizure then he likely has an underlying predisposition which is being treated with pregabalin.  This would be breakthrough in the setting of acute physiological stressor, and I would not favor changing his antiepileptic regimen at this time even if these did represent partial  seizures.  Recommendations: Continue pregabalin  MRI cervical spine.  [facility]/consideration of biopsy   Ritta Slot, MD [facility] [phone]  If 7pm- 7am, please page neurology on call as listed in AMION.

## 2022-06-20 ENCOUNTER — Other Ambulatory Visit: Payer: Self-pay | Admitting: Student

## 2022-06-20 DIAGNOSIS — G839 Paralytic syndrome, unspecified: Secondary | ICD-10-CM

## 2022-06-20 DIAGNOSIS — G8100 Flaccid hemiplegia affecting unspecified side: Secondary | ICD-10-CM | POA: Diagnosis not present

## 2022-06-20 DIAGNOSIS — R253 Fasciculation: Secondary | ICD-10-CM | POA: Diagnosis not present

## 2022-06-20 DIAGNOSIS — R531 Weakness: Secondary | ICD-10-CM | POA: Diagnosis not present

## 2022-06-20 DIAGNOSIS — R404 Transient alteration of awareness: Secondary | ICD-10-CM | POA: Diagnosis not present

## 2022-06-20 LAB — CULTURE, BLOOD (ROUTINE X 2)
Culture: NO GROWTH
Culture: NO GROWTH
Special Requests: ADEQUATE
Special Requests: ADEQUATE

## 2022-06-20 LAB — HEMOGLOBIN A1C
Hgb A1c MFr Bld: 5.3 % (ref 4.8–5.6)
Mean Plasma Glucose: 105.41 mg/dL

## 2022-06-20 LAB — BASIC METABOLIC PANEL
Anion gap: 6 (ref 5–15)
BUN: 20 mg/dL (ref 8–23)
CO2: 32 mmol/L (ref 22–32)
Calcium: 8.8 mg/dL — ABNORMAL LOW (ref 8.9–10.3)
Chloride: 101 mmol/L (ref 98–111)
Creatinine, Ser: <0.3 mg/dL — ABNORMAL LOW (ref 0.61–1.24)
Glucose, Bld: 134 mg/dL — ABNORMAL HIGH (ref 70–99)
Potassium: 4 mmol/L (ref 3.5–5.1)
Sodium: 139 mmol/L (ref 135–145)

## 2022-06-20 LAB — CBC
HCT: 38 % — ABNORMAL LOW (ref 39.0–52.0)
Hemoglobin: 12.7 g/dL — ABNORMAL LOW (ref 13.0–17.0)
MCH: 31.7 pg (ref 26.0–34.0)
MCHC: 33.4 g/dL (ref 30.0–36.0)
MCV: 94.8 fL (ref 80.0–100.0)
Platelets: 309 10*3/uL (ref 150–400)
RBC: 4.01 MIL/uL — ABNORMAL LOW (ref 4.22–5.81)
RDW: 14.3 % (ref 11.5–15.5)
WBC: 14.7 10*3/uL — ABNORMAL HIGH (ref 4.0–10.5)
nRBC: 0 % (ref 0.0–0.2)

## 2022-06-20 LAB — GLUCOSE, CAPILLARY
Glucose-Capillary: 116 mg/dL — ABNORMAL HIGH (ref 70–99)
Glucose-Capillary: 131 mg/dL — ABNORMAL HIGH (ref 70–99)
Glucose-Capillary: 106 mg/dL — ABNORMAL HIGH (ref 70–99)
Glucose-Capillary: 150 mg/dL — ABNORMAL HIGH (ref 70–99)
Glucose-Capillary: 122 mg/dL — ABNORMAL HIGH (ref 70–99)
Glucose-Capillary: 142 mg/dL — ABNORMAL HIGH (ref 70–99)

## 2022-06-20 MED ORDER — SODIUM CHLORIDE 0.9 % IV SOLN: INTRAVENOUS | Status: DC | PRN

## 2022-06-20 MED ORDER — LACTATED RINGERS IV BOLUS
500.0000 mL | Freq: Once | INTRAVENOUS | Status: AC
  Administered 2022-06-20: 500 mL via INTRAVENOUS

## 2022-06-20 MED ORDER — LACTATED RINGERS IV BOLUS
1000.0000 mL | Freq: Once | INTRAVENOUS | Status: AC
  Administered 2022-06-20: 1000 mL via INTRAVENOUS

## 2022-06-20 NOTE — Progress Notes (Signed)
Subjective:  Patient was hypotensive overnight. 500 mL LR bolus given at 4 AM with only minimal improvement.  1 L LR bolus given around 7 AM.  Patient still able to mouth answers. When asked if he had back pain he mouth yes.  Discussed that we would have the nurse give him dose of his PRN pain medication.  Discussed neurology recommendations and the plan to update his wife today.  Objective:  Vital signs in last 24 hours: Vitals:   06/20/22 0349 06/20/22 0400 06/20/22 0455 06/20/22 0500  BP:  (!) 91/38 (!) 102/43 (!) 104/42  Pulse:  92 89 89  Resp:  18 18 18   Temp: 98.2 F (36.8 C)     TempSrc: Oral     SpO2:  100% 100% 100%  Weight:      Height:       Physical Exam: Constitutional: chronically ill-appearing HENT: mucous membranes moist. Tongue fasciculations.  Eyes: normal conjunctivae. EOMI intact. PERRL. Blink reflex absent Neck: supple. Trach in place. Cardiovascular: regular rate and rhythm. No murmurs, rubs, or gallops.  Pulmonary: No respiratory distress. No wheezes, rales, or rhonchi.  Abdominal: soft, non-tender. Mildly tympanic. G-tube in place.  MSK: Flaccid upper extremities. Unable to lift bilateral upper and lower extremities. Able to wiggle toes bilaterally.  Neurological: alert & oriented x 3. Gaze deviation, but redirectable. Able to mouth answers.  Skin: warm and dry. Mottled skin on knees bilaterally.   Assessment/Plan:  Principal Problem:   Staring episodes Active Problems:   Chronic respiratory failure requiring continuous mechanical ventilation through tracheostomy (HCC)   Ventilator dependent (HCC)   Tracheostomy dependence (HCC)   Stage 1 skin ulcer of sacral region [facility])  Andrew Rivera is a 71 y/o male with past medical history of tracheostomy/ventilator, gastric tube dependency of unclear etiology, chronic back pain s/p surgical repair w/ post operative complications of infected hardware who lives at [facility], presented with staring spells and was  admitted for infectious vs seizure workup.    # Staring Spell # Tongue fasciculations # Subsyndromal delirium  Patient continues to have signs of a lower motor neuron dysfunction clinic flaccid extremities and tongue fasciculations. Likely due to polypharmacy of centrally-acting medications or underlying neurological disease. EEG negative for seizures. Blood cultures negative at 2 days.  Mildly hypotensive overnight. 1.5 L LR bolus given today.  Vital signs otherwise stable.  Patient continues to be responsive. 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 No residual spinal stenosis. Residual mild to moderate bilateral C5 through C7 foraminal narrowing as above. Neuro recommends outpatient follow-up with EMG and muscle biopsy. Dr. 66 spoke with the patient's wife who would prefer that the patient return to [facility] so that he can then go home with family and have outpatient follow-up at [facility].  Currently in the process of faxing referral to [facility] neurological disorders clinic (Fax: [phone]).  - Continue holding Hydroxyzine and Loratadine  - Treat infection as stated below    2. # Flaccid upper extremities # Profound extremity weakness Nerve conduction study in 2020 [facility]) for left-sided weakness and upper extremity weakness.  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 Negative Autoimmune myositis panel in 2020.  Patient was supposed to get a muscle biopsy outpatient which never got completed. 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 Neurology recommendations as stated above. 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 Likely secondary to chronic illness.   4. # Complicated UTI -  E. Coli # Pneumonia  # Leukocytosis # Pyuria, Bacteruria Urine culture demonstrates > 100K colonies of E. Coli. Completed 3-day course of Vanc and cefepime on 8/27. Zosyn started on 8/26.  Respiratory culture demonstrates few Klebsiella pneumonia.  WBC count increased to 14.7 today. - Zosyn day 4/7 - Monitor blood cultures   5. # Tracheostomy dependent Unsuccessful weaning attempt on 8/27. Cannot perform EMG due to inability to wean. Likely secondary to weakened diaphragmatic muscles. PCCM following. - Continue weaning trials   6. # Pressure Ulcers # Stage 1 ulcer of the sacral region Wound care is following.    7. # Tympanic Abdomen  Patient's abdomen is mildly tympanic.  Bowel movement of 200 mL today.  Paged by nursing staff for diarrhea.  Flexi-Seal ordered. - Continue to monitor.    Diet: Tube Feeds IVF: None  VTE: DOAC Code: Full   Prior to Admission Living Arrangement: [facility] Anticipated Discharge Location: [facility] Barriers to Discharge: continued management Dispo: Anticipated discharge in approximately less than 2 day(s).   Karoline Caldwell, MD 06/20/2022, 6:07 AM Pager: [phone] After 5pm on weekdays and 1pm on weekends: On Call pager [phone]

## 2022-06-20 NOTE — Progress Notes (Deleted)
NAME:  Andrew Rivera, MRN:  [id number], DOB:  1951-09-14, LOS: 5 ADMISSION DATE:  06/15/2022, CONSULTATION DATE: 06/15/2022 REFERRING MD: Dr. 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  Resides at [facility].  He was brought to the ED 8/24 with flushing, diaphoresis and altered mental status.  He apparently experienced a staring spell that was witnessed.  Initially poorly responsive following the spell and then slowly return to normal mental status.  Evaluation in the ED noted leukocytosis, pyuria.  He had some increased green secretions with question bronchitis versus pneumonia.  He has been placed on antibiotics and evaluation, cultures ongoing.  Neurology has recommended EEG, MRI brain.  PCCM consulted regarding his tracheostomy and ventilator dependence.  Pertinent  Medical History   Past Medical History:  Diagnosis Date   Acute on chronic respiratory failure with hypoxia (HCC)    Altered mental status, unspecified    Chronic pain syndrome    Critical illness myopathy    Internal jugular (IJ) vein thromboembolism, acute, unspecified laterality (HCC)    Septic shock (HCC)    Significant Hospital Events: Including procedures, antibiotic start and stop dates in addition to other pertinent events   8/24 admitted 8/27 negative spot EEG 8/28 MRI C-spine   Interim History / Subjective:  Increased awareness overnight. MAP's in 50's-60's on home midodrine 5 mg, responded to 500 cc's bolus.  No acute concerns this morning, feels the same. Able to mouth words and more communicative this morning.   Objective   Blood pressure (!) 106/44, pulse 93, temperature 98.2 F (36.8 C), temperature source Oral, resp. rate 18, height 5\' 8"  (1.727 m), weight 82.7 kg, SpO2 100 %.    Vent Mode:  PRVC FiO2 (%):  [40 %] 40 % Set Rate:  [18 bmp] 18 bmp Vt Set:  [540 mL] 540 mL PEEP:  [5 cmH20] 5 cmH20 Plateau Pressure:  [15 cmH20-28 cmH20] 17 cmH20   Intake/Output Summary (Last 24 hours) at 06/20/2022 0631 Last data filed at 06/20/2022 0600 Gross per 24 hour  Intake 1676.9 ml  Output 950 ml  Net 726.9 ml    Filed Weights   06/17/22 0500 06/18/22 0600 06/19/22 0500  Weight: 80.5 kg 82.7 kg 82.7 kg   Examination: Ill-appearing man lying in bed no acute distress [place]/AT, eyes anicteric. PERRL. Tongue fasciculations present.  Trach in place-Shiley #6 distal XLT Diffuse rhonchi, breathing comfortably on the vent.  Thin secretions S1-S2, regular rate and rhythm Abdomen soft, nontender.  G-tube in place.  No significant lower extremity edema. Aox3. Able to mouth words. Flaccid extremities. Tongue fasciculations.  No diffuse rashes.  Minimal mottling anterior knees.   EEG> moderate diffuse encephalopathy, no seizures or epileptiform discharges Blood cultures> NGTD Resp culture> K. Pneumoniae Cervical MRI w/o C - + diffuse spinal cord atrophy w/ chronic myelomalacia C4/C5  Assessment & Plan:  Chronic respiratory failure with ventilator dependence apparently due to neuromuscular weakness, respiratory muscle weakness.  Unclear etiology, likely multifactorial from years of ventilation support and suspected degenerative neurological disease process w/ cervical spinal cord atrophy. Negative sniff testing in the past. Will need further outpatient neurological testing and attempted vent weaning Hypotension - MAPS into 50-60 overnight, responded well to IV fluids. Remained afebrile, LR bolus ordered by primary. WBC pending. BC negative to date. Abx as per below.  Klebsiella  pneumoniae pneumonia- zosyn day 3/7. Pan sensitive E Coli UTI with urinary retention- zosyn day 3/7, resistant to cephalosporins  Staring spells/tongue fasciculations- per primary/neurology. MRI CT with diffuse spinal  cord atrophy. With CT imaging and overall course suspect underlying degenerative neurological process, less likely due to infectious etiologies.   Thalia Bloodgood, DO 06/20/22 6:31 AM PGY-3 IM Resident

## 2022-06-20 NOTE — TOC Progression Note (Signed)
Transition of Care [facility]) - Initial/Assessment Note    Patient Details  Name: Andrew Rivera MRN: [id number] Date of Birth: 06-Feb-1951  Transition of Care [facility]) CM/SW Contact:    Ralene Bathe, LCSWA Phone Number: 06/20/2022, 3:25 PM  Clinical Narrative:                 CSW informed that patient should be medically ready to d/c to facility tomorrow.  Admissions with [facility] notified.    TOC will continue to follow.         Patient Goals and CMS Choice        Expected Discharge Plan and Services                                                Prior Living Arrangements/Services                       Activities of Daily Living Home Assistive Devices/Equipment: Vent/Trach supplies, Enteral Feeding Supplies, Hospital bed ADL Screening (condition at time of admission) Patient's cognitive ability adequate to safely complete daily activities?: Yes Is the patient deaf or have difficulty hearing?: No Does the patient have difficulty seeing, even when wearing glasses/contacts?: No Does the patient have difficulty concentrating, remembering, or making decisions?: No Patient able to express need for assistance with ADLs?: Yes Does the patient have difficulty dressing or bathing?: Yes Independently performs ADLs?: No Communication: Independent Dressing (OT): Dependent Is this a change from baseline?: Pre-admission baseline Grooming: Dependent Is this a change from baseline?: Pre-admission baseline Feeding: Dependent Is this a change from baseline?: Pre-admission baseline Toileting: Dependent Is this a change from baseline?: Pre-admission baseline In/Out Bed: Dependent Is this a change from baseline?: Pre-admission baseline Walks in Home: Dependent Is this a change from baseline?: Pre-admission baseline Does the patient have difficulty walking or climbing stairs?: Yes Weakness of Legs: Both Weakness of Arms/Hands: Both  Permission Sought/Granted                   Emotional Assessment              Admission diagnosis:  Altered mental status, unspecified altered mental status type [R41.82] Staring episodes [R40.4] Patient Active Problem List   Diagnosis Date Noted   Stage 1 skin ulcer of sacral region (HCC) 06/16/2022   Staring episodes 06/15/2022   Malnutrition of moderate degree 06/30/2021   Left inguinal hernia 06/29/2021   Dysautonomia (HCC)    Ventilator dependent (HCC)    Tracheostomy dependence (HCC)    Chronic respiratory failure requiring continuous mechanical ventilation through tracheostomy (HCC)    Abdominal distension    Constipation    Hypokalemia    Ileus (HCC) 04/24/2021   Pressure injury of skin 01/28/2020   Acute hypoxemic respiratory failure (HCC) 01/26/2020   Acute on chronic respiratory failure with hypoxia (HCC)    Septic shock (HCC)    Altered mental status, unspecified    Critical illness myopathy    Chronic pain syndrome    Internal jugular (IJ) vein thromboembolism, acute, unspecified laterality (HCC)    PCP:  Michail Sermon, MD Pharmacy:   [facility] [address] [place] [address] Phone: [phone] Fax: [phone]     Social Determinants of Health (SDOH) Interventions    Readmission Risk Interventions  No data to display

## 2022-06-20 NOTE — Progress Notes (Signed)
Patient transported to MRI, with RN.

## 2022-06-20 NOTE — Progress Notes (Signed)
eLink Physician-Brief Progress Note Patient Name: Andrew Rivera DOB: 08/28/1951 MRN: [id number]   Date of Service  06/20/2022  HPI/Events of Note  Notified of hypotension with BP 101/43, MAP 59.  Crea <0.30.  Pt is on antibiotics for pneumonia and UTI. He is vent dependent due to neuromuscular weakness.   eICU Interventions  Give 500cc LR bolus.      Intervention Category Intermediate Interventions: Hypotension - evaluation and management  Larinda Buttery 06/20/2022, 3:49 AM

## 2022-06-20 NOTE — Progress Notes (Signed)
Patient transported from MRI to 3M09 without incidence.

## 2022-06-21 ENCOUNTER — Other Ambulatory Visit (HOSPITAL_COMMUNITY): Payer: Self-pay

## 2022-06-21 DIAGNOSIS — R531 Weakness: Secondary | ICD-10-CM | POA: Diagnosis not present

## 2022-06-21 DIAGNOSIS — G8100 Flaccid hemiplegia affecting unspecified side: Secondary | ICD-10-CM | POA: Diagnosis not present

## 2022-06-21 DIAGNOSIS — R253 Fasciculation: Secondary | ICD-10-CM | POA: Diagnosis not present

## 2022-06-21 DIAGNOSIS — R404 Transient alteration of awareness: Secondary | ICD-10-CM | POA: Diagnosis not present

## 2022-06-21 LAB — CBC
HCT: 38.8 % — ABNORMAL LOW (ref 39.0–52.0)
Hemoglobin: 13.1 g/dL (ref 13.0–17.0)
MCH: 32 pg (ref 26.0–34.0)
MCHC: 33.8 g/dL (ref 30.0–36.0)
MCV: 94.9 fL (ref 80.0–100.0)
Platelets: 364 10*3/uL (ref 150–400)
RBC: 4.09 MIL/uL — ABNORMAL LOW (ref 4.22–5.81)
RDW: 14.6 % (ref 11.5–15.5)
WBC: 16.6 10*3/uL — ABNORMAL HIGH (ref 4.0–10.5)
nRBC: 0 % (ref 0.0–0.2)

## 2022-06-21 LAB — GLUCOSE, CAPILLARY
Glucose-Capillary: 148 mg/dL — ABNORMAL HIGH (ref 70–99)
Glucose-Capillary: 127 mg/dL — ABNORMAL HIGH (ref 70–99)
Glucose-Capillary: 148 mg/dL — ABNORMAL HIGH (ref 70–99)

## 2022-06-21 MED ORDER — OSMOLITE 1.5 CAL PO LIQD: 1000.0000 mL | ORAL | 0 refills | Status: DC

## 2022-06-21 MED ORDER — LORAZEPAM 0.5 MG PO TABS: 0.5000 mg | ORAL_TABLET | Freq: Two times a day (BID) | ORAL | 0 refills | Status: DC | PRN

## 2022-06-21 MED ORDER — PREGABALIN 100 MG PO CAPS
100.0000 mg | ORAL_CAPSULE | Freq: Three times a day (TID) | ORAL | 0 refills | Status: DC
Start: 2022-06-21 — End: 2022-06-21

## 2022-06-21 MED ORDER — HYDROMORPHONE HCL 2 MG PO TABS: 1.0000 mg | ORAL_TABLET | Freq: Three times a day (TID) | ORAL | 0 refills | Status: DC | PRN

## 2022-06-21 MED ORDER — HYDROMORPHONE HCL 2 MG PO TABS
1.0000 mg | ORAL_TABLET | Freq: Three times a day (TID) | ORAL | 0 refills | Status: DC | PRN
Start: 2022-06-21 — End: 2022-06-21

## 2022-06-21 MED ORDER — PROSOURCE TF20 ENFIT COMPATIBL EN LIQD: 60.0000 mL | Freq: Every day | ENTERAL | Status: AC

## 2022-06-21 MED ORDER — FREE WATER
150.0000 mL | Status: DC
Start: 2022-06-21 — End: 2024-01-08

## 2022-06-21 MED ORDER — MEROPENEM 1 G IV SOLR: 1.0000 g | Freq: Three times a day (TID) | INTRAVENOUS | 0 refills | Status: AC

## 2022-06-21 MED ORDER — LORAZEPAM 0.5 MG PO TABS
0.5000 mg | ORAL_TABLET | Freq: Two times a day (BID) | ORAL | 0 refills | Status: DC | PRN
Start: 2022-06-21 — End: 2022-06-21

## 2022-06-21 MED ORDER — PREGABALIN 100 MG PO CAPS: 100.0000 mg | ORAL_CAPSULE | Freq: Three times a day (TID) | ORAL | 0 refills | Status: DC

## 2022-06-21 MED ORDER — ACETAMINOPHEN 500 MG PO TABS
1000.0000 mg | ORAL_TABLET | Freq: Three times a day (TID) | ORAL | 0 refills | Status: AC
  Filled 2022-06-21: qty 90, 15d supply, fill #0

## 2022-06-21 NOTE — Discharge Summary (Signed)
Name: Andrew Rivera MRN: [id number] DOB: 1951-07-31 71 y.o. PCP: Andrew Monks, MD  Date of Admission: 06/15/2022 12:40 PM Date of Discharge: 06/21/22 Attending Physician: Andrew Falcon, MD  Discharge Diagnosis: 1. Principal Problem:   Staring episodes Active Problems:   Chronic respiratory failure requiring continuous mechanical ventilation through tracheostomy ([place])   Ventilator dependent ([place])   Tracheostomy dependence ([place])   Stage 1 skin ulcer of sacral region [facility])   Discharge Medications: Allergies as of 06/21/2022       Reactions   Chlorhexidine Itching, Rash   Codeine Nausea Only   Oxycodone-acetaminophen Nausea Only   Other reaction(s): Hallucinations        Medication List     STOP taking these medications    docusate sodium 100 MG capsule Commonly known as: COLACE   furosemide 10 MG/ML solution Commonly known as: LASIX   guaiFENesin 100 MG/5ML liquid Commonly known as: ROBITUSSIN   sennosides 8.8 MG/5ML syrup Commonly known as: SENOKOT       TAKE these medications    acetaminophen 500 MG tablet Commonly known as: TYLENOL Place 2 tablets (1,000 mg total) into feeding tube every 8 (eight) hours. What changed:  medication strength how much to take how to take this when to take this Another medication with the same name was removed. Continue taking this medication, and follow the directions you see here.   bethanechol 10 MG tablet Commonly known as: URECHOLINE Place 10 mg into feeding tube 3 (three) times daily.   Biotene OralBalance Dry Mouth Gel Use as directed 1 Application in the mouth or throat as needed (dry mouth and throat).   Calmoseptine 0.44-20.6 % Oint Generic drug: Menthol-Zinc Oxide Apply 1 application topically See admin instructions. Apply thin layer of calmoseptine to sacral area, between brief changes for redness   Carboxymethylcellulose Sodium 1 % Gel Place 1 drop into both eyes in the morning, at noon, and at  bedtime.   escitalopram 10 MG tablet Commonly known as: LEXAPRO Place 10 mg into feeding tube daily.   feeding supplement (OSMOLITE 1.5 CAL) Liqd Place 1,000 mLs into feeding tube continuous. What changed:  how much to take Another medication with the same name was removed. Continue taking this medication, and follow the directions you see here.   feeding supplement (PROSource TF20) liquid Place 60 mLs into feeding tube daily.   fentaNYL 25 MCG/HR Commonly known as: [place] 1 patch onto the skin every 3 (three) days.   ferrous sulfate 325 (65 FE) MG tablet Take 325 mg by mouth See admin instructions. Take one tablet by mouth every 2 days per g-tube   fiber Pack packet Place 1 packet into feeding tube 2 (two) times daily.   free water Soln Place 150 mLs into feeding tube every 4 (four) hours.   HYDROmorphone 2 MG tablet Commonly known as: DILAUDID Place 0.5 tablets (1 mg total) into feeding tube every 8 (eight) hours as needed for severe pain.   hydrOXYzine 25 MG tablet Commonly known as: ATARAX Place 25 mg into feeding tube 3 (three) times daily.   ipratropium-albuterol 0.5-2.5 (3) MG/3ML Soln Commonly known as: DUONEB Take 3 mLs by nebulization every 4 (four) hours as needed (respiratory failure).   lactulose (encephalopathy) 10 GM/15ML Soln Commonly known as: CHRONULAC Place 30 mLs (20 g total) into feeding tube 2 (two) times daily as needed for mild constipation.   lansoprazole 30 MG capsule Commonly known as: PREVACID Place 30 mg into feeding tube  daily at 12 noon.   levocetirizine 5 MG tablet Commonly known as: XYZAL Place 5 mg into feeding tube every evening.   LORazepam 0.5 MG tablet Commonly known as: ATIVAN Place 1 tablet (0.5 mg total) into feeding tube every 8 (eight) hours as needed for anxiety. What changed: when to take this   Melatonin 10 MG Tabs Give 10 mg by tube at bedtime.   meropenem 1 g injection Commonly known as:  MERREM Inject 1 g into the vein every 8 (eight) hours for 5 days.   midodrine 10 MG tablet Commonly known as: PROAMATINE Place 1 tablet (10 mg total) into feeding tube 3 (three) times daily.   mouth rinse Liqd solution 15 mLs by Mouth Rinse route in the morning and at bedtime.   mupirocin ointment 2 % Commonly known as: BACTROBAN Place 1 application into the nose 2 (two) times daily.   ondansetron 4 MG disintegrating tablet Commonly known as: ZOFRAN-ODT Take 4 mg by mouth every 8 (eight) hours as needed for nausea or vomiting.   polyethylene glycol 17 g packet Commonly known as: MIRALAX / GLYCOLAX Place 17 g into feeding tube 2 (two) times daily.   pregabalin 100 MG capsule Commonly known as: LYRICA Place 1 capsule (100 mg total) into feeding tube 3 (three) times daily.   Triple Antibiotic Plus 1 % Oint Generic drug: Neomy-Bacit-Polymyx-Pramoxine Apply 1 application topically See admin instructions. Apply thin layer to right great toe until healed leave open to air.               Discharge Care Instructions  (From admission, onward)           Start     Ordered   06/21/22 0000  Discharge wound care:       Comments: ound care to sacral Stage 1 pressure injury (POA):  cleanse with NS, pat dry. Cover with silicone foam for sacrum. Life each shift to assess area. Turn and reposition per house protocol from side to side and minimize time in the supine position.   06/21/22 1149            Disposition and follow-up:   Andrew Rivera was discharged from Andrew Rivera in [place] condition.  At the Rivera follow up visit please address:  1.  Staring Spells/Extremity weakness  2.  Labs / imaging needed at time of follow-up: EMG, Muscle Biopsy, Nerve Conduction study  3.  Pending labs/ test needing follow-up: None  Follow-up Appointments: - Primary care provider Rivera, Andrew Costain, MD within the next 1-2 weeks  - Referral made to the [facility]  [facility] Course by problem list: Andrew Rivera is a 71 y/o male with past medical history of tracheostomy/ventilator, gastric tube dependency of unclear etiology, chronic back pain s/p surgical repair w/ post operative complications of infected hardware who lives at [place], presented with staring spells and was admitted for infectious vs seizure workup.    # Staring Spell # Tongue fasciculations # Subsyndromal delirium  Patient presented with signs of a lower motor neuron dysfunction clinic flaccid extremities and tongue fasciculations. Likely due to polypharmacy of centrally-acting medications or underlying neurological disease. Held Hydroxyzine and Loratadine. EEG negative for seizures. Blood cultures negative. Patient continued to be responsive to questioning. Neuro concerned for a progressive myopathy. Neuro recommends outpatient follow-up at [facility] with EMG, muscle biopsy, and nerve conduction study. Dr. Peyton Najjar spoke with the patient's wife who would prefer that the patient return  to [facility] so that he can then go home with family and have outpatient follow-up at [place] [facility]. Referral made to [facility] neurological disorders clinic (Fax: [phone]). Patient states that he would like to go back to [facility] but is unsure if he would like a further work-up for his condition   2. # Flaccid upper extremities # Profound extremity weakness Nerve conduction study in 2020 [facility]) for left-sided weakness and upper extremity weakness.  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 Negative Autoimmune myositis panel in 2020.  Patient was supposed to get a muscle biopsy outpatient which never got completed. 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 Administered acetaminophen, fentanyl patch, and PRN dilaudid for pain.   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   4. # Complicated UTI - E. Coli # Pneumonia  # Leukocytosis # Pyuria, Bacteruria Urine culture demonstrated > 100K colonies of E. Coli. Completed 3-day course of Vanc and cefepime on 8/27. Zosyn started on 8/26. Received 5 days of Zosyn. Respiratory culture demonstrates few Klebsiella pneumonia.  Blood cultures negative. WBC count 16.6 on day of discharge. Persistently mildly hypotensive with wide pulse pressure. MAP remained in 70s.  Vital signs otherwise stable. Switched to Meropenem per pharmacy on 8/30. Will continue Meropenem following discharge until 06/26/22.    5. # Tracheostomy dependent Unsuccessful weaning attempt on 8/27. Could not perform EMG due to inability to wean from trach. Likely secondary to weakened diaphragmatic muscles. PCCM followed.   6. # Pressure Ulcers # Stage 1 ulcer of the sacral region Wound care followed.    7. # Diarrhea Patient's abdomen was mildly tympanic consistently. Patient had bowel movements regularly. Notified by nursing about diarrhea on 8/29. Flexi-seal ordered. Held docusate and senna upon discharge.    Discharge Exam:   BP (!) 109/41   Pulse 88   Temp 98.4 F (36.9 C) (Oral)   Resp 18   Ht 5\' 8"  (1.727 m)   Wt 82.7 kg   SpO2 99%   BMI 27.72 kg/m   Constitutional: chronically ill-appearing HENT: mucous membranes moist. Tongue fasciculations.  Eyes: normal conjunctivae. EOMI intact. PERRL. Blink reflex absent Neck: supple. Trach in place. Cardiovascular: regular rate and rhythm. No murmurs, rubs, or gallops.  Pulmonary: No respiratory distress. No wheezes, rales, or rhonchi.  Abdominal: soft, non-tender. Mildly tympanic. G-tube in place.  MSK: Flaccid upper extremities. Unable to lift bilateral upper and lower extremities. Able to wiggle toes bilaterally.  Neurological: alert & oriented x 3. Gaze deviation, but redirectable. Able to mouth answers.  Skin: warm and dry. Mottled skin on knees bilaterally.   Pertinent  Labs, Studies, and Procedures:     Latest Ref Rng & Units 06/21/2022    8:28 AM 06/20/2022    6:48 AM 06/19/2022   12:59 AM  CBC  WBC 4.0 - 10.5 K/uL 16.6  14.7  14.4   Hemoglobin 13.0 - 17.0 g/dL 06/21/2022  31.5  17.6   Hematocrit 39.0 - 52.0 % 38.8  38.0  37.8   Platelets 150 - 400 K/uL 364  309  285        Latest Ref Rng & Units 06/20/2022    6:48 AM 06/19/2022   12:59 AM 06/18/2022   12:29 PM  BMP  Glucose 70 - 99 mg/dL 06/20/2022  737  106   BUN 8 - 23 mg/dL 20  19  17    Creatinine  0.61 - 1.24 mg/dL <1.88  <4.16  <6.06   Sodium 135 - 145 mmol/L 139  137  137   Potassium 3.5 - 5.1 mmol/L 4.0  4.3  4.6   Chloride 98 - 111 mmol/L 101  99  99   CO2 22 - 32 mmol/L 32  32  29   Calcium 8.9 - 10.3 mg/dL 8.8  8.8  9.2     EEG adult  Result Date: 06/16/2022 Charlsie Quest, MD     06/16/2022 12:59 PM Patient Name: Andrew Rivera MRN: [id number] Epilepsy Attending: Charlsie Quest Referring Physician/Provider: Lyndle Herrlich, MD Date: 06/16/2022 Duration: 23.51 mins Patient history: 71yo M with seizure like activity. EEG to evaluate for seizure. Level of alertness: Awake AEDs during EEG study: None Technical aspects: This EEG study was done with scalp electrodes positioned according to the 10-20 International system of electrode placement. Electrical activity was reviewed with band pass filter of 1-70Hz , sensitivity of 7 uV/mm, display speed of 50mm/sec with a 60Hz  notched filter applied as appropriate. EEG data were recorded continuously and digitally stored.  Video monitoring was available and reviewed as appropriate. Description: The posterior dominant rhythm consists of 8 Hz activity of moderate voltage (25-35 uV) seen predominantly in posterior head regions, symmetric and reactive to eye opening and eye closing. EEG showed intermittent generalized 3 to 6 Hz theta-delta slowing. Hyperventilation and photic stimulation were not performed.   ABNORMALITY - Intermittent slow, generalized IMPRESSION:  This study is suggestive of mild diffuse encephalopathy, nonspecific etiology. No seizures or epileptiform discharges were seen throughout the recording.   MR BRAIN WO CONTRAST  Result Date: 06/16/2022 CLINICAL DATA:  Mental status change EXAM: MRI HEAD WITHOUT CONTRAST TECHNIQUE: Multiplanar, multiecho pulse sequences of the brain and surrounding structures were obtained without intravenous contrast. COMPARISON:  None Available. FINDINGS: Brain: No acute infarct, mass effect or extra-axial collection. No acute or chronic hemorrhage. There is multifocal hyperintense T2-weighted signal within the white matter. Generalized volume loss. The midline structures are normal. Vascular: Major flow voids are preserved. Skull and upper cervical spine: Normal calvarium and skull base. Visualized upper cervical spine and soft tissues are normal. Sinuses/Orbits:Left mastoid effusion normal orbits. IMPRESSION: 1. No acute intracranial abnormality. 2. Mild findings of chronic small vessel ischemia and volume loss. 3. Left mastoid effusion. Electronically Signed   By: 06/18/2022 M.D.   On: 06/16/2022 01:30   CT Head Wo Contrast  Result Date: 06/15/2022 CLINICAL DATA:  Altered mental status. EXAM: CT HEAD WITHOUT CONTRAST TECHNIQUE: Contiguous axial images were obtained from the base of the skull through the vertex without intravenous contrast. RADIATION DOSE REDUCTION: This exam was performed according to the departmental dose-optimization program which includes automated exposure control, adjustment of the mA and/or kV according to patient size and/or use of iterative reconstruction technique. COMPARISON:  None Available. FINDINGS: Brain: No acute infarct, hemorrhage, or mass lesion is present. No significant white matter lesions are present. The ventricles are of normal size. No significant extraaxial fluid collection is present. The brainstem and cerebellum are within normal limits. Vascular:  Atherosclerotic calcifications are present within the cavernous internal carotid arteries bilaterally. No hyperdense vessel is present. Skull: Calvarium is intact. No focal lytic or blastic lesions are present. No significant extracranial soft tissue lesion is present. Sinuses/Orbits: A left mastoid effusion is present. No obstructing nasopharyngeal lesion is present. No middle ear fusion is present. The paranasal sinuses and mastoid air cells are otherwise clear. Bilateral lens  replacements are noted. Globes and orbits are otherwise unremarkable. IMPRESSION: 1. Normal CT appearance of the brain. 2. Left mastoid effusion without obstructing nasopharyngeal lesion. Electronically Signed   By: [place] M.D.   On: 06/15/2022 16:25   DG Chest Portable 1 View  Result Date: 06/15/2022 CLINICAL DATA:  Tracheal secretions. EXAM: PORTABLE CHEST 1 VIEW COMPARISON:  April 29, 2021. FINDINGS: Stable cardiomediastinal silhouette. Tracheostomy tube is in grossly Andrew position. Bibasilar atelectasis or infiltrates are noted with small bilateral pleural effusions. Bony thorax is unremarkable. IMPRESSION: Bibasilar atelectasis or infiltrates are noted with small bilateral pleural effusions. Electronically Signed   By: Marijo Conception M.D.   On: 06/15/2022 14:02     Discharge Instructions:  You were hospitalized for staring spells.   [facility] course: - We looked at your brain waves and did not find any seizure activity.  We held medications that could potentially cause your staring spells.  Spoke with neurology who recommended outpatient neurology follow-up at [facility].  We treated your urinary tract infection and pneumonia with antibiotics.   Medications: Please start taking: -Protein (feeding supplement Prosource) liquid (60 mL in the feeding tube daily) for nutrition -Water for irrigation (free water) (150 mL into feeding tube every 4 hours) for irrigation of your feeding tube   Please change how you  take: -Acetaminophen (Tylenol) 500 mg tablet (place 2 tablets in the feeding tube every 8 hours) for pain -Nutritional supplements (feeding supplement Osmolite) (placed 1000 mL into feeding tube daily) for nutrition   Please stop taking: -Docusate sodium (Colace) 100 mg capsule (1 capsule by mouth 2 times daily) for constipation: Developed diarrhea in the Rivera -Furosemide (Lasix) 10 mg solution (40 mg IV daily): Only for one-time dose prior to your hospitalization -Guaifenesin (Robitussin) 100 mg/5ML liquid (5 mL by mouth every 6 hours) for cough   Please continue taking: -Bethanechol (Urecholine) 10 mg tablet (10 mg) 2-3 times daily) for dry mouth -Deficient saliva (Biotene oral balance dry mouth) gel (apply once to mouth or throat as needed) for dry mouth -Menthol-zinc oxide (Calmoseptine) ointment (apply once to sacral area) for pressure ulcer -Carboxymethylcellulose sodium 1% gel (Place 1 drop in eyes in the morning, at noon, and at bedtime) for dry eyes -Escitalopram (Lexapro) 10 mg tablet (10 mg in the feeding tube daily) for anxiety -Fentanyl (Duragesic) 25 mcg/HR (1 patch onto the skin every 3 days) for pain -Ferrous sulfate 325 mg tablet (1 tablet per G-tube every 2 days) for anemia -Fiber (Neutrisource fiber) packet (1 packet feeding tube twice daily) for constipation -Hydromorphone (Dilaudid) 2 mg tablet (placed 0.5 tablet into feeding tube every 8 hours as needed) for pain -Hydroxyzine (Atarax/Vistaril) 25 mg tablet (placed 25 mg into feeding tube 3 times daily) for anxiety -Ipratropium-albuterol (DuoNeb) solution (3 mL by nebulization every 4 hours as needed) for shortness of breath -Lactulose (Chronulac) 10 GM/15 mL solution (30 mL into feeding tube twice daily as needed) for constipation -Lansoprazole (Prevacid) 30 mg tablet (30 mg into feeding tube daily at noon) for acid reflux -Levocetirizine (Xyzal) 5 mg tablet (5 mg at the feeding tube every evening) for  sleep -Lorazepam (Ativan) 0.5 mg tablet (placed 1 tablet 3 to every 8 hours as needed) for anxiety -Melatonin 10 mg tablets (give 10 mg x 2 by bedtime) for sleep -Meropenem (Merrem) 1 g injection (inject 1 g into the vein every 8 hours for 5 days; finish on 06/26/22) -Midodrine (primary team) 10 mg tablet (1 tablet feeding tube 3 times  daily) for blood pressure -Mouthwashes (mouth rinse) liquid solution (15 mL mouth rinse in the morning and at bedtime) for dry mouth -Mupirocin ointment (Bactroban) 2% (applied to nose 2 times daily) for dry skin -Ondansetron (Zofran) 4 mg tablet (4 mg by mouth every 8 hours as needed) for nausea and vomiting -Ethylene glycol (MiraLAX) 17 g packet (placed 17 g in the feeding tube twice daily) for constipation -Pregabalin (Lyrica) 100 mg tablet (placed 1 capsule into feeding tube 3 times daily) for nerve pain -Neomy-Bacit-Polymyx-Pramoxine (triple antibiotic plus) 1% ointment (apply 1 application to great toe and leave open to air) for toe wound   Follow-up: - Please follow-up with your primary care provider Rivera, Andrew Costain, MD within the next 1-2 weeks.  - Please follow-up at the [facility] disorders clinic for further evaluation of your neurological symptoms.   Signed: Starlyn Skeans, MD 06/21/2022 11:30 AM Pager: [phone]

## 2022-06-21 NOTE — Progress Notes (Signed)
Patient DC to [facility].  Report called and given to [place], Charity fundraiser.  Patient transported by [facility].  Discharge paperwork and prescriptions given to [facility] RN

## 2022-06-21 NOTE — Discharge Instructions (Addendum)
You were hospitalized for staring spells.  Hospital course: - We looked at your brain waves and did not find any seizure activity.  We held medications that could potentially cause your staring spells.  Spoke with neurology who recommended outpatient neurology follow-up at [facility].  We treated your urinary tract infection and pneumonia with antibiotics.  Medications: Please start taking: -Protein (feeding supplement Prosource) liquid (60 mL in the feeding tube daily) for nutrition -Water for irrigation (free water) (150 mL into feeding tube every 4 hours) for irrigation of your feeding tube  Please change how you take: -Acetaminophen (Tylenol) 500 mg tablet (place 2 tablets in the feeding tube every 8 hours) for pain -Nutritional supplements (feeding supplement Osmolite) (placed 1000 mL into feeding tube daily) for nutrition  Please stop taking: -Docusate sodium (Colace) 100 mg capsule (1 capsule by mouth 2 times daily) for constipation: Developed diarrhea in the hospital -Furosemide (Lasix) 10 mg solution (40 mg IV daily): Only for one-time dose prior to your hospitalization -Guaifenesin (Robitussin) 100 mg/5ML liquid (5 mL by mouth every 6 hours) for cough  Please continue taking: -Bethanechol (Urecholine) 10 mg tablet (10 mg) 2-3 times daily) for dry mouth -Deficient saliva (Biotene oral balance dry mouth) gel (apply once to mouth or throat as needed) for dry mouth -Menthol-zinc oxide (Calmoseptine) ointment (apply once to sacral area) for pressure ulcer -Carboxymethylcellulose sodium 1% gel (Place 1 drop in eyes in the morning, at noon, and at bedtime) for dry eyes -Escitalopram (Lexapro) 10 mg tablet (10 mg in the feeding tube daily) for anxiety -Fentanyl (Duragesic) 25 mcg/HR (1 patch onto the skin every 3 days) for pain -Ferrous sulfate 325 mg tablet (1 tablet per G-tube every 2 days) for anemia -Fiber (Neutrisource fiber) packet (1 packet feeding tube twice daily) for  constipation -Hydromorphone (Dilaudid) 2 mg tablet (placed 0.5 tablet into feeding tube every 8 hours as needed) for pain -Hydroxyzine (Atarax/Vistaril) 25 mg tablet (placed 25 mg into feeding tube 3 times daily) for anxiety -Ipratropium-albuterol (DuoNeb) solution (3 mL by nebulization every 4 hours as needed) for shortness of breath -Lactulose (Chronulac) 10 GM/15 mL solution (30 mL into feeding tube twice daily as needed) for constipation -Lansoprazole (Prevacid) 30 mg tablet (30 mg into feeding tube daily at noon) for acid reflux -Levocetirizine (Xyzal) 5 mg tablet (5 mg at the feeding tube every evening) for sleep -Lorazepam (Ativan) 0.5 mg tablet (placed 1 tablet 3 to every 8 hours as needed) for anxiety -Melatonin 10 mg tablets (give 10 mg x 2 by bedtime) for sleep -Meropenem (Merrem) 1 g injection (inject 1 g into the vein every 8 hours for 5 days; finish on 06/26/22) -Midodrine (primary team) 10 mg tablet (1 tablet feeding tube 3 times daily) for blood pressure -Mouthwashes (mouth rinse) liquid solution (15 mL mouth rinse in the morning and at bedtime) for dry mouth -Mupirocin ointment (Bactroban) 2% (applied to nose 2 times daily) for dry skin -Ondansetron (Zofran) 4 mg tablet (4 mg by mouth every 8 hours as needed) for nausea and vomiting -Ethylene glycol (MiraLAX) 17 g packet (placed 17 g in the feeding tube twice daily) for constipation -Pregabalin (Lyrica) 100 mg tablet (placed 1 capsule into feeding tube 3 times daily) for nerve pain -Neomy-Bacit-Polymyx-Pramoxine (triple antibiotic plus) 1% ointment (apply 1 application to great toe and leave open to air) for toe wound  Follow-up: - Please follow-up with your primary care provider bell, Esther Hardy, MD within the next 1-2 weeks.  - Please follow-up at  the [facility] for further evaluation of your neurological symptoms.

## 2022-06-21 NOTE — TOC Transition Note (Signed)
Transition of Care [facility]) - CM/SW Discharge Note   Patient Details  Name: Andrew Rivera MRN: [id number] Date of Birth: 08/02/1951  [facility] CM/SW Contact:  Ralene Bathe, LCSWA Phone Number: 06/21/2022, 1:30 PM   Clinical Narrative:    Patient will DC to:  [facility] Anticipated DC date:  06/21/2022 Family notified: Yes Transport by: Melburn Hake   Per MD patient ready for DC to SNF. RN to call report prior to discharge [phone]) room 303A.  The receiving MD is Dr. Lum Keas.   RN, patient, patient's family, and facility notified of DC. Discharge Summary sent to facility. DC packet on chart. [facility] transport to be requested by RN for patient.   CSW will sign off for now as social work intervention is no longer needed. Please consult [place] again if new needs arise.     Final next level of care: Skilled Nursing Facility Barriers to Discharge: Barriers Resolved   Patient Goals and CMS Choice        Discharge Placement              Patient chooses bed at:  ([facility]) Patient to be transferred to facility by: [facility] Name of family member notified: Darin Arndt Patient and family notified of of transfer: 06/21/22  Discharge Plan and Services                                     Social Determinants of Health (SDOH) Interventions     Readmission Risk Interventions     No data to display

## 2022-06-23 LAB — CULTURE, BLOOD (ROUTINE X 2)
Culture: NO GROWTH
Culture: NO GROWTH
Special Requests: ADEQUATE

## 2022-10-07 ENCOUNTER — Other Ambulatory Visit: Payer: Self-pay

## 2022-10-07 ENCOUNTER — Emergency Department (HOSPITAL_COMMUNITY)
Admission: EM | Admit: 2022-10-07 | Discharge: 2022-10-07 | Disposition: A | Discharge status: Skilled Nursing Facility | Payer: Medicare HMO | Attending: Emergency Medicine | Admitting: Emergency Medicine

## 2022-10-07 ENCOUNTER — Emergency Department (HOSPITAL_COMMUNITY): Payer: Medicare HMO

## 2022-10-07 DIAGNOSIS — Z931 Gastrostomy status: Secondary | ICD-10-CM | POA: Insufficient documentation

## 2022-10-07 DIAGNOSIS — R5383 Other fatigue: Secondary | ICD-10-CM | POA: Insufficient documentation

## 2022-10-07 DIAGNOSIS — M62462 Contracture of muscle, left lower leg: Secondary | ICD-10-CM | POA: Diagnosis not present

## 2022-10-07 DIAGNOSIS — Z9911 Dependence on respirator [ventilator] status: Secondary | ICD-10-CM | POA: Insufficient documentation

## 2022-10-07 DIAGNOSIS — M62461 Contracture of muscle, right lower leg: Secondary | ICD-10-CM | POA: Insufficient documentation

## 2022-10-07 DIAGNOSIS — R519 Headache, unspecified: Secondary | ICD-10-CM | POA: Insufficient documentation

## 2022-10-07 DIAGNOSIS — M62421 Contracture of muscle, right upper arm: Secondary | ICD-10-CM | POA: Diagnosis not present

## 2022-10-07 DIAGNOSIS — L98429 Non-pressure chronic ulcer of back with unspecified severity: Secondary | ICD-10-CM | POA: Diagnosis not present

## 2022-10-07 DIAGNOSIS — R0602 Shortness of breath: Secondary | ICD-10-CM | POA: Insufficient documentation

## 2022-10-07 DIAGNOSIS — R61 Generalized hyperhidrosis: Secondary | ICD-10-CM | POA: Diagnosis not present

## 2022-10-07 DIAGNOSIS — N3000 Acute cystitis without hematuria: Secondary | ICD-10-CM | POA: Insufficient documentation

## 2022-10-07 DIAGNOSIS — R110 Nausea: Secondary | ICD-10-CM | POA: Diagnosis present

## 2022-10-07 DIAGNOSIS — R531 Weakness: Secondary | ICD-10-CM | POA: Diagnosis not present

## 2022-10-07 DIAGNOSIS — M62422 Contracture of muscle, left upper arm: Secondary | ICD-10-CM | POA: Diagnosis not present

## 2022-10-07 LAB — CBC WITH DIFFERENTIAL/PLATELET
Abs Immature Granulocytes: 0.04 10*3/uL (ref 0.00–0.07)
Basophils Absolute: 0 10*3/uL (ref 0.0–0.1)
Basophils Relative: 0 %
Eosinophils Absolute: 0 10*3/uL (ref 0.0–0.5)
Eosinophils Relative: 0 %
HCT: 37.1 % — ABNORMAL LOW (ref 39.0–52.0)
Hemoglobin: 12.4 g/dL — ABNORMAL LOW (ref 13.0–17.0)
Immature Granulocytes: 0 %
Lymphocytes Relative: 5 %
Lymphs Abs: 0.7 10*3/uL (ref 0.7–4.0)
MCH: 31.3 pg (ref 26.0–34.0)
MCHC: 33.4 g/dL (ref 30.0–36.0)
MCV: 93.7 fL (ref 80.0–100.0)
Monocytes Absolute: 0.7 10*3/uL (ref 0.1–1.0)
Monocytes Relative: 5 %
Neutro Abs: 12.1 10*3/uL — ABNORMAL HIGH (ref 1.7–7.7)
Neutrophils Relative %: 90 %
Platelets: 221 10*3/uL (ref 150–400)
RBC: 3.96 MIL/uL — ABNORMAL LOW (ref 4.22–5.81)
RDW: 14 % (ref 11.5–15.5)
WBC: 13.5 10*3/uL — ABNORMAL HIGH (ref 4.0–10.5)
nRBC: 0 % (ref 0.0–0.2)

## 2022-10-07 LAB — URINALYSIS, ROUTINE W REFLEX MICROSCOPIC
Bilirubin Urine: NEGATIVE
Glucose, UA: NEGATIVE mg/dL
Hgb urine dipstick: NEGATIVE
Ketones, ur: NEGATIVE mg/dL
Nitrite: POSITIVE — AB
Protein, ur: NEGATIVE mg/dL
Specific Gravity, Urine: 1.014 (ref 1.005–1.030)
WBC, UA: >50 WBC/hpf — ABNORMAL HIGH (ref 0–5)
pH: 5 (ref 5.0–8.0)

## 2022-10-07 LAB — COMPREHENSIVE METABOLIC PANEL
ALT: 22 U/L (ref 0–44)
AST: 23 U/L (ref 15–41)
Albumin: 3.2 g/dL — ABNORMAL LOW (ref 3.5–5.0)
Alkaline Phosphatase: 117 U/L (ref 38–126)
Anion gap: 9 (ref 5–15)
BUN: 20 mg/dL (ref 8–23)
CO2: 30 mmol/L (ref 22–32)
Calcium: 8.7 mg/dL — ABNORMAL LOW (ref 8.9–10.3)
Chloride: 100 mmol/L (ref 98–111)
Creatinine, Ser: <0.3 mg/dL — ABNORMAL LOW (ref 0.61–1.24)
Glucose, Bld: 132 mg/dL — ABNORMAL HIGH (ref 70–99)
Potassium: 3.7 mmol/L (ref 3.5–5.1)
Sodium: 139 mmol/L (ref 135–145)
Total Bilirubin: 0.6 mg/dL (ref 0.3–1.2)
Total Protein: 7.7 g/dL (ref 6.5–8.1)

## 2022-10-07 LAB — LIPASE, BLOOD: Lipase: 23 U/L (ref 11–51)

## 2022-10-07 MED ORDER — NITROFURANTOIN MONOHYD MACRO 100 MG PO CAPS: 100.0000 mg | ORAL_CAPSULE | Freq: Two times a day (BID) | ORAL | 0 refills | Status: DC

## 2022-10-07 MED ORDER — IOHEXOL 350 MG/ML SOLN
75.0000 mL | Freq: Once | INTRAVENOUS | Status: AC | PRN
  Administered 2022-10-07: 75 mL via INTRAVENOUS

## 2022-10-07 MED ORDER — ONDANSETRON HCL 4 MG/2ML IJ SOLN
4.0000 mg | Freq: Once | INTRAMUSCULAR | Status: AC
  Administered 2022-10-07: 4 mg via INTRAVENOUS
  Filled 2022-10-07: qty 2

## 2022-10-07 NOTE — ED Provider Notes (Signed)
Care assumed from previous provider at shift change.  See note for full HPI.  Patient 71 year old from [facility], trach, vent dependent, PEG tube dependent here for evaluation of nausea.  No emesis.  No abdominal pain.  Normal mentation per wife.  Some increased generalized weakness however no focal deficits per previous provider.  Plan on follow-up on urinalysis and CT AP.  If CT scan without significant normality can likely DC home.  N.p.o. at baseline.  Physical Exam  BP (!) 152/51   Pulse 73   Temp 97.6 F (36.4 C) (Oral)   Resp 16   SpO2 100%   Physical Exam Vitals and nursing note reviewed.  Constitutional:      General: He is not in acute distress.    Appearance: He is well-developed. He is ill-appearing (chronically ill appearing). He is not toxic-appearing or diaphoretic.  HENT:     Head: Normocephalic and atraumatic.     Nose: Nose normal.     Mouth/Throat:     Mouth: Mucous membranes are dry.  Eyes:     Pupils: Pupils are equal, round, and reactive to light.  Neck:     Comments: Trach midline without surrounding erythema, warmth, drianage Cardiovascular:     Rate and Rhythm: Normal rate and regular rhythm.     Pulses: Normal pulses.     Heart sounds: Normal heart sounds.  Pulmonary:     Effort: Pulmonary effort is normal. No respiratory distress.     Breath sounds: Normal breath sounds.     Comments: Clear at bases Abdominal:     General: Bowel sounds are normal. There is no distension.     Palpations: Abdomen is soft.     Tenderness: There is no abdominal tenderness. There is no guarding or rebound.  Musculoskeletal:        General: No swelling or tenderness. Normal range of motion.     Cervical back: Normal range of motion and neck supple.     Right lower leg: No edema.     Left lower leg: No edema.  Skin:    General: Skin is warm and dry.     Comments: Sacral wounds  Neurological:     Mental Status: He is alert. Mental status is at baseline.     Comments:  Baseline per wife can nod yes, no to answers, mouths answers     Procedures  Procedures Labs Reviewed  CBC WITH DIFFERENTIAL/PLATELET - Abnormal; Notable for the following components:      Result Value   WBC 13.5 (*)    RBC 3.96 (*)    Hemoglobin 12.4 (*)    HCT 37.1 (*)    Neutro Abs 12.1 (*)    All other components within normal limits  COMPREHENSIVE METABOLIC PANEL - Abnormal; Notable for the following components:   Glucose, Bld 132 (*)    Creatinine, Ser <0.30 (*)    Calcium 8.7 (*)    Albumin 3.2 (*)    All other components within normal limits  URINALYSIS, ROUTINE W REFLEX MICROSCOPIC - Abnormal; Notable for the following components:   APPearance CLOUDY (*)    Nitrite POSITIVE (*)    Leukocytes,Ua LARGE (*)    WBC, UA >50 (*)    Bacteria, UA MANY (*)    All other components within normal limits  URINE CULTURE  LIPASE, BLOOD   CT ABDOMEN PELVIS W CONTRAST  Result Date: 10/07/2022 CLINICAL DATA:  Nausea for 24 hours. Abdominal pain acute nonlocalized. EXAM: CT ABDOMEN  AND PELVIS WITH CONTRAST TECHNIQUE: Multidetector CT imaging of the abdomen and pelvis was performed using the standard protocol following bolus administration of intravenous contrast. RADIATION DOSE REDUCTION: This exam was performed according to the departmental dose-optimization program which includes automated exposure control, adjustment of the mA and/or kV according to patient size and/or use of iterative reconstruction technique. CONTRAST:  75mL OMNIPAQUE IOHEXOL 350 MG/ML SOLN COMPARISON:  CT abdomen and pelvis 06/28/2021 FINDINGS: Lower chest: Bilateral lower lobe atelectasis/consolidation. Pneumonia is difficult to exclude. Hepatobiliary: No suspicious hepatic lesion. No biliary dilation. Unremarkable gallbladder. Pancreas: Unremarkable. No pancreatic ductal dilatation or surrounding inflammatory changes. Spleen: Normal in size without focal abnormality. Adrenals/Urinary Tract: Adrenal glands are  unremarkable. Kidneys are normal, without renal calculi, focal lesion, or hydronephrosis. Irregular bladder wall thickening with multiple diverticula. Stomach/Bowel: Peg tube in the stomach. Small bowel is decompressed. Similar orientation of the large bowel with cecum in the midline. No definite volvulus. Diffuse colonic distention which is primarily gaseous. No colonic wall thickening or pericolonic edema. No pneumatosis. Appendix is not confidently visualized. Similar presacral edema. Vascular/Lymphatic: Aortic atherosclerosis. No enlarged abdominal or pelvic lymph nodes. Reproductive: Mild enlargement of the prostate. Other: Right lower quadrant hernia repair. Bilateral fat containing inguinal hernias. No free intraperitoneal air. Musculoskeletal: Postsurgical and degenerative change through the spine. Bones are demineralized. Ghost tracks in the spine from prior hardware. IMPRESSION: 1. Diffuse colonic distention similar in appearance to prior exam. Findings may be due to colonic ileus or Ogilvie syndrome. No evidence of obstruction or volvulus. No small bowel dilation. No colonic wall thickening, pericolonic edema or pneumatosis. 2. Bilateral lower lobe atelectasis/consolidation. Pneumonia is difficult to exclude. 3.  Aortic Atherosclerosis (ICD10-I70.0). Electronically Signed   By: Minerva Fester M.D.   On: 10/07/2022 21:14   DG Chest Portable 1 View  Result Date: 10/07/2022 CLINICAL DATA:  Fatigue, tracheostomy in place EXAM: PORTABLE CHEST 1 VIEW COMPARISON:  Chest x-ray June 15, 2022 FINDINGS: The cardiomediastinal silhouette is unchanged in contour. Tracheostomy in place. Bibasilar bandlike opacities, unchanged compared to numerous priors, favored to represent atelectasis/scarring. No new focal pulmonary opacity. Small bilateral pleural effusions versus pleural thickening. No large pneumothorax. The visualized upper abdomen is unremarkable. Partially visualized ACDF and cervicothoracic fusion  hardware. No acute osseous abnormality. IMPRESSION: Chronic bibasilar bandlike opacities, likely atelectasis/scarring. Electronically Signed   By: Jacob Moores M.D.   On: 10/07/2022 14:39    ED Course / MDM   Clinical Course as of 10/07/22 2202  Sat Oct 07, 2022  1353 Stable 56 YOM with extensive medical histories. Trach/PEG Nausea this morning. Labs CT AP and urine  [CC]  1846 Urinalysis, Routine w reflex microscopic(!) Urinalysis for UTI, this was a catheterized urine.  Reviewed his prior urine culture, only 1 present in epic did show multidrug resistance however sensitive to Bactrim, Macrobid, Zosyn [BH]    Clinical Course User Index [BH] Rendon Howell A, PA-C [CC] Glyn Ade, MD    Care assumed from previous provider at shift change.  See note for full HPI.  In summation 71 year old trach/vent/PEG dependent here for evaluation of nausea this morning.  No emesis.  No abdominal pain.  At baseline mentation per significant other.  No fever, stable vital signs.  Labs without significant abnormality.  Plan on follow-up on urine, CT abdomen and pelvis.  Labs and imaging personally viewed and interpreted:  CBC leukocytosis 13.5, hemoglobin 12.4, similar to prior Metabolic panel without significant abnormality Urinalysis positive for UTI, reviewed prior cultures.  Sensitive to  Bactrim, Macrobid, Zosyn Lipase 23 CT abdomen pelvis with colonic distention similar to prior exam Xray chest with chronic bibasilar atelectasis versus scarring  Patient reassessed.  He has not had any emesis throughout his almost 9-hour emergency department stay.  He is hemodynamically stable.  Mentation at baseline.  Discussed prior urine culture reports with pharmacy.  Patient may be started on Macrobid which can be crushed and placed through PEG tube.  I discussed this plan with patient's wife, Tau Raman via telephone at [phone].  She is okay with discharge back to [facility] which I feel is  reasonable as well.    The patient has been appropriately medically screened and/or stabilized in the ED. I have low suspicion for any other emergent medical condition which would require further screening, evaluation or treatment in the ED or require inpatient management.  Patient is hemodynamically stable and in no acute distress.  Patient able to ambulate in department prior to ED.  Evaluation does not show acute pathology that would require ongoing or additional emergent interventions while in the emergency department or further inpatient treatment.  I have discussed the diagnosis with the patient and answered all questions.  Pain is been managed while in the emergency department and patient has no further complaints prior to discharge.  Patient is comfortable with plan discussed in room and is stable for discharge at this time.  I have discussed strict return precautions for returning to the emergency department.  Patient was encouraged to follow-up with PCP/specialist refer to at discharge.     Medical Decision Making Amount and/or Complexity of Data Reviewed Independent Historian: spouse External Data Reviewed: labs, radiology, ECG and notes. Labs: ordered. Decision-making details documented in ED Course. Radiology: ordered and independent interpretation performed. Decision-making details documented in ED Course. ECG/medicine tests: ordered and independent interpretation performed. Decision-making details documented in ED Course.  Risk OTC drugs. Prescription drug management. Parenteral controlled substances. Decision regarding hospitalization. Diagnosis or treatment significantly limited by social determinants of health.   UTI Peg tube dependent Vent dependent       Gidget Quizhpi A, PA-C 10/07/22 2202    Lorre Nick, MD 10/11/22 2117

## 2022-10-07 NOTE — ED Notes (Signed)
[facility] called for transport back to [facility] 

## 2022-10-07 NOTE — Discharge Instructions (Addendum)
Urinalysis showed urinary tract infection.  Started on antibiotics, Macrobid based on prior Urine culture report sensitivities.  Can crush and place into PEG tube per pharmacist.

## 2022-10-07 NOTE — ED Triage Notes (Signed)
Patient bib [facility] from [facility] with complaints of nausea x24hrs. No vomiting no fever. Per facility Patient is usually hypotensive but has been running 140s.

## 2022-10-07 NOTE — ED Notes (Signed)
Patient transported to CT with respiratory and this nurse

## 2022-10-07 NOTE — Progress Notes (Signed)
Patient transported to CT and back without any complications.  

## 2022-10-07 NOTE — ED Provider Notes (Signed)
[facility] Provider Note   CSN: [id number] Arrival date & time: 10/07/22  1317  History obtained from wife as well as pt who communicates with gesturing and mouthing words but appears to be a reliable historian.   History  Chief Complaint  Patient presents with   Nausea    Gaetana MichaelisDenis A Schrag is a 71 y.o. 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 Pt and wife report he was feeling at his baseline last night and when he first woke up this morning before he suddenly felt very nauseated, more fatigued than normal, and with a slight generalized headache. He denies feeling particularly more weak on one side compared to the other (when considering his baseline) and he also denies vomiting, diarrhea, fever, cough, congestion, chest pain, abdominal pain, back pain, or dysuria. At baseline, he has some shortness of breath and states this may be slightly more than normal but was not a major concern for him. Wife reports that pt has known sacral wounds monitored by wound care nurse at facility where he resides ([facility]) and wounds have not recently been infected or required antibiotics. Wife denies pt having any recent hospitalizations or new medications. Pt is dependent on tube feeds and states he has been receiving these normally. He says his nausea has improved since onset this morning but is still minimally present.       Home Medications Prior to Admission medications   Medication Sig Start Date End Date Taking? Authorizing Provider  acetaminophen (TYLENOL) 500 MG tablet Place 2 tablets (1,000 mg total) into feeding tube every 8 (eight) hours. 06/21/22 06/21/23  Mapp, Gaylyn Cheersavien, MD  Artificial Saliva (BIOTENE ORALBALANCE DRY MOUTH) GEL Use as directed 1 Application  in the mouth or throat as needed (dry mouth and throat).    [provider]  bethanechol (URECHOLINE) 10 MG tablet Place 10 mg into feeding tube 3 (three) times daily.    [provider]  Carboxymethylcellulose Sodium 1 % GEL Place 1 drop into both eyes in the morning, at noon, and at bedtime.    [provider]  escitalopram (LEXAPRO) 10 MG tablet Place 10 mg into feeding tube daily. 04/27/22   [provider]  fentaNYL (DURAGESIC) 25 MCG/HR Place 1 patch onto the skin every 3 (three) days. 06/09/21   Russella DarEllis, Allison L, NP  ferrous sulfate 325 (65 FE) MG tablet Take 325 mg by mouth See admin instructions. Take one tablet by mouth every 2 days per g-tube    [provider]  fiber (NUTRISOURCE FIBER) PACK packet Place 1 packet into feeding tube 2 (two) times daily. 06/09/21   Russella DarEllis, Allison L, NP  HYDROmorphone (DILAUDID) 2 MG tablet Place 0.5 tablets (1 mg total) into feeding tube every 8 (eight) hours as needed for severe pain. 06/21/22   Belva AgeeKatsadouros, Vasilios, MD  hydrOXYzine (ATARAX/VISTARIL) 25 MG tablet Place 25 mg into feeding tube 3 (three) times daily.    [provider]  ipratropium-albuterol (DUONEB) 0.5-2.5 (3) MG/3ML SOLN Take 3 mLs by nebulization every 4 (four) hours as needed (respiratory failure).    [provider]  lactulose, encephalopathy, (CHRONULAC) 10 GM/15ML SOLN Place 30 mLs (20 g total) into feeding tube 2 (two) times daily as needed for mild constipation. 06/30/21   Eliezer ChampagneGeorge, Timothy S, NP  lansoprazole (PREVACID) 30  MG capsule Place 30 mg into feeding tube daily at 12 noon.    [provider]  levocetirizine (XYZAL) 5 MG tablet Place 5 mg into feeding tube every evening.    [provider]  LORazepam (ATIVAN) 0.5 MG tablet Place 1 tablet (0.5 mg total) into feeding tube every 12 (twelve) hours as needed for anxiety. 06/21/22   Belva Agee, MD  Melatonin 10 MG TABS Give 10 mg by tube at bedtime.     [provider]  Menthol-Zinc Oxide (CALMOSEPTINE) 0.44-20.6 % OINT Apply 1 application topically See admin instructions. Apply thin layer of calmoseptine to sacral area, between brief changes for redness    [provider]  midodrine (PROAMATINE) 10 MG tablet Place 1 tablet (10 mg total) into feeding tube 3 (three) times daily. 06/30/21   Eliezer Champagne, NP  Mouthwashes (MOUTH RINSE) LIQD solution 15 mLs by Mouth Rinse route in the morning and at bedtime. Patient not taking: No sig reported 06/30/21   Eliezer Champagne, NP  mupirocin ointment (BACTROBAN) 2 % Place 1 application into the nose 2 (two) times daily. Patient not taking: No sig reported 06/30/21   Eliezer Champagne, NP  Neomy-Bacit-Polymyx-Pramoxine (TRIPLE ANTIBIOTIC PLUS) 1 % OINT Apply 1 application topically See admin instructions. Apply thin layer to right great toe until healed leave open to air.    [provider]  Nutritional Supplements (FEEDING SUPPLEMENT, OSMOLITE 1.5 CAL,) LIQD Place 1,000 mLs into feeding tube continuous. 06/21/22   Mapp, Gaylyn Cheers, MD  ondansetron (ZOFRAN-ODT) 4 MG disintegrating tablet Take 4 mg by mouth every 8 (eight) hours as needed for nausea or vomiting.    [provider]  polyethylene glycol (MIRALAX / GLYCOLAX) 17 g packet Place 17 g into feeding tube 2 (two) times daily. 06/09/21   Russella Dar, NP  pregabalin (LYRICA) 100 MG capsule Place 1 capsule (100 mg total) into feeding tube 3 (three) times daily. 06/21/22   Belva Agee, MD  Protein (FEEDING SUPPLEMENT, PROSOURCE TF20,) liquid Place 60 mLs into feeding tube daily. 06/21/22   Mapp, Gaylyn Cheers, MD  Water For Irrigation, Sterile (FREE WATER) SOLN Place 150 mLs into feeding tube every 4 (four) hours. 06/21/22   Mapp, Gaylyn Cheers, MD  sertraline (ZOLOFT) 50 MG tablet Place 50 mg into feeding tube at bedtime.  04/28/21  [provider]      Allergies    Chlorhexidine, Codeine, and Oxycodone-acetaminophen     Review of Systems   Review of Systems  Unable to perform ROS: Patient nonverbal  See HPI for obtained ROS. Wife is at bedside and pt does seem to have baseline mental status but as he is not able to verbalize responses will consider history limited.   Physical Exam Updated Vital Signs BP (!) 141/52   Pulse 65   Temp 97.6 F (36.4 C) (Oral)   Resp 16   SpO2 96%  Physical Exam Vitals and nursing note reviewed.  Constitutional:      General: He is not in acute distress.    Appearance: He is diaphoretic (mildly to forehead). He is not toxic-appearing.  HENT:     Head: Normocephalic and atraumatic.     Nose: No congestion.     Mouth/Throat:     Mouth: Mucous membranes are moist.  Eyes:     General: No scleral icterus.    Extraocular Movements: Extraocular movements intact.  Neck:     Comments: Tracheostomy with vent in place, no surrounding erythema, warmth, tenderness,  drainage, or signs of infection Cardiovascular:     Rate and Rhythm: Normal rate and regular rhythm.     Heart sounds: Normal heart sounds.  Pulmonary:     Effort: Pulmonary effort is normal. No respiratory distress.     Breath sounds: Normal breath sounds. No wheezing, rhonchi or rales.  Abdominal:     General: Abdomen is flat. Bowel sounds are normal. There is no distension.     Palpations: Abdomen is soft.     Tenderness: There is no abdominal tenderness. There is no guarding or rebound.  Musculoskeletal:     Right lower leg: No edema.     Left lower leg: No edema.     Comments: Mild contractures to bilateral upper and lower extremities, minimal resistance with bilateral grip strength, slightly stronger on right compared to left, no active motor function in lower extremities, wife at bedside confirms this is baseline  Skin:    General: Skin is warm.     Capillary Refill: Capillary refill takes less than 2 seconds.     Coloration: Skin is not pale.     Findings: No rash.  Neurological:     Mental  Status: He is alert.     Comments: Difficult to assess but appears consistent with pt's baseline neurological function, motor strength as mentioned above, nonverbal but will mouth and gesture with appropriate responses to questions, alert and responsive to external stimuli   Psychiatric:     Comments: Difficult to assess with consideration of pt's baseline mental status and functionality but appears appropriate and grossly intact     ED Results / Procedures / Treatments   Labs (all labs ordered are listed, but only abnormal results are displayed) Labs Reviewed  CBC WITH DIFFERENTIAL/PLATELET  COMPREHENSIVE METABOLIC PANEL  LIPASE, BLOOD  URINALYSIS, ROUTINE W REFLEX MICROSCOPIC    EKG None  Radiology DG Chest Portable 1 View  Result Date: 10/07/2022 CLINICAL DATA:  Fatigue, tracheostomy in place EXAM: PORTABLE CHEST 1 VIEW COMPARISON:  Chest x-ray June 15, 2022 FINDINGS: The cardiomediastinal silhouette is unchanged in contour. Tracheostomy in place. Bibasilar bandlike opacities, unchanged compared to numerous priors, favored to represent atelectasis/scarring. No new focal pulmonary opacity. Small bilateral pleural effusions versus pleural thickening. No large pneumothorax. The visualized upper abdomen is unremarkable. Partially visualized ACDF and cervicothoracic fusion hardware. No acute osseous abnormality. IMPRESSION: Chronic bibasilar bandlike opacities, likely atelectasis/scarring. Electronically Signed   By: Jacob Moores M.D.   On: 10/07/2022 14:39    Procedures None  Medications Ordered in ED Medications  ondansetron (ZOFRAN) injection 4 mg (has no administration in time range)    ED Course/ Medical Decision Making/ A&P Clinical Course as of 10/07/22 1533  Sat Oct 07, 2022  1353 Stable 71 YOM with extensive medical histories. Trach/PEG Nausea this morning. Labs CT AP and urine  [CC]    Clinical Course User Index [CC] Glyn Ade, MD                            Medical Decision Making Amount and/or Complexity of Data Reviewed Labs: ordered. Decision-making details documented in ED Course. Radiology: ordered. Decision-making details documented in ED Course.  Risk Prescription drug management.   71 year old chronically ill male dependent on trach with vent and g-tube brought to ED by wife with acute nausea and fatigue onset this morning. Neurologically, pt appears to be at his baseline and there were no identifiable acute deficits. No  evidence of acute abdomen with abdomen non-tender and without guarding, rebound, or peritoneal signs. Pt afebrile, no tachycardia, unremarkable vital signs. Despite nausea, he has not had any vomiting, diarrhea, fever, abdominal pain, or other acute pain to better explain symptoms which are fairly non-specific. With his significant medical history, ruling out UTI, pneumonia, wound infection (sacrum to be evaluated by oncoming PA prior to disposition), or other intra-abdominal process but overall pt is fairly well appearing with unremarkable exam and suspect he will be able to be discharged back to his home facility. Care transitioned to oncoming PA Henderly pending workup completion. Case discussed with attending MD who agreed with ED management.          Final Clinical Impression(s) / ED Diagnoses Final diagnoses:  None    Rx / DC Orders ED Discharge Orders     None         Tonette Lederer, PA-C 10/07/22 1539    Glyn Ade, MD 10/08/22 [phone]

## 2022-10-09 LAB — URINE CULTURE: Culture: >=100000 — AB

## 2022-10-10 ENCOUNTER — Telehealth (HOSPITAL_BASED_OUTPATIENT_CLINIC_OR_DEPARTMENT_OTHER): Payer: Self-pay | Admitting: *Deleted

## 2022-10-10 NOTE — Telephone Encounter (Signed)
Post ED Visit - Positive Culture Follow-up  Culture report reviewed by antimicrobial stewardship pharmacist: [facility] []  , Pharm.D. []  Enzo Bi, Pharm.D., BCPS AQ-ID []  , Pharm.D., BCPS []  Celedonio Miyamoto, Pharm.D., BCPS []  [place], Garvin Fila.D., BCPS, AAHIVP []  , Pharm.D., BCPS, AAHIVP []  Georgina Pillion, PharmD, BCPS []  , PharmD, BCPS []  [place], PharmD, BCPS []  [address], PharmD []  , PharmD, BCPS []  Estella Husk, PharmD  Pharmacy Team []  Lysle Pearl, PharmD []  , PharmD []  Phillips Climes, PharmD []  , Rph []  Agapito Games) , PharmD []  Verlan Friends, PharmD []  , PharmD []  Mervyn Gay, PharmD []  , PharmD []  Vinnie Level, PharmD []  [facility], PharmD []  , PharmD []  Len Childs, PharmD   Positive urine culture Treated with Nitrofurantoin Monohyd Macro, organism sensitive to the same and no further patient follow-up is required at this time.  , PharmD  Greer Pickerel Talley 10/10/2022, 10:18 AM

## 2023-05-12 IMAGING — DX DG CHEST 1V PORT
1 series · 1 of 1 positions shown · non-contrast
Comparison: 04/23/2021

CLINICAL DATA: Sepsis

EXAM:
PORTABLE CHEST 1 VIEW

[chest ap]
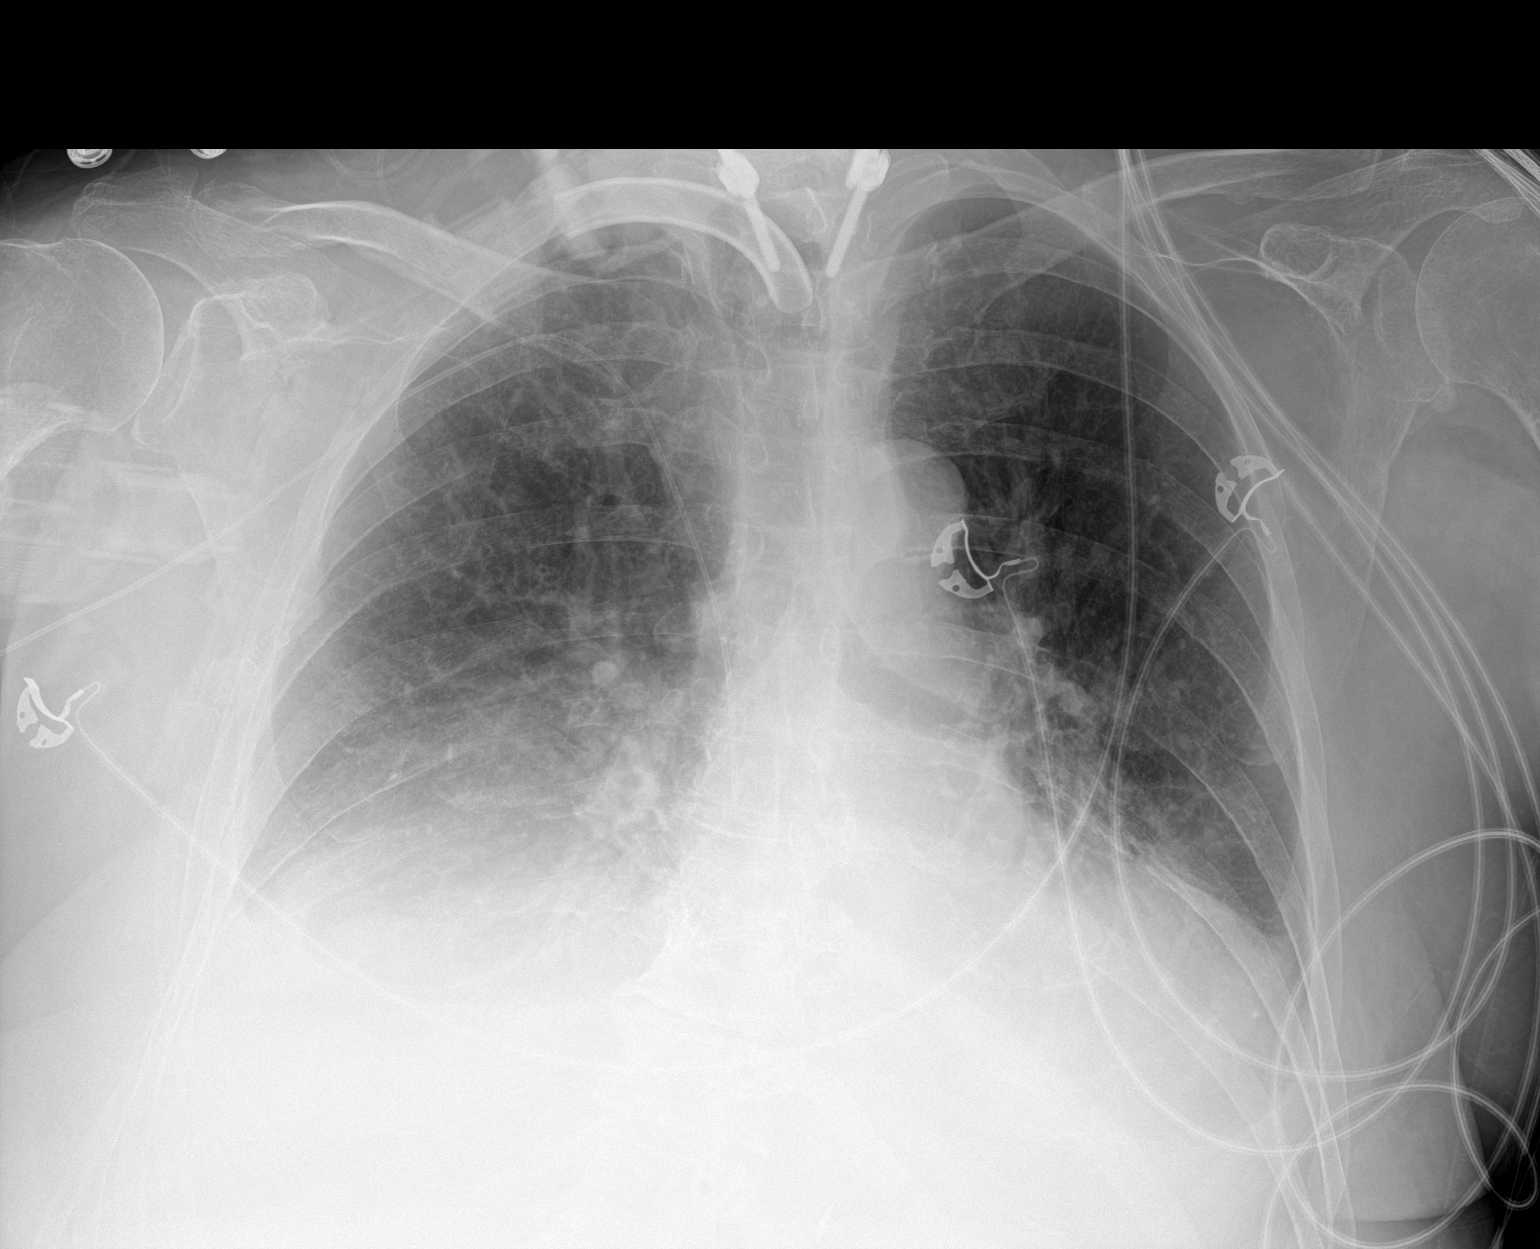

[1 of 1 positions shown; findings below may reference images not displayed]

FINDINGS: Small layering right pleural effusion. Trace left pleural effusion.
No frank interstitial edema.

Heart is normal in size.

Tracheostomy in satisfactory position.

Right arm PICC terminates cavoatrial junction.
IMPRESSION: Small layering right pleural effusion.  Trace left pleural effusion.

Support apparatus as above.

## 2023-09-07 ENCOUNTER — Other Ambulatory Visit: Payer: Self-pay

## 2023-09-07 ENCOUNTER — Encounter (HOSPITAL_COMMUNITY): Payer: Self-pay | Admitting: Emergency Medicine

## 2023-09-07 ENCOUNTER — Inpatient Hospital Stay (HOSPITAL_COMMUNITY)
Admission: EM | Admit: 2023-09-07 | Discharge: 2023-09-12 | DRG: 662 | Disposition: A | Discharge status: Other Acute Inpatient Hospital | Payer: Medicare HMO | Source: Other Acute Inpatient Hospital | Attending: Student | Admitting: Student

## 2023-09-07 DIAGNOSIS — D62 Acute posthemorrhagic anemia: Secondary | ICD-10-CM | POA: Diagnosis not present

## 2023-09-07 DIAGNOSIS — Z6830 Body mass index (BMI) 30.0-30.9, adult: Secondary | ICD-10-CM

## 2023-09-07 DIAGNOSIS — R000 Tachycardia, unspecified: Secondary | ICD-10-CM | POA: Diagnosis present

## 2023-09-07 DIAGNOSIS — Z883 Allergy status to other anti-infective agents status: Secondary | ICD-10-CM

## 2023-09-07 DIAGNOSIS — R319 Hematuria, unspecified: Principal | ICD-10-CM

## 2023-09-07 DIAGNOSIS — G894 Chronic pain syndrome: Secondary | ICD-10-CM | POA: Diagnosis present

## 2023-09-07 DIAGNOSIS — J9611 Chronic respiratory failure with hypoxia: Secondary | ICD-10-CM | POA: Diagnosis not present

## 2023-09-07 DIAGNOSIS — Z79899 Other long term (current) drug therapy: Secondary | ICD-10-CM

## 2023-09-07 DIAGNOSIS — E669 Obesity, unspecified: Secondary | ICD-10-CM | POA: Diagnosis not present

## 2023-09-07 DIAGNOSIS — Z931 Gastrostomy status: Secondary | ICD-10-CM | POA: Diagnosis not present

## 2023-09-07 DIAGNOSIS — Z930 Tracheostomy status: Secondary | ICD-10-CM | POA: Diagnosis not present

## 2023-09-07 DIAGNOSIS — R310 Gross hematuria: Principal | ICD-10-CM | POA: Diagnosis present

## 2023-09-07 DIAGNOSIS — N3289 Other specified disorders of bladder: Secondary | ICD-10-CM | POA: Diagnosis not present

## 2023-09-07 DIAGNOSIS — I10 Essential (primary) hypertension: Secondary | ICD-10-CM | POA: Diagnosis present

## 2023-09-07 DIAGNOSIS — R1310 Dysphagia, unspecified: Secondary | ICD-10-CM | POA: Diagnosis present

## 2023-09-07 DIAGNOSIS — Z8619 Personal history of other infectious and parasitic diseases: Secondary | ICD-10-CM

## 2023-09-07 DIAGNOSIS — F32A Depression, unspecified: Secondary | ICD-10-CM | POA: Diagnosis not present

## 2023-09-07 DIAGNOSIS — G8250 Quadriplegia, unspecified: Secondary | ICD-10-CM | POA: Diagnosis present

## 2023-09-07 DIAGNOSIS — E871 Hypo-osmolality and hyponatremia: Secondary | ICD-10-CM | POA: Diagnosis not present

## 2023-09-07 DIAGNOSIS — E876 Hypokalemia: Secondary | ICD-10-CM | POA: Diagnosis present

## 2023-09-07 DIAGNOSIS — D72829 Elevated white blood cell count, unspecified: Secondary | ICD-10-CM | POA: Diagnosis present

## 2023-09-07 DIAGNOSIS — F411 Generalized anxiety disorder: Secondary | ICD-10-CM | POA: Diagnosis present

## 2023-09-07 DIAGNOSIS — J309 Allergic rhinitis, unspecified: Secondary | ICD-10-CM | POA: Diagnosis not present

## 2023-09-07 DIAGNOSIS — Z87891 Personal history of nicotine dependence: Secondary | ICD-10-CM | POA: Diagnosis not present

## 2023-09-07 DIAGNOSIS — Z9911 Dependence on respirator [ventilator] status: Secondary | ICD-10-CM | POA: Diagnosis not present

## 2023-09-07 DIAGNOSIS — Z885 Allergy status to narcotic agent status: Secondary | ICD-10-CM

## 2023-09-07 DIAGNOSIS — Z79891 Long term (current) use of opiate analgesic: Secondary | ICD-10-CM

## 2023-09-07 HISTORY — DX: Allergic rhinitis, unspecified: J30.9

## 2023-09-07 HISTORY — DX: Generalized anxiety disorder: F41.1

## 2023-09-07 HISTORY — DX: Depression, unspecified: F32.A

## 2023-09-07 LAB — COMPREHENSIVE METABOLIC PANEL
ALT: 20 U/L (ref 0–44)
AST: 22 U/L (ref 15–41)
Albumin: 2.9 g/dL — ABNORMAL LOW (ref 3.5–5.0)
Alkaline Phosphatase: 104 U/L (ref 38–126)
Anion gap: 7 (ref 5–15)
BUN: 25 mg/dL — ABNORMAL HIGH (ref 8–23)
CO2: 29 mmol/L (ref 22–32)
Calcium: 8.4 mg/dL — ABNORMAL LOW (ref 8.9–10.3)
Chloride: 101 mmol/L (ref 98–111)
Creatinine, Ser: <0.3 mg/dL — ABNORMAL LOW (ref 0.61–1.24)
Glucose, Bld: 111 mg/dL — ABNORMAL HIGH (ref 70–99)
Potassium: 3.9 mmol/L (ref 3.5–5.1)
Sodium: 137 mmol/L (ref 135–145)
Total Bilirubin: 0.4 mg/dL (ref <1.2)
Total Protein: 7.1 g/dL (ref 6.5–8.1)

## 2023-09-07 LAB — CBC WITH DIFFERENTIAL/PLATELET
Abs Immature Granulocytes: 0.05 10*3/uL (ref 0.00–0.07)
Basophils Absolute: 0.1 10*3/uL (ref 0.0–0.1)
Basophils Relative: 0 %
Eosinophils Absolute: 0.6 10*3/uL — ABNORMAL HIGH (ref 0.0–0.5)
Eosinophils Relative: 4 %
HCT: 37.4 % — ABNORMAL LOW (ref 39.0–52.0)
Hemoglobin: 12.6 g/dL — ABNORMAL LOW (ref 13.0–17.0)
Immature Granulocytes: 0 %
Lymphocytes Relative: 15 %
Lymphs Abs: 2.1 10*3/uL (ref 0.7–4.0)
MCH: 31.3 pg (ref 26.0–34.0)
MCHC: 33.7 g/dL (ref 30.0–36.0)
MCV: 93 fL (ref 80.0–100.0)
Monocytes Absolute: 1.1 10*3/uL — ABNORMAL HIGH (ref 0.1–1.0)
Monocytes Relative: 8 %
Neutro Abs: 9.9 10*3/uL — ABNORMAL HIGH (ref 1.7–7.7)
Neutrophils Relative %: 73 %
Platelets: 332 10*3/uL (ref 150–400)
RBC: 4.02 MIL/uL — ABNORMAL LOW (ref 4.22–5.81)
RDW: 14.4 % (ref 11.5–15.5)
WBC: 13.8 10*3/uL — ABNORMAL HIGH (ref 4.0–10.5)
nRBC: 0 % (ref 0.0–0.2)

## 2023-09-07 LAB — PROTIME-INR
INR: 1 (ref 0.8–1.2)
Prothrombin Time: 13.8 s (ref 11.4–15.2)

## 2023-09-07 NOTE — ED Provider Notes (Signed)
[place] EMERGENCY DEPARTMENT AT [facility] Provider Note   CSN: [id number] Arrival date & time: 09/07/23  2225     History {Add pertinent medical, surgical, social history, OB history to HPI:1} Chief Complaint  Patient presents with   Hematuria    Andrew Rivera is a 72 y.o. male.   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  EMS states for the facility noted he has had hematuria for a day.  He said some clots and has saturated a couple undergarments today with blood.  Patient was not able to speak due to his trach but can mouth answers to questions.  He states he feels well.  Denies any pain at all.  Has not had any pain with urination, does not have a chronic Foley.  Denies any recent catheterization.  He feels like his bladder is emptying okay.  Said no abdominal or flank pain.  He denies being on any blood thinners.  He said no fevers or chills.  No chest pain or shortness of breath.     Home Medications Prior to Admission medications   Medication Sig Start Date End Date Taking? Authorizing Provider  Artificial Saliva (BIOTENE ORALBALANCE DRY MOUTH) GEL Use as directed 1 Application in the mouth or throat as needed (dry mouth and throat).    [provider]  bethanechol (URECHOLINE) 10 MG tablet Place 10 mg into feeding tube 3 (three) times daily.    [provider]  Carboxymethylcellulose Sodium 1 % GEL Place 1 drop into both eyes in the morning, at noon, and at bedtime.    [provider]  escitalopram (LEXAPRO) 10 MG tablet Place 10 mg into feeding tube daily. 04/27/22   [provider]  fentaNYL (DURAGESIC) 25 MCG/HR Place 1 patch onto the skin every 3 (three) days. 06/09/21   Russella Dar, NP  ferrous sulfate 325 (65 FE) MG tablet Take 325 mg by mouth See admin  instructions. Take one tablet by mouth every 2 days per g-tube    [provider]  fiber (NUTRISOURCE FIBER) PACK packet Place 1 packet into feeding tube 2 (two) times daily. 06/09/21   Russella Dar, NP  HYDROmorphone (DILAUDID) 2 MG tablet Place 0.5 tablets (1 mg total) into feeding tube every 8 (eight) hours as needed for severe pain. 06/21/22   Belva Agee, MD  hydrOXYzine (ATARAX/VISTARIL) 25 MG tablet Place 25 mg into feeding tube 3 (three) times daily.    [provider]  ipratropium-albuterol (DUONEB) 0.5-2.5 (3) MG/3ML SOLN Take 3 mLs by nebulization every 4 (four) hours as needed (respiratory failure).    [provider]  lactulose, encephalopathy, (CHRONULAC) 10 GM/15ML SOLN Place 30 mLs (20 g total) into feeding tube 2 (two) times daily as needed for mild constipation. 06/30/21   Eliezer Champagne, NP  lansoprazole (PREVACID) 30 MG capsule Place 30 mg into feeding tube daily at 12 noon.    [provider]  levocetirizine (XYZAL) 5 MG tablet Place 5 mg into feeding tube every evening.    [provider]  LORazepam (ATIVAN) 0.5 MG tablet Place 1 tablet (0.5 mg total) into feeding tube every 12 (twelve) hours as needed for anxiety. 06/21/22   Belva Agee, MD  Melatonin 10 MG TABS Give 10 mg by tube at bedtime.    [provider]  Menthol-Zinc Oxide (CALMOSEPTINE) 0.44-20.6 %  OINT Apply 1 application topically See admin instructions. Apply thin layer of calmoseptine to sacral area, between brief changes for redness    [provider]  midodrine (PROAMATINE) 10 MG tablet Place 1 tablet (10 mg total) into feeding tube 3 (three) times daily. 06/30/21   Eliezer Champagne, NP  Mouthwashes (MOUTH RINSE) LIQD solution 15 mLs by Mouth Rinse route in the morning and at bedtime. Patient not taking: No sig reported 06/30/21   Eliezer Champagne, NP  mupirocin ointment (BACTROBAN) 2 % Place 1 application into the nose 2 (two) times  daily. Patient not taking: No sig reported 06/30/21   Eliezer Champagne, NP  Neomy-Bacit-Polymyx-Pramoxine (TRIPLE ANTIBIOTIC PLUS) 1 % OINT Apply 1 application topically See admin instructions. Apply thin layer to right great toe until healed leave open to air.    [provider]  nitrofurantoin, macrocrystal-monohydrate, (MACROBID) 100 MG capsule Take 1 capsule (100 mg total) by mouth 2 (two) times daily. 10/07/22   Henderly, Britni A, PA-C  Nutritional Supplements (FEEDING SUPPLEMENT, OSMOLITE 1.5 CAL,) LIQD Place 1,000 mLs into feeding tube continuous. 06/21/22   Mapp, Gaylyn Cheers, MD  ondansetron (ZOFRAN-ODT) 4 MG disintegrating tablet Take 4 mg by mouth every 8 (eight) hours as needed for nausea or vomiting.    [provider]  polyethylene glycol (MIRALAX / GLYCOLAX) 17 g packet Place 17 g into feeding tube 2 (two) times daily. 06/09/21   Russella Dar, NP  pregabalin (LYRICA) 100 MG capsule Place 1 capsule (100 mg total) into feeding tube 3 (three) times daily. 06/21/22   Belva Agee, MD  Protein (FEEDING SUPPLEMENT, PROSOURCE TF20,) liquid Place 60 mLs into feeding tube daily. 06/21/22   Mapp, Gaylyn Cheers, MD  Water For Irrigation, Sterile (FREE WATER) SOLN Place 150 mLs into feeding tube every 4 (four) hours. 06/21/22   Mapp, Gaylyn Cheers, MD  sertraline (ZOLOFT) 50 MG tablet Place 50 mg into feeding tube at bedtime.  04/28/21  [provider]      Allergies    Chlorhexidine, Codeine, and Oxycodone-acetaminophen    Review of Systems   Review of Systems  Genitourinary:  Positive for hematuria.  Review of systems completed and notable as per HPI.  ROS otherwise negative.   Physical Exam Updated Vital Signs Temp 98.3 F (36.8 C) (Oral)   SpO2 98%  Physical Exam Vitals and nursing note reviewed. Exam conducted with a chaperone present.  Constitutional:      General: He is not in acute distress.    Appearance: He is well-developed.  HENT:     Head: Normocephalic  and atraumatic.     Nose: Nose normal.     Mouth/Throat:     Mouth: Mucous membranes are moist.     Pharynx: Oropharynx is clear.  Eyes:     Extraocular Movements: Extraocular movements intact.     Conjunctiva/sclera: Conjunctivae normal.     Pupils: Pupils are equal, round, and reactive to light.  Cardiovascular:     Rate and Rhythm: Normal rate and regular rhythm.     Pulses: Normal pulses.     Heart sounds: Normal heart sounds. No murmur heard. Pulmonary:     Effort: Pulmonary effort is normal. No respiratory distress.     Breath sounds: Normal breath sounds.  Abdominal:     Palpations: Abdomen is soft.     Tenderness: There is no abdominal tenderness. There is no guarding or rebound.  Genitourinary:    Comments: Penis and testicles are normal.  No  skin changes.  He has a small clot outside the tip of the penis.  No obvious active bleeding.  No skin changes over the penis or testicles. Musculoskeletal:        General: No swelling.     Cervical back: Neck supple.  Skin:    General: Skin is warm and dry.     Capillary Refill: Capillary refill takes less than 2 seconds.  Neurological:     General: No focal deficit present.     Mental Status: He is alert. Mental status is at baseline.  Psychiatric:        Mood and Affect: Mood normal.     ED Results / Procedures / Treatments   Labs (all labs ordered are listed, but only abnormal results are displayed) Labs Reviewed - No data to display  EKG None  Radiology No results found.  Procedures Procedures  {Document cardiac monitor, telemetry assessment procedure when appropriate:1}  Medications Ordered in ED Medications - No data to display  ED Course/ Medical Decision Making/ A&P   {   Click here for ABCD2, HEART and other calculatorsREFRESH Note before signing :1}                              Medical Decision Making Amount and/or Complexity of Data Reviewed Labs: ordered. Radiology: ordered.   Medical Decision  Making:   BALJINDER KRECH is a 72 y.o. male who presented to the ED today with gross hematuria.  Vital signs here notable for borderline tachycardia.  On exam he is well-appearing.  He is at his mental status baseline and denies any pain, recent catheterization or fevers.  He has some gross blood and hematuria on exam.  No evidence of rectal bleeding.  He is not clinically septic.  His abdominal exam is benign.  Will plan to obtain lab workup, urinalysis to evaluate for possible infection.  Also obtain CT scan evaluate for stone although no pain or possible bladder mass.   {crccomplexity:27900} Reviewed and confirmed nursing documentation for past medical history, family history, social history.  Reassessment and Plan:   ***    Patient's presentation is most consistent with {EM COPA:27473}     {Document critical care time when appropriate:1} {Document review of labs and clinical decision tools ie heart score, Chads2Vasc2 etc:1}  {Document your independent review of radiology images, and any outside records:1} {Document your discussion with family members, caretakers, and with consultants:1} {Document social determinants of health affecting pt's care:1} {Document your decision making why or why not admission, treatments were needed:1} Final Clinical Impression(s) / ED Diagnoses Final diagnoses:  None    Rx / DC Orders ED Discharge Orders     None

## 2023-09-07 NOTE — ED Notes (Signed)
Bladder scan obtained, Scanned pt x3, each scan showed 0 mL. MD aware

## 2023-09-07 NOTE — ED Triage Notes (Signed)
Pt BIB [facility] from [facility] with complaint of two saturated bloody briefs, with clots from penis, started today. Pt has no prior urological hx. A&Ox4, pt is nonverbal but able to make needs known by mouthing words. Denies pain or fever.

## 2023-09-08 ENCOUNTER — Encounter (HOSPITAL_COMMUNITY): Payer: Self-pay | Admitting: Internal Medicine

## 2023-09-08 ENCOUNTER — Emergency Department (HOSPITAL_COMMUNITY): Payer: Medicare HMO

## 2023-09-08 DIAGNOSIS — Z87891 Personal history of nicotine dependence: Secondary | ICD-10-CM | POA: Diagnosis not present

## 2023-09-08 DIAGNOSIS — J9611 Chronic respiratory failure with hypoxia: Secondary | ICD-10-CM

## 2023-09-08 DIAGNOSIS — E669 Obesity, unspecified: Secondary | ICD-10-CM | POA: Diagnosis present

## 2023-09-08 DIAGNOSIS — J301 Allergic rhinitis due to pollen: Secondary | ICD-10-CM | POA: Diagnosis not present

## 2023-09-08 DIAGNOSIS — I10 Essential (primary) hypertension: Secondary | ICD-10-CM | POA: Diagnosis present

## 2023-09-08 DIAGNOSIS — F32A Depression, unspecified: Secondary | ICD-10-CM | POA: Diagnosis present

## 2023-09-08 DIAGNOSIS — G894 Chronic pain syndrome: Secondary | ICD-10-CM | POA: Diagnosis present

## 2023-09-08 DIAGNOSIS — F411 Generalized anxiety disorder: Secondary | ICD-10-CM | POA: Diagnosis present

## 2023-09-08 DIAGNOSIS — E876 Hypokalemia: Secondary | ICD-10-CM | POA: Diagnosis present

## 2023-09-08 DIAGNOSIS — D62 Acute posthemorrhagic anemia: Secondary | ICD-10-CM | POA: Diagnosis present

## 2023-09-08 DIAGNOSIS — D72829 Elevated white blood cell count, unspecified: Secondary | ICD-10-CM | POA: Diagnosis present

## 2023-09-08 DIAGNOSIS — J309 Allergic rhinitis, unspecified: Secondary | ICD-10-CM | POA: Diagnosis present

## 2023-09-08 DIAGNOSIS — Z9911 Dependence on respirator [ventilator] status: Secondary | ICD-10-CM

## 2023-09-08 DIAGNOSIS — R310 Gross hematuria: Secondary | ICD-10-CM | POA: Diagnosis present

## 2023-09-08 DIAGNOSIS — Z883 Allergy status to other anti-infective agents status: Secondary | ICD-10-CM | POA: Diagnosis not present

## 2023-09-08 DIAGNOSIS — J449 Chronic obstructive pulmonary disease, unspecified: Secondary | ICD-10-CM | POA: Diagnosis not present

## 2023-09-08 DIAGNOSIS — R008 Other abnormalities of heart beat: Secondary | ICD-10-CM | POA: Diagnosis not present

## 2023-09-08 DIAGNOSIS — Z930 Tracheostomy status: Secondary | ICD-10-CM | POA: Diagnosis not present

## 2023-09-08 DIAGNOSIS — N3289 Other specified disorders of bladder: Secondary | ICD-10-CM | POA: Diagnosis not present

## 2023-09-08 DIAGNOSIS — Z8619 Personal history of other infectious and parasitic diseases: Secondary | ICD-10-CM | POA: Diagnosis not present

## 2023-09-08 DIAGNOSIS — E871 Hypo-osmolality and hyponatremia: Secondary | ICD-10-CM | POA: Diagnosis present

## 2023-09-08 DIAGNOSIS — Z931 Gastrostomy status: Secondary | ICD-10-CM | POA: Diagnosis not present

## 2023-09-08 DIAGNOSIS — Z6830 Body mass index (BMI) 30.0-30.9, adult: Secondary | ICD-10-CM | POA: Diagnosis not present

## 2023-09-08 DIAGNOSIS — G8250 Quadriplegia, unspecified: Secondary | ICD-10-CM | POA: Diagnosis present

## 2023-09-08 DIAGNOSIS — R000 Tachycardia, unspecified: Secondary | ICD-10-CM | POA: Diagnosis present

## 2023-09-08 DIAGNOSIS — R1310 Dysphagia, unspecified: Secondary | ICD-10-CM | POA: Diagnosis present

## 2023-09-08 LAB — COMPREHENSIVE METABOLIC PANEL
ALT: 20 U/L (ref 0–44)
AST: 17 U/L (ref 15–41)
Albumin: 3 g/dL — ABNORMAL LOW (ref 3.5–5.0)
Alkaline Phosphatase: 100 U/L (ref 38–126)
Anion gap: 8 (ref 5–15)
BUN: 22 mg/dL (ref 8–23)
CO2: 25 mmol/L (ref 22–32)
Calcium: 8.3 mg/dL — ABNORMAL LOW (ref 8.9–10.3)
Chloride: 100 mmol/L (ref 98–111)
Creatinine, Ser: <0.3 mg/dL — ABNORMAL LOW (ref 0.61–1.24)
Glucose, Bld: 115 mg/dL — ABNORMAL HIGH (ref 70–99)
Potassium: 3.9 mmol/L (ref 3.5–5.1)
Sodium: 133 mmol/L — ABNORMAL LOW (ref 135–145)
Total Bilirubin: 0.5 mg/dL (ref <1.2)
Total Protein: 7.1 g/dL (ref 6.5–8.1)

## 2023-09-08 LAB — TYPE AND SCREEN
ABO/RH(D): O NEG
Antibody Screen: NEGATIVE

## 2023-09-08 LAB — CBC WITH DIFFERENTIAL/PLATELET
Abs Immature Granulocytes: 0.07 10*3/uL (ref 0.00–0.07)
Basophils Absolute: 0.1 10*3/uL (ref 0.0–0.1)
Basophils Relative: 0 %
Eosinophils Absolute: 0.1 10*3/uL (ref 0.0–0.5)
Eosinophils Relative: 1 %
HCT: 38.2 % — ABNORMAL LOW (ref 39.0–52.0)
Hemoglobin: 12.5 g/dL — ABNORMAL LOW (ref 13.0–17.0)
Immature Granulocytes: 1 %
Lymphocytes Relative: 8 %
Lymphs Abs: 1.1 10*3/uL (ref 0.7–4.0)
MCH: 30.9 pg (ref 26.0–34.0)
MCHC: 32.7 g/dL (ref 30.0–36.0)
MCV: 94.6 fL (ref 80.0–100.0)
Monocytes Absolute: 0.8 10*3/uL (ref 0.1–1.0)
Monocytes Relative: 5 %
Neutro Abs: 12 10*3/uL — ABNORMAL HIGH (ref 1.7–7.7)
Neutrophils Relative %: 85 %
Platelets: 298 10*3/uL (ref 150–400)
RBC: 4.04 MIL/uL — ABNORMAL LOW (ref 4.22–5.81)
RDW: 14.3 % (ref 11.5–15.5)
WBC: 14.1 10*3/uL — ABNORMAL HIGH (ref 4.0–10.5)
nRBC: 0 % (ref 0.0–0.2)

## 2023-09-08 LAB — MRSA NEXT GEN BY PCR, NASAL: MRSA by PCR Next Gen: NOT DETECTED

## 2023-09-08 LAB — MAGNESIUM: Magnesium: 2.3 mg/dL (ref 1.7–2.4)

## 2023-09-08 LAB — ABO/RH: ABO/RH(D): O NEG

## 2023-09-08 MED ORDER — ONDANSETRON HCL 4 MG/2ML IJ SOLN: 4.0000 mg | Freq: Four times a day (QID) | INTRAMUSCULAR | Status: DC | PRN

## 2023-09-08 MED ORDER — CHLORHEXIDINE GLUCONATE 0.12 % MT SOLN
5.0000 mL | OROMUCOSAL | Status: DC
  Administered 2023-09-08 – 2023-09-10 (×2): 5 mL via OROMUCOSAL
  Filled 2023-09-08 (×6): qty 15

## 2023-09-08 MED ORDER — LABETALOL HCL 5 MG/ML IV SOLN
5.0000 mg | INTRAVENOUS | Status: DC | PRN
  Administered 2023-09-08 – 2023-09-10 (×3): 5 mg via INTRAVENOUS
  Filled 2023-09-08: qty 4

## 2023-09-08 MED ORDER — SODIUM CHLORIDE 0.9 % IR SOLN
3000.0000 mL | Status: DC
  Administered 2023-09-08 (×7): 3000 mL

## 2023-09-08 MED ORDER — FREE WATER
145.0000 mL | Status: DC
  Administered 2023-09-08 – 2023-09-12 (×21): 145 mL

## 2023-09-08 MED ORDER — ALBUTEROL SULFATE (2.5 MG/3ML) 0.083% IN NEBU: 2.5000 mg | INHALATION_SOLUTION | RESPIRATORY_TRACT | Status: DC | PRN

## 2023-09-08 MED ORDER — ESCITALOPRAM OXALATE 10 MG PO TABS
5.0000 mg | ORAL_TABLET | Freq: Every day | ORAL | Status: DC
  Administered 2023-09-08 – 2023-09-12 (×5): 5 mg
  Filled 2023-09-08 (×5): qty 0.5

## 2023-09-08 MED ORDER — LACTATED RINGERS IV SOLN: INTRAVENOUS | Status: DC

## 2023-09-08 MED ORDER — SODIUM CHLORIDE 0.9 % IR SOLN
3000.0000 mL | Status: DC
  Administered 2023-09-08 – 2023-09-10 (×9): 3000 mL

## 2023-09-08 MED ORDER — ESCITALOPRAM OXALATE 10 MG PO TABS
5.0000 mg | ORAL_TABLET | Freq: Every day | ORAL | Status: DC
  Filled 2023-09-08: qty 0.5

## 2023-09-08 MED ORDER — BETHANECHOL CHLORIDE 10 MG PO TABS
10.0000 mg | ORAL_TABLET | Freq: Three times a day (TID) | ORAL | Status: DC
  Administered 2023-09-08 – 2023-09-09 (×4): 10 mg
  Filled 2023-09-08 (×4): qty 1

## 2023-09-08 MED ORDER — LORATADINE 10 MG PO TABS
10.0000 mg | ORAL_TABLET | Freq: Every day | ORAL | Status: DC
  Administered 2023-09-08: 10 mg

## 2023-09-08 MED ORDER — CARMEX CLASSIC LIP BALM EX OINT
TOPICAL_OINTMENT | CUTANEOUS | Status: DC | PRN
  Filled 2023-09-08: qty 10

## 2023-09-08 MED ORDER — ACETAMINOPHEN 650 MG RE SUPP: 650.0000 mg | Freq: Four times a day (QID) | RECTAL | Status: DC | PRN

## 2023-09-08 MED ORDER — HYDROMORPHONE HCL 2 MG PO TABS
1.0000 mg | ORAL_TABLET | Freq: Three times a day (TID) | ORAL | Status: DC | PRN
  Administered 2023-09-08 – 2023-09-12 (×5): 1 mg
  Filled 2023-09-08 (×5): qty 1

## 2023-09-08 MED ORDER — LEVOCETIRIZINE DIHYDROCHLORIDE 5 MG PO TABS: 5.0000 mg | ORAL_TABLET | Freq: Every evening | ORAL | Status: DC

## 2023-09-08 MED ORDER — PROSOURCE TF20 ENFIT COMPATIBL EN LIQD
60.0000 mL | Freq: Every day | ENTERAL | Status: DC
  Administered 2023-09-08 – 2023-09-12 (×5): 60 mL
  Filled 2023-09-08 (×5): qty 60

## 2023-09-08 MED ORDER — IOHEXOL 300 MG/ML  SOLN
100.0000 mL | Freq: Once | INTRAMUSCULAR | Status: AC | PRN
  Administered 2023-09-08: 100 mL via INTRAVENOUS

## 2023-09-08 MED ORDER — ACETAMINOPHEN 325 MG PO TABS
650.0000 mg | ORAL_TABLET | Freq: Four times a day (QID) | ORAL | Status: DC | PRN
  Administered 2023-09-12: 650 mg
  Filled 2023-09-08: qty 2

## 2023-09-08 MED ORDER — FENTANYL 25 MCG/HR TD PT72
1.0000 | MEDICATED_PATCH | TRANSDERMAL | Status: DC
  Administered 2023-09-08 – 2023-09-11 (×2): 1 via TRANSDERMAL
  Filled 2023-09-08 (×2): qty 1

## 2023-09-08 MED ORDER — LORATADINE 10 MG PO TABS
10.0000 mg | ORAL_TABLET | Freq: Every day | ORAL | Status: DC
  Filled 2023-09-08: qty 1

## 2023-09-08 MED ORDER — ORAL CARE MOUTH RINSE
15.0000 mL | OROMUCOSAL | Status: DC
  Administered 2023-09-08 – 2023-09-11 (×12): 15 mL via OROMUCOSAL

## 2023-09-08 MED ORDER — LORAZEPAM 0.5 MG PO TABS
0.5000 mg | ORAL_TABLET | Freq: Two times a day (BID) | ORAL | Status: DC | PRN
  Administered 2023-09-10: 0.5 mg
  Filled 2023-09-08: qty 1

## 2023-09-08 MED ORDER — OSMOLITE 1.5 CAL PO LIQD
1000.0000 mL | ORAL | Status: DC
  Administered 2023-09-08 – 2023-09-12 (×5): 1000 mL
  Filled 2023-09-08 (×6): qty 1000

## 2023-09-08 NOTE — Progress Notes (Signed)
Initial Nutrition Assessment  DOCUMENTATION CODES:   Not applicable  INTERVENTION:  Initiate tube feeding via PEG tube: Osmolite 1.5 at 50 ml/h (1200 ml per day) free water flushes q4h  Prosource TF20 60 ml once  Provides 1880 kcal, 95 gm protein, 1784 ml free water daily (TF + FWF)  NUTRITION DIAGNOSIS:   Inadequate oral intake related to chronic illness as evidenced by NPO status.  GOAL:   Patient will meet greater than or equal to 90% of their needs   MONITOR:   Labs, Weight trends, TF tolerance  REASON FOR ASSESSMENT:   Consult, Ventilator Enteral/tube feeding initiation and management  ASSESSMENT:   Pt admitted from [facility] with gross hematuria. PMH significant for chronic hypoxic respiratory failure, chronically vent dependent, s/p trach and PEG, depression, generalized anxiety disorder, allergic rhinitis, chronic leukocytosis.  Urology following. Plans for conservative management of hematuria and clot retention for now.   Pt remains on chronic vent support via trach.  Pt last assessed by [facility] RD 05/2022. At that time pt was on Osmolite 1.5   No documented TF regimen via home meds. Will place order for TF initiation and adjust as appropriate throughout admission.   No weight history on file to review over the last year. Pt's weight on  06/19/22 was documented to be 82.7 kg Current weight: 90.9 kg  Generalized edema documented per nursing  Medications reviewed  Labs: sodium 133, Cr 0.30  NUTRITION - FOCUSED PHYSICAL EXAM: RD working remotely. Deferred to follow up.   Diet Order:   Diet Order             Diet NPO time specified  Diet effective now                   EDUCATION NEEDS:   No education needs have been identified at this time  Skin:  Skin Assessment: Reviewed RN Assessment  Last BM:  11/16 type 7 medium  Height:   Ht Readings from Last 1 Encounters:  09/08/23 5\' 8"  (1.727 m)    Weight:   Wt Readings from  Last 1 Encounters:  09/08/23 90.9 kg   BMI:  Body mass index is 30.47 kg/m.  Estimated Nutritional Needs:   Kcal:  1800-2000  Protein:  90-105g  Fluid:  >/=1.8L  Drusilla Kanner, RDN, LDN Clinical Nutrition

## 2023-09-08 NOTE — ED Notes (Signed)
ED TO INPATIENT HANDOFF REPORT  ED Nurse Name and Phone #: Rebecca Eaton RN  S Name/Age/Gender Andrew Rivera 72 y.o. male Room/Bed: RESB/RESB  Code Status   Code Status: Full Code  Home/SNF/Other Home Patient oriented to: self, place, time, and situation Is this baseline? Yes   Triage Complete: Triage complete  Chief Complaint Gross hematuria [R31.0]  Triage Note [facility] with complaint of two saturated bloody briefs, with clots from penis, started today. Pt has no prior urological hx. A&Ox4, pt is nonverbal but able to make needs known by mouthing words. Denies pain or fever.   Allergies Allergies  Allergen Reactions   Chlorhexidine Itching and Rash   Codeine Nausea Only   Oxycodone-Acetaminophen Nausea Only    Other reaction(s): Hallucinations    Level of Care/Admitting Diagnosis ED Disposition     ED Disposition  Admit   Condition  --   Comment  Hospital Area: [facility] [[id number]]  Level of Care: Stepdown [14]  Admit to SDU based on following criteria: Severe physiological/psychological symptoms:  Any diagnosis requiring assessment & intervention at least every 4 hours on an ongoing basis to obtain desired patient outcomes including stability and rehabilitation  May admit patient to [facility] or [facility] if equivalent level of care is available:: No  Covid Evaluation: Asymptomatic - no recent exposure (last 10 days) testing not required  Diagnosis: Gross hematuria [599.71.ICD-9-CM]  Admitting Physician: [name] Fava [[id number]]  Attending Physician: [name] Fava [[id number]]  Certification:: I certify this patient will need inpatient services for at least 2 midnights  Expected Medical Readiness: 09/10/2023          B Medical/Surgery History Past Medical History:  Diagnosis Date   Acute on chronic respiratory failure with hypoxia (HCC)    Altered mental status, unspecified    Chronic pain syndrome     Critical illness myopathy    Internal jugular (IJ) vein thromboembolism, acute, unspecified laterality (HCC)    Septic shock (HCC)    Past Surgical History:  Procedure Laterality Date   IR REPLC GASTRO/COLONIC TUBE PERCUT W/FLUORO  07/05/2021     A IV Location/Drains/Wounds Patient Lines/Drains/Airways Status     Active Line/Drains/Airways     Name Placement date Placement time Site Days   Peripheral IV 10/07/22 20 G 1.16" Anterior;Right Forearm 10/07/22  1439  Forearm  336   Peripheral IV 09/07/23 20 G Right Forearm 09/07/23  2300  Forearm  1   Gastrostomy/Enterostomy Percutaneous endoscopic gastrostomy (PEG) LLQ 04/25/21  0700  LLQ  866   Gastrostomy/Enterostomy Gastrostomy 18 Fr. LUQ 07/05/21  1520  LUQ  795   Urethral Catheter Rebecca Eaton RN Triple-lumen 16 Fr. 09/08/23  0215  Triple-lumen  less than 1   Fecal Management System 30 mL 06/20/22  1115  -- 445   Tracheostomy Shiley XLT Distal 6 mm Cuffed 06/05/21  1421  6 mm  825   Tracheostomy Shiley XLT Distal 6 mm Cuffed 06/15/22  2000  6 mm  450   Tracheostomy Shiley XLT Distal 6 mm Cuffed 10/07/22  --  6 mm  336   Tracheostomy 09/07/23  2200  --  1   Pressure Injury 01/27/20 Buttocks Right Stage 2 -  Partial thickness loss of dermis presenting as a shallow open injury with a red, pink wound bed without slough. 01/27/20  0115  -- 1320   Pressure Injury 01/27/20 Back Left Deep Tissue Pressure Injury - Purple or maroon  localized area of discolored intact skin or blood-filled blister due to damage of underlying soft tissue from pressure and/or shear. 01/27/20  0115  -- 1320   Pressure Injury 06/16/22 Sacrum Mid Stage 1 -  Intact skin with non-blanchable redness of a localized area usually over a bony prominence. 06/16/22  0130  -- 449   Pressure Injury 06/16/22 Buttocks Left Stage 2 -  Partial thickness loss of dermis presenting as a shallow open injury with a red, pink wound bed without slough. 06/16/22  0130  -- 449   Wound /  Incision (Open or Dehisced) 06/02/21 Other (Comment) Toe (Comment  which one) Anterior;Right Scabbed area with some pustulant drainage 06/02/21  0800  Toe (Comment  which one)  828            Intake/Output Last 24 hours No intake or output data in the 24 hours ending 09/08/23 1610  Labs/Imaging Results for orders placed or performed during the hospital encounter of 09/07/23 (from the past 48 hour(s))  Type and screen [facility]     Status: None   Collection Time: 09/07/23 10:50 PM  Result Value Ref Range   ABO/RH(D) O NEG    Antibody Screen NEG    Sample Expiration      09/10/2023,2359 Performed at [facility], [address] [place], [place] [address]   Comprehensive metabolic panel     Status: Abnormal   Collection Time: 09/07/23 11:00 PM  Result Value Ref Range   Sodium 137 135 - 145 mmol/L   Potassium 3.9 3.5 - 5.1 mmol/L   Chloride 101 98 - 111 mmol/L   CO2 29 22 - 32 mmol/L   Glucose, Bld 111 (H) 70 - 99 mg/dL    Comment: Glucose reference range applies only to samples taken after fasting for at least 8 hours.   BUN 25 (H) 8 - 23 mg/dL   Creatinine, Ser <4.09 (L) 0.61 - 1.24 mg/dL   Calcium 8.4 (L) 8.9 - 10.3 mg/dL   Total Protein 7.1 6.5 - 8.1 g/dL   Albumin 2.9 (L) 3.5 - 5.0 g/dL   AST 22 15 - 41 U/L   ALT 20 0 - 44 U/L   Alkaline Phosphatase 104 38 - 126 U/L   Total Bilirubin 0.4 <1.2 mg/dL   GFR, Estimated NOT CALCULATED >60 mL/min    Comment: (NOTE) Calculated using the CKD-EPI Creatinine Equation (2021)    Anion gap 7 5 - 15    Comment: Performed at [facility], [address] [place], [place] [address]  CBC with Differential     Status: Abnormal   Collection Time: 09/07/23 11:00 PM  Result Value Ref Range   WBC 13.8 (H) 4.0 - 10.5 K/uL   RBC 4.02 (L) 4.22 - 5.81 MIL/uL   Hemoglobin 12.6 (L) 13.0 - 17.0 g/dL   HCT 47.8 (L) 29.5 - 62.1 %   MCV 93.0 80.0 - 100.0 fL   MCH 31.3 26.0 - 34.0 pg    MCHC 33.7 30.0 - 36.0 g/dL   RDW 30.8 65.7 - 84.6 %   Platelets 332 150 - 400 K/uL   nRBC 0.0 0.0 - 0.2 %   Neutrophils Relative % 73 %   Neutro Abs 9.9 (H) 1.7 - 7.7 K/uL   Lymphocytes Relative 15 %   Lymphs Abs 2.1 0.7 - 4.0 K/uL   Monocytes Relative 8 %   Monocytes Absolute 1.1 (H) 0.1 - 1.0 K/uL  Eosinophils Relative 4 %   Eosinophils Absolute 0.6 (H) 0.0 - 0.5 K/uL   Basophils Relative 0 %   Basophils Absolute 0.1 0.0 - 0.1 K/uL   Immature Granulocytes 0 %   Abs Immature Granulocytes 0.05 0.00 - 0.07 K/uL    Comment: Performed at [facility], [address], [place], [place] [address]  Protime-INR     Status: None   Collection Time: 09/07/23 11:00 PM  Result Value Ref Range   Prothrombin Time 13.8 11.4 - 15.2 seconds   INR 1.0 0.8 - 1.2    Comment: (NOTE) INR goal varies based on device and disease states. Performed at [facility], [address], [place], [place] [address]    CT ABDOMEN PELVIS W CONTRAST  Result Date: 09/08/2023 CLINICAL DATA:  Hematuria. EXAM: CT ABDOMEN AND PELVIS WITH CONTRAST TECHNIQUE: Multidetector CT imaging of the abdomen and pelvis was performed using the standard protocol following bolus administration of intravenous contrast. RADIATION DOSE REDUCTION: This exam was performed according to the departmental dose-optimization program which includes automated exposure control, adjustment of the mA and/or kV according to patient size and/or use of iterative reconstruction technique. CONTRAST:  OMNIPAQUE IOHEXOL 300 MG/ML  SOLN COMPARISON:  CT abdomen pelvis dated 10/07/2022. FINDINGS: Lower chest: Partially visualized bibasilar subpleural consolidative changes with air bronchogram also present on the prior CT and may represent chronic atelectasis or pneumonia. Clinical correlation is recommended. No intra-abdominal free air or free fluid. Hepatobiliary: Multiple subcentimeter hepatic hypodense lesions are too  small to characterize. No biliary ductal dilatation. The gallbladder is unremarkable. Pancreas: Unremarkable. No pancreatic ductal dilatation or surrounding inflammatory changes. Spleen: Normal in size without focal abnormality. Adrenals/Urinary Tract: The adrenal glands unremarkable. There is no hydronephrosis on either side. There is symmetric enhancement and excretion of contrast by both kidneys. The visualized ureters appear unremarkable. There is trabeculated and irregular appearance of the bladder wall, likely related to chronic bladder outlet obstruction. Multiple bladder diverticula noted. Large amount of high attenuating content within the bladder consistent with blood product/clot. Underlying bladder mass is not excluded but cannot be evaluated due to presence of blood clot. Further evaluation with cystoscopy is recommended. 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 There is loose stool throughout the colon consistent with diarrheal state. There is sigmoid diverticulosis without active inflammatory changes. There is no bowel obstruction or active inflammation. The appendix is not visualized with certainty. No inflammatory changes identified in the right lower quadrant. Vascular/Lymphatic: Moderate aortoiliac atherosclerotic disease. The IVC is unremarkable. No portal venous gas. There is no adenopathy. Reproductive: The prostate and seminal vesicles are grossly unremarkable. No pelvic mass. Other: None Musculoskeletal: Osteopenia with severe degenerative changes and lower lumbar laminectomy. No acute osseous pathology. There is fatty atrophy of the paraspinal, abdominal, and pelvic musculature. IMPRESSION: 1. Large amount of blood product/clot within the bladder. Further evaluation with cystoscopy is recommended. 2. No hydronephrosis or nephrolithiasis. 3. Sigmoid diverticulosis. No bowel obstruction. 4. 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 5. Chronic bibasilar subpleural atelectasis or infiltrate. 6.  Aortic Atherosclerosis (ICD10-I70.0). Electronically Signed   By: Elgie Collard M.D.   On: 09/08/2023 00:31    Pending Labs Unresulted Labs (From admission, onward)     Start     Ordered   09/08/23 0900  Hemoglobin and hematocrit, blood  Once-Timed,   TIMED        09/08/23 0216   09/08/23 0500  CBC with Differential/Platelet  Tomorrow morning,   R        09/08/23 0215   09/08/23 0500  Comprehensive metabolic panel  Tomorrow morning,   R        09/08/23 0215   09/08/23 0500  Magnesium  Tomorrow morning,   R        09/08/23 0215   09/08/23 0216  Magnesium  Add-on,   AD        09/08/23 0215   09/08/23 0100  ABO/Rh  Once,   R        09/08/23 0100   09/07/23 2249  Urinalysis, Routine w reflex microscopic -Urine, Clean Catch  Once,   URGENT       Question:  Specimen Source  Answer:  Urine, Clean Catch   09/07/23 2249            Vitals/Pain Today's Vitals   09/07/23 2300 09/07/23 2330 09/08/23 0256 09/08/23 0305  BP: 137/66 132/67    Pulse: 100     Resp: 16     Temp:   98.1 F (36.7 C)   TempSrc:   Oral   SpO2: 96%   98%  PainSc:        Isolation Precautions No active isolations  Medications Medications  acetaminophen (TYLENOL) tablet 650 mg (has no administration in time range)    Or  acetaminophen (TYLENOL) suppository 650 mg (has no administration in time range)  ondansetron (ZOFRAN) injection 4 mg (has no administration in time range)  lactated ringers infusion (has no administration in time range)  iohexol (OMNIPAQUE) 300 MG/ML solution 100 mL (100 mLs Intravenous Contrast Given 09/08/23 0014)    Mobility non-ambulatory     Focused Assessments     R Recommendations: See Admitting Provider Note  Report given to:   Additional Notes:

## 2023-09-08 NOTE — Plan of Care (Signed)
  Problem: Education: Goal: Knowledge of General Education information will improve Description: Including pain rating scale, medication(s)/side effects and non-pharmacologic comfort measures Outcome: Not Progressing   

## 2023-09-08 NOTE — ED Provider Notes (Signed)
  Physical Exam  BP 132/67   Pulse 100   Temp 98.1 F (36.7 C) (Oral)   Resp 16   SpO2 98%   Physical Exam Constitutional:      General: He is not in acute distress.    Appearance: Normal appearance.  HENT:     Head: Normocephalic and atraumatic.     Nose: No congestion or rhinorrhea.  Eyes:     General:        Right eye: No discharge.        Left eye: No discharge.     Extraocular Movements: Extraocular movements intact.     Pupils: Pupils are equal, round, and reactive to light.  Cardiovascular:     Rate and Rhythm: Normal rate and regular rhythm.     Heart sounds: No murmur heard. Pulmonary:     Effort: No respiratory distress.     Breath sounds: No wheezing or rales.     Comments: Trach in place Abdominal:     General: There is no distension.     Tenderness: There is abdominal tenderness.  Musculoskeletal:        General: Normal range of motion.     Cervical back: Normal range of motion.  Skin:    General: Skin is warm and dry.  Neurological:     General: No focal deficit present.     Mental Status: He is alert.     Procedures  Procedures  ED Course / MDM    Medical Decision Making Amount and/or Complexity of Data Reviewed Labs: ordered. Radiology: ordered.  Risk Prescription drug management. Decision regarding hospitalization.  Patient received in handoff.  Gross hematuria pending completion of CT imaging.  CT imaging showing significant amount of blood product and blood clots in the bladder.  No associated hydronephrosis.  Spoke with urology Dr. Margo Aye who is recommending three-way Foley catheter placement and initiation of irrigation and possible CBI.  Urology will evaluate the patient in the morning.  Patient admitted       Glendora Score, MD 09/08/23 [phone]

## 2023-09-08 NOTE — Progress Notes (Signed)
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  Patient is not on anticoagulation or antiplatelet.  No recent trauma.  No recent Foley.  In ED, vital stable.  Hgb 12.6 (baseline).  WBC 13.8 (baseline).  CMP without significant finding.  PT/INR within normal.  CT abdomen and pelvis showed large amount of blood products/clot within bladder but no hydronephrosis or nephrolithiasis.  Urology consulted.  Foley catheter inserted and started on CBI.   Subjective: Seen and examined earlier this morning.  Foley clogged up earlier this morning.  Seen by urology and Foley catheter exchanged.  Clots removed.  Urine light pink after clot removal.  Patient has no complaints.  Objective: Vitals:   09/08/23 0700 09/08/23 0800 09/08/23 0900 09/08/23 1000  BP: (!) 164/44 (!) 173/53 (!) 146/54 (!) 151/57  Pulse: 78 83 79 80  Resp: 16 16 16 16   Temp:  98 F (36.7 C)    TempSrc:  Axillary    SpO2: 100% 100% 97% 100%  Weight:      Height:        Examination:  GENERAL: No apparent distress.  Nontoxic. HEENT: MMM.  Vision and hearing grossly intact.  NECK: Tracheostomy.  On vent via trach. RESP:  No IWOB.  Fair aeration bilaterally. CVS:  RRR. Heart sounds normal.  ABD/GI/GU: BS+. Abd soft, NTND.  MSK/EXT: Quadriplegia.  No apparent deformity. SKIN: no apparent skin lesion or wound NEURO: Awake, alert and oriented appropriately.  Aphonia.  Quadriplegia. PSYCH: Calm. Normal affect.   Procedures:  None  Microbiology summarized: MRSA PCR screen  nonreactive.  Assessment and plan: Gross hematuria: Unclear etiology.  No known history of bladder cancer, recent catheterization, anticoagulation or antiplatelet use.  CT showed blood products/clots in bladder. H&H stable.  Coag labs negative.  Low suspicion for infection. 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  Normocytic anemia: Stable -Continue monitoring  Quadriplegia-noted.  Anxiety and depression: Stable -Continue home meds  Chronic leukocytosis: Mild and stable.  Low suspicion for infection.  Allergic rhinitis -Continue antihistamine  Hyponatremia: Mild -Continue monitoring  Chronic pain -Continue home medication  Elevated blood pressure: No history of hypertension -Discontinue IV fluids -IV labetalol as needed  Obesity Body mass index is 30.47 kg/m.          DVT prophylaxis:  SCDs Start: 09/08/23 0215  Code Status: Full code Family Communication: None at bedside Level of care: Stepdown Status is: Inpatient Remains inpatient appropriate because: Gross hematuria   Final disposition: Likely back to [facility] when medically stable Consultants:  Urology  55 minutes with more than 50% spent in reviewing records, counseling patient/family and coordinating care.   Sch Meds:  Scheduled Meds:  bethanechol  10 mg Per Tube TID   chlorhexidine  5 mL Mouth Rinse 2 times per day   escitalopram  5 mg Per Tube Daily   fentaNYL  1 patch  Transdermal Q72H   loratadine  10 mg Oral q1800   mouth rinse  15 mL Mouth Rinse Q4H   Continuous Infusions:  sodium chloride irrigation     sodium chloride irrigation     PRN Meds:.acetaminophen **OR** acetaminophen, albuterol, HYDROmorphone, labetalol, lip balm, LORazepam, ondansetron (ZOFRAN)  IV  Antimicrobials: Anti-infectives (From admission, onward)    None        I have personally reviewed the following labs and images: CBC: Recent Labs  Lab 09/07/23 2300 09/08/23 0910  WBC 13.8* 14.1*  NEUTROABS 9.9* 12.0*  HGB 12.6* 12.5*  HCT 37.4* 38.2*  MCV 93.0 94.6  PLT 332 298   BMP &GFR Recent Labs  Lab 09/07/23 2300 09/08/23 0910  NA 137 133*  K 3.9 3.9  CL 101 100  CO2 29 25  GLUCOSE 111* 115*  BUN 25* 22  CREATININE <0.30* <0.30*  CALCIUM 8.4* 8.3*  MG  --  2.3   CrCl cannot be calculated (This lab value cannot be used to calculate CrCl because it is not a number: <0.30). Liver & Pancreas: Recent Labs  Lab 09/07/23 2300 09/08/23 0910  AST 22 17  ALT 20 20  ALKPHOS 104 100  BILITOT 0.4 0.5  PROT 7.1 7.1  ALBUMIN 2.9* 3.0*   No results for input(s): "LIPASE", "AMYLASE" in the last 168 hours. No results for input(s): "AMMONIA" in the last 168 hours. Diabetic: No results for input(s): "HGBA1C" in the last 72 hours. No results for input(s): "GLUCAP" in the last 168 hours. Cardiac Enzymes: No results for input(s): "CKTOTAL", "CKMB", "CKMBINDEX", "TROPONINI" in the last 168 hours. No results for input(s): "PROBNP" in the last 8760 hours. Coagulation Profile: Recent Labs  Lab 09/07/23 2300  INR 1.0   Thyroid Function Tests: No results for input(s): "TSH", "T4TOTAL", "FREET4", "T3FREE", "THYROIDAB" in the last 72 hours. Lipid Profile: No results for input(s): "CHOL", "HDL", "LDLCALC", "TRIG", "CHOLHDL", "LDLDIRECT" in the last 72 hours. Anemia Panel: No results for input(s): "VITAMINB12", "FOLATE", "FERRITIN", "TIBC", "IRON", "RETICCTPCT" in the last 72 hours. Urine analysis:    Component Value Date/Time   COLORURINE YELLOW 10/07/2022 1349   APPEARANCEUR CLOUDY (A) 10/07/2022 1349   LABSPEC 1.014 10/07/2022 1349   PHURINE 5.0 10/07/2022 1349   GLUCOSEU NEGATIVE 10/07/2022 1349   HGBUR NEGATIVE 10/07/2022 1349   BILIRUBINUR NEGATIVE  10/07/2022 1349   KETONESUR NEGATIVE 10/07/2022 1349   PROTEINUR NEGATIVE 10/07/2022 1349   NITRITE POSITIVE (A) 10/07/2022 1349   LEUKOCYTESUR LARGE (A) 10/07/2022 1349   Sepsis Labs: Invalid input(s): "PROCALCITONIN", "LACTICIDVEN"  Microbiology: Recent Results (from the past 240 hour(s))  MRSA Next Gen by PCR, Nasal     Status: None   Collection Time: 09/08/23  5:20 AM   Specimen: Nasal Mucosa; Nasal Swab  Result Value Ref Range Status   MRSA by PCR Next Gen NOT DETECTED NOT DETECTED Final    Comment: (NOTE) The GeneXpert MRSA Assay (FDA approved for NASAL specimens only), is one component of a comprehensive MRSA colonization surveillance program. It is not intended to diagnose MRSA infection nor to guide or monitor treatment for MRSA infections. Test performance is not FDA approved in patients less than 52 years old. Performed at [facility], [address], [place], [place] [address]     Radiology Studies: CT ABDOMEN PELVIS W CONTRAST  Result Date: 09/08/2023 CLINICAL DATA:  Hematuria. EXAM: CT ABDOMEN AND PELVIS WITH CONTRAST TECHNIQUE: Multidetector CT imaging of the abdomen and pelvis was performed using the  standard protocol following bolus administration of intravenous contrast. RADIATION DOSE REDUCTION: This exam was performed according to the departmental dose-optimization program which includes automated exposure control, adjustment of the mA and/or kV according to patient size and/or use of iterative reconstruction technique. CONTRAST:  OMNIPAQUE IOHEXOL 300 MG/ML  SOLN COMPARISON:  CT abdomen pelvis dated 10/07/2022. FINDINGS: Lower chest: Partially visualized bibasilar subpleural consolidative changes with air bronchogram also present on the prior CT and may represent chronic atelectasis or pneumonia. Clinical correlation is recommended. No intra-abdominal free air or free fluid. Hepatobiliary: Multiple subcentimeter hepatic hypodense lesions  are too small to characterize. No biliary ductal dilatation. The gallbladder is unremarkable. Pancreas: Unremarkable. No pancreatic ductal dilatation or surrounding inflammatory changes. Spleen: Normal in size without focal abnormality. Adrenals/Urinary Tract: The adrenal glands unremarkable. There is no hydronephrosis on either side. There is symmetric enhancement and excretion of contrast by both kidneys. The visualized ureters appear unremarkable. There is trabeculated and irregular appearance of the bladder wall, likely related to chronic bladder outlet obstruction. Multiple bladder diverticula noted. Large amount of high attenuating content within the bladder consistent with blood product/clot. Underlying bladder mass is not excluded but cannot be evaluated due to presence of blood clot. Further evaluation with cystoscopy is recommended. 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 There is loose stool throughout the colon consistent with diarrheal state. There is sigmoid diverticulosis without active inflammatory changes. There is no bowel obstruction or active inflammation. The appendix is not visualized with certainty. No inflammatory changes identified in the right lower quadrant. Vascular/Lymphatic: Moderate aortoiliac atherosclerotic disease. The IVC is unremarkable. No portal venous gas. There is no adenopathy. Reproductive: The prostate and seminal vesicles are grossly unremarkable. No pelvic mass. Other: None Musculoskeletal: Osteopenia with severe degenerative changes and lower lumbar laminectomy. No acute osseous pathology. There is fatty atrophy of the paraspinal, abdominal, and pelvic musculature. IMPRESSION: 1. Large amount of blood product/clot within the bladder. Further evaluation with cystoscopy is recommended. 2. No hydronephrosis or nephrolithiasis. 3. Sigmoid diverticulosis. No bowel obstruction. 4. 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 5. Chronic bibasilar subpleural atelectasis or infiltrate. 6.  Aortic Atherosclerosis (ICD10-I70.0). Electronically Signed   By: Elgie Collard M.D.   On: 09/08/2023 00:31      Andrew Rivera [facility]  If 7PM-7AM, please contact night-coverage [email or web address] 09/08/2023, 10:51 AM

## 2023-09-08 NOTE — Progress Notes (Signed)
Patient transported to ICU without any complications

## 2023-09-08 NOTE — Progress Notes (Signed)
Patient transported to CT without any complications.

## 2023-09-08 NOTE — ED Notes (Signed)
16 FR. Triple lumen foley catheter placed, bright red bloody urine returned after insertion. Nurse irrigated with 250cc of NS. Pt passed large blood clot around foley catheter. Denies pain or pressure during procedure. CBI initiated per MD order.

## 2023-09-08 NOTE — Progress Notes (Signed)
Pt from ER via stretcher with monitors  Wet frpm leaking around F-cath, contineuous bladder irrigation going, inconrinent loose stool, apploed rectal pouch to protect skin. Bilat. Bottocks abrasion from [facility], and abrasion and red  on Lt behind armpit to back,

## 2023-09-08 NOTE — H&P (Signed)
History and Physical      Andrew Rivera:[id number] DOB: Apr 17, 1951 DOA: 09/07/2023; DOS: 09/08/2023  PCP: Michail Sermon, MD  Patient coming from: home   I have personally briefly reviewed patient's old medical records in [facility]  Chief Complaint: Gross hematuria  HPI: Andrew Rivera is a 72 y.o. male with medical history significant for chronic hypoxic respiratory failure, chronically vent dependent, status post trach and PEG, depression, generalized anxiety disorder, allergic rhinitis, chronic leukocytosis, who is admitted to [facility] on 09/07/2023 with presenting  gross hematuria after presenting from [facility] to [facility] ED complaining of gross hematuria.   The patient presents with 1 day of new onset gross hematuria, with associated passage of clots in his urine.  He is not on any blood thinners as an outpatient, including no aspirin.  No recent preceding trauma.  Not associate with any report of any abdominal discomfort.  Denies any associated subjective fever, chills or rigors, generalized myalgias.  No chest pain or shortness of breath.  He does not have a chronic Foley.  His medical history is notable for chronic hypoxic respiratory failure for which she is chronically vent dependent.  He has trach and PEG in place.     ED Course:  Vital signs in the ED were notable for the following: Afebrile; heart rates in the low 100s; systolic blood pressures in the 130s; respiratory rate 16, oxygen saturation 96 to 98%.   Labs were notable for the following: CMP notable for the following: Creatinine less than 0.30, calcium adjusted for mild hypoalbuminemia noted to be 9.2, albumin 2.4, liver enzymes were otherwise within normal limits.  CBC notable for white blood cell count 13,800 compared to most recent prior value of 13,500 on 10/07/2022, hemoglobin 12.6 associated Neuraceq/recurrent properties as well as nonelevated RDW and relative demonstrates a prior hemoglobin value of  12.4 on 10/07/2022, with a count 332.  Urinalysis ordered, Therazole currently pending.  INR 1.0.  Per my interpretation, EKG in ED demonstrated the following: Sinus rhythm with heart rate 88, normal intervals, no evidence of T wave or ST changes, including no evidence of ST elevation.  Imaging in the ED, per corresponding formal radiology read, was notable for the following: CT abdomen/pelvis showed evidence suggestive of blood in the bladder, in the absence of any evidence of hydronephrosis nor any evidence of nephrolithiasis or ureterolithiasis.  Foley catheter was placed in the ED.   EDP discussed patient's case with on-call urology, Dr. Margo Aye, who will formally consult and see the patient in the morning.   While in the ED, the following were administered: None  Subsequently, the patient was admitted for further evaluation management of presenting gross hematuria.    Review of Systems: As per HPI otherwise 10 point review of systems negative.   Past Medical History:  Diagnosis Date   Acute on chronic respiratory failure with hypoxia (HCC)    Altered mental status, unspecified    Chronic pain syndrome    Critical illness myopathy    Internal jugular (IJ) vein thromboembolism, acute, unspecified laterality (HCC)    Septic shock [facility])     Past Surgical History:  Procedure Laterality Date   IR [facility] GASTRO/COLONIC TUBE PERCUT W/FLUORO  07/05/2021    Social History:  reports that he quit smoking about 33 years ago. His smoking use included cigarettes. He has never used smokeless tobacco. He reports that he does not currently use alcohol. He reports that he does not use  drugs.   Allergies  Allergen Reactions   Chlorhexidine Itching and Rash   Codeine Nausea Only   Oxycodone-Acetaminophen Nausea Only    Other reaction(s): Hallucinations    History reviewed. No pertinent family history.  Family history reviewed and not pertinent    Prior to Admission medications    Medication Sig Start Date End Date Taking? Authorizing Provider  acetaminophen (TYLENOL) 325 MG tablet Take 975 mg by mouth in the morning and at bedtime.   Yes [provider]  bethanechol (URECHOLINE) 10 MG tablet Place 10 mg into feeding tube 3 (three) times daily.   Yes [provider]  Carboxymethylcellulose Sodium 1 % GEL Place 1 drop into both eyes in the morning, at noon, and at bedtime.   Yes [provider]  escitalopram (LEXAPRO) 5 MG tablet Take 5 mg by mouth daily. 08/18/23  Yes [provider]  fentaNYL (DURAGESIC) 25 MCG/HR Place 1 patch onto the skin every 3 (three) days. 06/09/21  Yes Russella Dar, NP  furosemide (LASIX) 40 MG tablet Take 40 mg by mouth daily. 08/26/23  Yes [provider]  HYDROmorphone (DILAUDID) 2 MG tablet Place 0.5 tablets (1 mg total) into feeding tube every 8 (eight) hours as needed for severe pain. 06/21/22  Yes Katsadouros, Vasilios, MD  hydrOXYzine (ATARAX/VISTARIL) 25 MG tablet Place 25 mg into feeding tube 3 (three) times daily.   Yes [provider]  ipratropium-albuterol (DUONEB) 0.5-2.5 (3) MG/3ML SOLN Take 3 mLs by nebulization every 4 (four) hours as needed (respiratory failure).   Yes [provider]  levocetirizine (XYZAL) 5 MG tablet Place 5 mg into feeding tube every evening.   Yes [provider]  LORazepam (ATIVAN) 0.5 MG tablet Place 1 tablet (0.5 mg total) into feeding tube every 12 (twelve) hours as needed for anxiety. 06/21/22  Yes Katsadouros, Vasilios, MD  Melatonin 10 MG TABS Give 10 mg by tube at bedtime.   Yes [provider]  Menthol-Zinc Oxide (CALMOSEPTINE) 0.44-20.6 % OINT Apply 1 application topically See admin instructions. Apply thin layer of calmoseptine to sacral area, between brief changes for redness   Yes [provider]  Neomy-Bacit-Polymyx-Pramoxine (TRIPLE ANTIBIOTIC PLUS) 1 % OINT Apply 1 application topically See admin  instructions. Apply thin layer to right great toe until healed leave open to air.   Yes [provider]  ondansetron (ZOFRAN-ODT) 4 MG disintegrating tablet Take 4 mg by mouth every 8 (eight) hours as needed for nausea or vomiting.   Yes [provider]  polyethylene glycol (MIRALAX / GLYCOLAX) 17 g packet Place 17 g into feeding tube 2 (two) times daily. 06/09/21  Yes Russella Dar, NP  Potassium Chloride 10 % SOLN 20 mEq daily. 09/03/23  Yes [provider]  pregabalin (LYRICA) 100 MG capsule Place 1 capsule (100 mg total) into feeding tube 3 (three) times daily. 06/21/22  Yes Katsadouros, Vasilios, MD  lansoprazole (PREVACID) 30 MG capsule Place 30 mg into feeding tube daily at 12 noon. Patient not taking: Reported on 09/08/2023    [provider]  Mouthwashes (MOUTH RINSE) LIQD solution 15 mLs by Mouth Rinse route in the morning and at bedtime. Patient not taking: Reported on 07/05/2021 06/30/21   Eliezer Champagne, NP  mupirocin ointment (BACTROBAN) 2 % Place 1 application into the nose 2 (two) times daily. Patient not taking: Reported on 07/05/2021 06/30/21   Eliezer Champagne, NP  pantoprazole (PROTONIX) 40 MG injection Inject 40 mg into the vein  daily. Patient not taking: Reported on 09/08/2023 08/22/23   [provider]  Protein (FEEDING SUPPLEMENT, PROSOURCE TF20,) liquid Place 60 mLs into feeding tube daily. Patient not taking: Reported on 09/08/2023 06/21/22   Mapp, Gaylyn Cheers, MD  Water For Irrigation, Sterile (FREE WATER) SOLN Place 150 mLs into feeding tube every 4 (four) hours. 06/21/22   Mapp, Gaylyn Cheers, MD  sertraline (ZOLOFT) 50 MG tablet Place 50 mg into feeding tube at bedtime.  04/28/21  [provider]     Objective    Physical Exam: Vitals:   09/07/23 2238 09/07/23 2240 09/07/23 2300 09/07/23 2330  BP:   137/66 132/67  Pulse:   100   Resp:   16   Temp:  98.3 F (36.8 C)    TempSrc:  Oral    SpO2: 98%  96%     General:  appears to be stated age; alert, oriented Skin: warm, dry, no rash Head:  AT/[place] Mouth:  Oral mucosa membranes appear moist, normal dentition Neck: Tracheostomy noted Heart:  RRR; did not appreciate any M/R/G Lungs: CTAB, did not appreciate any wheezes, rales, or rhonchi Abdomen: + BS; soft, ND, NT; PEG noted Vascular: 2+ pedal pulses b/l; 2+ radial pulses b/l Extremities: no peripheral edema, no muscle wasting Neuro: strength and sensation intact in upper and lower extremities b/l     Labs on Admission: I have personally reviewed following labs and imaging studies  CBC: Recent Labs  Lab 09/07/23 2300  WBC 13.8*  NEUTROABS 9.9*  HGB 12.6*  HCT 37.4*  MCV 93.0  PLT 332   Basic Metabolic Panel: Recent Labs  Lab 09/07/23 2300  NA 137  K 3.9  CL 101  CO2 29  GLUCOSE 111*  BUN 25*  CREATININE <0.30*  CALCIUM 8.4*   GFR: CrCl cannot be calculated (This lab value cannot be used to calculate CrCl because it is not a number: <0.30). Liver Function Tests: Recent Labs  Lab 09/07/23 2300  AST 22  ALT 20  ALKPHOS 104  BILITOT 0.4  PROT 7.1  ALBUMIN 2.9*   No results for input(s): "LIPASE", "AMYLASE" in the last 168 hours. No results for input(s): "AMMONIA" in the last 168 hours. Coagulation Profile: Recent Labs  Lab 09/07/23 2300  INR 1.0   Cardiac Enzymes: No results for input(s): "CKTOTAL", "CKMB", "CKMBINDEX", "TROPONINI" in the last 168 hours. BNP (last 3 results) No results for input(s): "PROBNP" in the last 8760 hours. HbA1C: No results for input(s): "HGBA1C" in the last 72 hours. CBG: No results for input(s): "GLUCAP" in the last 168 hours. Lipid Profile: No results for input(s): "CHOL", "HDL", "LDLCALC", "TRIG", "CHOLHDL", "LDLDIRECT" in the last 72 hours. Thyroid Function Tests: No results for input(s): "TSH", "T4TOTAL", "FREET4", "T3FREE", "THYROIDAB" in the last 72 hours. Anemia Panel: No results for input(s): "VITAMINB12", "FOLATE", "FERRITIN",  "TIBC", "IRON", "RETICCTPCT" in the last 72 hours. Urine analysis:    Component Value Date/Time   COLORURINE YELLOW 10/07/2022 1349   APPEARANCEUR CLOUDY (A) 10/07/2022 1349   LABSPEC 1.014 10/07/2022 1349   PHURINE 5.0 10/07/2022 1349   GLUCOSEU NEGATIVE 10/07/2022 1349   HGBUR NEGATIVE 10/07/2022 1349   BILIRUBINUR NEGATIVE 10/07/2022 1349   KETONESUR NEGATIVE 10/07/2022 1349   PROTEINUR NEGATIVE 10/07/2022 1349   NITRITE POSITIVE (A) 10/07/2022 1349   LEUKOCYTESUR LARGE (A) 10/07/2022 1349    Radiological Exams on Admission: CT ABDOMEN PELVIS W CONTRAST  Result Date: 09/08/2023 CLINICAL DATA:  Hematuria. EXAM: CT ABDOMEN AND PELVIS WITH CONTRAST TECHNIQUE: Multidetector  CT imaging of the abdomen and pelvis was performed using the standard protocol following bolus administration of intravenous contrast. RADIATION DOSE REDUCTION: This exam was performed according to the departmental dose-optimization program which includes automated exposure control, adjustment of the mA and/or kV according to patient size and/or use of iterative reconstruction technique. CONTRAST:  OMNIPAQUE IOHEXOL 300 MG/ML  SOLN COMPARISON:  CT abdomen pelvis dated 10/07/2022. FINDINGS: Lower chest: Partially visualized bibasilar subpleural consolidative changes with air bronchogram also present on the prior CT and may represent chronic atelectasis or pneumonia. Clinical correlation is recommended. No intra-abdominal free air or free fluid. Hepatobiliary: Multiple subcentimeter hepatic hypodense lesions are too small to characterize. No biliary ductal dilatation. The gallbladder is unremarkable. Pancreas: Unremarkable. No pancreatic ductal dilatation or surrounding inflammatory changes. Spleen: Normal in size without focal abnormality. Adrenals/Urinary Tract: The adrenal glands unremarkable. There is no hydronephrosis on either side. There is symmetric enhancement and excretion of contrast by both kidneys. The  visualized ureters appear unremarkable. There is trabeculated and irregular appearance of the bladder wall, likely related to chronic bladder outlet obstruction. Multiple bladder diverticula noted. Large amount of high attenuating content within the bladder consistent with blood product/clot. Underlying bladder mass is not excluded but cannot be evaluated due to presence of blood clot. Further evaluation with cystoscopy is recommended. 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 There is loose stool throughout the colon consistent with diarrheal state. There is sigmoid diverticulosis without active inflammatory changes. There is no bowel obstruction or active inflammation. The appendix is not visualized with certainty. No inflammatory changes identified in the right lower quadrant. Vascular/Lymphatic: Moderate aortoiliac atherosclerotic disease. The IVC is unremarkable. No portal venous gas. There is no adenopathy. Reproductive: The prostate and seminal vesicles are grossly unremarkable. No pelvic mass. Other: None Musculoskeletal: Osteopenia with severe degenerative changes and lower lumbar laminectomy. No acute osseous pathology. There is fatty atrophy of the paraspinal, abdominal, and pelvic musculature. IMPRESSION: 1. Large amount of blood product/clot within the bladder. Further evaluation with cystoscopy is recommended. 2. No hydronephrosis or nephrolithiasis. 3. Sigmoid diverticulosis. No bowel obstruction. 4. 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 5. Chronic bibasilar subpleural atelectasis or infiltrate. 6.  Aortic Atherosclerosis (ICD10-I70.0). Electronically Signed   By: Elgie Collard M.D.   On: 09/08/2023 00:31      Assessment/Plan    Principal Problem:   Gross hematuria Active Problems:   Chronic hypoxic respiratory failure (HCC)   Depression   GAD (generalized anxiety disorder)   Allergic rhinitis   Leukocytosis    #)  Gross hematuria: Presents with 1 day of new onset gross hematuria, in the absence of any report of recent preceding trauma and in the absence of any use of blood thinners as an outpatient.  Foley catheter placed in the emergency department this evening, with evidence of some urinary clots.  Appears hemodynamically stable, with hemoglobin within his baseline range of 12-13.  Will closely monitor ensuing hemoglobin trend as outlined below.  No indication for PRBC transfusion at this time.  EDP discussed patient's case with on-call urology, who will formally consult, as further detailed above.  Plan: CBI.  Urology to formally consult, as above.  Type and screen ordered.  Acute 4-hour H&H's ordered through 9 AM on 09/08/2023.  CBC in the morning.  Continuous lactated Ringer's at 100 cc/h x 12 hours.  Follow-up result urinalysis.  Monitor strict I's and O's and daily weights.  Monitor on telemetry.  Refraining from pharmacologic DVT prophylaxis.  SCDs.                #) Chronic hypoxic respiratory failure: Documented history of such for which the patient is chronically vent dependent, and resides at [facility] for management of this.  Presenting oxygen saturations in the high 90s on his baseline ventilator settings.   Plan: Continue baseline ventilator settings.  Prn albuterol nebulizers.  Check serum magnesium and phosphorus levels.                  #) Depression/GAD: documented h/o such. On Lexapro and as needed Ativan as outpatient.  Of note, presenting EKG shows no evidence of QTc prolongation.  Plan: Continue outpatient Lexapro per PEG tube and continue outpatient as needed Ativan per PEG.                  #) Allergic Rhinitis: documented h/o such, on scheduled Xyzal as outpatient.    Plan: cont outpatient Xyzal, per PEG.                   #) Chronic leukocytosis: Documented history of such, associated baseline low blood cell range of  12-17, presenting with a cell count consistent with this baseline range and on par with most recent prior white blood cell count, as further quantified above.  Per initial chart review, unclear etiology as to the chronic nature of his mildly cytosis.  He is afebrile.  In the absence of any underlying infectious process at this time, criteria are not met for sepsis.  Will follow-up result urinalysis.  Plan: Follow-up result urinalysis.  Pete CBC in the morning.       DVT prophylaxis: SCD's   Code Status: Full code Family Communication: none Disposition Plan: Per Rounding Team Consults called: EDP d/w on-call urology, Dr. Margo Aye, who will formally consult, as further detailed above;  Admission status: Inpatient    I SPENT GREATER THAN 75  MINUTES IN CLINICAL CARE TIME/MEDICAL DECISION-MAKING IN COMPLETING THIS ADMISSION.     Andrew Born Jaliah Foody DO [facility] From 7PM - 7AM   09/08/2023, 2:17 AM

## 2023-09-08 NOTE — Consult Note (Signed)
Urology Consult   Physician requesting consult: Dr. Alanda Slim  Reason for consult: GH/clott retention  History of Present Illness: Andrew Rivera is a 72 y.o. sent from [facility] to [facility] ED due to development of GH/clott retention. 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  No known prior history of gross hematuria. No known urologic hx. He is not routinely on blood thinners.    Past Medical History:  Diagnosis Date   Acute on chronic respiratory failure with hypoxia (HCC)    Allergic rhinitis    Altered mental status, unspecified    Chronic pain syndrome    Critical illness myopathy    Depression    GAD (generalized anxiety disorder)    Internal jugular (IJ) vein thromboembolism, acute, unspecified laterality (HCC)    Septic shock (HCC)     Past Surgical History:  Procedure Laterality Date   IR REPLC GASTRO/COLONIC TUBE PERCUT W/FLUORO  07/05/2021    Medications:  Home meds:    Scheduled Meds:  bethanechol  10 mg Per Tube TID   chlorhexidine  5 mL Mouth Rinse 2 times per day   escitalopram  5 mg Oral Daily   fentaNYL  1 patch Transdermal Q72H   loratadine  10 mg Oral q1800   mouth rinse  15 mL Mouth Rinse Q4H   Continuous Infusions:  lactated ringers 100 mL/hr at 09/08/23 0658   sodium chloride irrigation     sodium chloride irrigation     PRN Meds:.acetaminophen **OR** acetaminophen, albuterol, HYDROmorphone, lip balm, LORazepam, ondansetron (ZOFRAN) IV  Allergies:  Allergies  Allergen Reactions   Chlorhexidine Itching and Rash   Codeine Nausea Only   Oxycodone-Acetaminophen Nausea Only    Other reaction(s): Hallucinations    History reviewed. No pertinent family history.  Social History:  reports that he quit smoking about 33 years ago. His smoking use included cigarettes. He has never used smokeless tobacco. He  reports that he does not currently use alcohol. He reports that he does not use drugs.  ROS: A complete review of systems was performed.  All systems are negative except for pertinent findings as noted.  Physical Exam:  Vital signs in last 24 hours: Temp:  [97.9 F (36.6 C)-98.3 F (36.8 C)] 97.9 F (36.6 C) (11/16 0449) Pulse Rate:  [80-100] 81 (11/16 0500) Resp:  [16] 16 (11/16 0500) BP: (132-141)/(44-67) 141/44 (11/16 0500) SpO2:  [96 %-100 %] 100 % (11/16 0500) FiO2 (%):  [35 %] 35 % (11/16 0729) Weight:  [90.9 kg] 90.9 kg (11/16 0449) Constitutional:  Alert and oriented, No acute distress   Laboratory Data:  Recent Labs    09/07/23 2300  WBC 13.8*  HGB 12.6*  HCT 37.4*  PLT 332    Recent Labs    09/07/23 2300  NA 137  K 3.9  CL 101  GLUCOSE 111*  BUN 25*  CALCIUM 8.4*  CREATININE <0.30*     Results for orders placed or performed during the hospital encounter of 09/07/23 (from the past 24 hour(s))  Type and screen [facility]     Status: None   Collection Time: 09/07/23 10:50 PM  Result Value Ref Range   ABO/RH(D) O NEG    Antibody Screen NEG    Sample Expiration      09/10/2023,2359 Performed at [facility], [address] [place], [place] [address]   Comprehensive metabolic  panel     Status: Abnormal   Collection Time: 09/07/23 11:00 PM  Result Value Ref Range   Sodium 137 135 - 145 mmol/L   Potassium 3.9 3.5 - 5.1 mmol/L   Chloride 101 98 - 111 mmol/L   CO2 29 22 - 32 mmol/L   Glucose, Bld 111 (H) 70 - 99 mg/dL   BUN 25 (H) 8 - 23 mg/dL   Creatinine, Ser <4.40 (L) 0.61 - 1.24 mg/dL   Calcium 8.4 (L) 8.9 - 10.3 mg/dL   Total Protein 7.1 6.5 - 8.1 g/dL   Albumin 2.9 (L) 3.5 - 5.0 g/dL   AST 22 15 - 41 U/L   ALT 20 0 - 44 U/L   Alkaline Phosphatase 104 38 - 126 U/L   Total Bilirubin 0.4 <1.2 mg/dL   GFR, Estimated NOT CALCULATED >60 mL/min   Anion gap 7 5 - 15  CBC with Differential     Status:  Abnormal   Collection Time: 09/07/23 11:00 PM  Result Value Ref Range   WBC 13.8 (H) 4.0 - 10.5 K/uL   RBC 4.02 (L) 4.22 - 5.81 MIL/uL   Hemoglobin 12.6 (L) 13.0 - 17.0 g/dL   HCT 34.7 (L) 42.5 - 95.6 %   MCV 93.0 80.0 - 100.0 fL   MCH 31.3 26.0 - 34.0 pg   MCHC 33.7 30.0 - 36.0 g/dL   RDW 38.7 56.4 - 33.2 %   Platelets 332 150 - 400 K/uL   nRBC 0.0 0.0 - 0.2 %   Neutrophils Relative % 73 %   Neutro Abs 9.9 (H) 1.7 - 7.7 K/uL   Lymphocytes Relative 15 %   Lymphs Abs 2.1 0.7 - 4.0 K/uL   Monocytes Relative 8 %   Monocytes Absolute 1.1 (H) 0.1 - 1.0 K/uL   Eosinophils Relative 4 %   Eosinophils Absolute 0.6 (H) 0.0 - 0.5 K/uL   Basophils Relative 0 %   Basophils Absolute 0.1 0.0 - 0.1 K/uL   Immature Granulocytes 0 %   Abs Immature Granulocytes 0.05 0.00 - 0.07 K/uL  Protime-INR     Status: None   Collection Time: 09/07/23 11:00 PM  Result Value Ref Range   Prothrombin Time 13.8 11.4 - 15.2 seconds   INR 1.0 0.8 - 1.2  MRSA Next Gen by PCR, Nasal     Status: None   Collection Time: 09/08/23  5:20 AM   Specimen: Nasal Mucosa; Nasal Swab  Result Value Ref Range   MRSA by PCR Next Gen NOT DETECTED NOT DETECTED   Recent Results (from the past 240 hour(s))  MRSA Next Gen by PCR, Nasal     Status: None   Collection Time: 09/08/23  5:20 AM   Specimen: Nasal Mucosa; Nasal Swab  Result Value Ref Range Status   MRSA by PCR Next Gen NOT DETECTED NOT DETECTED Final    Comment: (NOTE) The GeneXpert MRSA Assay (FDA approved for NASAL specimens only), is one component of a comprehensive MRSA colonization surveillance program. It is not intended to diagnose MRSA infection nor to guide or monitor treatment for MRSA infections. Test performance is not FDA approved in patients less than 6 years old. Performed at [facility], [address], [place], [place] [address]     Renal Function: Recent Labs    09/07/23 2300  CREATININE <0.30*   CrCl cannot be  calculated (This lab value cannot be used to calculate CrCl because it is not a number: <0.30).  Radiologic Imaging: CT ABDOMEN PELVIS W CONTRAST  Result Date: 09/08/2023 CLINICAL DATA:  Hematuria. EXAM: CT ABDOMEN AND PELVIS WITH CONTRAST TECHNIQUE: Multidetector CT imaging of the abdomen and pelvis was performed using the standard protocol following bolus administration of intravenous contrast. RADIATION DOSE REDUCTION: This exam was performed according to the departmental dose-optimization program which includes automated exposure control, adjustment of the mA and/or kV according to patient size and/or use of iterative reconstruction technique. CONTRAST:  OMNIPAQUE IOHEXOL 300 MG/ML  SOLN COMPARISON:  CT abdomen pelvis dated 10/07/2022. FINDINGS: Lower chest: Partially visualized bibasilar subpleural consolidative changes with air bronchogram also present on the prior CT and may represent chronic atelectasis or pneumonia. Clinical correlation is recommended. No intra-abdominal free air or free fluid. Hepatobiliary: Multiple subcentimeter hepatic hypodense lesions are too small to characterize. No biliary ductal dilatation. The gallbladder is unremarkable. Pancreas: Unremarkable. No pancreatic ductal dilatation or surrounding inflammatory changes. Spleen: Normal in size without focal abnormality. Adrenals/Urinary Tract: The adrenal glands unremarkable. There is no hydronephrosis on either side. There is symmetric enhancement and excretion of contrast by both kidneys. The visualized ureters appear unremarkable. There is trabeculated and irregular appearance of the bladder wall, likely related to chronic bladder outlet obstruction. Multiple bladder diverticula noted. Large amount of high attenuating content within the bladder consistent with blood product/clot. Underlying bladder mass is not excluded but cannot be evaluated due to presence of blood clot. Further evaluation with cystoscopy is recommended.  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 There is loose stool throughout the colon consistent with diarrheal state. There is sigmoid diverticulosis without active inflammatory changes. There is no bowel obstruction or active inflammation. The appendix is not visualized with certainty. No inflammatory changes identified in the right lower quadrant. Vascular/Lymphatic: Moderate aortoiliac atherosclerotic disease. The IVC is unremarkable. No portal venous gas. There is no adenopathy. Reproductive: The prostate and seminal vesicles are grossly unremarkable. No pelvic mass. Other: None Musculoskeletal: Osteopenia with severe degenerative changes and lower lumbar laminectomy. No acute osseous pathology. There is fatty atrophy of the paraspinal, abdominal, and pelvic musculature. IMPRESSION: 1. Large amount of blood product/clot within the bladder. Further evaluation with cystoscopy is recommended. 2. No hydronephrosis or nephrolithiasis. 3. Sigmoid diverticulosis. No bowel obstruction. 4. 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 5. Chronic bibasilar subpleural atelectasis or infiltrate. 6.  Aortic Atherosclerosis (ICD10-I70.0). Electronically Signed   By: Elgie Collard M.D.   On: 09/08/2023 00:31    I independently reviewed the above imaging studies.  Impression/Recommendation GH/clott retention This am I placed a 22 Fr 3 way and had irrigated several liters. Large amt of clott removed.  Drainage light pink with cbi. Will check on later today. If it does not clear with conservative measures may need cysto/clott evac etc.  Joline Maxcy 09/08/2023, 8:31 AM

## 2023-09-09 ENCOUNTER — Inpatient Hospital Stay (HOSPITAL_COMMUNITY): Payer: Medicare HMO | Admitting: Anesthesiology

## 2023-09-09 ENCOUNTER — Encounter (HOSPITAL_COMMUNITY)
Admission: EM | Disposition: A | Discharge status: Nursing Home (Includes ICF) | Payer: Self-pay | Source: Other Acute Inpatient Hospital | Attending: Student

## 2023-09-09 DIAGNOSIS — Z9911 Dependence on respirator [ventilator] status: Secondary | ICD-10-CM | POA: Diagnosis not present

## 2023-09-09 DIAGNOSIS — R310 Gross hematuria: Secondary | ICD-10-CM | POA: Diagnosis not present

## 2023-09-09 DIAGNOSIS — Z930 Tracheostomy status: Secondary | ICD-10-CM | POA: Diagnosis not present

## 2023-09-09 DIAGNOSIS — D72829 Elevated white blood cell count, unspecified: Secondary | ICD-10-CM | POA: Diagnosis not present

## 2023-09-09 DIAGNOSIS — J449 Chronic obstructive pulmonary disease, unspecified: Secondary | ICD-10-CM | POA: Diagnosis not present

## 2023-09-09 HISTORY — PX: CYSTOSCOPY WITH FULGERATION: SHX6638

## 2023-09-09 LAB — RENAL FUNCTION PANEL
Albumin: 3 g/dL — ABNORMAL LOW (ref 3.5–5.0)
Anion gap: 11 (ref 5–15)
BUN: 21 mg/dL (ref 8–23)
CO2: 26 mmol/L (ref 22–32)
Calcium: 8.7 mg/dL — ABNORMAL LOW (ref 8.9–10.3)
Chloride: 101 mmol/L (ref 98–111)
Creatinine, Ser: <0.3 mg/dL — ABNORMAL LOW (ref 0.61–1.24)
Glucose, Bld: 114 mg/dL — ABNORMAL HIGH (ref 70–99)
Phosphorus: 2.3 mg/dL — ABNORMAL LOW (ref 2.5–4.6)
Potassium: 3.9 mmol/L (ref 3.5–5.1)
Sodium: 138 mmol/L (ref 135–145)

## 2023-09-09 LAB — CBC
HCT: 38 % — ABNORMAL LOW (ref 39.0–52.0)
Hemoglobin: 12.4 g/dL — ABNORMAL LOW (ref 13.0–17.0)
MCH: 30.9 pg (ref 26.0–34.0)
MCHC: 32.6 g/dL (ref 30.0–36.0)
MCV: 94.8 fL (ref 80.0–100.0)
Platelets: 332 10*3/uL (ref 150–400)
RBC: 4.01 MIL/uL — ABNORMAL LOW (ref 4.22–5.81)
RDW: 14.3 % (ref 11.5–15.5)
WBC: 14.9 10*3/uL — ABNORMAL HIGH (ref 4.0–10.5)
nRBC: 0 % (ref 0.0–0.2)

## 2023-09-09 LAB — MAGNESIUM: Magnesium: 2.5 mg/dL — ABNORMAL HIGH (ref 1.7–2.4)

## 2023-09-09 SURGERY — CYSTOSCOPY, WITH BLADDER FULGURATION
Anesthesia: General | Site: Bladder

## 2023-09-09 MED ORDER — PROPOFOL 10 MG/ML IV BOLUS
INTRAVENOUS | Status: DC | PRN
  Administered 2023-09-09: 50 mg via INTRAVENOUS

## 2023-09-09 MED ORDER — LIDOCAINE 2% (20 MG/ML) 5 ML SYRINGE
INTRAMUSCULAR | Status: DC | PRN
  Administered 2023-09-09: 20 mg via INTRAVENOUS

## 2023-09-09 MED ORDER — SODIUM CHLORIDE 0.9 % IV SOLN
1.0000 g | INTRAVENOUS | Status: DC
  Administered 2023-09-10 – 2023-09-11 (×2): 1 g via INTRAVENOUS
  Filled 2023-09-09 (×3): qty 10

## 2023-09-09 MED ORDER — ONDANSETRON HCL 4 MG/2ML IJ SOLN
INTRAMUSCULAR | Status: DC | PRN
  Administered 2023-09-09: 4 mg via INTRAVENOUS

## 2023-09-09 MED ORDER — LIDOCAINE HCL (PF) 2 % IJ SOLN
INTRAMUSCULAR | Status: AC
  Filled 2023-09-09: qty 5

## 2023-09-09 MED ORDER — PHENYLEPHRINE 80 MCG/ML (10ML) SYRINGE FOR IV PUSH (FOR BLOOD PRESSURE SUPPORT)
PREFILLED_SYRINGE | INTRAVENOUS | Status: DC | PRN
  Administered 2023-09-09 (×2): 80 ug via INTRAVENOUS

## 2023-09-09 MED ORDER — ONDANSETRON HCL 4 MG/2ML IJ SOLN
INTRAMUSCULAR | Status: AC
  Filled 2023-09-09: qty 2

## 2023-09-09 MED ORDER — BETHANECHOL CHLORIDE 10 MG PO TABS: 5.0000 mg | ORAL_TABLET | Freq: Three times a day (TID) | ORAL | Status: DC

## 2023-09-09 MED ORDER — SODIUM CHLORIDE 0.9 % IV SOLN: INTRAVENOUS | Status: DC | PRN

## 2023-09-09 MED ORDER — DEXTROSE 5 % IV SOLN
INTRAVENOUS | Status: DC | PRN
  Administered 2023-09-09: 2 g via INTRAVENOUS

## 2023-09-09 MED ORDER — SODIUM CHLORIDE 0.9 % IR SOLN
Status: DC | PRN
  Administered 2023-09-09: 6000 mL via INTRAVESICAL

## 2023-09-09 MED ORDER — PROPOFOL 10 MG/ML IV BOLUS
INTRAVENOUS | Status: AC
  Filled 2023-09-09: qty 20

## 2023-09-09 MED ORDER — STERILE WATER FOR IRRIGATION IR SOLN
Status: DC | PRN
Start: 2023-09-09 — End: 2023-09-09
  Administered 2023-09-09: 1000 mL

## 2023-09-09 MED ORDER — SODIUM CHLORIDE 0.9 % IV SOLN
INTRAVENOUS | Status: AC
  Filled 2023-09-09: qty 20

## 2023-09-09 SURGICAL SUPPLY — 19 items
BAG URINE DRAIN 2000ML AR STRL (UROLOGICAL SUPPLIES) IMPLANT
BAG URO CATCHER STRL LF (MISCELLANEOUS) ×1 IMPLANT
CATH HEMA 3WAY 30CC 22FR COUDE (CATHETERS) IMPLANT
DRAPE FOOT SWITCH (DRAPES) ×1 IMPLANT
ELECT REM PT RETURN 15FT ADLT (MISCELLANEOUS) ×1 IMPLANT
EVACUATOR MICROVAS BLADDER (UROLOGICAL SUPPLIES) IMPLANT
GLOVE BIO SURGEON STRL SZ8.5 (GLOVE) ×1 IMPLANT
GOWN STRL REUS W/ TWL XL LVL3 (GOWN DISPOSABLE) ×1 IMPLANT
GOWN STRL REUS W/TWL XL LVL3 (GOWN DISPOSABLE) ×1
KIT TURNOVER KIT A (KITS) IMPLANT
LOOP CUT BIPOLAR 24F LRG (ELECTROSURGICAL) IMPLANT
MANIFOLD NEPTUNE II (INSTRUMENTS) ×1 IMPLANT
PACK CYSTO (CUSTOM PROCEDURE TRAY) ×1 IMPLANT
PAD PREP 24X48 CUFFED NSTRL (MISCELLANEOUS) ×1 IMPLANT
PLUG CATH AND CAP STRL 200 (CATHETERS) ×1 IMPLANT
SYR TOOMEY IRRIG 70ML (MISCELLANEOUS)
SYRINGE TOOMEY IRRIG 70ML (MISCELLANEOUS) IMPLANT
TUBING CONNECTING 10 (TUBING) ×1 IMPLANT
TUBING UROLOGY SET (TUBING) ×1 IMPLANT

## 2023-09-09 NOTE — Progress Notes (Signed)
PROGRESS NOTE  Andrew Rivera FIE:[id number] DOB: 1951/04/16   PCP: Michail Sermon, MD  Patient is from: [facility]  DOA: 09/07/2023 LOS: 1  Chief complaints Chief Complaint  Patient presents with   Hematuria     Brief Narrative / Interim history: 72 year old M with PMH of chronic hypoxic RF on vent via trach, dysphagia on PEG, quadriplegia, chronic leukocytosis, anxiety, depression, allergic rhinitis and obesity sent to ED from [facility] due to gross hematuria with blood clots for 1 day.  Patient is not on anticoagulation or antiplatelet.  No recent trauma.  No recent Foley.  In ED, vital stable.  Hgb 12.6 (baseline).  WBC 13.8 (baseline).  CMP without significant finding.  PT/INR within normal.  CT abdomen and pelvis showed large amount of blood products/clot within bladder but no hydronephrosis or nephrolithiasis.  Urology consulted.  Foley catheter inserted and started on CBI.  Hematuria subsided.  H&H remained stable.  Patient underwent cystoscopy, fulguration, clot evacuation and TURB on 11/17.   Subjective: Seen and examined earlier this morning before he went for procedure.  No major events overnight of this morning.  No complaints other than feeling tired.  Objective: Vitals:   09/09/23 1000 09/09/23 1100 09/09/23 1237 09/09/23 1300  BP: (!) 161/38 (!) 166/36  (!) 186/54  Pulse: 86 85  77  Resp: 16 16  16   Temp:      TempSrc:      SpO2: 100% 100% 100% 100%  Weight:      Height:        Examination:  GENERAL: No apparent distress.  Nontoxic. HEENT: MMM.  Vision and hearing grossly intact.  NECK: Tracheostomy.  On vent via trach. RESP:  No IWOB.  Fair aeration bilaterally. CVS:  RRR. Heart sounds normal.  ABD/GI/GU: BS+. Abd soft, NTND.  MSK/EXT: Quadriplegia.  No apparent deformity. SKIN: no apparent skin lesion or wound NEURO: Awake, alert and oriented appropriately.  Aphonia.  Quadriplegia. PSYCH: Calm. Normal affect.   Procedures:  11/17-cystoscopy  with fulguration, clot evacuation and TURB  Microbiology summarized: MRSA PCR screen nonreactive.  Assessment and plan: Gross hematuria: Unclear etiology.  No known history of bladder cancer, recent catheterization, anticoagulation or antiplatelet use.  CT showed blood products/clots in bladder. Coag labs negative.  Low suspicion for infection.  Bleeding seems to have subsided. H&H stable -S/p cystoscopy with fulguration, clot evacuation and TURP -Will clarify need for antibiotics, Proscar and Flomax with urology -Discontinue bethanechol in the setting of prostamegaly.   Chronic hypoxic respiratory failure with trach and vent dependence -Vent and trach management per respiratory  Dysphagia with PEG dependence -Consulted dietitian for tube feed initiation.  Normocytic anemia: Stable -Continue monitoring  Quadriplegia-noted.  Anxiety and depression: Stable -Continue home meds  Chronic leukocytosis: Mild and stable.  Low suspicion for infection.  Allergic rhinitis -Continue antihistamine  Hyponatremia: Resolved.  Chronic pain -Continue home medication  Elevated blood pressure: No history of hypertension -Discontinue IV fluids -IV labetalol as needed  Obesity Body mass index is 30.47 kg/m.   Nutrition Problem: Inadequate oral intake Etiology: chronic illness Signs/Symptoms: NPO status Interventions: Tube feeding  DVT prophylaxis:  SCDs Start: 09/08/23 0215  Code Status: Full code Family Communication: Updated patient's wife over the phone on 11/16.  No answer on 11/17. Level of care: Stepdown Status is: Inpatient Remains inpatient appropriate because: Gross hematuria   Final disposition: Likely back to [facility] when medically stable Consultants:  Urology  55 minutes with more than 50% spent in  reviewing records, counseling patient/family and coordinating care.   Sch Meds:  Scheduled Meds:  bethanechol  10 mg Per Tube TID   chlorhexidine  5 mL Mouth  Rinse 2 times per day   escitalopram  5 mg Per Tube Daily   feeding supplement (PROSource TF20)  60 mL Per Tube Daily   fentaNYL  1 patch Transdermal Q72H   free water  145 mL Per Tube Q4H   loratadine  10 mg Per Tube q1800   mouth rinse  15 mL Mouth Rinse Q4H   Continuous Infusions:  feeding supplement (OSMOLITE 1.5 CAL) 50 mL/hr at 09/09/23 1200   sodium chloride irrigation     sodium chloride irrigation     PRN Meds:.acetaminophen **OR** acetaminophen, albuterol, HYDROmorphone, labetalol, lip balm, LORazepam, ondansetron (ZOFRAN) IV  Antimicrobials: Anti-infectives (From admission, onward)    Start     Dose/Rate Route Frequency Ordered Stop   09/09/23 1141  sodium chloride 0.9 % with cefTRIAXone (ROCEPHIN) ADS Med       Note to Pharmacy: Yevonne Pax C: cabinet override      09/09/23 1141 09/09/23 1417        I have personally reviewed the following labs and images: CBC: Recent Labs  Lab 09/07/23 2300 09/08/23 0910 09/09/23 0754  WBC 13.8* 14.1* 14.9*  NEUTROABS 9.9* 12.0*  --   HGB 12.6* 12.5* 12.4*  HCT 37.4* 38.2* 38.0*  MCV 93.0 94.6 94.8  PLT 332 298 332   BMP &GFR Recent Labs  Lab 09/07/23 2300 09/08/23 0910 09/09/23 0754  NA 137 133* 138  K 3.9 3.9 3.9  CL 101 100 101  CO2 29 25 26   GLUCOSE 111* 115* 114*  BUN 25* 22 21  CREATININE <0.30* <0.30* <0.30*  CALCIUM 8.4* 8.3* 8.7*  MG  --  2.3 2.5*  PHOS  --   --  2.3*   CrCl cannot be calculated (This lab value cannot be used to calculate CrCl because it is not a number: <0.30). Liver & Pancreas: Recent Labs  Lab 09/07/23 2300 09/08/23 0910 09/09/23 0754  AST 22 17  --   ALT 20 20  --   ALKPHOS 104 100  --   BILITOT 0.4 0.5  --   PROT 7.1 7.1  --   ALBUMIN 2.9* 3.0* 3.0*   No results for input(s): "LIPASE", "AMYLASE" in the last 168 hours. No results for input(s): "AMMONIA" in the last 168 hours. Diabetic: No results for input(s): "HGBA1C" in the last 72 hours. No results for  input(s): "GLUCAP" in the last 168 hours. Cardiac Enzymes: No results for input(s): "CKTOTAL", "CKMB", "CKMBINDEX", "TROPONINI" in the last 168 hours. No results for input(s): "PROBNP" in the last 8760 hours. Coagulation Profile: Recent Labs  Lab 09/07/23 2300  INR 1.0   Thyroid Function Tests: No results for input(s): "TSH", "T4TOTAL", "FREET4", "T3FREE", "THYROIDAB" in the last 72 hours. Lipid Profile: No results for input(s): "CHOL", "HDL", "LDLCALC", "TRIG", "CHOLHDL", "LDLDIRECT" in the last 72 hours. Anemia Panel: No results for input(s): "VITAMINB12", "FOLATE", "FERRITIN", "TIBC", "IRON", "RETICCTPCT" in the last 72 hours. Urine analysis:    Component Value Date/Time   COLORURINE YELLOW 10/07/2022 1349   APPEARANCEUR CLOUDY (A) 10/07/2022 1349   LABSPEC 1.014 10/07/2022 1349   PHURINE 5.0 10/07/2022 1349   GLUCOSEU NEGATIVE 10/07/2022 1349   HGBUR NEGATIVE 10/07/2022 1349   BILIRUBINUR NEGATIVE 10/07/2022 1349   KETONESUR NEGATIVE 10/07/2022 1349   PROTEINUR NEGATIVE 10/07/2022 1349   NITRITE POSITIVE (A) 10/07/2022  1349   LEUKOCYTESUR LARGE (A) 10/07/2022 1349   Sepsis Labs: Invalid input(s): "PROCALCITONIN", "LACTICIDVEN"  Microbiology: Recent Results (from the past 240 hour(s))  MRSA Next Gen by PCR, Nasal     Status: None   Collection Time: 09/08/23  5:20 AM   Specimen: Nasal Mucosa; Nasal Swab  Result Value Ref Range Status   MRSA by PCR Next Gen NOT DETECTED NOT DETECTED Final    Comment: (NOTE) The GeneXpert MRSA Assay (FDA approved for NASAL specimens only), is one component of a comprehensive MRSA colonization surveillance program. It is not intended to diagnose MRSA infection nor to guide or monitor treatment for MRSA infections. Test performance is not FDA approved in patients less than 64 years old. Performed at [facility], [address], [place], [place] [address]     Radiology Studies: No results found.    Lycia Sachdeva T.  Darry Kelnhofer [facility]  If 7PM-7AM, please contact night-coverage [email or web address] 09/09/2023, 2:27 PM

## 2023-09-09 NOTE — Anesthesia Postprocedure Evaluation (Signed)
Anesthesia Post Note  Patient: Andrew Rivera  Procedure(s) Performed: CYSTOSCOPY WITH FULGERATION; CLOT EVACUATION;  TUR (Bladder)     Patient location during evaluation: ICU Anesthesia Type: General Level of consciousness: oriented, patient cooperative and awake and alert Pain management: pain level controlled Vital Signs Assessment: post-procedure vital signs reviewed and stable Respiratory status: patient on ventilator - see flowsheet for VS Cardiovascular status: blood pressure returned to baseline and stable Postop Assessment: no apparent nausea or vomiting Anesthetic complications: no Comments: Pt awake, comfortable, stable, back on ICU vent   No notable events documented.  Last Vitals:  Vitals:   09/09/23 1000 09/09/23 1237  BP: (!) 161/38   Pulse: 86   Resp: 16   Temp:    SpO2: 100% 100%    Last Pain:  Vitals:   09/09/23 0820  TempSrc: Oral  PainSc:                  Tauheedah Bok,E. Rockell Faulks

## 2023-09-09 NOTE — Transfer of Care (Signed)
Immediate Anesthesia Transfer of Care Note  Patient: Andrew Rivera  Procedure(s) Performed: CYSTOSCOPY WITH FULGERATION; CLOT EVACUATION;  TUR (Bladder)  Patient Location: ICU  Anesthesia Type:General  Level of Consciousness: awake, alert , and oriented  Airway & Oxygen Therapy: Patient placed on Ventilator (see vital sign flow sheet for setting)  Post-op Assessment: Report given to RN and Post -op Vital signs reviewed and stable  Post vital signs: Reviewed and stable  Last Vitals:  Vitals Value Taken Time  BP    Temp    Pulse    Resp    SpO2    See ICU chart for vitals. Pt stable in ICU and during transport  Last Pain:  Vitals:   09/09/23 0820  TempSrc: Oral  PainSc:          Complications: No notable events documented.

## 2023-09-09 NOTE — Plan of Care (Signed)

## 2023-09-09 NOTE — Anesthesia Preprocedure Evaluation (Addendum)
Anesthesia Evaluation  Patient identified by MRN, date of birth, ID band Patient awake    Reviewed: Allergy & Precautions, NPO status , Patient's Chart, lab work & pertinent test results, Unable to perform ROS - Chart review only  History of Anesthesia Complications Negative for: history of anesthetic complications  Airway Mallampati: Trach  TM Distance: >3 FB Neck ROM: Limited    Dental  (+) Teeth Intact   Pulmonary COPD,  COPD inhaler, former smoker chronic hypoxic RF on vent via trach   breath sounds clear to auscultation       Cardiovascular negative cardio ROS  Rhythm:Regular Rate:Normal     Neuro/Psych   Anxiety Depression    Quadriplegic: s/p multiple neck and spinal surgeries Vent dependent    GI/Hepatic Neg liver ROS,GERD  Medicated and Controlled,,Dysphagia with PEG dependence   Endo/Other    Class 3 obesityBMI 30.5  Renal/GU negative Renal ROS   hematuria    Musculoskeletal quadriplegic   Abdominal   Peds  Hematology negative hematology ROS (+)   Anesthesia Other Findings   Reproductive/Obstetrics                              Anesthesia Physical Anesthesia Plan  ASA: 4  Anesthesia Plan: General   Post-op Pain Management: Minimal or no pain anticipated   Induction: Intravenous  PONV Risk Score and Plan: 2 and Ondansetron and Dexamethasone  Airway Management Planned: Tracheostomy  Additional Equipment: None  Intra-op Plan:   Post-operative Plan: Post-operative intubation/ventilation  Informed Consent: I have reviewed the patients History and Physical, chart, labs and discussed the procedure including the risks, benefits and alternatives for the proposed anesthesia with the patient or authorized representative who has indicated his/her understanding and acceptance.     Dental advisory given and History available from chart only  Plan Discussed with: CRNA and  Surgeon  Anesthesia Plan Comments:          Anesthesia Quick Evaluation

## 2023-09-09 NOTE — Op Note (Signed)
OPERATIVE NOTE   Patient Name: Andrew Rivera  MRN: [id number]   Date of Procedure: 09/08/2022   Preoperative diagnosis:  Gross hematuria with clott retention  Postoperative diagnosis:  same  Procedure:  Cystoscopy, clott evacuation, fulguration  Attending: Marcha Solders, MD  Anesthesia: general  Estimated blood loss: minimal  Fluids: Per anesthesia record  Drains: new 22 fr heme cath placed  Specimens: none  Antibiotics: rocephin  Findings: Moderate amount of clot still within the bladder with many found within multiple large diverticula.  Patient has a end-stage appearing bladder with too numerous to count diverticula  Indications: Hematuria with clot retention  Description of Procedure:  Patient was brought to the operating room where he was correctly identified.  He then underwent general anesthesia and was placed in the dorsal lithotomy position with his external genitalia prepped and draped in the usual sterile fashion.  A 22 French panendoscope was inserted per urethra.  There was evidence of urethral right is likely given recent catheterization.  The prostatic urethra was remarkable for being quite long and showing no significant friability especially near the bladder neck.  The bladder neck was also elevated and there was some oozing at the bladder neck.  The bladder was subsequently entered and carefully inspected.  The patient has what appears to be essentially an end-stage tight bladder.  The patient has multiple trabeculations and large too numerous to count diverticuli.  I could never dinner for either ureteral opening.  There is also relatively diffuse areas of inflammatory appearing change.  There is also a moderate amount of clot within the bladder.  The bladder clot was removed using a Network engineer.  After all the clot was removed I carefully spoke to the bladder and tried to look in his many diverticuli as I could.  I do not see any evidence for  bladder tumors.  There were some friable areas that look like they could bleed quite easily.  The cystoscope was removed and I placed under direct vision a 25 French continuous-flow resectoscope with visual obturator.  I subsequently used the bipolar apparatus.  The some of these are friable areas that look most likely the complete blood fulgurated with the cautery as well as some oozing near the bladder neck.  At this time there is no active bleeding appreciated.  The scope was removed and I placed a 22 [place] coud hematuria catheter without difficulty with clear returns.  CBI was restarted and the procedure terminated.  The patient tolerated the procedure well.  There were no apparent complications.  He was taken to the recovery in satisfactory condition.  Complications: None  Condition: Stable, extubated, transferred to PACU  Plan: continue cbi, start finasteride, continue antibiotics

## 2023-09-10 ENCOUNTER — Encounter (HOSPITAL_COMMUNITY): Payer: Self-pay | Admitting: Urology

## 2023-09-10 DIAGNOSIS — D72829 Elevated white blood cell count, unspecified: Secondary | ICD-10-CM | POA: Diagnosis not present

## 2023-09-10 DIAGNOSIS — Z9911 Dependence on respirator [ventilator] status: Secondary | ICD-10-CM | POA: Diagnosis not present

## 2023-09-10 DIAGNOSIS — Z930 Tracheostomy status: Secondary | ICD-10-CM | POA: Diagnosis not present

## 2023-09-10 DIAGNOSIS — R310 Gross hematuria: Secondary | ICD-10-CM | POA: Diagnosis not present

## 2023-09-10 LAB — CBC
HCT: 31.7 % — ABNORMAL LOW (ref 39.0–52.0)
Hemoglobin: 10 g/dL — ABNORMAL LOW (ref 13.0–17.0)
MCH: 30.7 pg (ref 26.0–34.0)
MCHC: 31.5 g/dL (ref 30.0–36.0)
MCV: 97.2 fL (ref 80.0–100.0)
Platelets: 299 10*3/uL (ref 150–400)
RBC: 3.26 MIL/uL — ABNORMAL LOW (ref 4.22–5.81)
RDW: 14.4 % (ref 11.5–15.5)
WBC: 12.6 10*3/uL — ABNORMAL HIGH (ref 4.0–10.5)
nRBC: 0 % (ref 0.0–0.2)

## 2023-09-10 LAB — RENAL FUNCTION PANEL
Albumin: 2.8 g/dL — ABNORMAL LOW (ref 3.5–5.0)
Anion gap: 7 (ref 5–15)
BUN: 20 mg/dL (ref 8–23)
CO2: 29 mmol/L (ref 22–32)
Calcium: 8.5 mg/dL — ABNORMAL LOW (ref 8.9–10.3)
Chloride: 104 mmol/L (ref 98–111)
Creatinine, Ser: <0.3 mg/dL — ABNORMAL LOW (ref 0.61–1.24)
Glucose, Bld: 176 mg/dL — ABNORMAL HIGH (ref 70–99)
Phosphorus: 2.2 mg/dL — ABNORMAL LOW (ref 2.5–4.6)
Potassium: 3.6 mmol/L (ref 3.5–5.1)
Sodium: 140 mmol/L (ref 135–145)

## 2023-09-10 LAB — MAGNESIUM: Magnesium: 2.3 mg/dL (ref 1.7–2.4)

## 2023-09-10 MED ORDER — HYDROCERIN EX CREA
TOPICAL_CREAM | Freq: Two times a day (BID) | CUTANEOUS | Status: DC
  Filled 2023-09-10: qty 113

## 2023-09-10 MED ORDER — LORATADINE 5 MG/5ML PO SOLN: 10.0000 mg | Freq: Every day | ORAL | Status: DC | PRN

## 2023-09-10 MED ORDER — LORATADINE 5 MG/5ML PO SOLN
10.0000 mg | Freq: Every day | ORAL | Status: DC
  Administered 2023-09-11 – 2023-09-12 (×2): 10 mg
  Filled 2023-09-10 (×2): qty 10

## 2023-09-10 MED ORDER — HYDROMORPHONE HCL 1 MG/ML IJ SOLN
0.5000 mg | Freq: Once | INTRAMUSCULAR | Status: AC
  Administered 2023-09-10: 0.5 mg via INTRAVENOUS
  Filled 2023-09-10: qty 1

## 2023-09-10 MED ORDER — LABETALOL HCL 5 MG/ML IV SOLN
10.0000 mg | INTRAVENOUS | Status: DC | PRN
  Administered 2023-09-11 (×3): 10 mg via INTRAVENOUS
  Filled 2023-09-10 (×3): qty 4

## 2023-09-10 MED ORDER — FUROSEMIDE 40 MG PO TABS
40.0000 mg | ORAL_TABLET | Freq: Every day | ORAL | Status: DC
  Administered 2023-09-10 – 2023-09-12 (×3): 40 mg
  Filled 2023-09-10 (×3): qty 1

## 2023-09-10 NOTE — Progress Notes (Signed)
Nutrition Follow-up  DOCUMENTATION CODES:   Not applicable  INTERVENTION:  - Continue current TF regimen via PEG: Osmolite 1.5 at 50 ml/h (1200 ml per day) Prosource TF20 60 ml once + free water flushes q4h  Provides 1880 kcal, 95 gm protein, 1784 ml free water daily (TF + FWF)  - Monitor weight trends.   NUTRITION DIAGNOSIS:   Inadequate oral intake related to chronic illness as evidenced by NPO status. *ongoing  GOAL:   Patient will meet greater than or equal to 90% of their needs *to be met with TF  MONITOR:   Labs, Weight trends, TF tolerance  REASON FOR ASSESSMENT:   Consult, Ventilator Enteral/tube feeding initiation and management  ASSESSMENT:   Pt admitted from [facility] with gross hematuria. PMH significant for chronic hypoxic respiratory failure, chronically vent dependent, s/p trach and PEG, depression, generalized anxiety disorder, allergic rhinitis, chronic leukocytosis.  11/16 Admit, TF protocol started  Patient resting on vent at time of visit, alert and oriented. Communicated via mouthing words.   UBW reported to be 165# and patient endorses weight gain for >1 year. No weight history within the past 1 year but weight this admit up 18# from weight in August 2023.  He is unsure what tube feed regimen he was on at facility. No documented TF regimen in home medications list.  Patient currently on Osmolite 1.5 at 7mL/hr and tolerating well. Denies any issues.  Will continue current regimen. Monitor weights closely.    Medications reviewed and include: Q4H FWF  Labs reviewed:  Phosphorus 2.2   NUTRITION - FOCUSED PHYSICAL EXAM:  Flowsheet Row Most Recent Value  Orbital Region Moderate depletion  Upper Arm Region No depletion  Thoracic and Lumbar Region No depletion  Buccal Region Moderate depletion  Temple Region Severe depletion  Clavicle Bone Region No depletion  Clavicle and Acromion Bone Region No depletion  Scapular Bone  Region Unable to assess  Dorsal Hand No depletion  Patellar Region No depletion  Anterior Thigh Region No depletion  Posterior Calf Region No depletion  Edema (RD Assessment) Mild  Hair Reviewed  Eyes Reviewed  Mouth Reviewed  Skin Reviewed  Nails Reviewed       Diet Order:   Diet Order             Diet NPO time specified  Diet effective now                   EDUCATION NEEDS:  No education needs have been identified at this time  Skin:  Skin Assessment: Reviewed RN Assessment  Last BM:  11/18 - type 7  Height:  Ht Readings from Last 1 Encounters:  09/08/23 5\' 8"  (1.727 m)   Weight:  Wt Readings from Last 1 Encounters:  09/08/23 90.9 kg    BMI:  Body mass index is 30.47 kg/m.  Estimated Nutritional Needs:  Kcal:  1800-2000 Protein:  90-105g Fluid:  >/=1.8L    Shelle Iron RD, LDN For contact information, refer to [facility].

## 2023-09-10 NOTE — TOC Initial Note (Signed)
Transition of Care [facility]) - Initial/Assessment Note    Patient Details  Name: Andrew Rivera MRN: [id number] Date of Birth: 02-29-1951  Transition of Care [facility]) CM/SW Contact:    Lavenia Atlas, RN Phone Number: 09/10/2023, 3:54 PM  Clinical Narrative:    Per chart review patient currently in [facility] ICU. At baseline patient trach/vent/PEG dependent from [facility].   TOC will continue to follow for discharge needs.                Expected Discharge Plan: Long Term Acute Care (LTAC) Barriers to Discharge: Continued Medical Work up   Patient Goals and CMS Choice Patient states their goals for this hospitalization and ongoing recovery are:: to feel better   Choice offered to / list presented to : NA [place] ownership interest in [facility].provided to::  (N/a)    Expected Discharge Plan and Services In-house Referral: NA Discharge Planning Services: CM Consult Post Acute Care Choice: Long Term Acute Care (LTAC) Living arrangements for the past 2 months: Post-Acute Facility (LTACH resident)                 DME Arranged: N/A DME Agency: NA       HH Arranged: NA HH Agency: NA        Prior Living Arrangements/Services Living arrangements for the past 2 months: Post-Acute Facility (LTACH resident) Lives with:: Facility Resident Patient language and need for interpreter reviewed:: Yes Do you feel safe going back to the place where you live?: Yes      Need for Family Participation in Patient Care: Yes (Comment) Care giver support system in place?: Yes (comment) Current home services: Other (comment) Criminal Activity/Legal Involvement Pertinent to Current Situation/Hospitalization: No - Comment as needed  Activities of Daily Living      Permission Sought/Granted Permission sought to share information with : Case Manager Permission granted to share information with : Yes, Verbal Permission Granted  Share Information with NAME: Case manager            Emotional Assessment Appearance:: Appears stated age Attitude/Demeanor/Rapport: Unable to Assess Affect (typically observed): Unable to Assess Orientation: : Oriented to Self, Oriented to Place, Oriented to Situation Alcohol / Substance Use: Not Applicable Psych Involvement: No (comment)  Admission diagnosis:  Gross hematuria [R31.0] Hematuria, unspecified type [R31.9] Patient Active Problem List   Diagnosis Date Noted   Gross hematuria 09/08/2023   Depression 09/08/2023   GAD (generalized anxiety disorder) 09/08/2023   Allergic rhinitis 09/08/2023   Leukocytosis 09/08/2023   Stage 1 skin ulcer of sacral region (HCC) 06/16/2022   Staring episodes 06/15/2022   Malnutrition of moderate degree 06/30/2021   Left inguinal hernia 06/29/2021   Dysautonomia (HCC)    Ventilator dependent (HCC)    Tracheostomy dependence (HCC)    Chronic hypoxic respiratory failure (HCC)    Abdominal distension    Constipation    Hypokalemia    Ileus (HCC) 04/24/2021   Pressure injury of skin 01/28/2020   Acute on chronic respiratory failure with hypoxia (HCC)    Septic shock (HCC)    Altered mental status, unspecified    Critical illness myopathy    Chronic pain syndrome    Internal jugular (IJ) vein thromboembolism, acute, unspecified laterality (HCC)    Lumbosacral radiculopathy 08/04/2019   Radicular pain of right lower extremity 08/07/2013   PCP:  Michail Sermon, MD Pharmacy:   [place] [facility] [address] [place] [address]  Phone: [phone] Fax: [phone]     Social Determinants of Health (SDOH) Social History: SDOH Screenings   Food Insecurity: No Food Insecurity (07/14/2019)   Received from [facility]  Tobacco Use: Medium Risk (09/08/2023)   SDOH Interventions:     Readmission Risk Interventions    09/10/2023    3:49 PM  Readmission Risk Prevention Plan  Transportation Screening Complete  PCP or  Specialist Appt within 5-7 Days Complete  Home Care Screening Complete  Medication Review (RN CM) Complete

## 2023-09-10 NOTE — Progress Notes (Signed)
   1 Day Post-Op Subjective: First time meeting Andrew Rivera.  I reviewed the plan for voiding trial with serial bladder scans to tease out whether or not he is having urinary incontinence or overflow.  He was amenable to this plan.  Objective: Vital signs in last 24 hours: Temp:  [98.8 F (37.1 C)-99.6 F (37.6 C)] 99 F (37.2 C) (11/18 0800) Pulse Rate:  [72-100] 96 (11/18 0900) Resp:  [16-19] 16 (11/18 0900) BP: (110-194)/(26-59) 187/57 (11/18 0900) SpO2:  [92 %-100 %] 97 % (11/18 0900) FiO2 (%):  [35 %] 35 % (11/18 0744)  Assessment/Plan: # Hematuria-resolved Hemoglobin 12.4(10.0), trend labs Interval improvement in leukocytosis 14.9 (12.6).  Receiving Rocephin.  ABX per primary To the OR with Dr. Margo Aye 09/09/2023 for clot evacuation and fulguration Clip CBI and catheter today with serial bladder scans and assessment of urinary incontinence versus overflow incontinence.  If the latter, considering the state of his bladder.  He would likely benefit from suprapubic tube placement.  Intake/Output from previous day: 11/17 0701 - 11/18 0700 In: 95621 [I.V.:250; NG/GT:300] Out: 7850 [Urine:7850]  Intake/Output this shift: Total I/O In: 750 [NG/GT:750] Out: 1550 [Urine:1550]  Physical Exam:  General: Alert and oriented CV: No cyanosis Lungs: Ventilated Gu: CBI off.  Clear irrigant.   Lab Results: Recent Labs    09/08/23 0910 09/09/23 0754 09/10/23 0310  HGB 12.5* 12.4* 10.0*  HCT 38.2* 38.0* 31.7*   BMET Recent Labs    09/09/23 0754 09/10/23 0310  NA 138 140  K 3.9 3.6  CL 101 104  CO2 26 29  GLUCOSE 114* 176*  BUN 21 20  CREATININE <0.30* <0.30*  CALCIUM 8.7* 8.5*     Studies/Results: No results found.    LOS: 2 days   Elmon Kirschner, NP [facility] Pager: [phone]  09/10/2023, 10:12 AM

## 2023-09-10 NOTE — Progress Notes (Signed)
After removing the foley per Urology NP order patient retained at 1300 after first two hour post removal . 75 mL  at 1500, and on the third bladder scan at 1700 scan resulted and also  voided dark red urine of 50 ML. Urology NP called and updated.   Beatrix Shipper

## 2023-09-10 NOTE — Progress Notes (Signed)
PROGRESS NOTE  LOC Andrew Rivera:[id number] DOB: 1951/05/28   PCP: Michail Sermon, MD  Patient is from: [facility]  DOA: 09/07/2023 LOS: 2  Chief complaints Chief Complaint  Patient presents with   Hematuria     Brief Narrative / Interim history: 72 year old M with PMH of chronic hypoxic RF on vent via trach, dysphagia on PEG, quadriplegia, chronic leukocytosis, anxiety, depression, allergic rhinitis and obesity sent to ED from [facility] due to gross hematuria with blood clots for 1 day.  Patient is not on anticoagulation or antiplatelet.  No recent trauma.  No recent Foley.  In ED, vital stable.  Hgb 12.6 (baseline).  WBC 13.8 (baseline).  CMP without significant finding.  PT/INR within normal.  CT abdomen and pelvis showed large amount of blood products/clot within bladder but no hydronephrosis or nephrolithiasis.  Urology consulted.  Foley catheter inserted and started on CBI.  Hematuria subsided.  H&H remained stable.  Patient underwent cystoscopy, fulguration, clot evacuation and TURB on 11/17.  Hematuria resolved but hemoglobin dropped about 2 g.  Urology following.   Subjective: Seen and examined earlier this morning.  No major events overnight of this morning.  No complaints.  Hematuria resolved.  Urine looks clear.  Hemoglobin dropped about 2 g.  Objective: Vitals:   09/10/23 0800 09/10/23 0900 09/10/23 1200 09/10/23 1300  BP: (!) 186/57 (!) 187/57 (!) 173/55 (!) 203/60  Pulse: 100 96 80 89  Resp: 16 16 16 16   Temp: 99 F (37.2 C)  98.8 F (37.1 C)   TempSrc: Oral  Oral   SpO2: 97% 97% 98% 98%  Weight:      Height:        Examination:  GENERAL: No apparent distress.  Nontoxic. HEENT: MMM.  Vision and hearing grossly intact.  NECK: Tracheostomy.  On vent via trach. RESP:  No IWOB.  Fair aeration bilaterally. CVS:  RRR. Heart sounds normal.  ABD/GI/GU: BS+. Abd soft.  Foley catheter in place.  Clear looking urine. MSK/EXT: Quadriplegia.  No apparent  deformity. SKIN: no apparent skin lesion or wound NEURO: Awake, alert and oriented appropriately.  [place].  Quadriplegia. PSYCH: Calm. Normal affect.   Procedures:  11/17-cystoscopy with fulguration, clot evacuation and TURB  Microbiology summarized: MRSA PCR screen nonreactive.  Assessment and plan: Gross hematuria: Unclear etiology.  No known history of bladder cancer, recent catheterization, anticoagulation or antiplatelet use.  CT showed blood products/clots in bladder. Coag labs negative.  Low suspicion for infection.  Hematuria resolved.  Hgb dropped about 2 g. -S/p cystoscopy with fulguration, clot evacuation and TURP by Dr. Margo Aye on 11/17. -Continue IV ceftriaxone -Bethanechol discontinued by urology.  Defer maintenance of incontinence to urology.  Chronic hypoxic respiratory failure with trach and vent dependence -Vent and trach management per respiratory  Dysphagia with PEG dependence -Consulted dietitian for tube feed initiation.  Normocytic anemia: Stable -Continue monitoring  Quadriplegia-noted.  Anxiety and depression: Stable -Continue home meds  Chronic leukocytosis: Mild and stable.  Low suspicion for infection.  Allergic rhinitis -Continue antihistamine  Hyponatremia: Resolved.  Chronic pain -Continue home medication  Elevated systolic blood pressure with normal/low diastolic blood pressure -Increase IV labetalol to 10 mg as needed. -Resume home Lasix  Obesity Body mass index is 30.47 kg/m. Nutrition Problem: Inadequate oral intake Etiology: chronic illness Signs/Symptoms: NPO status Interventions: Tube feeding  DVT prophylaxis:  SCDs Start: 09/08/23 0215  Code Status: Full code Family Communication: None at bedside. Level of care: ICU Status is: Inpatient Remains inpatient appropriate  because: Gross hematuria and elevated blood pressure   Final disposition: Likely back to [facility] when medically stable Consultants:  Urology  55  minutes with more than 50% spent in reviewing records, counseling patient/family and coordinating care.   Sch Meds:  Scheduled Meds:  chlorhexidine  5 mL Mouth Rinse 2 times per day   escitalopram  5 mg Per Tube Daily   feeding supplement (PROSource TF20)  60 mL Per Tube Daily   fentaNYL  1 patch Transdermal Q72H   free water  145 mL Per Tube Q4H   hydrocerin   Topical BID   [START ON 09/11/2023] loratadine  10 mg Per Tube Daily   mouth rinse  15 mL Mouth Rinse Q4H   Continuous Infusions:  cefTRIAXone (ROCEPHIN)  IV Stopped (09/10/23 1436)   feeding supplement (OSMOLITE 1.5 CAL) 1,000 mL (09/10/23 1309)   sodium chloride irrigation     sodium chloride irrigation     PRN Meds:.acetaminophen **OR** acetaminophen, albuterol, HYDROmorphone, labetalol, lip balm, LORazepam, ondansetron (ZOFRAN) IV  Antimicrobials: Anti-infectives (From admission, onward)    Start     Dose/Rate Route Frequency Ordered Stop   09/10/23 1200  cefTRIAXone (ROCEPHIN) 1 g in sodium chloride 0.9 % 100 mL IVPB        1 g 200 mL/hr over 30 Minutes Intravenous Every 24 hours 09/09/23 1455     09/09/23 1141  sodium chloride 0.9 % with cefTRIAXone (ROCEPHIN) ADS Med       Note to Pharmacy: Yevonne Pax C: cabinet override      09/09/23 1141 09/09/23 1417        I have personally reviewed the following labs and images: CBC: Recent Labs  Lab 09/07/23 2300 09/08/23 0910 09/09/23 0754 09/10/23 0310  WBC 13.8* 14.1* 14.9* 12.6*  NEUTROABS 9.9* 12.0*  --   --   HGB 12.6* 12.5* 12.4* 10.0*  HCT 37.4* 38.2* 38.0* 31.7*  MCV 93.0 94.6 94.8 97.2  PLT 332 298 332 299   BMP &GFR Recent Labs  Lab 09/07/23 2300 09/08/23 0910 09/09/23 0754 09/10/23 0310  NA 137 133* 138 140  K 3.9 3.9 3.9 3.6  CL 101 100 101 104  CO2 29 25 26 29   GLUCOSE 111* 115* 114* 176*  BUN 25* 22 21 20   CREATININE <0.30* <0.30* <0.30* <0.30*  CALCIUM 8.4* 8.3* 8.7* 8.5*  MG  --  2.3 2.5* 2.3  PHOS  --   --  2.3* 2.2*    CrCl cannot be calculated (This lab value cannot be used to calculate CrCl because it is not a number: <0.30). Liver & Pancreas: Recent Labs  Lab 09/07/23 2300 09/08/23 0910 09/09/23 0754 09/10/23 0310  AST 22 17  --   --   ALT 20 20  --   --   ALKPHOS 104 100  --   --   BILITOT 0.4 0.5  --   --   PROT 7.1 7.1  --   --   ALBUMIN 2.9* 3.0* 3.0* 2.8*   No results for input(s): "LIPASE", "AMYLASE" in the last 168 hours. No results for input(s): "AMMONIA" in the last 168 hours. Diabetic: No results for input(s): "HGBA1C" in the last 72 hours. No results for input(s): "GLUCAP" in the last 168 hours. Cardiac Enzymes: No results for input(s): "CKTOTAL", "CKMB", "CKMBINDEX", "TROPONINI" in the last 168 hours. No results for input(s): "PROBNP" in the last 8760 hours. Coagulation Profile: Recent Labs  Lab 09/07/23 2300  INR 1.0   Thyroid Function  Tests: No results for input(s): "TSH", "T4TOTAL", "FREET4", "T3FREE", "THYROIDAB" in the last 72 hours. Lipid Profile: No results for input(s): "CHOL", "HDL", "LDLCALC", "TRIG", "CHOLHDL", "LDLDIRECT" in the last 72 hours. Anemia Panel: No results for input(s): "VITAMINB12", "FOLATE", "FERRITIN", "TIBC", "IRON", "RETICCTPCT" in the last 72 hours. Urine analysis:    Component Value Date/Time   COLORURINE YELLOW 10/07/2022 1349   APPEARANCEUR CLOUDY (A) 10/07/2022 1349   LABSPEC 1.014 10/07/2022 1349   PHURINE 5.0 10/07/2022 1349   GLUCOSEU NEGATIVE 10/07/2022 1349   HGBUR NEGATIVE 10/07/2022 1349   BILIRUBINUR NEGATIVE 10/07/2022 1349   KETONESUR NEGATIVE 10/07/2022 1349   PROTEINUR NEGATIVE 10/07/2022 1349   NITRITE POSITIVE (A) 10/07/2022 1349   LEUKOCYTESUR LARGE (A) 10/07/2022 1349   Sepsis Labs: Invalid input(s): "PROCALCITONIN", "LACTICIDVEN"  Microbiology: Recent Results (from the past 240 hour(s))  MRSA Next Gen by PCR, Nasal     Status: None   Collection Time: 09/08/23  5:20 AM   Specimen: Nasal Mucosa; Nasal Swab   Result Value Ref Range Status   MRSA by PCR Next Gen NOT DETECTED NOT DETECTED Final    Comment: (NOTE) The GeneXpert MRSA Assay (FDA approved for NASAL specimens only), is one component of a comprehensive MRSA colonization surveillance program. It is not intended to diagnose MRSA infection nor to guide or monitor treatment for MRSA infections. Test performance is not FDA approved in patients less than 10 years old. Performed at [facility], [address], [place], [place] [address]     Radiology Studies: No results found.    Andrew Rivera T. Louis Ivery [facility]  If 7PM-7AM, please contact night-coverage [email or web address] 09/10/2023, 2:39 PM

## 2023-09-11 ENCOUNTER — Inpatient Hospital Stay (HOSPITAL_COMMUNITY): Payer: Medicare HMO

## 2023-09-11 DIAGNOSIS — Z930 Tracheostomy status: Secondary | ICD-10-CM | POA: Diagnosis not present

## 2023-09-11 DIAGNOSIS — D72829 Elevated white blood cell count, unspecified: Secondary | ICD-10-CM | POA: Diagnosis not present

## 2023-09-11 DIAGNOSIS — R310 Gross hematuria: Secondary | ICD-10-CM | POA: Diagnosis not present

## 2023-09-11 DIAGNOSIS — Z9911 Dependence on respirator [ventilator] status: Secondary | ICD-10-CM | POA: Diagnosis not present

## 2023-09-11 LAB — RENAL FUNCTION PANEL
Albumin: 2.6 g/dL — ABNORMAL LOW (ref 3.5–5.0)
Anion gap: 7 (ref 5–15)
BUN: 19 mg/dL (ref 8–23)
CO2: 29 mmol/L (ref 22–32)
Calcium: 8.3 mg/dL — ABNORMAL LOW (ref 8.9–10.3)
Chloride: 101 mmol/L (ref 98–111)
Creatinine, Ser: <0.3 mg/dL — ABNORMAL LOW (ref 0.61–1.24)
Glucose, Bld: 140 mg/dL — ABNORMAL HIGH (ref 70–99)
Phosphorus: 1.9 mg/dL — ABNORMAL LOW (ref 2.5–4.6)
Potassium: 3.4 mmol/L — ABNORMAL LOW (ref 3.5–5.1)
Sodium: 137 mmol/L (ref 135–145)

## 2023-09-11 LAB — CBC
HCT: 29.6 % — ABNORMAL LOW (ref 39.0–52.0)
Hemoglobin: 9.2 g/dL — ABNORMAL LOW (ref 13.0–17.0)
MCH: 30.7 pg (ref 26.0–34.0)
MCHC: 31.1 g/dL (ref 30.0–36.0)
MCV: 98.7 fL (ref 80.0–100.0)
Platelets: 308 10*3/uL (ref 150–400)
RBC: 3 MIL/uL — ABNORMAL LOW (ref 4.22–5.81)
RDW: 14.6 % (ref 11.5–15.5)
WBC: 10.3 10*3/uL (ref 4.0–10.5)
nRBC: 0 % (ref 0.0–0.2)

## 2023-09-11 LAB — TROPONIN I (HIGH SENSITIVITY)
Troponin I (High Sensitivity): 6 ng/L (ref <18)
Troponin I (High Sensitivity): 7 ng/L (ref <18)

## 2023-09-11 LAB — BRAIN NATRIURETIC PEPTIDE: B Natriuretic Peptide: 100.9 pg/mL — ABNORMAL HIGH (ref 0.0–100.0)

## 2023-09-11 LAB — MAGNESIUM: Magnesium: 2.1 mg/dL (ref 1.7–2.4)

## 2023-09-11 MED ORDER — GERHARDT'S BUTT CREAM
TOPICAL_CREAM | Freq: Two times a day (BID) | CUTANEOUS | Status: DC
  Filled 2023-09-11 (×2): qty 1

## 2023-09-11 MED ORDER — POTASSIUM CHLORIDE 10 MEQ/100ML IV SOLN
10.0000 meq | INTRAVENOUS | Status: AC
Start: 2023-09-11 — End: 2023-09-11
  Administered 2023-09-11 (×2): 10 meq via INTRAVENOUS
  Filled 2023-09-11 (×2): qty 100

## 2023-09-11 MED ORDER — AMLODIPINE BESYLATE 10 MG PO TABS
10.0000 mg | ORAL_TABLET | Freq: Every day | ORAL | Status: DC
  Administered 2023-09-11 – 2023-09-12 (×2): 10 mg
  Filled 2023-09-11 (×2): qty 1

## 2023-09-11 MED ORDER — POTASSIUM PHOSPHATES 15 MMOLE/5ML IV SOLN
30.0000 mmol | Freq: Once | INTRAVENOUS | Status: AC
  Administered 2023-09-11: 30 mmol via INTRAVENOUS
  Filled 2023-09-11: qty 10

## 2023-09-11 MED ORDER — ORAL CARE MOUTH RINSE
15.0000 mL | OROMUCOSAL | Status: DC
Start: 2023-09-11 — End: 2023-09-12
  Administered 2023-09-11 – 2023-09-12 (×3): 15 mL via OROMUCOSAL

## 2023-09-11 MED ORDER — ORAL CARE MOUTH RINSE: 15.0000 mL | OROMUCOSAL | Status: DC | PRN

## 2023-09-11 NOTE — TOC Progression Note (Addendum)
Transition of Care [facility]) - Progression Note    Patient Details  Name: Andrew Rivera MRN: [id number] Date of Birth: [birth year]  Transition of Care [facility]) CM/SW Contact  Lavenia Atlas, RN Phone Number: 09/11/2023, 12:13 PM  Clinical Narrative: Left voicemail message with [name] with [facility], awaiting call back. This RNCM spoke with patient's wife [name] who reports  patient to return to [facility] when medically stable to discharge.   - 4:45pm Received voicemail message from [place] and [place] with [facility]. Returned call and left voicemail messages for [name] and [name].   -5:27pm Received message from [name] with [facility] who reports patient can return, will need discharge summary at time of discharge.   TOC will continue to follow.   Expected Discharge Plan: Long Term Acute Care (LTAC) Barriers to Discharge: Continued Medical Work up  Expected Discharge Plan and Services In-house Referral: NA Discharge Planning Services: CM Consult Post Acute Care Choice: Long Term Acute Care (LTAC) Living arrangements for the past 2 months: Post-Acute Facility (LTACH resident)                 DME Arranged: N/A DME Agency: NA       HH Arranged: NA HH Agency: NA         Social Determinants of Health (SDOH) Interventions SDOH Screenings   Food Insecurity: No Food Insecurity (09/11/2023)  Housing: Low Risk  (09/11/2023)  Transportation Needs: No Transportation Needs (09/11/2023)  Utilities: Not At Risk (09/11/2023)  Tobacco Use: Medium Risk (09/08/2023)    Readmission Risk Interventions    09/10/2023    3:49 PM  Readmission Risk Prevention Plan  Transportation Screening Complete  PCP or Specialist Appt within 5-7 Days Complete  Home Care Screening Complete  Medication Review (RN CM) Complete

## 2023-09-11 NOTE — Plan of Care (Signed)

## 2023-09-11 NOTE — Progress Notes (Signed)
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  Patient is not on anticoagulation or antiplatelet.  No recent trauma.  No recent Foley.  In ED, vital stable.  Hgb 12.6 (baseline).  WBC 13.8 (baseline).  CMP without significant finding.  PT/INR within normal.  CT abdomen and pelvis showed large amount of blood products/clot within bladder but no hydronephrosis or nephrolithiasis.  Urology consulted.  Foley catheter inserted and started on CBI.  Hematuria subsided.  H&H remained stable.  Patient underwent cystoscopy, fulguration, clot evacuation and TURB on 11/17.  Hematuria resolved but hemoglobin dropped about 3 g.  Urology following peripherally.   Subjective: Seen and examined earlier this morning.  No major events overnight of this morning.  No complaints but looks sleepy.  Voiding without problem.  Per friend, urine looks dark but no gross hematuria.  Objective: Vitals:   09/11/23 0900 09/11/23 1000 09/11/23 1100 09/11/23 1113  BP: (!) 155/37 (!) 145/32 (!) 179/34   Pulse: 66 64 74   Resp: 16 16 16    Temp:      TempSrc:      SpO2: 100% 100% 99% 100%  Weight:      Height:        Examination:  GENERAL: No apparent distress.  Nontoxic. HEENT: MMM.  Vision and hearing grossly intact.  NECK: Tracheostomy.  On vent via trach. RESP:  No IWOB.  Fair aeration bilaterally. CVS:  RRR. Heart sounds normal.  ABD/GI/GU: BS+. Abd soft.   MSK/EXT: Quadriplegia.  No apparent deformity. SKIN: no apparent skin lesion or wound NEURO: Awake,  alert and oriented appropriately.  Aphonia.  Quadriplegia. PSYCH: Calm. Normal affect.   Procedures:  11/17-cystoscopy with fulguration, clot evacuation and TURB  Microbiology summarized: MRSA PCR screen nonreactive.  Assessment and plan: ABLA due to Gross hematuria: Unclear etiology.  No known history of bladder cancer, recent catheterization, anticoagulation or antiplatelet use.  CT showed blood products/clots in bladder. Coag labs negative.  Low suspicion for infection.  Hematuria resolved.  Hgb dropped about 3 g. -S/p cystoscopy with fulguration, clot evacuation and TURP by Dr. Margo Aye on 11/17. -Continue IV ceftriaxone while in-house. -Bethanechol discontinued by urology.  Defer maintenance of incontinence to urology.  Hypertension/elevated pulse pressure/elevated SBP with low DBP: No history of aortic insufficiency.  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  Normocytic anemia: Stable -Continue monitoring  Quadriplegia-noted.  Anxiety and depression: Stable -Continue home meds  Chronic leukocytosis: Resolved after starting ceftriaxone. -Continue ceftriaxone at least for 5 days while in house.  Allergic rhinitis -Continue antihistamine  Hyponatremia: Resolved.  Hypokalemia/hypophosphatemia -Replenish and recheck  Chronic pain -Continue home medication  Obesity Body mass index is 29.23 kg/m. Nutrition Problem: Inadequate oral intake Etiology: chronic illness Signs/Symptoms: NPO status Interventions: Tube feeding  DVT prophylaxis:  SCDs Start: 09/08/23 0215  Code Status: Full code Family Communication: None at bedside. Level of care: ICU Status is: Inpatient Remains  inpatient appropriate because: Gross hematuria and elevated blood pressure   Final disposition: Likely back to [facility] when medically stable Consultants:  Urology  55 minutes with more than 50% spent in reviewing records, counseling patient/family and coordinating care.   Sch Meds:  Scheduled Meds:  chlorhexidine  5 mL Mouth Rinse 2 times per day   escitalopram  5 mg Per Tube Daily   feeding supplement (PROSource TF20)  60 mL Per Tube Daily   fentaNYL  1 patch Transdermal Q72H   free water  145 mL Per Tube Q4H   furosemide  40 mg Per Tube Daily   hydrocerin   Topical BID   loratadine  10 mg Per Tube Daily   mouth rinse  15 mL Mouth Rinse 4 times per day   Continuous Infusions:  cefTRIAXone (ROCEPHIN)  IV Stopped (09/10/23 1353)   feeding supplement (OSMOLITE 1.5 CAL) 50 mL/hr at 09/11/23 1111   potassium PHOSPHATE IVPB (in mmol) 85 mL/hr at 09/11/23 1111   sodium chloride irrigation     sodium chloride irrigation     PRN Meds:.acetaminophen **OR** acetaminophen, albuterol, HYDROmorphone, labetalol, lip balm, LORazepam, ondansetron (ZOFRAN) IV, mouth rinse  Antimicrobials: Anti-infectives (From admission, onward)    Start     Dose/Rate Route Frequency Ordered Stop   09/10/23 1200  cefTRIAXone (ROCEPHIN) 1 g in sodium chloride 0.9 % 100 mL IVPB        1 g 200 mL/hr over 30 Minutes Intravenous Every 24 hours 09/09/23 1455     09/09/23 1141  sodium chloride 0.9 % with cefTRIAXone (ROCEPHIN) ADS Med       Note to Pharmacy: Yevonne Pax C: cabinet override      09/09/23 1141 09/09/23 1417        I have personally reviewed the following labs and images: CBC: Recent Labs  Lab 09/07/23 2300 09/08/23 0910 09/09/23 0754 09/10/23 0310 09/11/23 0259  WBC 13.8* 14.1* 14.9* 12.6* 10.3  NEUTROABS 9.9* 12.0*  --   --   --   HGB 12.6* 12.5* 12.4* 10.0* 9.2*  HCT 37.4* 38.2* 38.0* 31.7* 29.6*  MCV 93.0 94.6 94.8 97.2 98.7  PLT 332 298 332 299 308   BMP &GFR Recent  Labs  Lab 09/07/23 2300 09/08/23 0910 09/09/23 0754 09/10/23 0310 09/11/23 0259  NA 137 133* 138 140 137  K 3.9 3.9 3.9 3.6 3.4*  CL 101 100 101 104 101  CO2 29 25 26 29 29   GLUCOSE 111* 115* 114* 176* 140*  BUN 25* 22 21 20 19   CREATININE <0.30* <0.30* <0.30* <0.30* <0.30*  CALCIUM 8.4* 8.3* 8.7* 8.5* 8.3*  MG  --  2.3 2.5* 2.3 2.1  PHOS  --   --  2.3* 2.2* 1.9*   CrCl cannot be calculated (This lab value cannot be used to calculate CrCl because it is not a number: <0.30). Liver & Pancreas: Recent Labs  Lab 09/07/23 2300 09/08/23 0910 09/09/23 0754 09/10/23 0310 09/11/23 0259  AST 22 17  --   --   --   ALT 20 20  --   --   --   ALKPHOS 104 100  --   --   --   BILITOT 0.4 0.5  --   --   --   PROT 7.1 7.1  --   --   --   ALBUMIN 2.9* 3.0* 3.0* 2.8* 2.6*  No results for input(s): "LIPASE", "AMYLASE" in the last 168 hours. No results for input(s): "AMMONIA" in the last 168 hours. Diabetic: No results for input(s): "HGBA1C" in the last 72 hours. No results for input(s): "GLUCAP" in the last 168 hours. Cardiac Enzymes: No results for input(s): "CKTOTAL", "CKMB", "CKMBINDEX", "TROPONINI" in the last 168 hours. No results for input(s): "PROBNP" in the last 8760 hours. Coagulation Profile: Recent Labs  Lab 09/07/23 2300  INR 1.0   Thyroid Function Tests: No results for input(s): "TSH", "T4TOTAL", "FREET4", "T3FREE", "THYROIDAB" in the last 72 hours. Lipid Profile: No results for input(s): "CHOL", "HDL", "LDLCALC", "TRIG", "CHOLHDL", "LDLDIRECT" in the last 72 hours. Anemia Panel: No results for input(s): "VITAMINB12", "FOLATE", "FERRITIN", "TIBC", "IRON", "RETICCTPCT" in the last 72 hours. Urine analysis:    Component Value Date/Time   COLORURINE YELLOW 10/07/2022 1349   APPEARANCEUR CLOUDY (A) 10/07/2022 1349   LABSPEC 1.014 10/07/2022 1349   PHURINE 5.0 10/07/2022 1349   GLUCOSEU NEGATIVE 10/07/2022 1349   HGBUR NEGATIVE 10/07/2022 1349   BILIRUBINUR NEGATIVE  10/07/2022 1349   KETONESUR NEGATIVE 10/07/2022 1349   PROTEINUR NEGATIVE 10/07/2022 1349   NITRITE POSITIVE (A) 10/07/2022 1349   LEUKOCYTESUR LARGE (A) 10/07/2022 1349   Sepsis Labs: Invalid input(s): "PROCALCITONIN", "LACTICIDVEN"  Microbiology: Recent Results (from the past 240 hour(s))  MRSA Next Gen by PCR, Nasal     Status: None   Collection Time: 09/08/23  5:20 AM   Specimen: Nasal Mucosa; Nasal Swab  Result Value Ref Range Status   MRSA by PCR Next Gen NOT DETECTED NOT DETECTED Final    Comment: (NOTE) The GeneXpert MRSA Assay (FDA approved for NASAL specimens only), is one component of a comprehensive MRSA colonization surveillance program. It is not intended to diagnose MRSA infection nor to guide or monitor treatment for MRSA infections. Test performance is not FDA approved in patients less than 52 years old. Performed at [facility], [address] [place], [place] [address]     Radiology Studies: No results found.    [place] T. Natallia Stellmach [facility]  If 7PM-7AM, please contact night-coverage [email or web address] 09/11/2023, 12:35 PM

## 2023-09-11 NOTE — Progress Notes (Signed)
   2 Days Post-Op Subjective: Andrew Rivera was doing well this morning.  No acute events overnight.  He reports that he has not had any sensation of urinary pressure.  He does report occasional bladder spasms, though these are not predictable nor are they painful to him.  Objective: Vital signs in last 24 hours: Temp:  [98.1 F (36.7 C)-98.8 F (37.1 C)] 98.1 F (36.7 C) (11/19 0815) Pulse Rate:  [66-89] 66 (11/19 0900) Resp:  [16] 16 (11/19 0900) BP: (125-203)/(33-60) 155/37 (11/19 0900) SpO2:  [96 %-100 %] 100 % (11/19 0900) FiO2 (%):  [35 %] 35 % (11/19 0816) Weight:  [87.2 kg] 87.2 kg (11/19 0500)  Assessment/Plan: # Hematuria-resolved Hemoglobin 9.2 (10.0) Leukocytosis has resolved. To the OR with Dr. Margo Aye 09/09/2023 for clot evacuation and fulguration Continues to BladderScan at least once per shift.  No sign of obstruction.  Minimal PVRs over the course of the day.  Contacted by nursing to notify of an instance of frank hematuria around 6 hours after voiding trial.  He was able to urinate without difficulty over the course of the evening and output has cleared up significantly per nursing's report. Urology will follow along peripherally.  Please call with questions.  Intake/Output from previous day: 11/18 0701 - 11/19 0700 In: 4025.8 [NG/GT:3725.8; IV Piggyback:300] Out: 3800 [Urine:3600; Stool:200]  Intake/Output this shift: Total I/O In: 262.5 [NG/GT:143.3; IV Piggyback:119.1] Out: -   Physical Exam:  General: Alert and oriented CV: No cyanosis Lungs: Ventilated Gu: Foley has been removed.  Some residual blood in urine from nursing report.  No signs of urinary retention on bladder scan.  Lab Results: Recent Labs    09/09/23 0754 09/10/23 0310 09/11/23 0259  HGB 12.4* 10.0* 9.2*  HCT 38.0* 31.7* 29.6*   BMET Recent Labs    09/10/23 0310 09/11/23 0259  NA 140 137  K 3.6 3.4*  CL 104 101  CO2 29 29  GLUCOSE 176* 140*  BUN 20 19  CREATININE <0.30*  <0.30*  CALCIUM 8.5* 8.3*     Studies/Results: No results found.    LOS: 3 days   Elmon Kirschner, NP [facility] Pager: [phone]  09/11/2023, 9:52 AM

## 2023-09-11 NOTE — Plan of Care (Signed)
Updated patient's wife over the phone. 

## 2023-09-11 NOTE — Plan of Care (Signed)
  Problem: Clinical Measurements: Goal: Respiratory complications will improve Outcome: Progressing Goal: Cardiovascular complication will be avoided Outcome: Progressing   Problem: Activity: Goal: Risk for activity intolerance will decrease Outcome: Progressing   Problem: Coping: Goal: Level of anxiety will decrease Outcome: Progressing   

## 2023-09-12 ENCOUNTER — Inpatient Hospital Stay (HOSPITAL_COMMUNITY): Payer: Medicare HMO

## 2023-09-12 DIAGNOSIS — R310 Gross hematuria: Secondary | ICD-10-CM | POA: Diagnosis not present

## 2023-09-12 DIAGNOSIS — J9611 Chronic respiratory failure with hypoxia: Secondary | ICD-10-CM | POA: Diagnosis not present

## 2023-09-12 DIAGNOSIS — R008 Other abnormalities of heart beat: Secondary | ICD-10-CM

## 2023-09-12 DIAGNOSIS — Z930 Tracheostomy status: Secondary | ICD-10-CM | POA: Diagnosis not present

## 2023-09-12 DIAGNOSIS — J301 Allergic rhinitis due to pollen: Secondary | ICD-10-CM | POA: Diagnosis not present

## 2023-09-12 DIAGNOSIS — D72825 Bandemia: Secondary | ICD-10-CM

## 2023-09-12 LAB — CBC
HCT: 32.3 % — ABNORMAL LOW (ref 39.0–52.0)
Hemoglobin: 10.5 g/dL — ABNORMAL LOW (ref 13.0–17.0)
MCH: 31 pg (ref 26.0–34.0)
MCHC: 32.5 g/dL (ref 30.0–36.0)
MCV: 95.3 fL (ref 80.0–100.0)
Platelets: 344 10*3/uL (ref 150–400)
RBC: 3.39 MIL/uL — ABNORMAL LOW (ref 4.22–5.81)
RDW: 14.4 % (ref 11.5–15.5)
WBC: 11.5 10*3/uL — ABNORMAL HIGH (ref 4.0–10.5)
nRBC: 0 % (ref 0.0–0.2)

## 2023-09-12 LAB — RENAL FUNCTION PANEL
Albumin: 2.7 g/dL — ABNORMAL LOW (ref 3.5–5.0)
Anion gap: 10 (ref 5–15)
BUN: 17 mg/dL (ref 8–23)
CO2: 29 mmol/L (ref 22–32)
Calcium: 8.7 mg/dL — ABNORMAL LOW (ref 8.9–10.3)
Chloride: 98 mmol/L (ref 98–111)
Creatinine, Ser: <0.3 mg/dL — ABNORMAL LOW (ref 0.61–1.24)
Glucose, Bld: 130 mg/dL — ABNORMAL HIGH (ref 70–99)
Phosphorus: 2.5 mg/dL (ref 2.5–4.6)
Potassium: 3.8 mmol/L (ref 3.5–5.1)
Sodium: 137 mmol/L (ref 135–145)

## 2023-09-12 LAB — ECHOCARDIOGRAM COMPLETE
Area-P 1/2: 3.94 cm2
Calc EF: 59 %
Height: 68 in
P 1/2 time: 291 ms
S' Lateral: 3.5 cm
Single Plane A2C EF: 60.2 %
Single Plane A4C EF: 59.5 %
Weight: 3047.64 [oz_av]

## 2023-09-12 LAB — MAGNESIUM: Magnesium: 2.2 mg/dL (ref 1.7–2.4)

## 2023-09-12 MED ORDER — OSMOLITE 1.5 CAL PO LIQD: 50.0000 mL/h | ORAL | Status: AC

## 2023-09-12 MED ORDER — AMLODIPINE BESYLATE 10 MG PO TABS: 10.0000 mg | ORAL_TABLET | Freq: Every day | ORAL | Status: AC

## 2023-09-12 NOTE — TOC Transition Note (Addendum)
Transition of Care [facility]) - CM/SW Discharge Note   Patient Details  Name: Andrew Rivera MRN: [id number] Date of Birth: 1951/06/10 Nursing secretary desk Transition of Care [facility]) CM/SW Contact:  Lavenia Atlas, RN Phone Number: 09/12/2023, 12:55 PM   Clinical Narrative:   This RNCM faxed discharge summary to [name] with [facility]. Report can be called to [phone], room #303. PTAR folder with patient chart. Patient will need to be transported via [facility] and bedside RN will need to call to coordinate. Notified Dorathy Daft, RN.  TOC following for discharge needs.    Final next level of care: Long Term Acute Care (LTAC) Barriers to Discharge: No Barriers Identified   Patient Goals and CMS Choice CMS Medicare.gov Compare Post Acute Care list provided to:: Patient Represenative (must comment) (Spouse: [name]) Choice offered to / list presented to : Spouse  Discharge Placement                Patient chooses bed at:  [facility]) Patient to be transferred to facility by: [facility]      Discharge Plan and Services Additional resources added to the After Visit Summary for   In-house Referral: NA Discharge Planning Services: CM Consult Post Acute Care Choice: Long Term Acute Care (LTAC)          DME Arranged: N/A DME Agency: NA       HH Arranged: NA HH Agency: NA        Social Determinants of Health (SDOH) Interventions SDOH Screenings   Food Insecurity: No Food Insecurity (09/11/2023)  Housing: Low Risk  (09/11/2023)  Transportation Needs: No Transportation Needs (09/11/2023)  Utilities: Not At Risk (09/11/2023)  Tobacco Use: Medium Risk (09/08/2023)     Readmission Risk Interventions    09/10/2023    3:49 PM  Readmission Risk Prevention Plan  Transportation Screening Complete  PCP or Specialist Appt within 5-7 Days Complete  Home Care Screening Complete  Medication Review (RN CM) Complete

## 2023-09-12 NOTE — Plan of Care (Signed)

## 2023-09-12 NOTE — Discharge Summary (Signed)
Physician Discharge Summary  Andrew Rivera ZOX:[id number] DOB: January 07, 1951 DOA: 09/07/2023  PCP: Michail Sermon, MD  Admit date: 09/07/2023 Discharge date: 09/12/2023 Admitted From: [facility] Disposition: [facility]ACH/[facility] Recommendations for Outpatient Follow-up:  Patient follow-up with urology as needed Check CMP and CBC in 1 week Started on amlodipine due to elevated SBP.  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  Patient is not on anticoagulation or antiplatelet.  No recent trauma.  No recent Foley.   In ED, vital stable.  Hgb 12.6 (baseline).  WBC 13.8 (baseline).  CMP without significant finding.  PT/INR within normal.  CT abdomen and pelvis showed large amount of blood products/clot within bladder but no hydronephrosis or nephrolithiasis.  Urology consulted.  Foley catheter inserted and started on CBI.   Hematuria subsided. Patient underwent cystoscopy, fulguration, clot evacuation and TURB on 11/17.    H&H remained stable after initial drop.  Received ceftriaxone from 11/17-11/20 due to instrumentation during urologic procedure.  Urology signed off.    Patient had high pulse pressure with elevated SBP and low DBP.  Echocardiogram without significant finding.  Started on amlodipine.  Reassess blood pressure and adjust meds as appropriate  See individual problem list below for more.   Problems addressed during this hospitalization ABLA due to Gross hematuria: Unclear etiology.  No known history of bladder cancer, recent catheterization, anticoagulation or antiplatelet use.  CT showed  blood products/clots in bladder. Coag labs negative.  Low suspicion for infection.  Hematuria resolved.  H&H stable after initial drop. -S/p cystoscopy with fulguration, clot evacuation and TURP by Dr. Margo Aye on 11/17. -Received ceftriaxone from 11/17-11/20 perioperatively -Bethanechol discontinued by urology.  -Outpatient follow-up with urology as needed   Hypertension/elevated pulse pressure/elevated SBP with low DBP: No history of aortic insufficiency.  Echocardiogram without significant finding. 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   Dysphagia with PEG dependence -Tube feed and free water as below   Quadriplegia-noted.   Anxiety and depression: Stable -Continue home meds   Chronic leukocytosis: Improved -Recheck CBC in 1 to 2 weeks   Allergic rhinitis -Continue antihistamine   Hyponatremia: Resolved.   Hypokalemia/hypophosphatemia: Resolved -Replenish and recheck   Chronic pain -Continue home medication   Inadequate oral intake due to n.p.o. status Nutrition Problem: Inadequate oral intake Etiology: chronic illness Signs/Symptoms: NPO status Interventions: Tube feeding    Time spent 35 minutes  Vital signs Vitals:   09/12/23 0800 09/12/23 0810 09/12/23 1029 09/12/23 1121  BP: (!) 199/69  (!) 187/64   Pulse: 85     Temp: 98.7 F (37.1 C)     Resp: 16     Height:      Weight:      SpO2: 98% 98%  98%  TempSrc: Oral     BMI (Calculated):         Discharge exam  GENERAL: No apparent distress.  Nontoxic. HEENT: MMM.  Vision and hearing grossly intact.  NECK: Tracheostomy.  On vent via trach. RESP:  No IWOB.  Fair aeration bilaterally. CVS:  RRR. Heart sounds normal.  ABD/GI/GU: BS+. Abd soft.   MSK/EXT: Quadriplegia.  No apparent deformity. SKIN: no apparent skin lesion or wound NEURO: Awake, alert and oriented appropriately.   Aphonia.  Quadriplegia. PSYCH: Calm. Normal affect.   Discharge Instructions Discharge Instructions     Diet - low sodium heart healthy   Complete by: As directed    Discharge wound care:   Complete by: As directed    Frequent turning, monitoring and dressing as needed   Increase activity slowly   Complete by: As directed       Allergies as of 09/12/2023       Reactions   Chlorhexidine Itching, Rash   Codeine Nausea Only   Oxycodone-acetaminophen Nausea Only   Other reaction(s): Hallucinations        Medication List     STOP taking these medications    bethanechol 10 MG tablet Commonly known as: URECHOLINE   lansoprazole 30 MG capsule Commonly known as: PREVACID       TAKE these medications    acetaminophen 325 MG tablet Commonly known as: TYLENOL Take 975 mg by mouth in the morning and at bedtime.   amLODipine 10 MG tablet Commonly known as: NORVASC Place 1 tablet (10 mg total) into feeding tube daily. Start taking on: September 13, 2023   Calmoseptine 0.44-20.6 % Oint Generic drug: Menthol-Zinc Oxide Apply 1 application topically See admin instructions. Apply thin layer of calmoseptine to sacral area, between brief changes for redness   Carboxymethylcellulose Sodium 1 % Gel Place 1 drop into both eyes in the morning, at noon, and at bedtime.   escitalopram 5 MG tablet Commonly known as: LEXAPRO Take 5 mg by mouth daily.   feeding supplement (OSMOLITE 1.5 CAL) Liqd Place 50 mL/hr into feeding tube continuous.   feeding supplement (PROSource TF20) liquid Place 60 mLs into feeding tube daily.   fentaNYL 25 MCG/HR Commonly known as: DURAGESIC Place 1 patch onto the skin every 3 (three) days.   free water Soln Place 150 mLs into feeding tube every 4 (four) hours.   furosemide 40 MG tablet Commonly known as: LASIX Take 40 mg by mouth daily.   HYDROmorphone 2 MG tablet Commonly known as: DILAUDID Place 0.5 tablets (1 mg total) into feeding  tube every 8 (eight) hours as needed for severe pain.   hydrOXYzine 25 MG tablet Commonly known as: ATARAX Place 25 mg into feeding tube 3 (three) times daily.   ipratropium-albuterol 0.5-2.5 (3) MG/3ML Soln Commonly known as: DUONEB Take 3 mLs by nebulization every 4 (four) hours as needed (respiratory failure).   levocetirizine 5 MG tablet Commonly known as: XYZAL Place 5 mg into feeding tube every evening.   LORazepam 0.5 MG tablet Commonly known as: ATIVAN Place 1 tablet (0.5 mg total) into feeding tube every 12 (twelve) hours as needed for anxiety.   Melatonin 10 MG Tabs Give 10 mg by tube at bedtime.   mouth rinse Liqd solution 15 mLs by Mouth Rinse route in the morning and at bedtime.   mupirocin ointment 2 % Commonly known as: BACTROBAN Place 1 application into the nose 2 (two) times daily.   ondansetron 4 MG disintegrating tablet Commonly known as: ZOFRAN-ODT Take 4 mg by mouth every 8 (eight) hours as needed for nausea or vomiting.   pantoprazole 40 MG injection Commonly known as: PROTONIX Inject 40 mg into the vein daily.   polyethylene glycol 17 g packet Commonly known as: MIRALAX / GLYCOLAX Place 17 g  into feeding tube 2 (two) times daily.   Potassium Chloride 10 % Soln 20 mEq daily.   pregabalin 100 MG capsule Commonly known as: LYRICA Place 1 capsule (100 mg total) into feeding tube 3 (three) times daily.   Triple Antibiotic Plus 1 % Oint Generic drug: Neomy-Bacit-Polymyx-Pramoxine Apply 1 application topically See admin instructions. Apply thin layer to right great toe until healed leave open to air.               Discharge Care Instructions  (From admission, onward)           Start     Ordered   09/12/23 0000  Discharge wound care:       Comments: Frequent turning, monitoring and dressing as needed   09/12/23 1141            Consultations: Urology  Procedures/Studies: 11/17-cystoscopy with fulguration, clot evacuation  and TURB    ECHOCARDIOGRAM COMPLETE  Result Date: 09/12/2023    ECHOCARDIOGRAM REPORT   Patient Name:   BRANDUN ROH Lund Date of Exam: 09/12/2023 Medical Rec #:  [id number]     Height:       68.0 in Accession #:    [id number]    Weight:       190.5 lb Date of Birth:  [birth year]     BSA:          2.002 m Patient Age:    72 years      BP:           163/51 mmHg Patient Gender: M             HR:           83 bpm. Exam Location:  Inpatient Procedure: 2D Echo, Cardiac Doppler and Color Doppler Indications:    R00.8 Other abnormalities of heart beat. Wide pulse pressure  History:        Patient has no prior history of Echocardiogram examinations.                 Signs/Symptoms:Altered Mental Status, Shortness of Breath and                 Dyspnea. Shock. Vent dependent.  Sonographer:    Sheralyn Boatman RDCS Referring Phys: [id number] Almon Hercules  Sonographer Comments: Technically difficult study due to poor echo windows and echo performed with patient supine and on artificial respirator. Patient has quadriplegia and on vent. Images off axis. IMPRESSIONS  1. Left ventricular ejection fraction, by estimation, is 55 to 60%. The left ventricle has normal function. The left ventricle has no regional wall motion abnormalities. The left ventricular internal cavity size was mildly to moderately dilated. There is mild left ventricular hypertrophy. Left ventricular diastolic parameters are consistent with Grade I diastolic dysfunction (impaired relaxation).  2. Right ventricular systolic function is normal. The right ventricular size is normal.  3. The mitral valve is grossly normal. No evidence of mitral valve regurgitation. No evidence of mitral stenosis.  4. The aortic valve is grossly normal. Aortic valve regurgitation is mild to moderate. No aortic stenosis is present.  5. Aortic dilatation noted. There is dilatation of the aortic root, measuring 42 mm. Comparison(s): No prior Echocardiogram. FINDINGS  Left Ventricle: Left  ventricular ejection fraction, by estimation, is 55 to 60%. The left ventricle has normal function. The left ventricle has no regional wall motion abnormalities. The left ventricular internal cavity size was mildly to moderately dilated. There is mild left ventricular hypertrophy.  Left ventricular diastolic parameters are consistent with Grade I diastolic dysfunction (impaired relaxation). Right Ventricle: The right ventricular size is normal. No increase in right ventricular wall thickness. Right ventricular systolic function is normal. Left Atrium: Left atrial size was normal in size. Right Atrium: Right atrial size was normal in size. Pericardium: There is no evidence of pericardial effusion. Presence of epicardial fat layer. Mitral Valve: The mitral valve is grossly normal. No evidence of mitral valve regurgitation. No evidence of mitral valve stenosis. Tricuspid Valve: The tricuspid valve is normal in structure. Tricuspid valve regurgitation is not demonstrated. No evidence of tricuspid stenosis. Aortic Valve: The aortic valve is grossly normal. Aortic valve regurgitation is mild to moderate. Aortic regurgitation PHT measures 291 msec. No aortic stenosis is present. Pulmonic Valve: The pulmonic valve was normal in structure. Pulmonic valve regurgitation is not visualized. No evidence of pulmonic stenosis. Aorta: The aortic root and ascending aorta are structurally normal, with no evidence of dilitation and aortic dilatation noted. There is dilatation of the aortic root, measuring 42 mm. IAS/Shunts: The interatrial septum was not well visualized.  LEFT VENTRICLE PLAX 2D LVIDd:         5.40 cm      Diastology LVIDs:         3.50 cm      LV e' medial:    5.22 cm/s LV PW:         1.30 cm      LV E/e' medial:  9.7 LV IVS:        1.40 cm      LV e' lateral:   10.30 cm/s LVOT diam:     2.40 cm      LV E/e' lateral: 4.9 LV SV:         65 LV SV Index:   33 LVOT Area:     4.52 cm  LV Volumes (MOD) LV vol d, MOD A2C:  110.0 ml LV vol d, MOD A4C: 157.0 ml LV vol s, MOD A2C: 43.8 ml LV vol s, MOD A4C: 63.6 ml LV SV MOD A2C:     66.2 ml LV SV MOD A4C:     157.0 ml LV SV MOD BP:      81.1 ml RIGHT VENTRICLE             IVC RV S prime:     11.60 cm/s  IVC diam: 1.50 cm TAPSE (M-mode): 1.5 cm LEFT ATRIUM             Index        RIGHT ATRIUM           Index LA diam:        3.30 cm 1.65 cm/m   RA Area:     12.10 cm LA Vol (A2C):   22.5 ml 11.24 ml/m  RA Volume:   26.00 ml  12.99 ml/m LA Vol (A4C):   25.0 ml 12.49 ml/m LA Biplane Vol: 23.8 ml 11.89 ml/m  AORTIC VALVE LVOT Vmax:   87.65 cm/s LVOT Vmean:  55.400 cm/s LVOT VTI:    0.144 m AI PHT:      291 msec  AORTA Ao Root diam: 4.20 cm Ao Asc diam:  3.60 cm MITRAL VALVE MV Area (PHT): 3.94 cm    SHUNTS MV Decel Time: 192 msec    Systemic VTI:  0.14 m MV E velocity: 50.87 cm/s  Systemic Diam: 2.40 cm MV A velocity: 67.60 cm/s MV E/A ratio:  0.75 Aditya Sabharwal  Electronically signed by Dorthula Nettles Signature Date/Time: 09/12/2023/10:30:02 AM    Final    DG Chest Port 1 View  Result Date: 09/11/2023 CLINICAL DATA:  Chest tightness, hypoxia EXAM: PORTABLE CHEST 1 VIEW COMPARISON:  10/07/2022, levin 1624 FINDINGS: Single frontal view of the chest demonstrates a stable cardiac silhouette. Bibasilar areas of atelectasis are again noted. No effusion or pneumothorax. No acute bony abnormalities. IMPRESSION: 1. Stable bibasilar atelectasis.  No acute airspace disease. Electronically Signed   By: Sharlet Salina M.D.   On: 09/11/2023 22:40   CT ABDOMEN PELVIS W CONTRAST  Result Date: 09/08/2023 CLINICAL DATA:  Hematuria. EXAM: CT ABDOMEN AND PELVIS WITH CONTRAST TECHNIQUE: Multidetector CT imaging of the abdomen and pelvis was performed using the standard protocol following bolus administration of intravenous contrast. RADIATION DOSE REDUCTION: This exam was performed according to the departmental dose-optimization program which includes automated exposure control, adjustment  of the mA and/or kV according to patient size and/or use of iterative reconstruction technique. CONTRAST:  OMNIPAQUE IOHEXOL 300 MG/ML  SOLN COMPARISON:  CT abdomen pelvis dated 10/07/2022. FINDINGS: Lower chest: Partially visualized bibasilar subpleural consolidative changes with air bronchogram also present on the prior CT and may represent chronic atelectasis or pneumonia. Clinical correlation is recommended. No intra-abdominal free air or free fluid. Hepatobiliary: Multiple subcentimeter hepatic hypodense lesions are too small to characterize. No biliary ductal dilatation. The gallbladder is unremarkable. Pancreas: Unremarkable. No pancreatic ductal dilatation or surrounding inflammatory changes. Spleen: Normal in size without focal abnormality. Adrenals/Urinary Tract: The adrenal glands unremarkable. There is no hydronephrosis on either side. There is symmetric enhancement and excretion of contrast by both kidneys. The visualized ureters appear unremarkable. There is trabeculated and irregular appearance of the bladder wall, likely related to chronic bladder outlet obstruction. Multiple bladder diverticula noted. Large amount of high attenuating content within the bladder consistent with blood product/clot. Underlying bladder mass is not excluded but cannot be evaluated due to presence of blood clot. Further evaluation with cystoscopy is recommended. 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 There is loose stool throughout the colon consistent with diarrheal state. There is sigmoid diverticulosis without active inflammatory changes. There is no bowel obstruction or active inflammation. The appendix is not visualized with certainty. No inflammatory changes identified in the right lower quadrant. Vascular/Lymphatic: Moderate aortoiliac atherosclerotic disease. The IVC is unremarkable. No portal venous gas. There is no adenopathy. Reproductive: The prostate and seminal  vesicles are grossly unremarkable. No pelvic mass. Other: None Musculoskeletal: Osteopenia with severe degenerative changes and lower lumbar laminectomy. No acute osseous pathology. There is fatty atrophy of the paraspinal, abdominal, and pelvic musculature. IMPRESSION: 1. Large amount of blood product/clot within the bladder. Further evaluation with cystoscopy is recommended. 2. No hydronephrosis or nephrolithiasis. 3. Sigmoid diverticulosis. No bowel obstruction. 4. 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 5. Chronic bibasilar subpleural atelectasis or infiltrate. 6.  Aortic Atherosclerosis (ICD10-I70.0). Electronically Signed   By: Elgie Collard M.D.   On: 09/08/2023 00:31       The results of significant diagnostics from this hospitalization (including imaging, microbiology, ancillary and laboratory) are listed below for reference.     Microbiology: Recent Results (from the past 240 hour(s))  MRSA Next Gen by PCR, Nasal     Status: None   Collection Time: 09/08/23  5:20 AM   Specimen: Nasal Mucosa; Nasal Swab  Result Value Ref Range Status   MRSA by PCR Next Gen NOT DETECTED NOT DETECTED  Final    Comment: (NOTE) The GeneXpert MRSA Assay (FDA approved for NASAL specimens only), is one component of a comprehensive MRSA colonization surveillance program. It is not intended to diagnose MRSA infection nor to guide or monitor treatment for MRSA infections. Test performance is not FDA approved in patients less than 25 years old. Performed at [facility], [address] [place], [place] [address]      Labs:  CBC: Recent Labs  Lab 09/07/23 2300 09/08/23 0910 09/09/23 0754 09/10/23 0310 09/11/23 0259 09/12/23 0311  WBC 13.8* 14.1* 14.9* 12.6* 10.3 11.5*  NEUTROABS 9.9* 12.0*  --   --   --   --   HGB 12.6* 12.5* 12.4* 10.0* 9.2* 10.5*  HCT 37.4* 38.2* 38.0* 31.7* 29.6* 32.3*  MCV 93.0 94.6 94.8 97.2 98.7 95.3  PLT 332 298 332 299 308  344   BMP &GFR Recent Labs  Lab 09/08/23 0910 09/09/23 0754 09/10/23 0310 09/11/23 0259 09/12/23 0311  NA 133* 138 140 137 137  K 3.9 3.9 3.6 3.4* 3.8  CL 100 101 104 101 98  CO2 25 26 29 29 29   GLUCOSE 115* 114* 176* 140* 130*  BUN 22 21 20 19 17   CREATININE <0.30* <0.30* <0.30* <0.30* <0.30*  CALCIUM 8.3* 8.7* 8.5* 8.3* 8.7*  MG 2.3 2.5* 2.3 2.1 2.2  PHOS  --  2.3* 2.2* 1.9* 2.5   CrCl cannot be calculated (This lab value cannot be used to calculate CrCl because it is not a number: <0.30). Liver & Pancreas: Recent Labs  Lab 09/07/23 2300 09/08/23 0910 09/09/23 0754 09/10/23 0310 09/11/23 0259 09/12/23 0311  AST 22 17  --   --   --   --   ALT 20 20  --   --   --   --   ALKPHOS 104 100  --   --   --   --   BILITOT 0.4 0.5  --   --   --   --   PROT 7.1 7.1  --   --   --   --   ALBUMIN 2.9* 3.0* 3.0* 2.8* 2.6* 2.7*   No results for input(s): "LIPASE", "AMYLASE" in the last 168 hours. No results for input(s): "AMMONIA" in the last 168 hours. Diabetic: No results for input(s): "HGBA1C" in the last 72 hours. No results for input(s): "GLUCAP" in the last 168 hours. Cardiac Enzymes: No results for input(s): "CKTOTAL", "CKMB", "CKMBINDEX", "TROPONINI" in the last 168 hours. No results for input(s): "PROBNP" in the last 8760 hours. Coagulation Profile: Recent Labs  Lab 09/07/23 2300  INR 1.0   Thyroid Function Tests: No results for input(s): "TSH", "T4TOTAL", "FREET4", "T3FREE", "THYROIDAB" in the last 72 hours. Lipid Profile: No results for input(s): "CHOL", "HDL", "LDLCALC", "TRIG", "CHOLHDL", "LDLDIRECT" in the last 72 hours. Anemia Panel: No results for input(s): "VITAMINB12", "FOLATE", "FERRITIN", "TIBC", "IRON", "RETICCTPCT" in the last 72 hours. Urine analysis:    Component Value Date/Time   COLORURINE YELLOW 10/07/2022 1349   APPEARANCEUR CLOUDY (A) 10/07/2022 1349   LABSPEC 1.014 10/07/2022 1349   PHURINE 5.0 10/07/2022 1349   GLUCOSEU NEGATIVE  10/07/2022 1349   HGBUR NEGATIVE 10/07/2022 1349   BILIRUBINUR NEGATIVE 10/07/2022 1349   KETONESUR NEGATIVE 10/07/2022 1349   PROTEINUR NEGATIVE 10/07/2022 1349   NITRITE POSITIVE (A) 10/07/2022 1349   LEUKOCYTESUR LARGE (A) 10/07/2022 1349   Sepsis Labs: Invalid input(s): "PROCALCITONIN", "LACTICIDVEN"   SIGNED:  Almon Hercules, MD  [facility] 09/12/2023,  11:41 AM

## 2023-09-12 NOTE — Progress Notes (Signed)
  Echocardiogram 2D Echocardiogram has been performed.  Andrew Rivera 09/12/2023, 9:40 AM

## 2023-12-27 ENCOUNTER — Emergency Department (HOSPITAL_COMMUNITY)

## 2023-12-27 ENCOUNTER — Inpatient Hospital Stay (HOSPITAL_COMMUNITY)
Admission: EM | Admit: 2023-12-27 | Discharge: 2024-01-08 | DRG: 207 | Disposition: A | Discharge status: Other Acute Inpatient Hospital | Source: Other Acute Inpatient Hospital | Attending: Family Medicine | Admitting: Family Medicine

## 2023-12-27 ENCOUNTER — Other Ambulatory Visit: Payer: Self-pay

## 2023-12-27 DIAGNOSIS — J9502 Infection of tracheostomy stoma: Principal | ICD-10-CM | POA: Diagnosis present

## 2023-12-27 DIAGNOSIS — F32A Depression, unspecified: Secondary | ICD-10-CM | POA: Diagnosis present

## 2023-12-27 DIAGNOSIS — R0902 Hypoxemia: Secondary | ICD-10-CM

## 2023-12-27 DIAGNOSIS — Z87891 Personal history of nicotine dependence: Secondary | ICD-10-CM | POA: Diagnosis not present

## 2023-12-27 DIAGNOSIS — F411 Generalized anxiety disorder: Secondary | ICD-10-CM | POA: Diagnosis present

## 2023-12-27 DIAGNOSIS — Z885 Allergy status to narcotic agent status: Secondary | ICD-10-CM

## 2023-12-27 DIAGNOSIS — G894 Chronic pain syndrome: Secondary | ICD-10-CM | POA: Diagnosis not present

## 2023-12-27 DIAGNOSIS — A4152 Sepsis due to Pseudomonas: Secondary | ICD-10-CM | POA: Diagnosis present

## 2023-12-27 DIAGNOSIS — K219 Gastro-esophageal reflux disease without esophagitis: Secondary | ICD-10-CM | POA: Diagnosis present

## 2023-12-27 DIAGNOSIS — Z883 Allergy status to other anti-infective agents status: Secondary | ICD-10-CM

## 2023-12-27 DIAGNOSIS — Z888 Allergy status to other drugs, medicaments and biological substances status: Secondary | ICD-10-CM

## 2023-12-27 DIAGNOSIS — J9601 Acute respiratory failure with hypoxia: Secondary | ICD-10-CM | POA: Diagnosis not present

## 2023-12-27 DIAGNOSIS — J9811 Atelectasis: Secondary | ICD-10-CM | POA: Diagnosis not present

## 2023-12-27 DIAGNOSIS — E870 Hyperosmolality and hypernatremia: Secondary | ICD-10-CM | POA: Diagnosis not present

## 2023-12-27 DIAGNOSIS — E669 Obesity, unspecified: Secondary | ICD-10-CM | POA: Diagnosis present

## 2023-12-27 DIAGNOSIS — Z1152 Encounter for screening for COVID-19: Secondary | ICD-10-CM | POA: Diagnosis not present

## 2023-12-27 DIAGNOSIS — Z1612 Extended spectrum beta lactamase (ESBL) resistance: Secondary | ICD-10-CM | POA: Diagnosis not present

## 2023-12-27 DIAGNOSIS — J9382 Other air leak: Secondary | ICD-10-CM | POA: Diagnosis not present

## 2023-12-27 DIAGNOSIS — J189 Pneumonia, unspecified organism: Principal | ICD-10-CM

## 2023-12-27 DIAGNOSIS — A4151 Sepsis due to Escherichia coli [E. coli]: Secondary | ICD-10-CM | POA: Diagnosis not present

## 2023-12-27 DIAGNOSIS — I5033 Acute on chronic diastolic (congestive) heart failure: Secondary | ICD-10-CM | POA: Diagnosis not present

## 2023-12-27 DIAGNOSIS — Z9911 Dependence on respirator [ventilator] status: Secondary | ICD-10-CM

## 2023-12-27 DIAGNOSIS — G8250 Quadriplegia, unspecified: Secondary | ICD-10-CM | POA: Diagnosis present

## 2023-12-27 DIAGNOSIS — R1310 Dysphagia, unspecified: Secondary | ICD-10-CM | POA: Diagnosis not present

## 2023-12-27 DIAGNOSIS — I110 Hypertensive heart disease with heart failure: Secondary | ICD-10-CM | POA: Diagnosis present

## 2023-12-27 DIAGNOSIS — G9341 Metabolic encephalopathy: Secondary | ICD-10-CM | POA: Diagnosis not present

## 2023-12-27 DIAGNOSIS — I252 Old myocardial infarction: Secondary | ICD-10-CM | POA: Diagnosis not present

## 2023-12-27 DIAGNOSIS — Y95 Nosocomial condition: Secondary | ICD-10-CM

## 2023-12-27 DIAGNOSIS — Z79899 Other long term (current) drug therapy: Secondary | ICD-10-CM

## 2023-12-27 DIAGNOSIS — Z1613 Resistance to carbapenem: Secondary | ICD-10-CM | POA: Diagnosis not present

## 2023-12-27 DIAGNOSIS — Z6829 Body mass index (BMI) 29.0-29.9, adult: Secondary | ICD-10-CM

## 2023-12-27 DIAGNOSIS — J9611 Chronic respiratory failure with hypoxia: Secondary | ICD-10-CM | POA: Diagnosis present

## 2023-12-27 DIAGNOSIS — N390 Urinary tract infection, site not specified: Secondary | ICD-10-CM | POA: Diagnosis not present

## 2023-12-27 DIAGNOSIS — Z930 Tracheostomy status: Secondary | ICD-10-CM

## 2023-12-27 DIAGNOSIS — R5381 Other malaise: Secondary | ICD-10-CM | POA: Diagnosis not present

## 2023-12-27 DIAGNOSIS — G9340 Encephalopathy, unspecified: Secondary | ICD-10-CM | POA: Insufficient documentation

## 2023-12-27 LAB — RESP PANEL BY RT-PCR (RSV, FLU A&B, COVID)  RVPGX2
Influenza A by PCR: NEGATIVE
Influenza B by PCR: NEGATIVE
Resp Syncytial Virus by PCR: NEGATIVE
SARS Coronavirus 2 by RT PCR: NEGATIVE

## 2023-12-27 LAB — CBC WITH DIFFERENTIAL/PLATELET
Abs Immature Granulocytes: 0.11 10*3/uL — ABNORMAL HIGH (ref 0.00–0.07)
Basophils Absolute: 0.1 10*3/uL (ref 0.0–0.1)
Basophils Relative: 0 %
Eosinophils Absolute: 0.1 10*3/uL (ref 0.0–0.5)
Eosinophils Relative: 0 %
HCT: 42.4 % (ref 39.0–52.0)
Hemoglobin: 13.6 g/dL (ref 13.0–17.0)
Immature Granulocytes: 1 %
Lymphocytes Relative: 3 %
Lymphs Abs: 0.6 10*3/uL — ABNORMAL LOW (ref 0.7–4.0)
MCH: 28 pg (ref 26.0–34.0)
MCHC: 32.1 g/dL (ref 30.0–36.0)
MCV: 87.4 fL (ref 80.0–100.0)
Monocytes Absolute: 1 10*3/uL (ref 0.1–1.0)
Monocytes Relative: 5 %
Neutro Abs: 18.4 10*3/uL — ABNORMAL HIGH (ref 1.7–7.7)
Neutrophils Relative %: 91 %
Platelets: 290 10*3/uL (ref 150–400)
RBC: 4.85 MIL/uL (ref 4.22–5.81)
RDW: 16.6 % — ABNORMAL HIGH (ref 11.5–15.5)
WBC: 20.1 10*3/uL — ABNORMAL HIGH (ref 4.0–10.5)
nRBC: 0 % (ref 0.0–0.2)

## 2023-12-27 LAB — COMPREHENSIVE METABOLIC PANEL
ALT: 23 U/L (ref 0–44)
AST: 22 U/L (ref 15–41)
Albumin: 3.3 g/dL — ABNORMAL LOW (ref 3.5–5.0)
Alkaline Phosphatase: 130 U/L — ABNORMAL HIGH (ref 38–126)
Anion gap: 11 (ref 5–15)
BUN: 20 mg/dL (ref 8–23)
CO2: 27 mmol/L (ref 22–32)
Calcium: 9.2 mg/dL (ref 8.9–10.3)
Chloride: 101 mmol/L (ref 98–111)
Creatinine, Ser: <0.3 mg/dL — ABNORMAL LOW (ref 0.61–1.24)
Glucose, Bld: 144 mg/dL — ABNORMAL HIGH (ref 70–99)
Potassium: 4.1 mmol/L (ref 3.5–5.1)
Sodium: 139 mmol/L (ref 135–145)
Total Bilirubin: 0.4 mg/dL (ref 0.0–1.2)
Total Protein: 8.3 g/dL — ABNORMAL HIGH (ref 6.5–8.1)

## 2023-12-27 LAB — BRAIN NATRIURETIC PEPTIDE: B Natriuretic Peptide: 60.2 pg/mL (ref 0.0–100.0)

## 2023-12-27 LAB — I-STAT CG4 LACTIC ACID, ED: Lactic Acid, Venous: 0.4 mmol/L — ABNORMAL LOW (ref 0.5–1.9)

## 2023-12-27 MED ORDER — VANCOMYCIN HCL 2000 MG/400ML IV SOLN
2000.0000 mg | Freq: Once | INTRAVENOUS | Status: AC
  Administered 2023-12-27: 2000 mg via INTRAVENOUS
  Filled 2023-12-27: qty 400

## 2023-12-27 MED ORDER — FUROSEMIDE 10 MG/ML IJ SOLN
80.0000 mg | Freq: Once | INTRAMUSCULAR | Status: AC
  Administered 2023-12-27: 80 mg via INTRAVENOUS
  Filled 2023-12-27: qty 8

## 2023-12-27 MED ORDER — PIPERACILLIN-TAZOBACTAM 3.375 G IVPB 30 MIN
3.3750 g | Freq: Once | INTRAVENOUS | Status: AC
  Administered 2023-12-27: 3.375 g via INTRAVENOUS
  Filled 2023-12-27: qty 50

## 2023-12-27 NOTE — ED Notes (Signed)
 [phone] pt wife [name] called for a update.

## 2023-12-27 NOTE — ED Notes (Signed)
 [facility] and talked to RT there and was made aware that patient Bipap setting at the facility is what he is currently on here. PA made aware.

## 2023-12-27 NOTE — ED Notes (Signed)
 Pt was stuck 4 times, was able to collect one set of blood cultures; unable to collect second set, will try again later

## 2023-12-27 NOTE — ED Provider Notes (Signed)
 [place] EMERGENCY DEPARTMENT AT [facility] Provider Note  Arrival date/time:12/27/2023 11:19 PM  HPI/ROS   Andrew Rivera is a 73 y.o. male with PMH significant forchronic hypoxic RF on vent via trach, dysphagia on PEG, quadriplegia, who presents for hypoxia. History is provided by patient and EMS.  Per EMS, facility noted that patient was hypoxic today in the 80s on his ventilator.  EMS endorsed that patient sats were in the 90s on his home vent settings and wrote.  Patient endorses that he was feeling short of breath today. He has had some increased secretions today.  He denies fevers or chills, he denies chest pain.  He denies nausea, vomiting or diarrhea.   A complete ROS was performed with pertinent positives/negatives noted above.   ED Course and Medical Decision Making   I personally reviewed the patient's vitals.  ER Provider interpretation of labs: CBC concerning for leukocytosis to 20, as well as neutrophilia to 18.4 CMP shows no metabolic derangement, AKI, or liver injury BNP is within normal limits.  Respiratory pathogen panel is negative for COVID, RSV Lactic acid is within normal  Assessment/Plan: Patient is presenting today from his SNF due to reports of hypoxia.  Patient home vent settings are FiO2 40%, RR 16, PEEP 5.  Patient was initially able to tolerate his home settings here in the emergency department, however he is now requiring FiO2 100% to maintain sats.  His chest x-ray shows bilateral pleural effusions.  He does have chronic atelectasis however suspect that he does have increased volume versus superimposed pneumonia in the setting of leukocytosis to 20 and neutrophilia 19.4.  He does have history of resistant bacterial collections and so we will cover broadly with vancomycin and Zosyn.  Blood cultures drawn.  Will also give IV Lasix for diuresis in the setting of possible volume overload and hypertension with systolics in the 190's. Patient  BP's improving after Lasix.  Given his high FiO2 requirement, will plan to admit patient to intensivist service.  Disposition:  I discussed the case with intensivist who graciously agreed to admit the patient to their service for continued care.   Clinical Impression:  1. Pneumonia of both lungs due to infectious organism, unspecified part of lung   2. Hypoxia     Rx / DC Orders ED Discharge Orders          Ordered    Respiratory or Resp and Sputum Culture  Status:  Canceled        12/27/23 2240            The plan for this patient was discussed with Dr. Jeraldine Loots, who voiced agreement and who oversaw evaluation and treatment of this patient.   Clinical Complexity A medically appropriate history, review of systems, and physical exam was performed.  Patient's presentation is most consistent with acute presentation with potential threat to life or bodily function.  Medical Decision Making Amount and/or Complexity of Data Reviewed Labs: ordered. Radiology: ordered.  Risk Prescription drug management. Decision regarding hospitalization.    Physical Exam and Medical History   Vitals:   12/27/23 2130 12/27/23 2145 12/27/23 2230 12/27/23 2300  BP: (!) 196/75 (!) 192/74 (!) 184/76 (!) 161/81  Pulse: 85 86 86 95  Resp: 16 16 16 18   Temp:      TempSrc:      SpO2: 98% 98% 99% 99%  Weight:      Height:        Physical Exam Constitutional:  Comments: Lying comfortably in bed  Neck:     Comments: Trach in appropriate position with no surrounding erythema or drainage Cardiovascular:     Rate and Rhythm: Normal rate and regular rhythm.     Pulses: Normal pulses.     Heart sounds: Normal heart sounds.  Pulmonary:     Comments: Clear, mechanical breath sounds Abdominal:     General: Abdomen is flat.     Palpations: Abdomen is soft.     Comments: G-tube in place in left upper quadrant with no surrounding fluctuance or erythema  Skin:    General: Skin is warm  and dry.     Capillary Refill: Capillary refill takes less than 2 seconds.  Neurological:     Mental Status: Mental status is at baseline.     Comments: Patient is alert and oriented x 4     Medical History: Allergies  Allergen Reactions   Chlorhexidine Itching and Rash   Codeine Nausea Only   Oxycodone-Acetaminophen Nausea Only    Other reaction(s): Hallucinations   Past Medical History:  Diagnosis Date   Acute on chronic respiratory failure with hypoxia (HCC)    Allergic rhinitis    Altered mental status, unspecified    Chronic pain syndrome    Critical illness myopathy    Depression    GAD (generalized anxiety disorder)    Internal jugular (IJ) vein thromboembolism, acute, unspecified laterality (HCC)    Septic shock (HCC)     Past Surgical History:  Procedure Laterality Date   CYSTOSCOPY WITH FULGERATION N/A 09/09/2023   Procedure: CYSTOSCOPY WITH FULGERATION; CLOT EVACUATION;  TUR;  Surgeon: Joline Maxcy, MD;  Location: [facility] ORS;  Service: Urology;  Laterality: N/A;   IR REPLC GASTRO/COLONIC TUBE PERCUT W/FLUORO  07/05/2021   No family history on file.  Social History   Tobacco Use   Smoking status: Former    Current packs/day: 0.00    Types: Cigarettes    Quit date: 01/25/1990    Years since quitting: 33.9   Smokeless tobacco: Never  Substance Use Topics   Alcohol use: Not Currently   Drug use: Never    Procedures   If procedures were preformed on this patient, they are listed below:  Procedures   -------- HPI and MDM generated using voice dictation software and may contain dictation errors. Please contact me for any clarification or with any questions.   Cephus Slater, MD Emergency Medicine PGY-2    Caron Presume, MD 12/27/23 2340    Gerhard Munch, MD 12/27/23 [phone]

## 2023-12-27 NOTE — ED Triage Notes (Signed)
 Patient brought in by EMS from [facility]. Staff stated patient O2 sats were low and that patient was altered all day. Per EMS patients stats been 96-99%.

## 2023-12-28 ENCOUNTER — Encounter (HOSPITAL_COMMUNITY): Payer: Self-pay | Admitting: Internal Medicine

## 2023-12-28 DIAGNOSIS — Z930 Tracheostomy status: Secondary | ICD-10-CM | POA: Diagnosis not present

## 2023-12-28 DIAGNOSIS — A4152 Sepsis due to Pseudomonas: Secondary | ICD-10-CM | POA: Diagnosis present

## 2023-12-28 DIAGNOSIS — A4151 Sepsis due to Escherichia coli [E. coli]: Secondary | ICD-10-CM | POA: Diagnosis present

## 2023-12-28 DIAGNOSIS — R000 Tachycardia, unspecified: Secondary | ICD-10-CM | POA: Diagnosis not present

## 2023-12-28 DIAGNOSIS — I110 Hypertensive heart disease with heart failure: Secondary | ICD-10-CM | POA: Diagnosis present

## 2023-12-28 DIAGNOSIS — Z1612 Extended spectrum beta lactamase (ESBL) resistance: Secondary | ICD-10-CM | POA: Diagnosis present

## 2023-12-28 DIAGNOSIS — J9601 Acute respiratory failure with hypoxia: Secondary | ICD-10-CM | POA: Diagnosis present

## 2023-12-28 DIAGNOSIS — E669 Obesity, unspecified: Secondary | ICD-10-CM | POA: Diagnosis present

## 2023-12-28 DIAGNOSIS — G9341 Metabolic encephalopathy: Secondary | ICD-10-CM | POA: Diagnosis present

## 2023-12-28 DIAGNOSIS — R4182 Altered mental status, unspecified: Secondary | ICD-10-CM | POA: Diagnosis not present

## 2023-12-28 DIAGNOSIS — N390 Urinary tract infection, site not specified: Secondary | ICD-10-CM | POA: Diagnosis present

## 2023-12-28 DIAGNOSIS — J9811 Atelectasis: Secondary | ICD-10-CM | POA: Diagnosis present

## 2023-12-28 DIAGNOSIS — R1310 Dysphagia, unspecified: Secondary | ICD-10-CM | POA: Diagnosis present

## 2023-12-28 DIAGNOSIS — Z9911 Dependence on respirator [ventilator] status: Secondary | ICD-10-CM | POA: Diagnosis not present

## 2023-12-28 DIAGNOSIS — B9620 Unspecified Escherichia coli [E. coli] as the cause of diseases classified elsewhere: Secondary | ICD-10-CM | POA: Diagnosis not present

## 2023-12-28 DIAGNOSIS — J9620 Acute and chronic respiratory failure, unspecified whether with hypoxia or hypercapnia: Secondary | ICD-10-CM | POA: Diagnosis not present

## 2023-12-28 DIAGNOSIS — J9502 Infection of tracheostomy stoma: Secondary | ICD-10-CM | POA: Diagnosis present

## 2023-12-28 DIAGNOSIS — Z1152 Encounter for screening for COVID-19: Secondary | ICD-10-CM | POA: Diagnosis not present

## 2023-12-28 DIAGNOSIS — J189 Pneumonia, unspecified organism: Secondary | ICD-10-CM | POA: Diagnosis present

## 2023-12-28 DIAGNOSIS — F32A Depression, unspecified: Secondary | ICD-10-CM | POA: Diagnosis present

## 2023-12-28 DIAGNOSIS — G8250 Quadriplegia, unspecified: Secondary | ICD-10-CM | POA: Diagnosis present

## 2023-12-28 DIAGNOSIS — J90 Pleural effusion, not elsewhere classified: Secondary | ICD-10-CM | POA: Diagnosis not present

## 2023-12-28 DIAGNOSIS — G894 Chronic pain syndrome: Secondary | ICD-10-CM | POA: Diagnosis present

## 2023-12-28 DIAGNOSIS — I5033 Acute on chronic diastolic (congestive) heart failure: Secondary | ICD-10-CM | POA: Insufficient documentation

## 2023-12-28 DIAGNOSIS — I5030 Unspecified diastolic (congestive) heart failure: Secondary | ICD-10-CM | POA: Diagnosis not present

## 2023-12-28 DIAGNOSIS — Z87891 Personal history of nicotine dependence: Secondary | ICD-10-CM | POA: Diagnosis not present

## 2023-12-28 DIAGNOSIS — R0902 Hypoxemia: Secondary | ICD-10-CM | POA: Diagnosis present

## 2023-12-28 DIAGNOSIS — Z1613 Resistance to carbapenem: Secondary | ICD-10-CM | POA: Diagnosis not present

## 2023-12-28 DIAGNOSIS — G9340 Encephalopathy, unspecified: Secondary | ICD-10-CM | POA: Insufficient documentation

## 2023-12-28 DIAGNOSIS — K219 Gastro-esophageal reflux disease without esophagitis: Secondary | ICD-10-CM | POA: Diagnosis present

## 2023-12-28 DIAGNOSIS — Z79899 Other long term (current) drug therapy: Secondary | ICD-10-CM | POA: Diagnosis not present

## 2023-12-28 DIAGNOSIS — R5381 Other malaise: Secondary | ICD-10-CM | POA: Diagnosis present

## 2023-12-28 DIAGNOSIS — J9382 Other air leak: Secondary | ICD-10-CM | POA: Diagnosis present

## 2023-12-28 DIAGNOSIS — E870 Hyperosmolality and hypernatremia: Secondary | ICD-10-CM | POA: Diagnosis not present

## 2023-12-28 LAB — COMPREHENSIVE METABOLIC PANEL
ALT: 26 U/L (ref 0–44)
AST: 27 U/L (ref 15–41)
Albumin: 3.5 g/dL (ref 3.5–5.0)
Alkaline Phosphatase: 129 U/L — ABNORMAL HIGH (ref 38–126)
Anion gap: 13 (ref 5–15)
BUN: 18 mg/dL (ref 8–23)
CO2: 28 mmol/L (ref 22–32)
Calcium: 9.1 mg/dL (ref 8.9–10.3)
Chloride: 99 mmol/L (ref 98–111)
Creatinine, Ser: <0.3 mg/dL — ABNORMAL LOW (ref 0.61–1.24)
Glucose, Bld: 141 mg/dL — ABNORMAL HIGH (ref 70–99)
Potassium: 2.7 mmol/L — CL (ref 3.5–5.1)
Sodium: 140 mmol/L (ref 135–145)
Total Bilirubin: 1.1 mg/dL (ref 0.0–1.2)
Total Protein: 8.4 g/dL — ABNORMAL HIGH (ref 6.5–8.1)

## 2023-12-28 LAB — CBC WITH DIFFERENTIAL/PLATELET
Abs Immature Granulocytes: 0 10*3/uL (ref 0.00–0.07)
Basophils Absolute: 0 10*3/uL (ref 0.0–0.1)
Basophils Relative: 0 %
Eosinophils Absolute: 0 10*3/uL (ref 0.0–0.5)
Eosinophils Relative: 0 %
HCT: 43.3 % (ref 39.0–52.0)
Hemoglobin: 14.1 g/dL (ref 13.0–17.0)
Lymphocytes Relative: 4 %
Lymphs Abs: 1 10*3/uL (ref 0.7–4.0)
MCH: 28.4 pg (ref 26.0–34.0)
MCHC: 32.6 g/dL (ref 30.0–36.0)
MCV: 87.1 fL (ref 80.0–100.0)
Monocytes Absolute: 1.5 10*3/uL — ABNORMAL HIGH (ref 0.1–1.0)
Monocytes Relative: 6 %
Neutro Abs: 23.1 10*3/uL — ABNORMAL HIGH (ref 1.7–7.7)
Neutrophils Relative %: 90 %
Platelets: 310 10*3/uL (ref 150–400)
RBC: 4.97 MIL/uL (ref 4.22–5.81)
RDW: 16.7 % — ABNORMAL HIGH (ref 11.5–15.5)
WBC: 25.7 10*3/uL — ABNORMAL HIGH (ref 4.0–10.5)
nRBC: 0 % (ref 0.0–0.2)
nRBC: 0 /100{WBCs}

## 2023-12-28 LAB — GLUCOSE, CAPILLARY
Glucose-Capillary: 135 mg/dL — ABNORMAL HIGH (ref 70–99)
Glucose-Capillary: 155 mg/dL — ABNORMAL HIGH (ref 70–99)
Glucose-Capillary: 165 mg/dL — ABNORMAL HIGH (ref 70–99)
Glucose-Capillary: 176 mg/dL — ABNORMAL HIGH (ref 70–99)
Glucose-Capillary: 211 mg/dL — ABNORMAL HIGH (ref 70–99)
Glucose-Capillary: 219 mg/dL — ABNORMAL HIGH (ref 70–99)

## 2023-12-28 LAB — URINALYSIS, W/ REFLEX TO CULTURE (INFECTION SUSPECTED)
Bilirubin Urine: NEGATIVE
Glucose, UA: NEGATIVE mg/dL
Ketones, ur: NEGATIVE mg/dL
Nitrite: POSITIVE — AB
Protein, ur: NEGATIVE mg/dL
Specific Gravity, Urine: 1.008 (ref 1.005–1.030)
WBC, UA: >50 WBC/hpf (ref 0–5)
pH: 5 (ref 5.0–8.0)

## 2023-12-28 LAB — HEMOGLOBIN A1C
Hgb A1c MFr Bld: 4.8 % (ref 4.8–5.6)
Mean Plasma Glucose: 91.06 mg/dL

## 2023-12-28 LAB — MRSA NEXT GEN BY PCR, NASAL: MRSA by PCR Next Gen: NOT DETECTED

## 2023-12-28 LAB — TSH: TSH: 1.483 u[IU]/mL (ref 0.350–4.500)

## 2023-12-28 LAB — TROPONIN I (HIGH SENSITIVITY)
Troponin I (High Sensitivity): 6 ng/L (ref <18)
Troponin I (High Sensitivity): 9 ng/L (ref <18)

## 2023-12-28 LAB — MAGNESIUM: Magnesium: 1.9 mg/dL (ref 1.7–2.4)

## 2023-12-28 MED ORDER — MELATONIN 5 MG PO TABS
10.0000 mg | ORAL_TABLET | Freq: Every day | ORAL | Status: DC
  Administered 2023-12-28 – 2024-01-07 (×11): 10 mg
  Filled 2023-12-28 (×11): qty 2

## 2023-12-28 MED ORDER — INSULIN ASPART 100 UNIT/ML IJ SOLN
0.0000 [IU] | Freq: Three times a day (TID) | INTRAMUSCULAR | Status: DC
  Administered 2023-12-28: 2 [IU] via SUBCUTANEOUS
  Administered 2023-12-28: 3 [IU] via SUBCUTANEOUS
  Administered 2023-12-29: 1 [IU] via SUBCUTANEOUS
  Administered 2023-12-29 (×2): 2 [IU] via SUBCUTANEOUS
  Administered 2023-12-30 (×2): 1 [IU] via SUBCUTANEOUS

## 2023-12-28 MED ORDER — HYDRALAZINE HCL 20 MG/ML IJ SOLN
5.0000 mg | INTRAMUSCULAR | Status: DC | PRN
  Administered 2023-12-28: 5 mg via INTRAVENOUS
  Filled 2023-12-28: qty 1

## 2023-12-28 MED ORDER — ORAL CARE MOUTH RINSE: 15.0000 mL | OROMUCOSAL | Status: DC | PRN

## 2023-12-28 MED ORDER — FUROSEMIDE 40 MG PO TABS: 40.0000 mg | ORAL_TABLET | Freq: Every day | ORAL | Status: DC

## 2023-12-28 MED ORDER — POTASSIUM CHLORIDE 20 MEQ PO PACK
40.0000 meq | PACK | ORAL | Status: AC
  Administered 2023-12-28 (×3): 40 meq
  Filled 2023-12-28 (×3): qty 2

## 2023-12-28 MED ORDER — AMLODIPINE BESYLATE 10 MG PO TABS
10.0000 mg | ORAL_TABLET | Freq: Every day | ORAL | Status: DC
  Administered 2023-12-28 – 2024-01-08 (×12): 10 mg
  Filled 2023-12-28 (×12): qty 1

## 2023-12-28 MED ORDER — METOPROLOL TARTRATE 5 MG/5ML IV SOLN
5.0000 mg | INTRAVENOUS | Status: DC | PRN
  Administered 2023-12-28 – 2023-12-29 (×5): 5 mg via INTRAVENOUS
  Filled 2023-12-28 (×5): qty 5

## 2023-12-28 MED ORDER — PROSOURCE TF20 ENFIT COMPATIBL EN LIQD
60.0000 mL | Freq: Every day | ENTERAL | Status: DC
  Administered 2023-12-28 – 2024-01-08 (×12): 60 mL
  Filled 2023-12-28 (×11): qty 60

## 2023-12-28 MED ORDER — PREGABALIN 100 MG PO CAPS
100.0000 mg | ORAL_CAPSULE | Freq: Three times a day (TID) | ORAL | Status: DC
  Administered 2023-12-28 – 2024-01-08 (×34): 100 mg
  Filled 2023-12-28 (×34): qty 1

## 2023-12-28 MED ORDER — SODIUM CHLORIDE 0.9 % IV SOLN
100.0000 mg | Freq: Two times a day (BID) | INTRAVENOUS | Status: AC
  Administered 2023-12-28 – 2023-12-30 (×5): 100 mg via INTRAVENOUS
  Filled 2023-12-28 (×5): qty 100

## 2023-12-28 MED ORDER — PANTOPRAZOLE SODIUM 40 MG IV SOLR
40.0000 mg | Freq: Every day | INTRAVENOUS | Status: DC
  Administered 2023-12-28 – 2024-01-07 (×11): 40 mg via INTRAVENOUS
  Filled 2023-12-28 (×11): qty 10

## 2023-12-28 MED ORDER — JUVEN PO PACK
1.0000 | PACK | Freq: Two times a day (BID) | ORAL | Status: DC
  Administered 2023-12-28 – 2024-01-08 (×22): 1
  Filled 2023-12-28 (×22): qty 1

## 2023-12-28 MED ORDER — ENOXAPARIN SODIUM 40 MG/0.4ML IJ SOSY
40.0000 mg | PREFILLED_SYRINGE | INTRAMUSCULAR | Status: DC
  Administered 2023-12-28 – 2024-01-08 (×12): 40 mg via SUBCUTANEOUS
  Filled 2023-12-28 (×12): qty 0.4

## 2023-12-28 MED ORDER — VANCOMYCIN HCL IN DEXTROSE 1-5 GM/200ML-% IV SOLN
1000.0000 mg | Freq: Two times a day (BID) | INTRAVENOUS | Status: DC
Start: 2023-12-28 — End: 2023-12-28

## 2023-12-28 MED ORDER — CARMEX CLASSIC LIP BALM EX OINT
TOPICAL_OINTMENT | CUTANEOUS | Status: DC | PRN
  Administered 2023-12-28: 1 via TOPICAL
  Filled 2023-12-28: qty 10

## 2023-12-28 MED ORDER — ESCITALOPRAM OXALATE 10 MG PO TABS
5.0000 mg | ORAL_TABLET | Freq: Every day | ORAL | Status: DC
Start: 2023-12-28 — End: 2024-01-08
  Administered 2023-12-28 – 2024-01-08 (×12): 5 mg
  Filled 2023-12-28 (×12): qty 1

## 2023-12-28 MED ORDER — SODIUM CHLORIDE 0.9 % IV SOLN
2.0000 g | Freq: Three times a day (TID) | INTRAVENOUS | Status: DC
  Administered 2023-12-28 – 2023-12-30 (×8): 2 g via INTRAVENOUS
  Filled 2023-12-28 (×8): qty 12.5

## 2023-12-28 MED ORDER — ORAL CARE MOUTH RINSE
15.0000 mL | OROMUCOSAL | Status: DC
  Administered 2023-12-28 – 2024-01-05 (×104): 15 mL via OROMUCOSAL

## 2023-12-28 MED ORDER — OSMOLITE 1.5 CAL PO LIQD
1000.0000 mL | ORAL | Status: DC
  Administered 2023-12-28 – 2024-01-07 (×12): 1000 mL
  Filled 2023-12-28 (×4): qty 1000

## 2023-12-28 NOTE — Progress Notes (Signed)
 Initial Nutrition Assessment  DOCUMENTATION CODES:   Not applicable  INTERVENTION:  TF via PEG tube: Osmolite 1.5 at 62ml/hr ( per day) 60ml ProSource TF20 once daily Provides 1960 kcal, 115g protein, free water daily  Juven BID per tube  NUTRITION DIAGNOSIS:   Increased nutrient needs related to acute illness as evidenced by estimated needs.  GOAL:   Patient will meet greater than or equal to 90% of their needs  MONITOR:   Labs, Weight trends, Vent status, TF tolerance, Skin  REASON FOR ASSESSMENT:   Ventilator    ASSESSMENT:   Pt admitted from [facility] with hypoxia and confusion likely secondary to PNA and acute on chronic diastolic CHF. PMH significant for chronic hypoxic respiratory failure, trach and PEG dependence, quadriplegic, HTN and depression.  Patient is currently on ventilator support via trach. MV: 8.9 L/min Temp (24hrs), Avg:97.4 F (36.3 C), Min:97.1 F (36.2 C), Max:97.9 F (36.6 C)  Assessed pt at bedside. No family/visitors present at time of visit.  Pt unable to provide nutritionally relevant history.  TF infusing via PEG tube. Osmolite 1.5 at 40ml/hr Per home meds pt has been receiving Osmolite 1.5 at 66ml/hr with 1 Prosource TF daily.   Limited weight history on file to review. Weight on 09/12/23 documented to be 86.4 kg.   Current weight: 90.1 kg Will continue to monitor trends throughout admission.   Medications: klor-con, IV abx  Labs: Potassium 2.7 Cr <0.30 CBG's 135  NUTRITION - FOCUSED PHYSICAL EXAM: Flowsheet Row Most Recent Value  Orbital Region Severe depletion  Upper Arm Region No depletion  Thoracic and Lumbar Region No depletion  Buccal Region Moderate depletion  Temple Region Severe depletion  Clavicle Bone Region No depletion  Clavicle and Acromion Bone Region No depletion  Scapular Bone Region Moderate depletion  Dorsal Hand Moderate depletion  [paraplegia]  Patellar Region Moderate depletion   [paraplegia]  Anterior Thigh Region Moderate depletion  Posterior Calf Region Severe depletion  Edema (RD Assessment) Mild  [generalized nonpitting]  Hair Reviewed  Eyes Reviewed  Mouth Reviewed  Skin Reviewed  Nails Reviewed   Diet Order:   Diet Order     None       EDUCATION NEEDS:   No education needs have been identified at this time  Skin:  Skin Assessment: Skin Integrity Issues: Skin Integrity Issues:: Other (Comment) Other: IAD buttocks  Last BM:  3/7  Height:   Ht Readings from Last 1 Encounters:  12/27/23 5\' 9"  (1.753 m)    Weight:   Wt Readings from Last 1 Encounters:  12/28/23 90.1 kg   BMI:  Body mass index is 29.33 kg/m.  Estimated Nutritional Needs:   Kcal:  1800-2000  Protein:  105-120g  Fluid:  >/=1.8L  Drusilla Kanner, RDN, LDN Clinical Nutrition

## 2023-12-28 NOTE — Progress Notes (Signed)
 Brief PCCM progress note   73 yo M chronic trach/vent, LTACH resident, quadriplegia, admitted to [facility] 3/7 -- PNA, AoC diastolic HF, Possible UTI.   Seen by [facility] 12/28/23 overnight and was deemed he did not require  admission to [facility] service, but did require admission to MICU as he is trach/vent.  CXR w bilateral ASD, bilateral effusions  On cefepime doxy   He is on minimal vent settings this morning, PRVC FiO2 40% PEEP 5, rate 16 Vt 550    Chronic hypoxic resp failure Trach/ vent dependence P -Overnight note indicated that PCCM would sign off -- I spoke with attending hospitalist this morning and have clarified that PCCM will follow for trach/vent  -routing trach care, VAP  -abx, diuresis as per primary team    No charge    Tessie Fass MSN, AGACNP-BC [facility] Pulmonary/Critical Care Medicine 12/28/2023, 9:42 AM

## 2023-12-28 NOTE — Progress Notes (Signed)
 Pharmacy Antibiotic Note  Andrew Rivera is a 73 y.o. male admitted on 12/27/2023 with pneumonia. PMH significant for chronic trach and peg from facility. Pharmacy has been consulted for cefepime and vancomycin dosing.  Plan: Cefepime 2g q8h Vancomycin 1000mg  q12h (eAUC 412, Scr used 0.8) F/u renal function, infectious work up and length of therapy Vancomycin levels as needed  Height: 5\' 9"  (175.3 cm) Weight: 95.3 kg (210 lb) IBW/kg (Calculated) : 70.7  Temp (24hrs), Avg:97.2 F (36.2 C), Min:97.1 F (36.2 C), Max:97.2 F (36.2 C)  Recent Labs  Lab 12/27/23 1958 12/27/23 2209  WBC 20.1*  --   CREATININE <0.30*  --   LATICACIDVEN  --  0.4*    CrCl cannot be calculated (This lab value cannot be used to calculate CrCl because it is not a number: <0.30).    Allergies  Allergen Reactions   Chlorhexidine Itching and Rash   Codeine Nausea Only   Oxycodone-Acetaminophen Nausea Only    Other reaction(s): Hallucinations    Antimicrobials this admission: Zosyn 3/6 Vancomycin 3/6 > Doxycycline 3/7 > Cefepime 3/7 >  Microbiology results: 3/6 resp panel: neg 3/6 Bcx:  3/6 ucx: 3/6 resp cx:   Thank you for allowing pharmacy to be a part of this patient's care.  Marja Kays 12/28/2023 2:19 AM

## 2023-12-28 NOTE — H&P (Signed)
 History and Physical    Andrew Rivera VHQ:[id number] DOB: [birth year] DOA: 12/27/2023  Patient coming from: [facility].  Chief Complaint: Hypoxia and confusion.  HPI: Andrew Rivera is a 73 y.o. male with history of chronic hypoxic respiratory failure on the vent dependent/tracheostomy PEG tube feeds, quadriplegic, hypertension, depression was brought to the ER after patient was found to be hypoxic with shortness of breath.  Patient's wife states that over the last few hours patient was getting more short of breath and was brought to the ER.  Patient also appeared to be more confused than baseline.  ED Course: In the ER chest x-ray shows infiltrates and pleural effusion.  Initially patient was on 100% FiO2 on the vent.  Labs show leukocytosis of 20.1 lactic acid was 0.4 BNP 60.2.  Blood pressure was 160/70 temperature 97.2 respiration 18/min.  Patient was started on empiric antibiotics for pneumonia and 80 mg IV Lasix was given.  Critical care was consulted at this time they requested admission under hospitalist.  As per the report patient's mentation is back to baseline.  UA is concerning for UTI.  Review of Systems: As per HPI, rest all negative.   Past Medical History:  Diagnosis Date   Acute on chronic respiratory failure with hypoxia (HCC)    Allergic rhinitis    Altered mental status, unspecified    Chronic pain syndrome    Critical illness myopathy    Depression    GAD (generalized anxiety disorder)    Internal jugular (IJ) vein thromboembolism, acute, unspecified laterality (HCC)    Septic shock (HCC)     Past Surgical History:  Procedure Laterality Date   CYSTOSCOPY WITH FULGERATION N/A 09/09/2023   Procedure: CYSTOSCOPY WITH FULGERATION; CLOT EVACUATION;  TUR;  Surgeon: Joline Maxcy, MD;  Location: [facility] ORS;  Service: Urology;  Laterality: N/A;   IR REPLC GASTRO/COLONIC TUBE PERCUT W/FLUORO  07/05/2021     reports that he quit smoking about 33 years ago. His smoking  use included cigarettes. He has never used smokeless tobacco. He reports that he does not currently use alcohol. He reports that he does not use drugs.  Allergies  Allergen Reactions   Chlorhexidine Itching and Rash   Codeine Nausea Only   Oxycodone-Acetaminophen Nausea Only    Other reaction(s): Hallucinations    History reviewed. No pertinent family history.  Prior to Admission medications   Medication Sig Start Date End Date Taking? Authorizing Provider  acetaminophen (TYLENOL) 325 MG tablet Take 975 mg by mouth in the morning and at bedtime.    [provider]  amLODipine (NORVASC) 10 MG tablet Place 1 tablet (10 mg total) into feeding tube daily. 09/13/23   Almon Hercules, MD  Carboxymethylcellulose Sodium 1 % GEL Place 1 drop into both eyes in the morning, at noon, and at bedtime.    [provider]  escitalopram (LEXAPRO) 5 MG tablet Take 5 mg by mouth daily. 08/18/23   [provider]  fentaNYL (DURAGESIC) 25 MCG/HR Place 1 patch onto the skin every 3 (three) days. 06/09/21   Russella Dar, NP  furosemide (LASIX) 40 MG tablet Take 40 mg by mouth daily. 08/26/23   [provider]  HYDROmorphone (DILAUDID) 2 MG tablet Place 0.5 tablets (1 mg total) into feeding tube every 8 (eight) hours as needed for severe pain. 06/21/22   Belva Agee, MD  hydrOXYzine (ATARAX/VISTARIL) 25 MG tablet Place 25 mg into feeding tube 3 (three) times daily.  [provider]  ipratropium-albuterol (DUONEB) 0.5-2.5 (3) MG/3ML SOLN Take 3 mLs by nebulization every 4 (four) hours as needed (respiratory failure).    [provider]  levocetirizine (XYZAL) 5 MG tablet Place 5 mg into feeding tube every evening.    [provider]  LORazepam (ATIVAN) 0.5 MG tablet Place 1 tablet (0.5 mg total) into feeding tube every 12 (twelve) hours as needed for anxiety. 06/21/22   Belva Agee, MD  Melatonin 10 MG TABS Give 10 mg by tube at  bedtime.    [provider]  Menthol-Zinc Oxide (CALMOSEPTINE) 0.44-20.6 % OINT Apply 1 application topically See admin instructions. Apply thin layer of calmoseptine to sacral area, between brief changes for redness    [provider]  Mouthwashes (MOUTH RINSE) LIQD solution 15 mLs by Mouth Rinse route in the morning and at bedtime. Patient not taking: Reported on 07/05/2021 06/30/21   Eliezer Champagne, NP  mupirocin ointment (BACTROBAN) 2 % Place 1 application into the nose 2 (two) times daily. Patient not taking: Reported on 07/05/2021 06/30/21   Eliezer Champagne, NP  Neomy-Bacit-Polymyx-Pramoxine (TRIPLE ANTIBIOTIC PLUS) 1 % OINT Apply 1 application topically See admin instructions. Apply thin layer to right great toe until healed leave open to air.    [provider]  Nutritional Supplements (FEEDING SUPPLEMENT, OSMOLITE 1.5 CAL,) LIQD Place 50 mL/hr into feeding tube continuous. 09/12/23   Almon Hercules, MD  ondansetron (ZOFRAN-ODT) 4 MG disintegrating tablet Take 4 mg by mouth every 8 (eight) hours as needed for nausea or vomiting.    [provider]  pantoprazole (PROTONIX) 40 MG injection Inject 40 mg into the vein daily. Patient not taking: Reported on 09/08/2023 08/22/23   [provider]  polyethylene glycol (MIRALAX / GLYCOLAX) 17 g packet Place 17 g into feeding tube 2 (two) times daily. 06/09/21   Russella Dar, NP  Potassium Chloride 10 % SOLN 20 mEq daily. 09/03/23   [provider]  pregabalin (LYRICA) 100 MG capsule Place 1 capsule (100 mg total) into feeding tube 3 (three) times daily. 06/21/22   Belva Agee, MD  Protein (FEEDING SUPPLEMENT, PROSOURCE TF20,) liquid Place 60 mLs into feeding tube daily. Patient not taking: Reported on 09/08/2023 06/21/22   Mapp, Gaylyn Cheers, MD  Water For Irrigation, Sterile (FREE WATER) SOLN Place 150 mLs into feeding tube every 4 (four) hours. 06/21/22   Mapp, Gaylyn Cheers, MD  sertraline (ZOLOFT)  50 MG tablet Place 50 mg into feeding tube at bedtime.  04/28/21  [provider]    Physical Exam: Constitutional: Moderately built and nourished. Vitals:   12/27/23 2326 12/28/23 0000 12/28/23 0030 12/28/23 0100  BP:  (!) 165/68 (!) 166/69 (!) 165/69  Pulse:  83 80 81  Resp:  16 16 16   Temp: (!) 97.2 F (36.2 C)     TempSrc: Temporal     SpO2:  99% 99% 99%  Weight:      Height:       Eyes: Anicteric no pallor. ENMT: No discharge from the ears eyes nose or mouth. Neck: No mass felt.  No neck rigidity.  Trach seen. Respiratory: No rhonchi or crepitations. Cardiovascular: S1-S2 heard. Abdomen: Soft nontender PEG tube seen. Musculoskeletal: No edema. Skin: Chronic skin changes. Neurologic: Alert awake does not follow commands but patient is trying to communicate and as per the report this is his baseline. Psychiatric: Has difficulty communicating.   Labs on Admission: I have personally reviewed following labs and imaging  studies  CBC: Recent Labs  Lab 12/27/23 1958  WBC 20.1*  NEUTROABS 18.4*  HGB 13.6  HCT 42.4  MCV 87.4  PLT 290   Basic Metabolic Panel: Recent Labs  Lab 12/27/23 1958  NA 139  K 4.1  CL 101  CO2 27  GLUCOSE 144*  BUN 20  CREATININE <0.30*  CALCIUM 9.2   GFR: CrCl cannot be calculated (This lab value cannot be used to calculate CrCl because it is not a number: <0.30). Liver Function Tests: Recent Labs  Lab 12/27/23 1958  AST 22  ALT 23  ALKPHOS 130*  BILITOT 0.4  PROT 8.3*  ALBUMIN 3.3*   No results for input(s): "LIPASE", "AMYLASE" in the last 168 hours. No results for input(s): "AMMONIA" in the last 168 hours. Coagulation Profile: No results for input(s): "INR", "PROTIME" in the last 168 hours. Cardiac Enzymes: No results for input(s): "CKTOTAL", "CKMB", "CKMBINDEX", "TROPONINI" in the last 168 hours. BNP (last 3 results) No results for input(s): "PROBNP" in the last 8760 hours. HbA1C: No results for input(s):  "HGBA1C" in the last 72 hours. CBG: No results for input(s): "GLUCAP" in the last 168 hours. Lipid Profile: No results for input(s): "CHOL", "HDL", "LDLCALC", "TRIG", "CHOLHDL", "LDLDIRECT" in the last 72 hours. Thyroid Function Tests: No results for input(s): "TSH", "T4TOTAL", "FREET4", "T3FREE", "THYROIDAB" in the last 72 hours. Anemia Panel: No results for input(s): "VITAMINB12", "FOLATE", "FERRITIN", "TIBC", "IRON", "RETICCTPCT" in the last 72 hours. Urine analysis:    Component Value Date/Time   COLORURINE YELLOW 12/27/2023 2310   APPEARANCEUR CLOUDY (A) 12/27/2023 2310   LABSPEC 1.008 12/27/2023 2310   PHURINE 5.0 12/27/2023 2310   GLUCOSEU NEGATIVE 12/27/2023 2310   HGBUR SMALL (A) 12/27/2023 2310   BILIRUBINUR NEGATIVE 12/27/2023 2310   KETONESUR NEGATIVE 12/27/2023 2310   PROTEINUR NEGATIVE 12/27/2023 2310   NITRITE POSITIVE (A) 12/27/2023 2310   LEUKOCYTESUR LARGE (A) 12/27/2023 2310   Sepsis Labs: @LABRCNTIP (procalcitonin:4,lacticidven:4) ) Recent Results (from the past 240 hours)  Resp panel by RT-PCR (RSV, Flu A&B, Covid) Anterior Nasal Swab     Status: None   Collection Time: 12/27/23  8:25 PM   Specimen: Anterior Nasal Swab  Result Value Ref Range Status   SARS Coronavirus 2 by RT PCR NEGATIVE NEGATIVE Final   Influenza A by PCR NEGATIVE NEGATIVE Final   Influenza B by PCR NEGATIVE NEGATIVE Final    Comment: (NOTE) The Xpert Xpress SARS-CoV-2/FLU/RSV plus assay is intended as an aid in the diagnosis of influenza from Nasopharyngeal swab specimens and should not be used as a sole basis for treatment. Nasal washings and aspirates are unacceptable for Xpert Xpress SARS-CoV-2/FLU/RSV testing.  Fact Sheet for Patients: BloggerCourse.com  Fact Sheet for Healthcare Providers: SeriousBroker.it  This test is not yet approved or cleared by the [place] FDA and has been authorized for detection and/or diagnosis  of SARS-CoV-2 by FDA under an Emergency Use Authorization (EUA). This EUA will remain in effect (meaning this test can be used) for the duration of the COVID-19 declaration under Section 564(b)(1) of the Act, 21 U.S.C. section 360bbb-3(b)(1), unless the authorization is terminated or revoked.     Resp Syncytial Virus by PCR NEGATIVE NEGATIVE Final    Comment: (NOTE) Fact Sheet for Patients: BloggerCourse.com  Fact Sheet for Healthcare Providers: SeriousBroker.it  This test is not yet approved or cleared by the [place] FDA and has been authorized for detection and/or diagnosis of SARS-CoV-2 by FDA under an Emergency Use Authorization (EUA). This  EUA will remain in effect (meaning this test can be used) for the duration of the COVID-19 declaration under Section 564(b)(1) of the Act, 21 U.S.C. section 360bbb-3(b)(1), unless the authorization is terminated or revoked.  Performed at [facility], [address], [place], [place] [address]   Blood culture (routine x 2)     Status: None (Preliminary result)   Collection Time: 12/27/23 10:00 PM   Specimen: BLOOD RIGHT ARM  Result Value Ref Range Status   Specimen Description BLOOD RIGHT ARM  Final   Special Requests   Final    BOTTLES DRAWN AEROBIC AND ANAEROBIC Blood Culture adequate volume Performed at [facility], [address] [place], [place] [address]    Culture PENDING  Incomplete   Report Status PENDING  Incomplete     Radiological Exams on Admission: DG Chest Portable 1 View Result Date: 12/27/2023 CLINICAL DATA:  Shortness of breath EXAM: PORTABLE CHEST 1 VIEW COMPARISON:  September 11, 2023 FINDINGS: There is stable tracheostomy tube positioning. The cardiac silhouette is mildly enlarged and unchanged in size. Low lung volumes are noted with mild, diffusely increased lung markings. Mild to moderate severity bibasilar atelectasis and/or infiltrate is seen.  Small bilateral pleural effusions are noted. No pneumothorax is identified. Postoperative changes seen throughout the cervical spine and upper thoracic spine. IMPRESSION: 1. Low lung volumes with mild to moderate severity bibasilar atelectasis and/or infiltrate. 2. Small bilateral pleural effusions. Electronically Signed   By: Aram Candela M.D.   On: 12/27/2023 22:45    EKG: Independently reviewed.  Normal sinus rhythm.  Assessment/Plan Principal Problem:   Acute respiratory failure with hypoxia (HCC) Active Problems:   Chronic hypoxic respiratory failure (HCC)   Ventilator dependent (HCC)   Tracheostomy dependence (HCC)    Acute respiratory failure with hypoxia likely secondary to pneumonia and possible acute on chronic diastolic CHF.  Started on empiric antibiotics.  1 dose of Lasix 80 mg IV was given.  Initially patient was on 100% FiO2 presently on 60%.  Appreciate critical care consult.  Patient has chronic respiratory failure with trach and vent dependent.  Will closely monitor.  Last 2D echo done was 55 to 60% with grade 1 diastolic dysfunction on 09/12/2023.  Further dose of Lasix to be determined based on the response. Acute encephalopathy likely from hypoxia and possible UTI.  Back to baseline as per report.  Continue antibiotics follow urine cultures. Possible UTI on empiric antibiotics.  Follow urine cultures. Hypertension on amlodipine follow blood pressure trends. Chronic PEG tube feeds on Osmolite discussed with pharmacy about dosing. Depression takes Zoloft and Lexapro dose needs to be verified. GERD on PPI.  Since patient has acute respiratory failure with hypoxia and acute encephalopathy with pneumonia and CHF will need close monitoring and more than 2 midnight stay.   DVT prophylaxis: Lovenox. Code Status: Full code confirmed with patient's wife. Family Communication: Patient's wife. Disposition Plan: ICU. Consults called: Critical care. Admission status:  Inpatient.

## 2023-12-28 NOTE — Consult Note (Signed)
 NAME:  LYNELL KUSSMAN, MRN:  [id number], DOB:  Aug 09, 1951, LOS: 0 ADMISSION DATE:  12/27/2023, CONSULTATION DATE:  12/28/23 REFERRING MD:  Gerhard Munch, MD, CHIEF COMPLAINT:  AMS and hypoxia from Vent facility  History of Present Illness:  73 y/o male chronic trach?peg on vent who was sent from facility secondary to AMS low sats.  Although EMS reports sats 96-99% and per ED note mentation baseline (he was mouthing some words in ED).  In the ED his cxr suggested pneumonia and small b/l pleural effusions.  When he was being turned he desated and was put on 100% FIO2.  In the ED he received 80 IVP Lasix and Zosyn and vancomycin.  Blood and sputum cx's pending.  Pertinent  Medical History  Chronic  Significant Hospital Events: Including procedures, antibiotic start and stop dates in addition to other pertinent events   3/7: transferred from Ventilator Facility for AMS and Hypoxia, chronic trach and Peg  Interim History / Subjective:  N/a  Objective   Blood pressure (!) 161/81, pulse 95, temperature (!) 97.2 F (36.2 C), temperature source Temporal, resp. rate 18, height 5\' 9"  (1.753 m), weight 95.3 kg, SpO2 99%.    Vent Mode: PRVC FiO2 (%):  [40 %] 40 % Set Rate:  [16 bmp] 16 bmp Vt Set:  [550 mL] 550 mL PEEP:  [5 cmH20] 5 cmH20 Plateau Pressure:  [19 cmH20] 19 cmH20  No intake or output data in the 24 hours ending 12/28/23 0004 Filed Weights   12/27/23 1918  Weight: 95.3 kg    Examination: General: eyes open not tracking, NAD on Vent HENT: PERRLA no icterus Lungs: diminished bs b/l no wheezes no rhonchi or rales Cardiovascular: reg s1s2 no murmurs or gallops or rubs Abdomen: soft nt nd obese, PEG positive without excoriation Extremities: mild edema no cyanosis or clubbing Neuro: awake, mouthing some words  Resolved Hospital Problem list   N/A  Assessment & Plan:  Acute on chronic respiratory failure-his O2 requirements are trending down, I was able to titrate to 60% from  100% and his O2 sats remained 100%, continue to titrate and can continue PRVC settings otherwise, follow sats and ABGs Pneumonia-VAP likely-continue with broad spectrum antibiotics, follow cultures Pleural effusions-received lasix in ED, monitor Is/Os, Lasix as needed AMS-seems to be at baseline per chart recordings  At this time, the patient does not require ICU level acuity.  Please re-consult if condition changes.  Im to admit to their service.  Best Practice (right click and "Reselect all SmartList Selections" daily)   Per primary team  Labs   CBC: Recent Labs  Lab 12/27/23 1958  WBC 20.1*  NEUTROABS 18.4*  HGB 13.6  HCT 42.4  MCV 87.4  PLT 290    Basic Metabolic Panel: Recent Labs  Lab 12/27/23 1958  NA 139  K 4.1  CL 101  CO2 27  GLUCOSE 144*  BUN 20  CREATININE <0.30*  CALCIUM 9.2   GFR: CrCl cannot be calculated (This lab value cannot be used to calculate CrCl because it is not a number: <0.30). Recent Labs  Lab 12/27/23 1958 12/27/23 2209  WBC 20.1*  --   LATICACIDVEN  --  0.4*    Liver Function Tests: Recent Labs  Lab 12/27/23 1958  AST 22  ALT 23  ALKPHOS 130*  BILITOT 0.4  PROT 8.3*  ALBUMIN 3.3*   No results for input(s): "LIPASE", "AMYLASE" in the last 168 hours. No results for input(s): "AMMONIA" in the last  168 hours.  ABG    Component Value Date/Time   PHART 7.356 06/15/2022 1514   PCO2ART 57.1 (H) 06/15/2022 1514   PO2ART 161 (H) 06/15/2022 1514   HCO3 31.9 (H) 06/15/2022 1514   TCO2 34 (H) 06/15/2022 1514   O2SAT 99 06/15/2022 1514     Coagulation Profile: No results for input(s): "INR", "PROTIME" in the last 168 hours.  Cardiac Enzymes: No results for input(s): "CKTOTAL", "CKMB", "CKMBINDEX", "TROPONINI" in the last 168 hours.  HbA1C: Hgb A1c MFr Bld  Date/Time Value Ref Range Status  06/19/2022 12:59 AM 5.3 4.8 - 5.6 % Final    Comment:    (NOTE) Pre diabetes:          5.7%-6.4%  Diabetes:               >6.4%  Glycemic control for   <7.0% adults with diabetes     CBG: No results for input(s): "GLUCAP" in the last 168 hours.  Review of Systems:   Unable to obtain given trach  Past Medical History:  He,  has a past medical history of Acute on chronic respiratory failure with hypoxia (HCC), Allergic rhinitis, Altered mental status, unspecified, Chronic pain syndrome, Critical illness myopathy, Depression, GAD (generalized anxiety disorder), Internal jugular (IJ) vein thromboembolism, acute, unspecified laterality (HCC), and Septic shock (HCC).   Surgical History:   Past Surgical History:  Procedure Laterality Date   CYSTOSCOPY WITH FULGERATION N/A 09/09/2023   Procedure: CYSTOSCOPY WITH FULGERATION; CLOT EVACUATION;  TUR;  Surgeon: Joline Maxcy, MD;  Location: [facility];  Service: Urology;  Laterality: N/A;   IR REPLC GASTRO/COLONIC TUBE PERCUT W/FLUORO  07/05/2021     Social History:   reports that he quit smoking about 33 years ago. His smoking use included cigarettes. He has never used smokeless tobacco. He reports that he does not currently use alcohol. He reports that he does not use drugs.   Family History:  His family history is not on file.   Allergies Allergies  Allergen Reactions   Chlorhexidine Itching and Rash   Codeine Nausea Only   Oxycodone-Acetaminophen Nausea Only    Other reaction(s): Hallucinations     Home Medications  Prior to Admission medications   Medication Sig Start Date End Date Taking? Authorizing Provider  acetaminophen (TYLENOL) 325 MG tablet Take 975 mg by mouth in the morning and at bedtime.    [provider]  amLODipine (NORVASC) 10 MG tablet Place 1 tablet (10 mg total) into feeding tube daily. 09/13/23   Almon Hercules, MD  Carboxymethylcellulose Sodium 1 % GEL Place 1 drop into both eyes in the morning, at noon, and at bedtime.    [provider]  escitalopram (LEXAPRO) 5 MG tablet Take 5 mg by mouth daily. 08/18/23    [provider]  fentaNYL (DURAGESIC) 25 MCG/HR Place 1 patch onto the skin every 3 (three) days. 06/09/21   Russella Dar, NP  furosemide (LASIX) 40 MG tablet Take 40 mg by mouth daily. 08/26/23   [provider]  HYDROmorphone (DILAUDID) 2 MG tablet Place 0.5 tablets (1 mg total) into feeding tube every 8 (eight) hours as needed for severe pain. 06/21/22   Belva Agee, MD  hydrOXYzine (ATARAX/VISTARIL) 25 MG tablet Place 25 mg into feeding tube 3 (three) times daily.    [provider]  ipratropium-albuterol (DUONEB) 0.5-2.5 (3) MG/3ML SOLN Take 3 mLs by nebulization every 4 (four) hours as needed (respiratory failure).    [provider]  levocetirizine (XYZAL) 5 MG tablet Place 5 mg into feeding tube every evening.    [provider]  LORazepam (ATIVAN) 0.5 MG tablet Place 1 tablet (0.5 mg total) into feeding tube every 12 (twelve) hours as needed for anxiety. 06/21/22   Belva Agee, MD  Melatonin 10 MG TABS Give 10 mg by tube at bedtime.    [provider]  Menthol-Zinc Oxide (CALMOSEPTINE) 0.44-20.6 % OINT Apply 1 application topically See admin instructions. Apply thin layer of calmoseptine to sacral area, between brief changes for redness    [provider]  Mouthwashes (MOUTH RINSE) LIQD solution 15 mLs by Mouth Rinse route in the morning and at bedtime. Patient not taking: Reported on 07/05/2021 06/30/21   Eliezer Champagne, NP  mupirocin ointment (BACTROBAN) 2 % Place 1 application into the nose 2 (two) times daily. Patient not taking: Reported on 07/05/2021 06/30/21   Eliezer Champagne, NP  Neomy-Bacit-Polymyx-Pramoxine (TRIPLE ANTIBIOTIC PLUS) 1 % OINT Apply 1 application topically See admin instructions. Apply thin layer to right great toe until healed leave open to air.    [provider]  Nutritional Supplements (FEEDING SUPPLEMENT, OSMOLITE 1.5 CAL,) LIQD Place 50 mL/hr into feeding tube continuous.  09/12/23   Almon Hercules, MD  ondansetron (ZOFRAN-ODT) 4 MG disintegrating tablet Take 4 mg by mouth every 8 (eight) hours as needed for nausea or vomiting.    [provider]  pantoprazole (PROTONIX) 40 MG injection Inject 40 mg into the vein daily. Patient not taking: Reported on 09/08/2023 08/22/23   [provider]  polyethylene glycol (MIRALAX / GLYCOLAX) 17 g packet Place 17 g into feeding tube 2 (two) times daily. 06/09/21   Russella Dar, NP  Potassium Chloride 10 % SOLN 20 mEq daily. 09/03/23   [provider]  pregabalin (LYRICA) 100 MG capsule Place 1 capsule (100 mg total) into feeding tube 3 (three) times daily. 06/21/22   Belva Agee, MD  Protein (FEEDING SUPPLEMENT, PROSOURCE TF20,) liquid Place 60 mLs into feeding tube daily. Patient not taking: Reported on 09/08/2023 06/21/22   Mapp, Gaylyn Cheers, MD  Water For Irrigation, Sterile (FREE WATER) SOLN Place 150 mLs into feeding tube every 4 (four) hours. 06/21/22   Mapp, Gaylyn Cheers, MD  sertraline (ZOLOFT) 50 MG tablet Place 50 mg into feeding tube at bedtime.  04/28/21  [provider]     Critical care time: 35 min   Case d/w ED attending

## 2023-12-28 NOTE — Progress Notes (Addendum)
 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  Per family over the last couple of hours patient was getting more short of breath and decided to send her to the ED for evaluation.  Chest x-ray shows infiltrates and pleural effusion.  Presented with leukocytosis white blood cell 20, BNP 60, patient started on empiric antibiotics and IV Lasix.  Critical care was consulted and requested admission under [facility]   Assessment & Plan:   Principal Problem:   Acute respiratory failure with hypoxia (HCC) Active Problems:   Chronic hypoxic respiratory failure (HCC)   Ventilator dependent (HCC)   Tracheostomy dependence (HCC)   Depression   Pneumonia   Acute on chronic heart failure with preserved ejection fraction (HFpEF) (HCC)   UTI (urinary tract infection)   Acute encephalopathy   1-Acute on chronic hypoxic respiratory failure in the setting of Pneumonia and possible acute on chronic diastolic heart failure exacerbation: -Patient with history of chronic respiratory failure with trach and vent dependent -Chest x-ray showed infiltrates and pleural effusion -Continue with IV antibiotics, Doxy and Cefepime.  -tracheal aspirate : gram negative rods.  -Received a dose of IV lasix 3/06.  Hold lasix today, K at 2.7  Acute metabolic encephalopathy: -he is alert today, trach and on vent.   Possible UTI: UA with more than 50 WBC.  Follow urine culture.  On IV antibiotics.    Hypertension: On Norvasc.  PRN Metoprolol.   Chronic dysphagia status post chronic PEG tube continue with tube feedings Continue tube feeding.   Diarrhea; check  for C diff.   Depression Resume Lexapro, Lyrica.   GERD: PPI  Quadriplegia; support care   Estimated body mass index is 29.33 kg/m as calculated from the following:   Height as of this encounter: 5\' 9"  (1.753 m).   Weight as of this encounter: 90.1 kg.   DVT prophylaxis: Lovenox Code Status: Full code Family Communication: Andrew Rivera over phone.  Disposition Plan:  Status is: Inpatient Remains inpatient appropriate because: TX for antibiotics.     Consultants:  CCM.   Procedures:  None  Antimicrobials:  Doxycycline and Cefepime.  Received a dose Vancomycin 3/06  Subjective: He is alert, he is able to mouth word yes and no.  Not in any distress.  Had BM--nurse informed, he will be clean.  Objective: Vitals:   12/28/23 1300 12/28/23 1400 12/28/23 1500 12/28/23 1600  BP: (!) 154/60 (!) 161/60 (!) 160/61 (!) 163/54  Pulse: 68 70 70 72  Resp: 16 16 16 18   Temp:   97.8 F (36.6 C)   TempSrc:   Axillary   SpO2: 99% 99% 99% 100%  Weight:      Height:        Intake/Output Summary (Last 24 hours) at 12/28/2023 1644 Last data filed at 12/28/2023 1600 Gross per 24 hour  Intake 1432.99 ml  Output 950 ml  Net 482.99 ml   Filed Weights   12/27/23 1918 12/28/23 0400  Weight: 95.3 kg 90.1 kg    Examination:  General exam: Appears calm and comfortable  Respiratory system: . Respiratory effort normal. BL ronchus Cardiovascular system: S1 & S2 heard, RRR. Gastrointestinal system: Abdomen is nondistended, soft and  nontender. No organomegaly or masses felt. Normal bowel sounds heard. Central nervous system: Alert  Extremities: no edema   Data Reviewed: I have personally reviewed following labs and imaging studies  CBC: Recent Labs  Lab 12/27/23 1958 12/28/23 0420  WBC 20.1* 25.7*  NEUTROABS 18.4* 23.1*  HGB 13.6 14.1  HCT 42.4 43.3  MCV 87.4 87.1  PLT 290 310   Basic Metabolic Panel: Recent Labs  Lab 12/27/23 1958 12/28/23 0420 12/28/23 0847  NA 139 140   --   K 4.1 2.7*  --   CL 101 99  --   CO2 27 28  --   GLUCOSE 144* 141*  --   BUN 20 18  --   CREATININE <0.30* <0.30*  --   CALCIUM 9.2 9.1  --   MG  --   --  1.9   GFR: CrCl cannot be calculated (This lab value cannot be used to calculate CrCl because it is not a number: <0.30). Liver Function Tests: Recent Labs  Lab 12/27/23 1958 12/28/23 0420  AST 22 27  ALT 23 26  ALKPHOS 130* 129*  BILITOT 0.4 1.1  PROT 8.3* 8.4*  ALBUMIN 3.3* 3.5   No results for input(s): "LIPASE", "AMYLASE" in the last 168 hours. No results for input(s): "AMMONIA" in the last 168 hours. Coagulation Profile: No results for input(s): "INR", "PROTIME" in the last 168 hours. Cardiac Enzymes: No results for input(s): "CKTOTAL", "CKMB", "CKMBINDEX", "TROPONINI" in the last 168 hours. BNP (last 3 results) No results for input(s): "PROBNP" in the last 8760 hours. HbA1C: Recent Labs    12/28/23 1550  HGBA1C 4.8   CBG: Recent Labs  Lab 12/28/23 0336 12/28/23 1133 12/28/23 1324  GLUCAP 135* 219* 211*   Lipid Profile: No results for input(s): "CHOL", "HDL", "LDLCALC", "TRIG", "CHOLHDL", "LDLDIRECT" in the last 72 hours. Thyroid Function Tests: Recent Labs    12/28/23 0418  TSH 1.483   Anemia Panel: No results for input(s): "VITAMINB12", "FOLATE", "FERRITIN", "TIBC", "IRON", "RETICCTPCT" in the last 72 hours. Sepsis Labs: Recent Labs  Lab 12/27/23 2209  LATICACIDVEN 0.4*    Recent Results (from the past 240 hours)  Resp panel by RT-PCR (RSV, Flu A&B, Covid) Anterior Nasal Swab     Status: None   Collection Time: 12/27/23  8:25 PM   Specimen: Anterior Nasal Swab  Result Value Ref Range Status   SARS Coronavirus 2 by RT PCR NEGATIVE NEGATIVE Final   Influenza A by PCR NEGATIVE NEGATIVE Final   Influenza B by PCR NEGATIVE NEGATIVE Final    Comment: (NOTE) The Xpert Xpress SARS-CoV-2/FLU/RSV plus assay is intended as an aid in the diagnosis of influenza from Nasopharyngeal swab  specimens and should not be used as a sole basis for treatment. Nasal washings and aspirates are unacceptable for Xpert Xpress SARS-CoV-2/FLU/RSV testing.  Fact Sheet for Patients: BloggerCourse.com  Fact Sheet for Healthcare Providers: SeriousBroker.it  This test is not yet approved or cleared by the [place] FDA and has been authorized for detection and/or diagnosis of SARS-CoV-2 by FDA under an Emergency Use Authorization (EUA). This EUA will remain in effect (meaning this test can be used) for the duration of the COVID-19 declaration under Section 564(b)(1) of the Act, 21 U.S.C. section 360bbb-3(b)(1), unless the authorization is terminated or revoked.     Resp Syncytial Virus by PCR NEGATIVE NEGATIVE Final    Comment: (NOTE) Fact Sheet for Patients: BloggerCourse.com  Fact Sheet for Healthcare Providers: SeriousBroker.it  This test is not  yet approved or cleared by the [place] and has been authorized for detection and/or diagnosis of SARS-CoV-2 by FDA under an Emergency Use Authorization (EUA). This EUA will remain in effect (meaning this test can be used) for the duration of the COVID-19 declaration under Section 564(b)(1) of the Act, 21 U.S.C. section 360bbb-3(b)(1), unless the authorization is terminated or revoked.  Performed at [facility], [address], [place], [place] [address]   Blood culture (routine x 2)     Status: None (Preliminary result)   Collection Time: 12/27/23 10:00 PM   Specimen: BLOOD RIGHT ARM  Result Value Ref Range Status   Specimen Description BLOOD RIGHT ARM  Final   Special Requests   Final    BOTTLES DRAWN AEROBIC AND ANAEROBIC Blood Culture adequate volume   Culture   Final    NO GROWTH < 24 HOURS Performed at [facility], [address] [place], [place] [address]    Report Status PENDING  Incomplete   Blood culture (routine x 2)     Status: None (Preliminary result)   Collection Time: 12/27/23 10:25 PM   Specimen: BLOOD LEFT ARM  Result Value Ref Range Status   Specimen Description BLOOD LEFT ARM  Final   Special Requests   Final    BOTTLES DRAWN AEROBIC AND ANAEROBIC Blood Culture adequate volume   Culture   Final    NO GROWTH < 24 HOURS Performed at [facility], [address], [place], [place] [address]    Report Status PENDING  Incomplete  MRSA Next Gen by PCR, Nasal     Status: None   Collection Time: 12/28/23  3:40 AM   Specimen: Nasal Mucosa; Nasal Swab  Result Value Ref Range Status   MRSA by PCR Next Gen NOT DETECTED NOT DETECTED Final    Comment: (NOTE) The GeneXpert MRSA Assay (FDA approved for NASAL specimens only), is one component of a comprehensive MRSA colonization surveillance program. It is not intended to diagnose MRSA infection nor to guide or monitor treatment for MRSA infections. Test performance is not FDA approved in patients less than 52 years old. Performed at [facility], [address], [place], [place] [address]   Culture, Respiratory w Gram Stain     Status: None (Preliminary result)   Collection Time: 12/28/23  3:51 AM   Specimen: Tracheal Aspirate  Result Value Ref Range Status   Specimen Description TRACHEAL ASPIRATE  Final   Special Requests NONE  Final   Gram Stain   Final    MODERATE WBC PRESENT,BOTH PMN AND MONONUCLEAR FEW GRAM NEGATIVE RODS Performed at [facility], [address], [place], [place] [address]    Culture PENDING  Incomplete   Report Status PENDING  Incomplete         Radiology Studies: DG Chest Portable 1 View Result Date: 12/27/2023 CLINICAL DATA:  Shortness of breath EXAM: PORTABLE CHEST 1 VIEW COMPARISON:  September 11, 2023 FINDINGS: There is stable tracheostomy tube positioning. The cardiac silhouette is mildly enlarged and unchanged in size. Low lung volumes are noted with mild, diffusely  increased lung markings. Mild to moderate severity bibasilar atelectasis and/or infiltrate is seen. Small bilateral pleural effusions are noted. No pneumothorax is identified. Postoperative changes seen throughout the cervical spine and upper thoracic spine. IMPRESSION: 1. Low lung volumes with mild to moderate severity bibasilar atelectasis and/or infiltrate. 2. Small bilateral pleural effusions. Electronically Signed   By: Waylan Rocher  [place] M.D.   On: 12/27/2023 22:45        Scheduled Meds:  amLODipine  10 mg Per Tube Daily   enoxaparin (LOVENOX) injection  40 mg Subcutaneous Q24H   escitalopram  5 mg Per Tube Daily   feeding supplement (PROSource TF20)  60 mL Per Tube Daily   [START ON 12/29/2023] furosemide  40 mg Per Tube Daily   insulin aspart  0-9 Units Subcutaneous TID WC   melatonin  10 mg Per Tube QHS   nutrition supplement (JUVEN)  1 packet Per Tube BID BM   mouth rinse  15 mL Mouth Rinse Q2H   pantoprazole (PROTONIX) IV  40 mg Intravenous QHS   pregabalin  100 mg Per Tube TID   Continuous Infusions:  ceFEPime (MAXIPIME) IV Stopped (12/28/23 1411)   doxycycline (VIBRAMYCIN) IV 100 mg (12/28/23 1604)   feeding supplement (OSMOLITE 1.5 CAL) 50 mL/hr at 12/28/23 1600     LOS: 0 days    Time spent: 35 minutes    Jonpaul Lumm A Lomax Poehler, MD [facility]   If 7PM-7AM, please contact night-coverage [email or web address]  12/28/2023, 4:44 PM

## 2023-12-29 DIAGNOSIS — J9601 Acute respiratory failure with hypoxia: Secondary | ICD-10-CM | POA: Diagnosis not present

## 2023-12-29 LAB — CBC
HCT: 38.1 % — ABNORMAL LOW (ref 39.0–52.0)
Hemoglobin: 12.2 g/dL — ABNORMAL LOW (ref 13.0–17.0)
MCH: 28.2 pg (ref 26.0–34.0)
MCHC: 32 g/dL (ref 30.0–36.0)
MCV: 88 fL (ref 80.0–100.0)
Platelets: 249 10*3/uL (ref 150–400)
RBC: 4.33 MIL/uL (ref 4.22–5.81)
RDW: 17 % — ABNORMAL HIGH (ref 11.5–15.5)
WBC: 21.4 10*3/uL — ABNORMAL HIGH (ref 4.0–10.5)
nRBC: 0 % (ref 0.0–0.2)

## 2023-12-29 LAB — GLUCOSE, CAPILLARY
Glucose-Capillary: 140 mg/dL — ABNORMAL HIGH (ref 70–99)
Glucose-Capillary: 152 mg/dL — ABNORMAL HIGH (ref 70–99)
Glucose-Capillary: 143 mg/dL — ABNORMAL HIGH (ref 70–99)
Glucose-Capillary: 160 mg/dL — ABNORMAL HIGH (ref 70–99)
Glucose-Capillary: 149 mg/dL — ABNORMAL HIGH (ref 70–99)
Glucose-Capillary: 150 mg/dL — ABNORMAL HIGH (ref 70–99)

## 2023-12-29 LAB — BASIC METABOLIC PANEL
Anion gap: 6 (ref 5–15)
BUN: 36 mg/dL — ABNORMAL HIGH (ref 8–23)
CO2: 28 mmol/L (ref 22–32)
Calcium: 9.3 mg/dL (ref 8.9–10.3)
Chloride: 110 mmol/L (ref 98–111)
Creatinine, Ser: <0.3 mg/dL — ABNORMAL LOW (ref 0.61–1.24)
Glucose, Bld: 147 mg/dL — ABNORMAL HIGH (ref 70–99)
Potassium: 4.7 mmol/L (ref 3.5–5.1)
Sodium: 144 mmol/L (ref 135–145)

## 2023-12-29 MED ORDER — FUROSEMIDE 10 MG/ML IJ SOLN
40.0000 mg | Freq: Every day | INTRAMUSCULAR | Status: DC
Start: 2023-12-29 — End: 2023-12-30
  Administered 2023-12-29: 40 mg via INTRAVENOUS
  Filled 2023-12-29: qty 4

## 2023-12-29 MED ORDER — POTASSIUM CHLORIDE 20 MEQ PO PACK
20.0000 meq | PACK | Freq: Once | ORAL | Status: AC
  Administered 2023-12-29: 20 meq via ORAL
  Filled 2023-12-29: qty 1

## 2023-12-29 NOTE — Progress Notes (Signed)
 PROGRESS NOTE    Andrew Rivera  ZOX:[id number] DOB: [birth year] DOA: 12/27/2023 PCP: Michail Sermon, MD   Brief Narrative: 73-year-old with past medical history significant for chronic hypoxic respiratory failure on vent dependent/tracheostomy, PEG tube feeds, quadriplegic, hypertension, depression was brought to the ER after patient was found to be persistently hypoxic and with increased work of breathing.  Per family over the last couple of hours patient was getting more short of breath and decided to send her to the ED for evaluation.  Chest x-ray shows infiltrates and pleural effusion.  Presented with leukocytosis white blood cell 20, BNP 60, patient started on empiric antibiotics and IV Lasix.  Critical care was consulted and requested admission under [facility]   Assessment & Plan:   Principal Problem:   Acute respiratory failure with hypoxia (HCC) Active Problems:   Chronic hypoxic respiratory failure (HCC)   Ventilator dependent (HCC)   Tracheostomy dependence (HCC)   Depression   Pneumonia   Acute on chronic heart failure with preserved ejection fraction (HFpEF) (HCC)   UTI (urinary tract infection)   Acute encephalopathy   1-Acute on chronic hypoxic respiratory failure in the setting of Pneumonia and possible acute on chronic diastolic heart failure exacerbation: -Patient with history of chronic respiratory failure with trach and vent dependent -Chest x-ray showed infiltrates and pleural effusion -Continue with IV antibiotics, Doxy and Cefepime.  -tracheal aspirate : gram negative rods. Pending.  -Received a dose of IV lasix 3/06.  -Resume IV lasix.  Vent setting stable.   Acute metabolic encephalopathy: -he is alert , trach and on vent.   UTI: UA with more than 50 WBC.  Follow urine culture. Growing gram negative rods.  On IV antibiotics.    Hypertension: On Norvasc.  PRN Metoprolol.   Chronic dysphagia status post chronic PEG tube continue with tube  feedings Continue tube feeding.   Diarrhea;  Only had one episode yesterday   Depression Resume Lexapro, Lyrica.   GERD: PPI  Quadriplegia; support care   Estimated body mass index is 29.33 kg/m as calculated from the following:   Height as of this encounter: 5\' 9"  (1.753 m).   Weight as of this encounter: 90.1 kg.   DVT prophylaxis: Lovenox Code Status: Full code Family Communication: Wife over phone. 3/07 Disposition Plan:  Status is: Inpatient Remains inpatient appropriate because: TX for antibiotics.     Consultants:  CCM.   Procedures:  None  Antimicrobials:  Doxycycline and Cefepime.  Received a dose Vancomycin 3/06  Subjective: He is alert, states yes, no Mouth wording.  He has been stable on current vent setting  Objective: Vitals:   12/29/23 1100 12/29/23 1116 12/29/23 1130 12/29/23 1200  BP: (!) 157/54  (!) 158/56 (!) 163/56  Pulse: 78  81 83  Resp: 16  16 16   Temp:  97.6 F (36.4 C)    TempSrc:  Axillary    SpO2: 100%  100% 100%  Weight:      Height:        Intake/Output Summary (Last 24 hours) at 12/29/2023 1241 Last data filed at 12/29/2023 1200 Gross per 24 hour  Intake 2120.14 ml  Output 1620 ml  Net 500.14 ml   Filed Weights   12/27/23 1918 12/28/23 0400  Weight: 95.3 kg 90.1 kg    Examination:  General exam: NAD Respiratory system: Trach, on vent, bilateral rhonchorous Cardiovascular system: S1-S2 regular rhythm or rate Gastrointestinal system: Sounds present, soft nontender nondistended Central nervous system: Alert  Extremities:  no edema   Data Reviewed: I have personally reviewed following labs and imaging studies  CBC: Recent Labs  Lab 12/27/23 1958 12/28/23 0420 12/29/23 0354  WBC 20.1* 25.7* 21.4*  NEUTROABS 18.4* 23.1*  --   HGB 13.6 14.1 12.2*  HCT 42.4 43.3 38.1*  MCV 87.4 87.1 88.0  PLT 290 310 249   Basic Metabolic Panel: Recent Labs  Lab 12/27/23 1958 12/28/23 0420 12/28/23 0847 12/29/23 0354   NA 139 140  --  144  K 4.1 2.7*  --  4.7  CL 101 99  --  110  CO2 27 28  --  28  GLUCOSE 144* 141*  --  147*  BUN 20 18  --  36*  CREATININE <0.30* <0.30*  --  <0.30*  CALCIUM 9.2 9.1  --  9.3  MG  --   --  1.9  --    GFR: CrCl cannot be calculated (This lab value cannot be used to calculate CrCl because it is not a number: <0.30). Liver Function Tests: Recent Labs  Lab 12/27/23 1958 12/28/23 0420  AST 22 27  ALT 23 26  ALKPHOS 130* 129*  BILITOT 0.4 1.1  PROT 8.3* 8.4*  ALBUMIN 3.3* 3.5   No results for input(s): "LIPASE", "AMYLASE" in the last 168 hours. No results for input(s): "AMMONIA" in the last 168 hours. Coagulation Profile: No results for input(s): "INR", "PROTIME" in the last 168 hours. Cardiac Enzymes: No results for input(s): "CKTOTAL", "CKMB", "CKMBINDEX", "TROPONINI" in the last 168 hours. BNP (last 3 results) No results for input(s): "PROBNP" in the last 8760 hours. HbA1C: Recent Labs    12/28/23 1550  HGBA1C 4.8   CBG: Recent Labs  Lab 12/28/23 1931 12/28/23 2344 12/29/23 0320 12/29/23 0815 12/29/23 1112  GLUCAP 165* 155* 140* 152* 143*   Lipid Profile: No results for input(s): "CHOL", "HDL", "LDLCALC", "TRIG", "CHOLHDL", "LDLDIRECT" in the last 72 hours. Thyroid Function Tests: Recent Labs    12/28/23 0418  TSH 1.483   Anemia Panel: No results for input(s): "VITAMINB12", "FOLATE", "FERRITIN", "TIBC", "IRON", "RETICCTPCT" in the last 72 hours. Sepsis Labs: Recent Labs  Lab 12/27/23 2209  LATICACIDVEN 0.4*    Recent Results (from the past 240 hours)  Resp panel by RT-PCR (RSV, Flu A&B, Covid) Anterior Nasal Swab     Status: None   Collection Time: 12/27/23  8:25 PM   Specimen: Anterior Nasal Swab  Result Value Ref Range Status   SARS Coronavirus 2 by RT PCR NEGATIVE NEGATIVE Final   Influenza A by PCR NEGATIVE NEGATIVE Final   Influenza B by PCR NEGATIVE NEGATIVE Final    Comment: (NOTE) The Xpert Xpress SARS-CoV-2/FLU/RSV  plus assay is intended as an aid in the diagnosis of influenza from Nasopharyngeal swab specimens and should not be used as a sole basis for treatment. Nasal washings and aspirates are unacceptable for Xpert Xpress SARS-CoV-2/FLU/RSV testing.  Fact Sheet for Patients: BloggerCourse.com  Fact Sheet for Healthcare Providers: SeriousBroker.it  This test is not yet approved or cleared by the [place] FDA and has been authorized for detection and/or diagnosis of SARS-CoV-2 by FDA under an Emergency Use Authorization (EUA). This EUA will remain in effect (meaning this test can be used) for the duration of the COVID-19 declaration under Section 564(b)(1) of the Act, 21 U.S.C. section 360bbb-3(b)(1), unless the authorization is terminated or revoked.     Resp Syncytial Virus by PCR NEGATIVE NEGATIVE Final    Comment: (NOTE) Fact Sheet for Patients:  BloggerCourse.com  Fact Sheet for Healthcare Providers: SeriousBroker.it  This test is not yet approved or cleared by the [place] FDA and has been authorized for detection and/or diagnosis of SARS-CoV-2 by FDA under an Emergency Use Authorization (EUA). This EUA will remain in effect (meaning this test can be used) for the duration of the COVID-19 declaration under Section 564(b)(1) of the Act, 21 U.S.C. section 360bbb-3(b)(1), unless the authorization is terminated or revoked.  Performed at [facility], [address], [place], [place] [address]   Blood culture (routine x 2)     Status: None (Preliminary result)   Collection Time: 12/27/23 10:00 PM   Specimen: BLOOD RIGHT ARM  Result Value Ref Range Status   Specimen Description BLOOD RIGHT ARM  Final   Special Requests   Final    BOTTLES DRAWN AEROBIC AND ANAEROBIC Blood Culture adequate volume   Culture   Final    NO GROWTH 2 DAYS Performed at [facility]  [facility], [address], [place], [place] [address]    Report Status PENDING  Incomplete  Blood culture (routine x 2)     Status: None (Preliminary result)   Collection Time: 12/27/23 10:25 PM   Specimen: BLOOD LEFT ARM  Result Value Ref Range Status   Specimen Description BLOOD LEFT ARM  Final   Special Requests   Final    BOTTLES DRAWN AEROBIC AND ANAEROBIC Blood Culture adequate volume   Culture   Final    NO GROWTH 2 DAYS Performed at [facility], [address] [place], [place] [address]    Report Status PENDING  Incomplete  Urine Culture     Status: Abnormal (Preliminary result)   Collection Time: 12/27/23 11:10 PM   Specimen: Urine, Random  Result Value Ref Range Status   Specimen Description URINE, RANDOM  Final   Special Requests NONE Reflexed from [id number]  Final   Culture (A)  Final    >=100,000 COLONIES/mL GRAM NEGATIVE RODS IDENTIFICATION TO FOLLOW Performed at [facility], [address], [place], [place] [address]    Report Status PENDING  Incomplete  MRSA Next Gen by PCR, Nasal     Status: None   Collection Time: 12/28/23  3:40 AM   Specimen: Nasal Mucosa; Nasal Swab  Result Value Ref Range Status   MRSA by PCR Next Gen NOT DETECTED NOT DETECTED Final    Comment: (NOTE) The GeneXpert MRSA Assay (FDA approved for NASAL specimens only), is one component of a comprehensive MRSA colonization surveillance program. It is not intended to diagnose MRSA infection nor to guide or monitor treatment for MRSA infections. Test performance is not FDA approved in patients less than 71 years old. Performed at [facility], [address] [place], [place] [address]   Culture, Respiratory w Gram Stain     Status: None (Preliminary result)   Collection Time: 12/28/23  3:51 AM   Specimen: Tracheal Aspirate  Result Value Ref Range Status   Specimen Description TRACHEAL ASPIRATE  Final   Special Requests NONE  Final   Gram Stain   Final    MODERATE WBC PRESENT,BOTH  PMN AND MONONUCLEAR FEW GRAM NEGATIVE RODS    Culture   Final    CULTURE REINCUBATED FOR BETTER GROWTH Performed at [facility], [address], [place], [place] [address]    Report Status PENDING  Incomplete         Radiology Studies: DG Chest Portable  1 View Result Date: 12/27/2023 CLINICAL DATA:  Shortness of breath EXAM: PORTABLE CHEST 1 VIEW COMPARISON:  September 11, 2023 FINDINGS: There is stable tracheostomy tube positioning. The cardiac silhouette is mildly enlarged and unchanged in size. Low lung volumes are noted with mild, diffusely increased lung markings. Mild to moderate severity bibasilar atelectasis and/or infiltrate is seen. Small bilateral pleural effusions are noted. No pneumothorax is identified. Postoperative changes seen throughout the cervical spine and upper thoracic spine. IMPRESSION: 1. Low lung volumes with mild to moderate severity bibasilar atelectasis and/or infiltrate. 2. Small bilateral pleural effusions. Electronically Signed   By: Aram Candela M.D.   On: 12/27/2023 22:45        Scheduled Meds:  amLODipine  10 mg Per Tube Daily   enoxaparin (LOVENOX) injection  40 mg Subcutaneous Q24H   escitalopram  5 mg Per Tube Daily   feeding supplement (PROSource TF20)  60 mL Per Tube Daily   furosemide  40 mg Intravenous Daily   insulin aspart  0-9 Units Subcutaneous TID WC   melatonin  10 mg Per Tube QHS   nutrition supplement (JUVEN)  1 packet Per Tube BID BM   mouth rinse  15 mL Mouth Rinse Q2H   pantoprazole (PROTONIX) IV  40 mg Intravenous QHS   pregabalin  100 mg Per Tube TID   Continuous Infusions:  ceFEPime (MAXIPIME) IV Stopped (12/29/23 0650)   doxycycline (VIBRAMYCIN) IV Stopped (12/29/23 1610)   feeding supplement (OSMOLITE 1.5 CAL) 50 mL/hr at 12/29/23 1200     LOS: 1 day    Time spent: 35 minutes    Mahlet Jergens A Dadrian Ballantine, MD [facility]   If 7PM-7AM, please contact night-coverage [email or web address]  12/29/2023, 12:41  PM

## 2023-12-30 DIAGNOSIS — J9601 Acute respiratory failure with hypoxia: Secondary | ICD-10-CM | POA: Diagnosis not present

## 2023-12-30 LAB — BASIC METABOLIC PANEL
Anion gap: 7 (ref 5–15)
BUN: 43 mg/dL — ABNORMAL HIGH (ref 8–23)
CO2: 30 mmol/L (ref 22–32)
Calcium: 9.8 mg/dL (ref 8.9–10.3)
Chloride: 115 mmol/L — ABNORMAL HIGH (ref 98–111)
Creatinine, Ser: <0.3 mg/dL — ABNORMAL LOW (ref 0.61–1.24)
Glucose, Bld: 178 mg/dL — ABNORMAL HIGH (ref 70–99)
Potassium: 3.8 mmol/L (ref 3.5–5.1)
Sodium: 152 mmol/L — ABNORMAL HIGH (ref 135–145)

## 2023-12-30 LAB — URINE CULTURE: Culture: >=100000 — AB

## 2023-12-30 LAB — CBC
HCT: 39.5 % (ref 39.0–52.0)
Hemoglobin: 12.5 g/dL — ABNORMAL LOW (ref 13.0–17.0)
MCH: 28.3 pg (ref 26.0–34.0)
MCHC: 31.6 g/dL (ref 30.0–36.0)
MCV: 89.4 fL (ref 80.0–100.0)
Platelets: 269 10*3/uL (ref 150–400)
RBC: 4.42 MIL/uL (ref 4.22–5.81)
RDW: 17.6 % — ABNORMAL HIGH (ref 11.5–15.5)
WBC: 17.6 10*3/uL — ABNORMAL HIGH (ref 4.0–10.5)
nRBC: 0 % (ref 0.0–0.2)

## 2023-12-30 LAB — GLUCOSE, CAPILLARY
Glucose-Capillary: 172 mg/dL — ABNORMAL HIGH (ref 70–99)
Glucose-Capillary: 135 mg/dL — ABNORMAL HIGH (ref 70–99)
Glucose-Capillary: 108 mg/dL — ABNORMAL HIGH (ref 70–99)
Glucose-Capillary: 130 mg/dL — ABNORMAL HIGH (ref 70–99)

## 2023-12-30 MED ORDER — FENTANYL 25 MCG/HR TD PT72
1.0000 | MEDICATED_PATCH | TRANSDERMAL | Status: DC
  Administered 2023-12-30 – 2024-01-08 (×4): 1 via TRANSDERMAL
  Filled 2023-12-30 (×4): qty 1

## 2023-12-30 MED ORDER — SODIUM CHLORIDE 0.9 % IV SOLN
1.0000 g | Freq: Three times a day (TID) | INTRAVENOUS | Status: AC
  Administered 2023-12-30 – 2024-01-05 (×20): 1 g via INTRAVENOUS
  Filled 2023-12-30 (×19): qty 20

## 2023-12-30 MED ORDER — GUAIFENESIN 100 MG/5ML PO LIQD
10.0000 mL | Freq: Three times a day (TID) | ORAL | Status: DC
  Administered 2023-12-30 – 2024-01-02 (×9): 10 mL via ORAL
  Filled 2023-12-30 (×9): qty 10

## 2023-12-30 MED ORDER — FREE WATER
200.0000 mL | Status: DC
  Administered 2023-12-30 – 2023-12-31 (×7): 200 mL

## 2023-12-30 MED ORDER — HYDROMORPHONE HCL 2 MG PO TABS
1.0000 mg | ORAL_TABLET | Freq: Three times a day (TID) | ORAL | Status: DC | PRN
  Administered 2024-01-05: 1 mg
  Filled 2023-12-30: qty 1

## 2023-12-30 MED ORDER — LORATADINE 10 MG PO TABS
10.0000 mg | ORAL_TABLET | Freq: Every day | ORAL | Status: DC
  Administered 2023-12-30 – 2024-01-02 (×4): 10 mg via ORAL
  Filled 2023-12-30 (×4): qty 1

## 2023-12-30 MED ORDER — SODIUM CHLORIDE 0.9 % IV SOLN
100.0000 mg | Freq: Two times a day (BID) | INTRAVENOUS | Status: DC
  Administered 2023-12-30 – 2023-12-31 (×3): 100 mg via INTRAVENOUS
  Filled 2023-12-30 (×3): qty 100

## 2023-12-30 MED ORDER — LEVOCETIRIZINE DIHYDROCHLORIDE 5 MG PO TABS: 5.0000 mg | ORAL_TABLET | Freq: Every evening | ORAL | Status: DC

## 2023-12-30 NOTE — Progress Notes (Signed)
 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  Per family over the last couple of hours patient was getting more short of breath and decided to send her to the ED for evaluation.  Chest x-ray shows infiltrates and pleural effusion.  Presented with leukocytosis white blood cell 20, BNP 60, patient started on empiric antibiotics and IV Lasix.  Critical care was consulted and requested admission under [facility]   Assessment & Plan:   Principal Problem:   Acute respiratory failure with hypoxia (HCC) Active Problems:   Chronic hypoxic respiratory failure (HCC)   Ventilator dependent (HCC)   Tracheostomy dependence (HCC)   Depression   Pneumonia   Acute on chronic heart failure with preserved ejection fraction (HFpEF) (HCC)   UTI (urinary tract infection)   Acute encephalopathy   1-Acute on chronic hypoxic respiratory failure in the setting of Pneumonia and possible acute on chronic diastolic heart failure exacerbation: -Patient with history of chronic respiratory failure with trach and vent dependent -Chest x-ray showed infiltrates and pleural effusion -Continue with IV antibiotics, Doxy and Cefepime.  -Tracheal aspirate : gram negative rods. Pending.  -Received a dose of IV lasix 3/06.  -hold lasix due to hypernatremia.  Vent setting stable.   Acute metabolic encephalopathy: -He is alert , trach and on vent.   UTI: UA with more than 50 WBC.  Follow urine culture. Growing E coli On IV antibiotics.    Hypertension: On Norvasc.  PRN Metoprolol.   Chronic dysphagia status post chronic PEG tube continue with tube  feedings Continue tube feeding.   Diarrhea;  Only had one episode yesterday   Depression Resume Lexapro, Lyrica.   Hypernatremia; add free water, hold lasix.  GERD: PPI  Quadriplegia; support care   Estimated body mass index is 29.33 kg/m as calculated from the following:   Height as of this encounter: 5\' 9"  (1.753 m).   Weight as of this encounter: 90.1 kg.   DVT prophylaxis: Lovenox Code Status: Full code Family Communication: Wife over phone. 3/07 will call wife later Disposition Plan:  Status is: Inpatient Remains inpatient appropriate because: TX for antibiotics.     Consultants:  CCM.   Procedures:  None  Antimicrobials:  Doxycycline and Cefepime.  Received a dose Vancomycin 3/06  Subjective: He is alert, Mouth wording. Say yes, no. Denies pain.   Objective: Vitals:   12/30/23 1130 12/30/23 1200 12/30/23 1230 12/30/23 1300  BP: (!) 130/47 (!) 131/46 (!) 132/46 (!) 134/45  Pulse: 85 85 89 90  Resp: (!) 21 18 16  (!) 21  Temp:      TempSrc:      SpO2: 100% 100% 100% 100%  Weight:      Height:        Intake/Output Summary (Last 24 hours) at 12/30/2023 1334 Last data filed at 12/30/2023 1328 Gross per 24 hour  Intake 3135.04 ml  Output 2180 ml  Net 955.04 ml   Filed Weights   12/27/23 1918 12/28/23 0400  Weight: 95.3 kg 90.1 kg    Examination:  General exam: NAD Respiratory system: Trach, on vent, BL ronchus Cardiovascular system: S 1, S 2 RRR Gastrointestinal system: BS present, soft, nt Central nervous  system: Alert, Quadriplegic Extremities: no edema   Data Reviewed: I have personally reviewed following labs and imaging studies  CBC: Recent Labs  Lab 12/27/23 1958 12/28/23 0420 12/29/23 0354 12/30/23 0422  WBC 20.1* 25.7* 21.4* 17.6*  NEUTROABS 18.4* 23.1*  --   --   HGB 13.6 14.1 12.2* 12.5*  HCT 42.4 43.3 38.1* 39.5  MCV 87.4 87.1 88.0 89.4  PLT 290 310 249 269   Basic Metabolic Panel: Recent Labs  Lab 12/27/23 1958  12/28/23 0420 12/28/23 0847 12/29/23 0354 12/30/23 0422  NA 139 140  --  144 152*  K 4.1 2.7*  --  4.7 3.8  CL 101 99  --  110 115*  CO2 27 28  --  28 30  GLUCOSE 144* 141*  --  147* 178*  BUN 20 18  --  36* 43*  CREATININE <0.30* <0.30*  --  <0.30* <0.30*  CALCIUM 9.2 9.1  --  9.3 9.8  MG  --   --  1.9  --   --    GFR: CrCl cannot be calculated (This lab value cannot be used to calculate CrCl because it is not a number: <0.30). Liver Function Tests: Recent Labs  Lab 12/27/23 1958 12/28/23 0420  AST 22 27  ALT 23 26  ALKPHOS 130* 129*  BILITOT 0.4 1.1  PROT 8.3* 8.4*  ALBUMIN 3.3* 3.5   No results for input(s): "LIPASE", "AMYLASE" in the last 168 hours. No results for input(s): "AMMONIA" in the last 168 hours. Coagulation Profile: No results for input(s): "INR", "PROTIME" in the last 168 hours. Cardiac Enzymes: No results for input(s): "CKTOTAL", "CKMB", "CKMBINDEX", "TROPONINI" in the last 168 hours. BNP (last 3 results) No results for input(s): "PROBNP" in the last 8760 hours. HbA1C: Recent Labs    12/28/23 1550  HGBA1C 4.8   CBG: Recent Labs  Lab 12/29/23 1942 12/29/23 2305 12/30/23 0330 12/30/23 0728 12/30/23 1115  GLUCAP 149* 150* 172* 135* 108*   Lipid Profile: No results for input(s): "CHOL", "HDL", "LDLCALC", "TRIG", "CHOLHDL", "LDLDIRECT" in the last 72 hours. Thyroid Function Tests: Recent Labs    12/28/23 0418  TSH 1.483   Anemia Panel: No results for input(s): "VITAMINB12", "FOLATE", "FERRITIN", "TIBC", "IRON", "RETICCTPCT" in the last 72 hours. Sepsis Labs: Recent Labs  Lab 12/27/23 2209  LATICACIDVEN 0.4*    Recent Results (from the past 240 hours)  Resp panel by RT-PCR (RSV, Flu A&B, Covid) Anterior Nasal Swab     Status: None   Collection Time: 12/27/23  8:25 PM   Specimen: Anterior Nasal Swab  Result Value Ref Range Status   SARS Coronavirus 2 by RT PCR NEGATIVE NEGATIVE Final   Influenza A by PCR NEGATIVE NEGATIVE Final    Influenza B by PCR NEGATIVE NEGATIVE Final    Comment: (NOTE) The Xpert Xpress SARS-CoV-2/FLU/RSV plus assay is intended as an aid in the diagnosis of influenza from Nasopharyngeal swab specimens and should not be used as a sole basis for treatment. Nasal washings and aspirates are unacceptable for Xpert Xpress SARS-CoV-2/FLU/RSV testing.  Fact Sheet for Patients: BloggerCourse.com  Fact Sheet for Healthcare Providers: SeriousBroker.it  This test is not yet approved or cleared by the [place] FDA and has been authorized for detection and/or diagnosis of SARS-CoV-2 by FDA under an Emergency Use Authorization (EUA). This EUA will remain in effect (meaning this test can be used) for the duration of the COVID-19 declaration under Section 564(b)(1) of the Act, 21 U.S.C. section 360bbb-3(b)(1), unless  the authorization is terminated or revoked.     Resp Syncytial Virus by PCR NEGATIVE NEGATIVE Final    Comment: (NOTE) Fact Sheet for Patients: BloggerCourse.com  Fact Sheet for Healthcare Providers: SeriousBroker.it  This test is not yet approved or cleared by the [place] FDA and has been authorized for detection and/or diagnosis of SARS-CoV-2 by FDA under an Emergency Use Authorization (EUA). This EUA will remain in effect (meaning this test can be used) for the duration of the COVID-19 declaration under Section 564(b)(1) of the Act, 21 U.S.C. section 360bbb-3(b)(1), unless the authorization is terminated or revoked.  Performed at [facility], [address], [place], [place] [address]   Blood culture (routine x 2)     Status: None (Preliminary result)   Collection Time: 12/27/23 10:00 PM   Specimen: BLOOD RIGHT ARM  Result Value Ref Range Status   Specimen Description BLOOD RIGHT ARM  Final   Special Requests   Final    BOTTLES DRAWN AEROBIC AND ANAEROBIC Blood  Culture adequate volume   Culture   Final    NO GROWTH 3 DAYS Performed at [facility], [address], [place], [place] [address]    Report Status PENDING  Incomplete  Blood culture (routine x 2)     Status: None (Preliminary result)   Collection Time: 12/27/23 10:25 PM   Specimen: BLOOD LEFT ARM  Result Value Ref Range Status   Specimen Description BLOOD LEFT ARM  Final   Special Requests   Final    BOTTLES DRAWN AEROBIC AND ANAEROBIC Blood Culture adequate volume   Culture   Final    NO GROWTH 3 DAYS Performed at [facility], [address], [place], [place] [address]    Report Status PENDING  Incomplete  Urine Culture     Status: Abnormal   Collection Time: 12/27/23 11:10 PM   Specimen: Urine, Random  Result Value Ref Range Status   Specimen Description URINE, RANDOM  Final   Special Requests NONE Reflexed from [id number]  Final   Culture (A)  Final    >=100,000 COLONIES/mL ESCHERICHIA COLI Confirmed Extended Spectrum Beta-Lactamase Producer (ESBL).  In bloodstream infections from ESBL organisms, carbapenems are preferred over piperacillin/tazobactam. They are shown to have a lower risk of mortality.    Report Status 12/30/2023 FINAL  Final   Organism ID, Bacteria ESCHERICHIA COLI (A)  Final      Susceptibility   Escherichia coli - MIC*    AMPICILLIN >=32 RESISTANT Resistant     CEFAZOLIN >=64 RESISTANT Resistant     CEFEPIME >=32 RESISTANT Resistant     CEFTRIAXONE >=64 RESISTANT Resistant     CIPROFLOXACIN >=4 RESISTANT Resistant     GENTAMICIN <=1 SENSITIVE Sensitive     IMIPENEM <=0.25 SENSITIVE Sensitive     NITROFURANTOIN <=16 SENSITIVE Sensitive     TRIMETH/SULFA <=20 SENSITIVE Sensitive     AMPICILLIN/SULBACTAM >=32 RESISTANT Resistant     PIP/TAZO <=4 SENSITIVE Sensitive ug/mL    * >=100,000 COLONIES/mL ESCHERICHIA COLI  MRSA Next Gen by PCR, Nasal     Status: None   Collection Time: 12/28/23  3:40 AM   Specimen: Nasal Mucosa; Nasal Swab  Result  Value Ref Range Status   MRSA by PCR Next Gen NOT DETECTED NOT DETECTED Final    Comment: (NOTE) The GeneXpert MRSA Assay (FDA approved for NASAL specimens only), is one component of a comprehensive MRSA colonization surveillance program. It is not intended to diagnose  MRSA infection nor to guide or monitor treatment for MRSA infections. Test performance is not FDA approved in patients less than 78 years old. Performed at [facility], [address], [place], [place] [address]   Culture, Respiratory w Gram Stain     Status: None (Preliminary result)   Collection Time: 12/28/23  3:51 AM   Specimen: Tracheal Aspirate  Result Value Ref Range Status   Specimen Description TRACHEAL ASPIRATE  Final   Special Requests NONE  Final   Gram Stain   Final    MODERATE WBC PRESENT,BOTH PMN AND MONONUCLEAR FEW GRAM NEGATIVE RODS    Culture   Final    CULTURE REINCUBATED FOR BETTER GROWTH Performed at [facility], [address], [place], [place] [address]    Report Status PENDING  Incomplete         Radiology Studies: No results found.       Scheduled Meds:  amLODipine  10 mg Per Tube Daily   enoxaparin (LOVENOX) injection  40 mg Subcutaneous Q24H   escitalopram  5 mg Per Tube Daily   feeding supplement (PROSource TF20)  60 mL Per Tube Daily   fentaNYL  1 patch Transdermal Q72H   free water  200 mL Per Tube Q4H   guaiFENesin  10 mL Oral TID   insulin aspart  0-9 Units Subcutaneous TID WC   loratadine  10 mg Oral Daily   melatonin  10 mg Per Tube QHS   nutrition supplement (JUVEN)  1 packet Per Tube BID BM   mouth rinse  15 mL Mouth Rinse Q2H   pantoprazole (PROTONIX) IV  40 mg Intravenous QHS   pregabalin  100 mg Per Tube TID   Continuous Infusions:  ceFEPime (MAXIPIME) IV 2 g (12/30/23 1326)   doxycycline (VIBRAMYCIN) IV Stopped (12/30/23 1027)   feeding supplement (OSMOLITE 1.5 CAL) 50 mL/hr at 12/30/23 1300     LOS: 2 days    Time spent: 35  minutes    Jameka Ivie A Millissa Deese, MD [facility]   If 7PM-7AM, please contact night-coverage [email or web address]  12/30/2023, 1:34 PM

## 2023-12-30 NOTE — Plan of Care (Signed)

## 2023-12-31 DIAGNOSIS — I5030 Unspecified diastolic (congestive) heart failure: Secondary | ICD-10-CM | POA: Diagnosis not present

## 2023-12-31 DIAGNOSIS — J90 Pleural effusion, not elsewhere classified: Secondary | ICD-10-CM

## 2023-12-31 DIAGNOSIS — J189 Pneumonia, unspecified organism: Secondary | ICD-10-CM

## 2023-12-31 DIAGNOSIS — R4182 Altered mental status, unspecified: Secondary | ICD-10-CM

## 2023-12-31 DIAGNOSIS — J9601 Acute respiratory failure with hypoxia: Secondary | ICD-10-CM | POA: Diagnosis not present

## 2023-12-31 DIAGNOSIS — R739 Hyperglycemia, unspecified: Secondary | ICD-10-CM

## 2023-12-31 DIAGNOSIS — N390 Urinary tract infection, site not specified: Secondary | ICD-10-CM

## 2023-12-31 DIAGNOSIS — J9620 Acute and chronic respiratory failure, unspecified whether with hypoxia or hypercapnia: Secondary | ICD-10-CM

## 2023-12-31 LAB — BASIC METABOLIC PANEL
Anion gap: 5 (ref 5–15)
Anion gap: 8 (ref 5–15)
BUN: 51 mg/dL — ABNORMAL HIGH (ref 8–23)
BUN: 42 mg/dL — ABNORMAL HIGH (ref 8–23)
CO2: 34 mmol/L — ABNORMAL HIGH (ref 22–32)
CO2: 31 mmol/L (ref 22–32)
Calcium: 9.7 mg/dL (ref 8.9–10.3)
Calcium: 9.5 mg/dL (ref 8.9–10.3)
Chloride: 117 mmol/L — ABNORMAL HIGH (ref 98–111)
Chloride: 116 mmol/L — ABNORMAL HIGH (ref 98–111)
Creatinine, Ser: <0.3 mg/dL — ABNORMAL LOW (ref 0.61–1.24)
Creatinine, Ser: <0.3 mg/dL — ABNORMAL LOW (ref 0.61–1.24)
Glucose, Bld: 126 mg/dL — ABNORMAL HIGH (ref 70–99)
Glucose, Bld: 174 mg/dL — ABNORMAL HIGH (ref 70–99)
Potassium: 4.2 mmol/L (ref 3.5–5.1)
Potassium: 4.2 mmol/L (ref 3.5–5.1)
Sodium: 156 mmol/L — ABNORMAL HIGH (ref 135–145)
Sodium: 155 mmol/L — ABNORMAL HIGH (ref 135–145)

## 2023-12-31 LAB — CBC
HCT: 39.2 % (ref 39.0–52.0)
Hemoglobin: 11.9 g/dL — ABNORMAL LOW (ref 13.0–17.0)
MCH: 27.9 pg (ref 26.0–34.0)
MCHC: 30.4 g/dL (ref 30.0–36.0)
MCV: 92 fL (ref 80.0–100.0)
Platelets: 253 10*3/uL (ref 150–400)
RBC: 4.26 MIL/uL (ref 4.22–5.81)
RDW: 18.3 % — ABNORMAL HIGH (ref 11.5–15.5)
WBC: 12.5 10*3/uL — ABNORMAL HIGH (ref 4.0–10.5)
nRBC: 0 % (ref 0.0–0.2)

## 2023-12-31 LAB — GLUCOSE, CAPILLARY
Glucose-Capillary: 126 mg/dL — ABNORMAL HIGH (ref 70–99)
Glucose-Capillary: 131 mg/dL — ABNORMAL HIGH (ref 70–99)
Glucose-Capillary: 136 mg/dL — ABNORMAL HIGH (ref 70–99)
Glucose-Capillary: 138 mg/dL — ABNORMAL HIGH (ref 70–99)
Glucose-Capillary: 90 mg/dL (ref 70–99)

## 2023-12-31 MED ORDER — FREE WATER
200.0000 mL | Freq: Once | Status: AC
  Administered 2023-12-31: 200 mL

## 2023-12-31 MED ORDER — LACTATED RINGERS IV BOLUS
500.0000 mL | Freq: Once | INTRAVENOUS | Status: AC
  Administered 2023-12-31: 500 mL via INTRAVENOUS

## 2023-12-31 MED ORDER — DEXTROSE 5 % IV SOLN: INTRAVENOUS | Status: AC

## 2023-12-31 MED ORDER — INSULIN ASPART 100 UNIT/ML IJ SOLN
0.0000 [IU] | INTRAMUSCULAR | Status: DC
  Administered 2023-12-31 (×4): 2 [IU] via SUBCUTANEOUS

## 2023-12-31 MED ORDER — FREE WATER
400.0000 mL | Status: DC
  Administered 2023-12-31 (×2): 400 mL
  Administered 2023-12-31: 200 mL
  Administered 2023-12-31 – 2024-01-02 (×12): 400 mL

## 2023-12-31 NOTE — Plan of Care (Signed)
°  Problem: Education: °Goal: Knowledge of General Education information will improve °Description: Including pain rating scale, medication(s)/side effects and non-pharmacologic comfort measures °Outcome: Progressing °  °Problem: Clinical Measurements: °Goal: Will remain free from infection °Outcome: Progressing °  °Problem: Elimination: °Goal: Will not experience complications related to bowel motility °Outcome: Progressing °  °

## 2023-12-31 NOTE — TOC CM/SW Note (Signed)
 Transition of Care [facility]) - Inpatient Brief Assessment   Patient Details  Name: Andrew Rivera MRN: [id number] Date of Birth: Jan 09, 1951  Transition of Care [facility]) CM/SW Contact:    Tom-Johnson, Hershal Coria, RN Phone Number: 12/31/2023, 2:05 PM   Clinical Narrative:  Patient presented to the ED from [facility] with persistent Hypoxia and increased work of breathing. Admitted with Acute Respiratory Failure with Hypoxia.   Chest x-ray shows Infiltrates and Pleural Effusion. Patient on chronic Trach.   CM consulted for patient to return to [facility] at [facility]. Referral sent to DJ and he will submit insurance auth.  CM will continue to follow.       Transition of Care Asessment:

## 2023-12-31 NOTE — Progress Notes (Signed)
 PROGRESS NOTE    Andrew Rivera  ZOX:[id number] DOB: 03-16-1951 DOA: 12/27/2023 PCP: Michail Sermon, MD   Brief Narrative: 73-year-old with past medical history significant for chronic hypoxic respiratory failure on vent dependent/tracheostomy, PEG tube feeds, quadriplegic, hypertension, depression was brought to the ER after patient was found to be persistently hypoxic and with increased work of breathing.  Per family over the last couple of hours patient was getting more short of breath and decided to send her to the ED for evaluation.  Chest x-ray shows infiltrates and pleural effusion.  Presented with leukocytosis white blood cell 20, BNP 60, patient started on empiric antibiotics and IV Lasix.  Critical care was consulted and requested admission under [facility]   Assessment & Plan:   Principal Problem:   Acute respiratory failure with hypoxia (HCC) Active Problems:   Chronic hypoxic respiratory failure (HCC)   Ventilator dependent (HCC)   Tracheostomy dependence (HCC)   Depression   Pneumonia   Acute on chronic heart failure with preserved ejection fraction (HFpEF) (HCC)   UTI (urinary tract infection)   Acute encephalopathy   1-Acute on Chronic Hypoxic Respiratory Failure in the setting of Pneumonia and possible Acute on Chronic Diastolic Heart Failure Exacerbation: -Patient with history of chronic respiratory failure  trach and vent dependent -Chest x-ray showed infiltrates and pleural effusion -Tracheal aspirate : E coli, ESBL.  -Received a dose of IV lasix 3/06.  -hold lasix due to hypernatremia.  Vent setting stable.  Antibiotics change to Meropenem 3/09. Continue WBC trending down.   Acute metabolic encephalopathy: -He is alert , trach and on vent.   UTI: UA with more than 50 WBC.  Follow urine culture. Growing E coli ESBL IV meropenem started 3/09. Transition to Bactrim at discharge.   Hypernatremia;  Sodium continue to increase today.  Increase free water to 400  cc Q 4hours.  I have added D 5.  Repeat Sodium this afternoon.   Hypertension: On Norvasc.  PRN Metoprolol.   Chronic dysphagia status post chronic PEG tube continue with tube feedings Continue tube feeding.  Will change insulin coverage to Q 4 Hours.   Diarrhea;  Only had one episode yesterday   Depression Continue Lexapro, Lyrica.     GERD: PPI  Quadriplegia; support care   Estimated body mass index is 29.33 kg/m as calculated from the following:   Height as of this encounter: 5\' 9"  (1.753 m).   Weight as of this encounter: 90.1 kg.   DVT prophylaxis: Lovenox Code Status: Full code Family Communication: Wife over phone. 3/07 will call wife later Disposition Plan:  Status is: Inpatient Remains inpatient appropriate because: [place] for antibiotics.     Consultants:  CCM.   Procedures:  None  Antimicrobials:  Doxycycline and Cefepime.  Received a dose Vancomycin 3/06  Subjective: He is alert, but less responsive   Objective: Vitals:   12/31/23 1000 12/31/23 1100 12/31/23 1147 12/31/23 1153  BP: (!) 158/43 (!) 146/50  (!) 146/50  Pulse: 96 96  98  Resp: 16 16  16   Temp:   98.1 F (36.7 C)   TempSrc:   Oral   SpO2: 98% 98%    Weight:      Height:        Intake/Output Summary (Last 24 hours) at 12/31/2023 1251 Last data filed at 12/31/2023 0951 Gross per 24 hour  Intake 2862.5 ml  Output 2650 ml  Net 212.5 ml   Filed Weights   12/27/23 1918 12/28/23 0400  Weight: 95.3 kg 90.1 kg    Examination:  General exam: NAD Respiratory system: Trach, on vet Cardiovascular system: S 1, S 2 RRR Gastrointestinal system: BS present, soft, nt Central nervous system: Alert, Quadriplegic Extremities: no edema   Data Reviewed: I have personally reviewed following labs and imaging studies  CBC: Recent Labs  Lab 12/27/23 1958 12/28/23 0420 12/29/23 0354 12/30/23 0422 12/31/23 0319  WBC 20.1* 25.7* 21.4* 17.6* 12.5*  NEUTROABS 18.4* 23.1*  --   --    --   HGB 13.6 14.1 12.2* 12.5* 11.9*  HCT 42.4 43.3 38.1* 39.5 39.2  MCV 87.4 87.1 88.0 89.4 92.0  PLT 290 310 249 269 253   Basic Metabolic Panel: Recent Labs  Lab 12/27/23 1958 12/28/23 0420 12/28/23 0847 12/29/23 0354 12/30/23 0422 12/31/23 0319  NA 139 140  --  144 152* 156*  K 4.1 2.7*  --  4.7 3.8 4.2  CL 101 99  --  110 115* 117*  CO2 27 28  --  28 30 34*  GLUCOSE 144* 141*  --  147* 178* 126*  BUN 20 18  --  36* 43* 51*  CREATININE <0.30* <0.30*  --  <0.30* <0.30* <0.30*  CALCIUM 9.2 9.1  --  9.3 9.8 9.7  MG  --   --  1.9  --   --   --    GFR: CrCl cannot be calculated (This lab value cannot be used to calculate CrCl because it is not a number: <0.30). Liver Function Tests: Recent Labs  Lab 12/27/23 1958 12/28/23 0420  AST 22 27  ALT 23 26  ALKPHOS 130* 129*  BILITOT 0.4 1.1  PROT 8.3* 8.4*  ALBUMIN 3.3* 3.5   No results for input(s): "LIPASE", "AMYLASE" in the last 168 hours. No results for input(s): "AMMONIA" in the last 168 hours. Coagulation Profile: No results for input(s): "INR", "PROTIME" in the last 168 hours. Cardiac Enzymes: No results for input(s): "CKTOTAL", "CKMB", "CKMBINDEX", "TROPONINI" in the last 168 hours. BNP (last 3 results) No results for input(s): "PROBNP" in the last 8760 hours. HbA1C: Recent Labs    12/28/23 1550  HGBA1C 4.8   CBG: Recent Labs  Lab 12/30/23 0728 12/30/23 1115 12/30/23 1529 12/31/23 0833 12/31/23 1144  GLUCAP 135* 108* 130* 136* 131*   Lipid Profile: No results for input(s): "CHOL", "HDL", "LDLCALC", "TRIG", "CHOLHDL", "LDLDIRECT" in the last 72 hours. Thyroid Function Tests: No results for input(s): "TSH", "T4TOTAL", "FREET4", "T3FREE", "THYROIDAB" in the last 72 hours.  Anemia Panel: No results for input(s): "VITAMINB12", "FOLATE", "FERRITIN", "TIBC", "IRON", "RETICCTPCT" in the last 72 hours. Sepsis Labs: Recent Labs  Lab 12/27/23 2209  LATICACIDVEN 0.4*    Recent Results (from the past  240 hours)  Resp panel by RT-PCR (RSV, Flu A&B, Covid) Anterior Nasal Swab     Status: None   Collection Time: 12/27/23  8:25 PM   Specimen: Anterior Nasal Swab  Result Value Ref Range Status   SARS Coronavirus 2 by RT PCR NEGATIVE NEGATIVE Final   Influenza A by PCR NEGATIVE NEGATIVE Final   Influenza B by PCR NEGATIVE NEGATIVE Final    Comment: (NOTE) The Xpert Xpress SARS-CoV-2/FLU/RSV plus assay is intended as an aid in the diagnosis of influenza from Nasopharyngeal swab specimens and should not be used as a sole basis for treatment. Nasal washings and aspirates are unacceptable for Xpert Xpress SARS-CoV-2/FLU/RSV testing.  Fact Sheet for Patients: BloggerCourse.com  Fact Sheet for Healthcare Providers: SeriousBroker.it  This test  is not yet approved or cleared by the [place] and has been authorized for detection and/or diagnosis of SARS-CoV-2 by FDA under an Emergency Use Authorization (EUA). This EUA will remain in effect (meaning this test can be used) for the duration of the COVID-19 declaration under Section 564(b)(1) of the Act, 21 U.S.C. section 360bbb-3(b)(1), unless the authorization is terminated or revoked.     Resp Syncytial Virus by PCR NEGATIVE NEGATIVE Final    Comment: (NOTE) Fact Sheet for Patients: BloggerCourse.com  Fact Sheet for Healthcare Providers: SeriousBroker.it  This test is not yet approved or cleared by the [place] FDA and has been authorized for detection and/or diagnosis of SARS-CoV-2 by FDA under an Emergency Use Authorization (EUA). This EUA will remain in effect (meaning this test can be used) for the duration of the COVID-19 declaration under Section 564(b)(1) of the Act, 21 U.S.C. section 360bbb-3(b)(1), unless the authorization is terminated or revoked.  Performed at [facility], [address],  [place], [place] [address]   Blood culture (routine x 2)     Status: None (Preliminary result)   Collection Time: 12/27/23 10:00 PM   Specimen: BLOOD RIGHT ARM  Result Value Ref Range Status   Specimen Description BLOOD RIGHT ARM  Final   Special Requests   Final    BOTTLES DRAWN AEROBIC AND ANAEROBIC Blood Culture adequate volume   Culture   Final    NO GROWTH 4 DAYS Performed at [facility], [address] [place], [place] [address]    Report Status PENDING  Incomplete  Blood culture (routine x 2)     Status: None (Preliminary result)   Collection Time: 12/27/23 10:25 PM   Specimen: BLOOD LEFT ARM  Result Value Ref Range Status   Specimen Description BLOOD LEFT ARM  Final   Special Requests   Final    BOTTLES DRAWN AEROBIC AND ANAEROBIC Blood Culture adequate volume   Culture   Final    NO GROWTH 4 DAYS Performed at [facility], [address] [place], [place] [address]    Report Status PENDING  Incomplete  Urine Culture     Status: Abnormal   Collection Time: 12/27/23 11:10 PM   Specimen: Urine, Random  Result Value Ref Range Status   Specimen Description URINE, RANDOM  Final   Special Requests NONE Reflexed from [id number]  Final   Culture (A)  Final    >=100,000 COLONIES/mL ESCHERICHIA COLI Confirmed Extended Spectrum Beta-Lactamase Producer (ESBL).  In bloodstream infections from ESBL organisms, carbapenems are preferred over piperacillin/tazobactam. They are shown to have a lower risk of mortality.    Report Status 12/30/2023 FINAL  Final   Organism ID, Bacteria ESCHERICHIA COLI (A)  Final      Susceptibility   Escherichia coli - MIC*    AMPICILLIN >=32 RESISTANT Resistant     CEFAZOLIN >=64 RESISTANT Resistant     CEFEPIME >=32 RESISTANT Resistant     CEFTRIAXONE >=64 RESISTANT Resistant     CIPROFLOXACIN >=4 RESISTANT Resistant     GENTAMICIN <=1 SENSITIVE Sensitive     IMIPENEM <=0.25 SENSITIVE Sensitive     NITROFURANTOIN <=16 SENSITIVE Sensitive      TRIMETH/SULFA <=20 SENSITIVE Sensitive     AMPICILLIN/SULBACTAM >=32 RESISTANT Resistant     PIP/TAZO <=4 SENSITIVE Sensitive ug/mL    * >=100,000 COLONIES/mL ESCHERICHIA COLI  MRSA Next Gen by PCR, Nasal     Status: None   Collection Time: 12/28/23  3:40 AM   Specimen: Nasal Mucosa; Nasal Swab  Result Value Ref Range Status   MRSA by PCR Next Gen NOT DETECTED NOT DETECTED Final    Comment: (NOTE) The GeneXpert MRSA Assay (FDA approved for NASAL specimens only), is one component of a comprehensive MRSA colonization surveillance program. It is not intended to diagnose MRSA infection nor to guide or monitor treatment for MRSA infections. Test performance is not FDA approved in patients less than 7 years old. Performed at [facility], [address], [place], [place] [address]   Culture, Respiratory w Gram Stain     Status: None (Preliminary result)   Collection Time: 12/28/23  3:51 AM   Specimen: Tracheal Aspirate  Result Value Ref Range Status   Specimen Description TRACHEAL ASPIRATE  Final   Special Requests NONE  Final   Gram Stain   Final    MODERATE WBC PRESENT,BOTH PMN AND MONONUCLEAR FEW GRAM NEGATIVE RODS    Culture   Final    MODERATE ESCHERICHIA COLI Confirmed Extended Spectrum Beta-Lactamase Producer (ESBL).  In bloodstream infections from ESBL organisms, carbapenems are preferred over piperacillin/tazobactam. They are shown to have a lower risk of mortality. CULTURE REINCUBATED FOR BETTER GROWTH Performed at [facility], [address], [place], [place] [address]    Report Status PENDING  Incomplete   Organism ID, Bacteria ESCHERICHIA COLI  Final      Susceptibility   Escherichia coli - MIC*    AMPICILLIN >=32 RESISTANT Resistant     CEFEPIME 16 RESISTANT Resistant     CEFTAZIDIME RESISTANT Resistant     CEFTRIAXONE >=64 RESISTANT Resistant     CIPROFLOXACIN >=4 RESISTANT Resistant     GENTAMICIN <=1 SENSITIVE Sensitive     IMIPENEM <=0.25  SENSITIVE Sensitive     TRIMETH/SULFA <=20 SENSITIVE Sensitive     AMPICILLIN/SULBACTAM 16 INTERMEDIATE Intermediate     PIP/TAZO <=4 SENSITIVE Sensitive ug/mL    * MODERATE ESCHERICHIA COLI         Radiology Studies: No results found.       Scheduled Meds:  amLODipine  10 mg Per Tube Daily   enoxaparin (LOVENOX) injection  40 mg Subcutaneous Q24H   escitalopram  5 mg Per Tube Daily   feeding supplement (PROSource TF20)  60 mL Per Tube Daily   fentaNYL  1 patch Transdermal Q72H   free water  400 mL Per Tube Q4H   guaiFENesin  10 mL Oral TID   insulin aspart  0-15 Units Subcutaneous Q4H   loratadine  10 mg Oral Daily   melatonin  10 mg Per Tube QHS   nutrition supplement (JUVEN)  1 packet Per Tube BID BM   mouth rinse  15 mL Mouth Rinse Q2H   pantoprazole (PROTONIX) IV  40 mg Intravenous QHS   pregabalin  100 mg Per Tube TID   Continuous Infusions:  dextrose 100 mL/hr at 12/31/23 0836   feeding supplement (OSMOLITE 1.5 CAL) 50 mL/hr at 12/31/23 0700   meropenem (MERREM) IV Stopped (12/31/23 0453)     LOS: 3 days    Time spent: 35 minutes    Odai Wimmer A Leonardo Makris, MD [facility]   If 7PM-7AM, please contact night-coverage [email or web address]  12/31/2023, 12:51 PM

## 2023-12-31 NOTE — Consult Note (Addendum)
   NAME:  Andrew Rivera, MRN:  [id number], DOB:  10-01-1951, LOS: 3 ADMISSION DATE:  12/27/2023, CONSULTATION DATE:  12/28/23 REFERRING MD:  Gerhard Munch, MD, CHIEF COMPLAINT:  AMS and hypoxia from Vent facility  History of Present Illness:  73 y/o male chronic trach?peg on vent who was sent from facility secondary to AMS low sats.  Although EMS reports sats 96-99% and per ED note mentation baseline (he was mouthing some words in ED).  In the ED his cxr suggested pneumonia and small b/l pleural effusions.  When he was being turned he desated and was put on 100% FIO2.  In the ED he received 80 IVP Lasix and Zosyn and vancomycin.  Blood and sputum cx's pending.  Pertinent  Medical History  Chronic  Significant Hospital Events: Including procedures, antibiotic start and stop dates in addition to other pertinent events   3/7: transferred from Ventilator Facility for AMS and Hypoxia, chronic trach and Peg  Interim History / Subjective:  Appears comfortable  Objective   Blood pressure (!) 146/50, pulse 92, temperature 98.2 F (36.8 C), temperature source Axillary, resp. rate 16, height 5\' 9"  (1.753 m), weight 90.1 kg, SpO2 100%.    Vent Mode: PRVC FiO2 (%):  [40 %] 40 % Set Rate:  [16 bmp] 16 bmp Vt Set:  [550 mL] 550 mL PEEP:  [5 cmH20] 5 cmH20 Plateau Pressure:  [20 cmH20-23 cmH20] 22 cmH20   Intake/Output Summary (Last 24 hours) at 12/31/2023 0755 Last data filed at 12/31/2023 0700 Gross per 24 hour  Intake 3647.54 ml  Output 2280 ml  Net 1367.54 ml   Filed Weights   12/27/23 1918 12/28/23 0400  Weight: 95.3 kg 90.1 kg    Examination: General Chronically ill-appearing 73 year old male lying in bed remains on full ventilator support HEENT normocephalic atraumatic size 6 XLT tracheostomy is midline stoma unremarkable Pulmonary: Coarse scattered rhonchi no accessory use Portable chest x-ray from the sixth showed bilateral patchy airspace disease with decreased right basilar aeration  likely consistent with effusion Cardiac regular rate and rhythm Abdomen soft not tender Neuro awake, tracks, unable to follow commands, generalized and profound weakness GU clear yellow. Extremities: Warm dry with dependent edema  Resolved Hospital Problem list   N/A  Assessment & Plan:  Acute on chronic respiratory failure Vent dependence  Trach dependence  Pneumonia-MDR ECOLI VAP  Pleural effusions Baseline quadriplegia HFpEF UTI ECOLI ESBL producer  H/o Depression AMS-seems to be at baseline per chart recordings Severe hypernatremia Hyperchloremia Hyperglycemia Azotemia  Pulm prob list  Acute on chronic respiratory failure 2/2 MDR Ecoli VAP, complicated by volume overload with pulmonary edema and pleural effusions in a patient with chronic trach and ventilator dependence Cbc improving. Afebrile.  FiO2 requirements improved Plan Continuing full ventilator support Routine tracheostomy care VAP bundle Changed antibiotics to meropenem on 3/9, susceptibilities show resistance to cefepime in regards to the sputum, and also he has ESBL producing urinary tract infection I stopped the Doxy Agree with holding Lasix for now given severe hypernatremia  Best Practice (right click and "Reselect all SmartList Selections" daily)   Per primary team      My time 28 min not CCT

## 2024-01-01 DIAGNOSIS — J9601 Acute respiratory failure with hypoxia: Secondary | ICD-10-CM | POA: Diagnosis not present

## 2024-01-01 LAB — BASIC METABOLIC PANEL
Anion gap: 7 (ref 5–15)
BUN: 40 mg/dL — ABNORMAL HIGH (ref 8–23)
CO2: 31 mmol/L (ref 22–32)
Calcium: 8.8 mg/dL — ABNORMAL LOW (ref 8.9–10.3)
Chloride: 109 mmol/L (ref 98–111)
Creatinine, Ser: <0.3 mg/dL — ABNORMAL LOW (ref 0.61–1.24)
Glucose, Bld: 128 mg/dL — ABNORMAL HIGH (ref 70–99)
Potassium: 3.9 mmol/L (ref 3.5–5.1)
Sodium: 147 mmol/L — ABNORMAL HIGH (ref 135–145)

## 2024-01-01 LAB — CBC
HCT: 38.7 % — ABNORMAL LOW (ref 39.0–52.0)
Hemoglobin: 11.9 g/dL — ABNORMAL LOW (ref 13.0–17.0)
MCH: 28.1 pg (ref 26.0–34.0)
MCHC: 30.7 g/dL (ref 30.0–36.0)
MCV: 91.3 fL (ref 80.0–100.0)
Platelets: 216 10*3/uL (ref 150–400)
RBC: 4.24 MIL/uL (ref 4.22–5.81)
RDW: 18.1 % — ABNORMAL HIGH (ref 11.5–15.5)
WBC: 9.6 10*3/uL (ref 4.0–10.5)
nRBC: 0 % (ref 0.0–0.2)

## 2024-01-01 LAB — CARBAPENEM RESISTANCE PANEL
Carba Resistance IMP Gene: NOT DETECTED
Carba Resistance KPC Gene: NOT DETECTED
Carba Resistance NDM Gene: DETECTED — AB
Carba Resistance OXA48 Gene: NOT DETECTED
Carba Resistance VIM Gene: NOT DETECTED

## 2024-01-01 LAB — CULTURE, BLOOD (ROUTINE X 2)
Culture: NO GROWTH
Culture: NO GROWTH
Special Requests: ADEQUATE
Special Requests: ADEQUATE

## 2024-01-01 LAB — GLUCOSE, CAPILLARY
Glucose-Capillary: 104 mg/dL — ABNORMAL HIGH (ref 70–99)
Glucose-Capillary: 116 mg/dL — ABNORMAL HIGH (ref 70–99)
Glucose-Capillary: 117 mg/dL — ABNORMAL HIGH (ref 70–99)
Glucose-Capillary: 119 mg/dL — ABNORMAL HIGH (ref 70–99)
Glucose-Capillary: 119 mg/dL — ABNORMAL HIGH (ref 70–99)
Glucose-Capillary: 124 mg/dL — ABNORMAL HIGH (ref 70–99)

## 2024-01-01 MED ORDER — SULFAMETHOXAZOLE-TRIMETHOPRIM 800-160 MG PO TABS
1.0000 | ORAL_TABLET | Freq: Two times a day (BID) | ORAL | Status: AC
Start: 2024-01-01 — End: 2024-01-08
  Administered 2024-01-01 – 2024-01-08 (×14): 1
  Filled 2024-01-01 (×14): qty 1

## 2024-01-01 MED ORDER — SULFAMETHOXAZOLE-TRIMETHOPRIM 800-160 MG PO TABS
1.0000 | ORAL_TABLET | Freq: Two times a day (BID) | ORAL | Status: DC
  Filled 2024-01-01: qty 1

## 2024-01-01 MED ORDER — DEXTROSE 5 % IV SOLN: INTRAVENOUS | Status: DC

## 2024-01-01 NOTE — Plan of Care (Signed)

## 2024-01-01 NOTE — Progress Notes (Addendum)
 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  Per family over the last couple of hours patient was getting more short of breath and decided to send her to the ED for evaluation.  Chest x-ray shows infiltrates and pleural effusion.  Presented with leukocytosis white blood cell 20, BNP 60, patient started on empiric antibiotics and IV Lasix.  Critical care was consulted and requested admission under [facility].  Patient was found to have E coli ESBL in trach secretion and Urine culture. Antibiotics change to Cefepime on 12/30/2023. He develops Hypernatremia, free water added to tube feeding and started on D 5 IV fluids. Sodium level decreasing.  Awaiting evaluation for LTAC>    Assessment & Plan:   Principal Problem:   Acute respiratory failure with hypoxia (HCC) Active Problems:   Chronic hypoxic respiratory failure (HCC)   Ventilator dependent (HCC)   Tracheostomy dependence (HCC)   Depression   Pneumonia   Acute on chronic heart failure with preserved ejection fraction (HFpEF) (HCC)   UTI (urinary tract infection)   Acute encephalopathy   1-Acute on Chronic Hypoxic Respiratory Failure in the setting of Pneumonia and possible Acute on Chronic Diastolic Heart Failure Exacerbation: -Patient with history of chronic respiratory failure  trach and vent dependent -Chest x-ray showed infiltrates and pleural effusion -Tracheal aspirate : E coli, ESBL.  -Received a dose of IV lasix 3/06.  -hold lasix due to hypernatremia.  Vent setting stable.  Antibiotics change to Meropenem 3/09. Continue WBC trending down.  Addendum:  Trach aspirate  now growing : carbapenem resistant providencia species. ID have been consulted.   Acute metabolic encephalopathy: - trach and on vent.  -He is alert, he communicate with Mouth wording at baseline. He has only been able to say yes and no.  Appears more alert today.   UTI: UA with more than 50 WBC.  Follow urine culture. Growing E coli ESBL IV meropenem started 3/09. Could Transition to Bactrim at discharge.   Hypernatremia;  Sodium continue to increase today.  Increase free water to 400 cc Q 4hours.  Continue with D 5 IV fluids.  Sodium trending down from 156--147  Hypertension: On Norvasc.  PRN Metoprolol.   Chronic dysphagia status post chronic PEG tube continue with tube feedings Continue tube feeding.  Continue  insulin coverage to Q 4 Hours.   Diarrhea;  Only had one episode   Depression Continue Lexapro, Lyrica.     GERD: PPI  Quadriplegia; support care   Estimated body mass index is 29.33 kg/m as calculated from the following:   Height as of this encounter: 5\' 9"  (1.753 m).   Weight as of this encounter: 90.1 kg.   DVT prophylaxis: Lovenox Code Status: Full code Family Communication: Wife over phone 3/10 Disposition Plan:  Status is: Inpatient Remains inpatient appropriate because: [place] for antibiotics.     Consultants:  CCM.   Procedures:  None  Antimicrobials:  Doxycycline and Cefepime.  Received a dose Vancomycin 3/06  Subjective: He is alert, mouth wording yes and no , denies pain.   Objective: Vitals:   01/01/24 1100 01/01/24 1119 01/01/24 1141 01/01/24 1200  BP: (!) 124/49  (!) 124/49 (!) 117/47  Pulse: 71  66 70  Resp: 18  16 16   Temp:  (!) 96.2 F (35.7 C)    TempSrc:  Axillary    SpO2: 100%  100% 100%  Weight:      Height:        Intake/Output Summary (Last 24 hours) at 01/01/2024 1434 Last data filed at 01/01/2024 1200 Gross per 24 hour  Intake 4048.26 ml  Output 1350 ml  Net 2698.26 ml   Filed Weights   12/27/23 1918  12/28/23 0400  Weight: 95.3 kg 90.1 kg    Examination:  General exam: NAD Respiratory system: Trach on vent Cardiovascular system: S 1, S 2 RRR Gastrointestinal system: BS present, soft, nt Central nervous system: Alert, Quadriplegic Extremities: no edema   Data Reviewed: I have personally reviewed following labs and imaging studies  CBC: Recent Labs  Lab 12/27/23 1958 12/28/23 0420 12/29/23 0354 12/30/23 0422 12/31/23 0319 01/01/24 0407  WBC 20.1* 25.7* 21.4* 17.6* 12.5* 9.6  NEUTROABS 18.4* 23.1*  --   --   --   --   HGB 13.6 14.1 12.2* 12.5* 11.9* 11.9*  HCT 42.4 43.3 38.1* 39.5 39.2 38.7*  MCV 87.4 87.1 88.0 89.4 92.0 91.3  PLT 290 310 249 269 253 216   Basic Metabolic Panel: Recent Labs  Lab 12/28/23 0847 12/29/23 0354 12/30/23 0422 12/31/23 0319 12/31/23 1413 01/01/24 0407  NA  --  144 152* 156* 155* 147*  K  --  4.7 3.8 4.2 4.2 3.9  CL  --  110 115* 117* 116* 109  CO2  --  28 30 34* 31 31  GLUCOSE  --  147* 178* 126* 174* 128*  BUN  --  36* 43* 51* 42* 40*  CREATININE  --  <0.30* <0.30* <0.30* <0.30* <0.30*  CALCIUM  --  9.3 9.8 9.7 9.5 8.8*  MG 1.9  --   --   --   --   --    GFR: CrCl cannot be calculated (This lab value cannot be used to calculate CrCl because it is not a number: <0.30). Liver Function Tests: Recent Labs  Lab 12/27/23 1958 12/28/23 0420  AST 22 27  ALT 23 26  ALKPHOS 130* 129*  BILITOT 0.4 1.1  PROT 8.3* 8.4*  ALBUMIN 3.3* 3.5   No results for input(s): "LIPASE", "AMYLASE" in the last 168 hours. No results for input(s): "AMMONIA" in the last 168 hours. Coagulation Profile: No results for input(s): "INR", "PROTIME" in the last 168 hours. Cardiac Enzymes: No results for input(s): "CKTOTAL", "CKMB", "CKMBINDEX", "TROPONINI" in the last 168 hours. BNP (last 3 results) No results for input(s): "PROBNP" in the last 8760 hours. HbA1C: No results for input(s): "HGBA1C" in the last 72 hours.  CBG: Recent Labs  Lab  12/31/23 1926 12/31/23 2332 01/01/24 0400 01/01/24 0801 01/01/24 1116  GLUCAP 90 138* 119* 104* 116*   Lipid Profile: No results for input(s): "CHOL", "HDL", "LDLCALC", "TRIG", "CHOLHDL", "LDLDIRECT" in the last 72 hours. Thyroid Function Tests: No results for input(s): "TSH", "T4TOTAL", "FREET4", "T3FREE", "THYROIDAB" in the last 72 hours.  Anemia Panel: No results for input(s): "VITAMINB12", "FOLATE", "FERRITIN", "TIBC", "IRON", "RETICCTPCT" in the last 72 hours. Sepsis Labs: Recent Labs  Lab 12/27/23 2209  LATICACIDVEN 0.4*    Recent Results (from the past 240 hours)  Resp panel by RT-PCR (RSV, Flu A&B, Covid) Anterior Nasal Swab     Status: None   Collection Time: 12/27/23  8:25 PM   Specimen: Anterior Nasal Swab  Result Value Ref Range Status   SARS Coronavirus 2 by RT PCR NEGATIVE NEGATIVE Final   Influenza A by PCR NEGATIVE NEGATIVE Final   Influenza B by PCR NEGATIVE NEGATIVE Final    Comment: (NOTE) The Xpert Xpress SARS-CoV-2/FLU/RSV plus assay is intended as an aid in the diagnosis of influenza from Nasopharyngeal swab specimens and should not be used as a sole basis for treatment. Nasal washings and aspirates are unacceptable for Xpert Xpress SARS-CoV-2/FLU/RSV testing.  Fact Sheet for Patients: BloggerCourse.com  Fact Sheet for Healthcare Providers: SeriousBroker.it  This test is not yet approved or cleared by the [place] FDA and has been authorized for detection and/or diagnosis of SARS-CoV-2 by FDA under an Emergency Use Authorization (EUA). This EUA will remain in effect (meaning this test can be used) for the duration of the COVID-19 declaration under Section 564(b)(1) of the Act, 21 U.S.C. section 360bbb-3(b)(1), unless the authorization is terminated or revoked.     Resp Syncytial Virus by PCR NEGATIVE NEGATIVE Final    Comment: (NOTE) Fact Sheet for  Patients: BloggerCourse.com  Fact Sheet for Healthcare Providers: SeriousBroker.it  This test is not yet approved or cleared by the [place] FDA and has been authorized for detection and/or diagnosis of SARS-CoV-2 by FDA under an Emergency Use Authorization (EUA). This EUA will remain in effect (meaning this test can be used) for the duration of the COVID-19 declaration under Section 564(b)(1) of the Act, 21 U.S.C. section 360bbb-3(b)(1), unless the authorization is terminated or revoked.  Performed at [facility], [address], [place], [place] [address]   Blood culture (routine x 2)     Status: None   Collection Time: 12/27/23 10:00 PM   Specimen: BLOOD RIGHT ARM  Result Value Ref Range Status   Specimen Description BLOOD RIGHT ARM  Final   Special Requests   Final    BOTTLES DRAWN AEROBIC AND ANAEROBIC Blood Culture adequate volume   Culture   Final    NO GROWTH 5 DAYS Performed at [facility], [address] [place], [place] [address]    Report Status 01/01/2024 FINAL  Final  Blood culture (routine x 2)     Status: None   Collection Time: 12/27/23 10:25 PM   Specimen: BLOOD LEFT ARM  Result Value Ref Range Status   Specimen Description BLOOD LEFT ARM  Final   Special Requests   Final    BOTTLES DRAWN AEROBIC AND ANAEROBIC Blood Culture adequate volume   Culture   Final    NO GROWTH 5 DAYS Performed at [facility], [address], [place], [place] [address]    Report Status 01/01/2024 FINAL  Final  Urine Culture     Status: Abnormal   Collection Time: 12/27/23 11:10 PM   Specimen: Urine, Random  Result Value Ref Range Status   Specimen Description URINE, RANDOM  Final   Special Requests NONE Reflexed from [id number]  Final   Culture (A)  Final    >=100,000 COLONIES/mL ESCHERICHIA COLI Confirmed Extended Spectrum Beta-Lactamase Producer (ESBL).  In bloodstream infections from ESBL organisms,  carbapenems are preferred over piperacillin/tazobactam. They are shown to have a lower risk of mortality.    Report Status 12/30/2023 FINAL  Final   Organism ID, Bacteria ESCHERICHIA COLI (A)  Final      Susceptibility   Escherichia coli - MIC*    AMPICILLIN >=32 RESISTANT Resistant     CEFAZOLIN >=64 RESISTANT Resistant  CEFEPIME >=32 RESISTANT Resistant     CEFTRIAXONE >=64 RESISTANT Resistant     CIPROFLOXACIN >=4 RESISTANT Resistant     GENTAMICIN <=1 SENSITIVE Sensitive     IMIPENEM <=0.25 SENSITIVE Sensitive     NITROFURANTOIN <=16 SENSITIVE Sensitive     TRIMETH/SULFA <=20 SENSITIVE Sensitive     AMPICILLIN/SULBACTAM >=32 RESISTANT Resistant     PIP/TAZO <=4 SENSITIVE Sensitive ug/mL    * >=100,000 COLONIES/mL ESCHERICHIA COLI  MRSA Next Gen by PCR, Nasal     Status: None   Collection Time: 12/28/23  3:40 AM   Specimen: Nasal Mucosa; Nasal Swab  Result Value Ref Range Status   MRSA by PCR Next Gen NOT DETECTED NOT DETECTED Final    Comment: (NOTE) The GeneXpert MRSA Assay (FDA approved for NASAL specimens only), is one component of a comprehensive MRSA colonization surveillance program. It is not intended to diagnose MRSA infection nor to guide or monitor treatment for MRSA infections. Test performance is not FDA approved in patients less than 25 years old. Performed at [facility], [address], [place], [place] [address]   Culture, Respiratory w Gram Stain     Status: None (Preliminary result)   Collection Time: 12/28/23  3:51 AM   Specimen: Tracheal Aspirate  Result Value Ref Range Status   Specimen Description TRACHEAL ASPIRATE  Final   Special Requests NONE  Final   Gram Stain   Final    MODERATE WBC PRESENT,BOTH PMN AND MONONUCLEAR FEW GRAM NEGATIVE RODS    Culture   Final    MODERATE ESCHERICHIA COLI Confirmed Extended Spectrum Beta-Lactamase Producer (ESBL).  In bloodstream infections from ESBL organisms, carbapenems are preferred over  piperacillin/tazobactam. They are shown to have a lower risk of mortality. MODERATE PROVIDENCIA RETTGERI CONFIRMED CARBAPENEMASE RESISTANT ENTEROBACTERIACAE CRITICAL RESULT CALLED TO, READ BACK BY AND VERIFIED WITH: E.EDGES AT 1400 ON 01/01/2024 BY T.SAAD. MODERATE PSEUDOMONAS AERUGINOSA SUSCEPTIBILITIES TO FOLLOW Performed at [facility], [address], [place], [place] [address]    Report Status PENDING  Incomplete   Organism ID, Bacteria ESCHERICHIA COLI  Final   Organism ID, Bacteria PROVIDENCIA RETTGERI  Final      Susceptibility   Escherichia coli - MIC*    AMPICILLIN >=32 RESISTANT Resistant     CEFEPIME 16 RESISTANT Resistant     CEFTAZIDIME RESISTANT Resistant     CEFTRIAXONE >=64 RESISTANT Resistant     CIPROFLOXACIN >=4 RESISTANT Resistant     GENTAMICIN <=1 SENSITIVE Sensitive     IMIPENEM <=0.25 SENSITIVE Sensitive     TRIMETH/SULFA <=20 SENSITIVE Sensitive     AMPICILLIN/SULBACTAM 16 INTERMEDIATE Intermediate     PIP/TAZO <=4 SENSITIVE Sensitive ug/mL    * MODERATE ESCHERICHIA COLI   Providencia rettgeri - MIC*    AMPICILLIN >=32 RESISTANT Resistant     CEFEPIME 16 RESISTANT Resistant     CEFTAZIDIME 32 RESISTANT Resistant     CEFTRIAXONE 2 INTERMEDIATE Intermediate     CIPROFLOXACIN 0.5 INTERMEDIATE Intermediate     GENTAMICIN 8 INTERMEDIATE Intermediate     IMIPENEM >=16 RESISTANT Resistant     TRIMETH/SULFA <=20 SENSITIVE Sensitive     AMPICILLIN/SULBACTAM >=32 RESISTANT Resistant     PIP/TAZO 8 SENSITIVE Sensitive ug/mL    * MODERATE PROVIDENCIA RETTGERI  Carbapenem Resistance Panel     Status: Abnormal   Collection Time: 12/28/23  3:51 AM  Result Value Ref Range Status   Carba Resistance IMP Gene NOT DETECTED NOT DETECTED Final   Carba  Resistance VIM Gene NOT DETECTED NOT DETECTED Final   Carba Resistance NDM Gene DETECTED (A) NOT DETECTED Final    Comment: CRITICAL RESULT CALLED TO, READ BACK BY AND VERIFIED WITH: E.EDGES AT 1400 ON 01/01/2024 BY  T.SAAD.    Carba Resistance KPC Gene NOT DETECTED NOT DETECTED Final   Carba Resistance OXA48 Gene NOT DETECTED NOT DETECTED Final    Comment: (NOTE) Cepheid Carba-R is an FDA-cleared nucleic acid amplification test  (NAAT)for the detection and differentiation of genes encoding the  most prevalent carbapenemases in bacterial isolate samples. Carbapenemase gene identification and implementation of comprehensive  infection control measures are recommended by the CDC to prevent the  spread of the resistant organisms. Performed at [facility], [address] [place], [place] [address]          Radiology Studies: No results found.       Scheduled Meds:  amLODipine  10 mg Per Tube Daily   enoxaparin (LOVENOX) injection  40 mg Subcutaneous Q24H   escitalopram  5 mg Per Tube Daily   feeding supplement (PROSource TF20)  60 mL Per Tube Daily   fentaNYL  1 patch Transdermal Q72H   free water  400 mL Per Tube Q4H   guaiFENesin  10 mL Oral TID   insulin aspart  0-15 Units Subcutaneous Q4H   loratadine  10 mg Oral Daily   melatonin  10 mg Per Tube QHS   nutrition supplement (JUVEN)  1 packet Per Tube BID BM   mouth rinse  15 mL Mouth Rinse Q2H   pantoprazole (PROTONIX) IV  40 mg Intravenous QHS   pregabalin  100 mg Per Tube TID   Continuous Infusions:  feeding supplement (OSMOLITE 1.5 CAL) 50 mL/hr at 01/01/24 1200   meropenem (MERREM) IV 1 g (01/01/24 1410)     LOS: 4 days    Time spent: 35 minutes    Quynh Basso A Rockne Dearinger, MD [facility]   If 7PM-7AM, please contact night-coverage [email or web address]  01/01/2024, 2:34 PM

## 2024-01-01 NOTE — Evaluation (Signed)
 Occupational Therapy Evaluation Patient Details Name: Andrew Rivera MRN: [id number] DOB: May 12, 1951 Today's Date: 01/01/2024   History of Present Illness   73 y/o male admitted 3/7 due to AMS and low O2 saturation. Pt with  chronic trach and peg on vent  In the ED his cxr suggested pneumonia and small b/l pleural effusions. Acute on chronic resp failure. PMH: depression, GERD     Clinical Impressions Pt dependent at baseline with ADLs/functional mobility, from [facility]. Pt currently presenting close to/at baseline, visually tracks therapist back and forth in room, head turn to R (toward vent), pt with no AROM to BUE/BLE, however reports he can feel his L arm being touched. Pt at times with utterances to questions however inconsistent. Pt presenting with impairments listed below, however has no acute OT needs, will s/o. Recommend return to [facility] upon d/c without follow up therapy.     If plan is discharge home, recommend the following:   Two people to help with bathing/dressing/bathroom;Two people to help with walking and/or transfers;Assistance with feeding;Assistance with cooking/housework;Direct supervision/assist for medications management;Direct supervision/assist for financial management;Assist for transportation;Help with stairs or ramp for entrance     Functional Status Assessment   Patient has had a recent decline in their functional status and demonstrates the ability to make significant improvements in function in a reasonable and predictable amount of time.     Equipment Recommendations   None recommended by OT     Recommendations for Other Services         Precautions/Restrictions   Precautions Precautions: None Restrictions Weight Bearing Restrictions Per Provider Order: No     Mobility Bed Mobility                    Transfers                   General transfer comment: N/A as pt has been hoyer lift at [facility] for years.       Balance                                           ADL either performed or assessed with clinical judgement   ADL Overall ADL's : Needs assistance/impaired Eating/Feeding: NPO Eating/Feeding Details (indicate cue type and reason): peg Grooming: Total assistance   Upper Body Bathing: Total assistance   Lower Body Bathing: Total assistance   Upper Body Dressing : Total assistance   Lower Body Dressing: Total assistance   Toilet Transfer: Total assistance   Toileting- Clothing Manipulation and Hygiene: Total assistance       Functional mobility during ADLs: Total assistance       Vision   Additional Comments: tracks therapist in room to R/L, head turn to R toward vent     Perception Perception: Not tested       Praxis Praxis: Not tested       Pertinent Vitals/Pain Pain Assessment Pain Assessment: No/denies pain     Extremity/Trunk Assessment Upper Extremity Assessment Upper Extremity Assessment: Generalized weakness (no AROM noted in BUE)   Lower Extremity Assessment Lower Extremity Assessment: Defer to PT evaluation RLE Deficits / Details: no active movement due to quadriplegia, heel cord contractures LLE Deficits / Details: no active movement due to quadriplegia, heel cord contractures       Communication Communication Communication: Impaired Factors Affecting Communication: Trach/intubated   Cognition Arousal: Lethargic  Behavior During Therapy: Flat affect                                 Following commands: Impaired       Cueing  General Comments          Exercises     Shoulder Instructions      Home Living Family/patient expects to be discharged to:: Skilled nursing facility                                 Additional Comments: [facility] vent SNF      Prior Functioning/Environment Prior Level of Function : Needs assist             Mobility Comments: hoyer lift to chair for years  now per PT at [facility]. Does not tolerate OOB to chair well due to back pain per therapist.  Pt total care for a few years now.      OT Problem List: Decreased strength;Decreased range of motion;Decreased activity tolerance;Impaired balance (sitting and/or standing);Decreased cognition;Decreased coordination;Cardiopulmonary status limiting activity   OT Treatment/Interventions: Self-care/ADL training;Therapeutic exercise;Energy conservation;DME and/or AE instruction;Therapeutic activities;Balance training;Patient/family education      OT Goals(Current goals can be found in the care plan section)   Acute Rehab OT Goals Patient Stated Goal: unable to state OT Goal Formulation: With patient Time For Goal Achievement: 01/15/24 Potential to Achieve Goals: Fair   OT Frequency:  Min 1X/week    Co-evaluation PT/OT/SLP Co-Evaluation/Treatment: Yes Reason for Co-Treatment: Complexity of the patient's impairments (multi-system involvement);Necessary to address cognition/behavior during functional activity;To address functional/ADL transfers   OT goals addressed during session: Strengthening/ROM      AM-PAC OT "6 Clicks" Daily Activity     Outcome Measure Help from another person eating meals?: Total Help from another person taking care of personal grooming?: Total Help from another person toileting, which includes using toliet, bedpan, or urinal?: Total Help from another person bathing (including washing, rinsing, drying)?: Total Help from another person to put on and taking off regular upper body clothing?: Total Help from another person to put on and taking off regular lower body clothing?: Total 6 Click Score: 6   End of Session Equipment Utilized During Treatment: Oxygen Nurse Communication: Mobility status  Activity Tolerance: Patient tolerated treatment well Patient left: in bed;with call bell/phone within reach  OT Visit Diagnosis: Unsteadiness on feet (R26.81);Other  abnormalities of gait and mobility (R26.89);Muscle weakness (generalized) (M62.81)                Time: 1478-2956 OT Time Calculation (min): 14 min Charges:  OT General Charges $OT Visit: 1 Visit OT Evaluation $OT Eval Low Complexity: 1 Low  Zared Knoth K, OTD, OTR/L SecureChat Preferred Acute Rehab [phone]   Quynh Basso K Koonce 01/01/2024, 12:12 PM

## 2024-01-01 NOTE — Evaluation (Addendum)
 Physical Therapy Evaluation and D/C Patient Details Name: Andrew Rivera MRN: [id number] DOB: Jun 28, 1951 Today's Date: 01/01/2024  History of Present Illness  73 y/o male admitted 3/7 due to AMS and low O2 saturation. Pt with  chronic trach and peg on vent  In the ED his cxr suggested pneumonia and small b/l pleural effusions. Acute on chronic resp failure. PMH: depression, GERD  Clinical Impression  Pt admitted with above diagnosis. Pt unable to participate in any movement today.  Called [facility] and spoke with PT who stated that nursing staff use hoyer lift and get pt OOB a few x week and that pt doesn't tolerate sitting long periods due to back pain.  PT doesn't work with him and he is a long term care pt.  Pt is not appropriate to be followed by PT.  Will sign off.        If plan is discharge home, recommend the following:     Can travel by private vehicle   No    Equipment Recommendations None recommended by PT  Recommendations for Other Services       Functional Status Assessment Patient has not had a recent decline in their functional status     Precautions / Restrictions Precautions Precautions: None Restrictions Weight Bearing Restrictions Per Provider Order: No      Mobility  Bed Mobility Overal bed mobility: Needs Assistance Bed Mobility: Rolling           General bed mobility comments: Total assist    Transfers                   General transfer comment: N/A as pt has been hoyer lift at [facility] for years.    Ambulation/Gait                  Stairs            Wheelchair Mobility     Tilt Bed    Modified Rankin (Stroke Patients Only)       Balance                                             Pertinent Vitals/Pain Pain Assessment Pain Assessment: No/denies pain    Home Living Family/patient expects to be discharged to:: Skilled nursing facility                   Additional Comments: [facility]  vent SNF    Prior Function Prior Level of Function : Needs assist             Mobility Comments: hoyer lift to chair for years now per PT at [facility]. Does not tolerate OOB to chair well due to back pain per therapist.  Pt total care for a few years now.       Extremity/Trunk Assessment   Upper Extremity Assessment Upper Extremity Assessment: Defer to OT evaluation    Lower Extremity Assessment Lower Extremity Assessment: RLE deficits/detail;LLE deficits/detail RLE Deficits / Details: no active movement due to quadriplegia, heel cord contractures LLE Deficits / Details: no active movement due to quadriplegia, heel cord contractures       Communication   Communication Communication: Impaired    Cognition Arousal: Lethargic Behavior During Therapy: Flat affect   PT - Cognitive impairments: No family/caregiver present to determine baseline, Difficult to assess Difficult to assess due to:  Intubated                               Cueing       General Comments      Exercises General Exercises - Lower Extremity Ankle Circles/Pumps: PROM, Both, Supine, 5 reps Heel Slides: PROM, Both, Supine   Assessment/Plan    PT Assessment Patient does not need any further PT services  PT Problem List         PT Treatment Interventions      PT Goals (Current goals can be found in the Care Plan section)  Acute Rehab PT Goals Patient Stated Goal: pt unable PT Goal Formulation: All assessment and education complete, DC therapy    Frequency       Co-evaluation  Co-eval with OT             AM-PAC PT "6 Clicks" Mobility  Outcome Measure Help needed turning from your back to your side while in a flat bed without using bedrails?: Total Help needed moving from lying on your back to sitting on the side of a flat bed without using bedrails?: Total Help needed moving to and from a bed to a chair (including a wheelchair)?: Total Help needed standing up from a  chair using your arms (e.g., wheelchair or bedside chair)?: Total Help needed to walk in hospital room?: Total Help needed climbing 3-5 steps with a railing? : Total 6 Click Score: 6    End of Session   Activity Tolerance: Patient limited by fatigue;Patient limited by lethargy Patient left: in bed;with call bell/phone within reach;with nursing/sitter in room Nurse Communication: Mobility status;Need for lift equipment (use hoyer to get pt OOB) PT Visit Diagnosis: Muscle weakness (generalized) (M62.81)    Time: 1610-9604 PT Time Calculation (min) (ACUTE ONLY): 15 min   Charges:   PT Evaluation $PT Eval Low Complexity: 1 Low   PT General Charges $$ ACUTE PT VISIT: 1 Visit         Cortlan Dolin M,PT Acute Rehab Services [phone]   Bevelyn Buckles 01/01/2024, 11:07 AM

## 2024-01-02 DIAGNOSIS — B965 Pseudomonas (aeruginosa) (mallei) (pseudomallei) as the cause of diseases classified elsewhere: Secondary | ICD-10-CM

## 2024-01-02 DIAGNOSIS — Z87891 Personal history of nicotine dependence: Secondary | ICD-10-CM

## 2024-01-02 DIAGNOSIS — J90 Pleural effusion, not elsewhere classified: Secondary | ICD-10-CM | POA: Diagnosis not present

## 2024-01-02 DIAGNOSIS — Z1612 Extended spectrum beta lactamase (ESBL) resistance: Secondary | ICD-10-CM

## 2024-01-02 DIAGNOSIS — B9620 Unspecified Escherichia coli [E. coli] as the cause of diseases classified elsewhere: Secondary | ICD-10-CM

## 2024-01-02 DIAGNOSIS — J9811 Atelectasis: Secondary | ICD-10-CM

## 2024-01-02 DIAGNOSIS — J9601 Acute respiratory failure with hypoxia: Secondary | ICD-10-CM | POA: Diagnosis not present

## 2024-01-02 LAB — GLUCOSE, CAPILLARY
Glucose-Capillary: 108 mg/dL — ABNORMAL HIGH (ref 70–99)
Glucose-Capillary: 111 mg/dL — ABNORMAL HIGH (ref 70–99)
Glucose-Capillary: 114 mg/dL — ABNORMAL HIGH (ref 70–99)
Glucose-Capillary: 116 mg/dL — ABNORMAL HIGH (ref 70–99)
Glucose-Capillary: 86 mg/dL (ref 70–99)
Glucose-Capillary: 97 mg/dL (ref 70–99)

## 2024-01-02 LAB — BASIC METABOLIC PANEL
Anion gap: 4 — ABNORMAL LOW (ref 5–15)
BUN: 28 mg/dL — ABNORMAL HIGH (ref 8–23)
CO2: 33 mmol/L — ABNORMAL HIGH (ref 22–32)
Calcium: 9 mg/dL (ref 8.9–10.3)
Chloride: 106 mmol/L (ref 98–111)
Creatinine, Ser: <0.3 mg/dL — ABNORMAL LOW (ref 0.61–1.24)
Glucose, Bld: 113 mg/dL — ABNORMAL HIGH (ref 70–99)
Potassium: 4.6 mmol/L (ref 3.5–5.1)
Sodium: 143 mmol/L (ref 135–145)

## 2024-01-02 MED ORDER — FREE WATER
200.0000 mL | Status: DC
  Administered 2024-01-02 – 2024-01-08 (×33): 200 mL

## 2024-01-02 MED ORDER — GUAIFENESIN 100 MG/5ML PO LIQD
10.0000 mL | Freq: Three times a day (TID) | ORAL | Status: DC
  Administered 2024-01-02 – 2024-01-08 (×18): 10 mL
  Filled 2024-01-02 (×17): qty 10

## 2024-01-02 MED ORDER — LORATADINE 10 MG PO TABS
10.0000 mg | ORAL_TABLET | Freq: Every day | ORAL | Status: DC
  Administered 2024-01-03 – 2024-01-08 (×6): 10 mg
  Filled 2024-01-02 (×6): qty 1

## 2024-01-02 NOTE — Progress Notes (Signed)
 [facility] ROUNDING NOTE REYNOL ARNONE WUJ:[id number]  DOB: 01-Dec-1950  DOA: 12/27/2023  PCP: Michail Sermon, MD  01/02/2024,1:19 PM  LOS: 5 days    Code Status: Full   From:  [facility]   Current Dispo: back to [facility]?   73 year old male with underlying quadriplegia chronic respiratory failure on vent with trach PEG Following Obesity Chronic low back pain with postop complications of infected hardware-admitted to [facility] with MSSA bacteremia previously  3/6 admit hypoxic shortness of breath getting more winded at facility CCM consulted Sodium 139 potassium 4.1 BUN/creatinine 20/8.3 alk phos 130 albumin 3.3 WBC 25 BNP 60 RSV flu COVID all negative UA showed nitrites large leukocytes many bacteria urine culture showed E. coli portable chest x-ray low lung volume moderate to severe bibasilar atelectasis infiltrate  Acute hypoxic respiratory failure in setting of pneumonia versus acute on chronic diastolic heart failure   Plan  Acute hypoxic respiratory failure pneumonia-sputum culture growing E. coli providentia  Pseudomonas  Continuing meropenem Q8-ID to comment, continues on Bactrim DS 1 tab every 12 Expect will need acute treatment at least 7 days-do not think we need to cover for Pseudomonas beyond what has been given--Bactrim has been added to cover for potential and will complete 7 days of this on discharge Appreciative of ID input with regards to narrowing antibiotics Stop D5 water today  ESBL UTI Meropenem should cover-May require suppressive therapy but to discuss this as an outpatient  Hypernatremia during hospitalization Free water was increased to 400 every 4 Sodium was trended back down we can go to 200 every 4 and see the result  Acute metabolic encephalopathy on admission Likely related to infection as well as probable polypharmacy Continue cautiously Lexapro 5 daily,, , continue Lyrica 100 3 times daily-cautious use of fentanyl patch can use 1 mg Dilaudid every 8 for severe  pain stopped Atarax, Ativan  Hypertension Continue Norvasc 10, can use metoprolol injection 5 every 4 as needed pressure  Quadriplegia present trach PEG Reliant on ventilator is on but seems to be home settings Continue feeding supplements 50 cc/H with Osmolite and Prosource 60 mL daily as well as Juven packets 1 packet twice daily meals  DVT prophylaxis: Lovenox  Status is: Inpatient Remains inpatient appropriate because:   Requires further stool   Subjective: Nonverbal is on vent Mouths yes no answers but difficult to communicate Does not seem to have had any new issues but unable to completely discern  Objective + exam Vitals:   01/02/24 1000 01/02/24 1022 01/02/24 1108 01/02/24 1200  BP:  (!) 123/46  (!) 114/43  Pulse: 74 73  70  Resp: 18 (!) 23  16  Temp:   98.1 F (36.7 C)   TempSrc:   Oral   SpO2: 100% 100%  100%  Weight:      Height:       Filed Weights   12/27/23 1918 12/28/23 0400  Weight: 95.3 kg 90.1 kg    Examination: EOMI NCAT ill-appearing white male looks older than stated age no icterus no pallor tracheotomy and ventilator tubing noted Abdomen soft obese nontender has PEG tube in place S1-S2 PVCs noted  he has contractures lower extremity and foot drop bilaterally has Prevalon boots in place  Data Reviewed: reviewed   CBC    Component Value Date/Time   WBC 9.6 01/01/2024 0407   RBC 4.24 01/01/2024 0407   HGB 11.9 (L) 01/01/2024 0407   HCT 38.7 (L) 01/01/2024 0407   PLT 216 01/01/2024 0407  MCV 91.3 01/01/2024 0407   MCH 28.1 01/01/2024 0407   MCHC 30.7 01/01/2024 0407   RDW 18.1 (H) 01/01/2024 0407   LYMPHSABS 1.0 12/28/2023 0420   MONOABS 1.5 (H) 12/28/2023 0420   EOSABS 0.0 12/28/2023 0420   BASOSABS 0.0 12/28/2023 0420      Latest Ref Rng & Units 01/02/2024    8:43 AM 01/01/2024    4:07 AM 12/31/2023    2:13 PM  CMP  Glucose 70 - 99 mg/dL 161  096  045   BUN 8 - 23 mg/dL 28  40  42   Creatinine 0.61 - 1.24 mg/dL <4.09  <8.11   <9.14   Sodium 135 - 145 mmol/L 143  147  155   Potassium 3.5 - 5.1 mmol/L 4.6  3.9  4.2   Chloride 98 - 111 mmol/L 106  109  116   CO2 22 - 32 mmol/L 33  31  31   Calcium 8.9 - 10.3 mg/dL 9.0  8.8  9.5     Scheduled Meds:  amLODipine  10 mg Per Tube Daily   enoxaparin (LOVENOX) injection  40 mg Subcutaneous Q24H   escitalopram  5 mg Per Tube Daily   feeding supplement (PROSource TF20)  60 mL Per Tube Daily   fentaNYL  1 patch Transdermal Q72H   free water  400 mL Per Tube Q4H   guaiFENesin  10 mL Per Tube TID   insulin aspart  0-15 Units Subcutaneous Q4H   [START ON 01/03/2024] loratadine  10 mg Per Tube Daily   melatonin  10 mg Per Tube QHS   nutrition supplement (JUVEN)  1 packet Per Tube BID BM   mouth rinse  15 mL Mouth Rinse Q2H   pantoprazole (PROTONIX) IV  40 mg Intravenous QHS   pregabalin  100 mg Per Tube TID   sulfamethoxazole-trimethoprim  1 tablet Per Tube Q12H   Continuous Infusions:  dextrose 100 mL/hr at 01/02/24 1000   feeding supplement (OSMOLITE 1.5 CAL) 50 mL/hr at 01/02/24 1000   meropenem (MERREM) IV Stopped (01/02/24 0543)    Time 37  Rhetta Mura, MD  [facility]

## 2024-01-02 NOTE — Consult Note (Signed)
 [facility]    Date of Admission:  12/27/2023   Total days of inpatient antibiotics 4        Reason for Consult: 7    Principal Problem:   Acute respiratory failure with hypoxia (HCC) Active Problems:   Chronic hypoxic respiratory failure (HCC)   Ventilator dependent (HCC)   Tracheostomy dependence (HCC)   Depression   Pneumonia   Acute on chronic heart failure with preserved ejection fraction (HFpEF) (HCC)   UTI (urinary tract infection)   Acute encephalopathy   Assessment: 73 year old male with past medical history of quadriplegia, chronic hypoxic respite failure, trach and vent dependent, PEG dependent presented with shortness of breath for few hours.  On arrival he had leukocytosis found to have: #Acute on chronic respiratory failure secondary to possible pneumonia with MDR organisms #ESBL E. coli positive urine cultures - X-ray showed low lung volumes and mild to moderate bibasilar atelectasis or infiltrates and small b/l pleural effusions. - Respite cultures growing Providencia rettgeri CRE/MDM Pseudomonas aeruginosa, ESBL E. coli.  Patient initially was on doxycycline and cefepime then transition to meropenem.  ID engaged given resistant procidentia, sensitive to Bactrim. -Leukocytosis resolved prior to starting Bactrim, patient remains afebrile.  Vent settings essentially unchanged. Recommendations:  -Continue meropenem to complete to complete 7 days of antibiotics for UTI and possible pneumonia EOT which would cover PNA and UTI.  - Add Bactrim to cover providentia x 7 days - ID will sign off  Evaluation of this patient requires complex antimicrobial therapy evaluation and counseling + isolation needs for disease transmission risk assessment and mitigation   Microbiology:   Antibiotics: Ertapenem 3/9-present Doxycycline 12/27/18/9 Cefepime 3/6/C/10 Comycin 3/6 Meropenem 3/9-present Bactrim  3/11-present  Cultures: Blood  Urine 3/6 ESBL E. coli Other 3/7 respiratory cultures Providencia rettgeri CRE/MDM Pseudomonas aeruginosa, ESBL E. coli  HPI: Andrew Rivera is a 73 y.o. male with past medical history of chronic hypoxic respiratory failure on vent/trach dependent, PEG dependent, quadriplegia, hypertension, depression brought to the ED as a found hypoxic and short of breath.  Patient with noted patient he was getting worse over the last few hours.  Presented with leukocytosis.  Started on empiric antibiotics with doxycycline and cefepime for pneumonia versus heart failure exacerbation.  Blood cultures with no growth.  Urine cultures grew ESBL E. coli.  Tracheal aspirate grew ESBL E. coli and Providencia rettgeri(CRE-NDM) and Pseudomonas aeruginosa.  Infectious disease engaged.   Review of Systems: Review of Systems  Unable to perform ROS: Medical condition    Past Medical History:  Diagnosis Date   Acute on chronic respiratory failure with hypoxia (HCC)    Allergic rhinitis    Altered mental status, unspecified    Chronic pain syndrome    Critical illness myopathy    Depression    GAD (generalized anxiety disorder)    Internal jugular (IJ) vein thromboembolism, acute, unspecified laterality (HCC)    Septic shock (HCC)     Social History   Tobacco Use   Smoking status: Former    Current packs/day: 0.00    Types: Cigarettes    Quit date: 01/25/1990    Years since quitting: 33.9   Smokeless tobacco: Never  Substance Use Topics   Alcohol use: Not Currently   Drug use: Never    History reviewed. No pertinent family history. Scheduled Meds:  amLODipine  10 mg Per Tube Daily   enoxaparin (LOVENOX) injection  40 mg Subcutaneous Q24H  escitalopram  5 mg Per Tube Daily   feeding supplement (PROSource TF20)  60 mL Per Tube Daily   fentaNYL  1 patch Transdermal Q72H   free water  400 mL Per Tube Q4H   guaiFENesin  10 mL Per Tube TID   insulin aspart  0-15 Units  Subcutaneous Q4H   [START ON 01/03/2024] loratadine  10 mg Per Tube Daily   melatonin  10 mg Per Tube QHS   nutrition supplement (JUVEN)  1 packet Per Tube BID BM   mouth rinse  15 mL Mouth Rinse Q2H   pantoprazole (PROTONIX) IV  40 mg Intravenous QHS   pregabalin  100 mg Per Tube TID   sulfamethoxazole-trimethoprim  1 tablet Per Tube Q12H   Continuous Infusions:  dextrose 100 mL/hr at 01/02/24 1000   feeding supplement (OSMOLITE 1.5 CAL) 50 mL/hr at 01/02/24 1000   meropenem (MERREM) IV Stopped (01/02/24 0543)   PRN Meds:.HYDROmorphone, lip balm, metoprolol tartrate, mouth rinse Allergies  Allergen Reactions   Chlorhexidine Itching and Rash    OBJECTIVE: Blood pressure (!) 114/43, pulse 70, temperature 98.1 F (36.7 C), temperature source Oral, resp. rate 16, height 5\' 9"  (1.753 m), weight 90.1 kg, SpO2 100%.  Physical Exam Constitutional:      Appearance: He is normal weight.     Comments: On vent  HENT:     Head: Normocephalic and atraumatic.     Right Ear: External ear normal.     Left Ear: External ear normal.     Nose: No congestion or rhinorrhea.     Mouth/Throat:     Mouth: Mucous membranes are moist.     Pharynx: Oropharynx is clear.  Eyes:     Conjunctiva/sclera: Conjunctivae normal.     Pupils: Pupils are equal, round, and reactive to light.  Cardiovascular:     Rate and Rhythm: Normal rate and regular rhythm.     Heart sounds: No murmur heard.    No friction rub. No gallop.  Pulmonary:     Effort: Pulmonary effort is normal.     Breath sounds: Normal breath sounds.  Abdominal:     General: Abdomen is flat. Bowel sounds are normal.     Palpations: Abdomen is soft.  Musculoskeletal:        General: No swelling.     Cervical back: Neck supple.  Skin:    General: Skin is warm and dry.  Neurological:     General: No focal deficit present.     Mental Status: He is oriented to person, place, and time.  Psychiatric:        Mood and Affect: Mood normal.      Lab Results Lab Results  Component Value Date   WBC 9.6 01/01/2024   HGB 11.9 (L) 01/01/2024   HCT 38.7 (L) 01/01/2024   MCV 91.3 01/01/2024   PLT 216 01/01/2024    Lab Results  Component Value Date   CREATININE <0.30 (L) 01/02/2024   BUN 28 (H) 01/02/2024   NA 143 01/02/2024   K 4.6 01/02/2024   CL 106 01/02/2024   CO2 33 (H) 01/02/2024    Lab Results  Component Value Date   ALT 26 12/28/2023   AST 27 12/28/2023   ALKPHOS 129 (H) 12/28/2023   BILITOT 1.1 12/28/2023       Danelle Earthly, MD [facility] 01/02/2024, 1:17 PM

## 2024-01-02 NOTE — Progress Notes (Signed)
 Nutrition Follow-up  DOCUMENTATION CODES:   Not applicable  INTERVENTION:  TF via PEG tube: Osmolite 1.5 at 29ml/hr ( per day) 60ml ProSource TF20 once daily Provides 1960 kcal, 115g protein, free water daily   Juven BID per tube   free water flushes q4h -per MD ( per day) Total free water flush: per day (TF+ FWF)  NUTRITION DIAGNOSIS:   Increased nutrient needs related to acute illness as evidenced by estimated needs. - remains applicable  GOAL:   Patient will meet greater than or equal to 90% of their needs - goal met via TF  MONITOR:   Labs, Weight trends, Vent status, TF tolerance, Skin  REASON FOR ASSESSMENT:   Ventilator    ASSESSMENT:   Pt admitted from [facility] with hypoxia and confusion likely secondary to PNA and acute on chronic diastolic CHF. PMH significant for chronic hypoxic respiratory failure, trach and PEG dependence, quadriplegic, HTN and depression.  Patient remains on ventilator support via trach. MV: 8.7 L/min Temp (24hrs), Avg:97.4 F (36.3 C), Min:96.2 F (35.7 C), Max:97.8 F (36.6 C)  Pt resting comfortably in bed at time of visit.  TF infusing at goal via PEG. Tolerating well.  Awaiting insurance auth for return to [facility].   Free water flushes increased d/t hypernatremia.   Drains/lines: UOP: x24 hours  Medications: SSI 0-15 units q4h, melatonin  Labs:   Sodium 147 BUN 40 Cr <0.03 CBG's 114-124 x24 hours  Diet Order:   Diet Order     None      EDUCATION NEEDS:  No education needs have been identified at this time  Skin:  Skin Assessment: Skin Integrity Issues: Skin Integrity Issues:: Other (Comment) Other: IAD buttocks  Last BM:  3/11 x2 type 6 large/medium  Height:  Ht Readings from Last 1 Encounters:  12/27/23 5\' 9"  (1.753 m)    Weight:  Wt Readings from Last 1 Encounters:  12/28/23 90.1 kg   BMI:  Body mass index is 29.33 kg/m.  Estimated Nutritional Needs:    Kcal:  1800-2000  Protein:  105-120g  Fluid:  >/=1.8L  Drusilla Kanner, RDN, LDN Clinical Nutrition

## 2024-01-02 NOTE — Progress Notes (Signed)
 eLink Physician-Brief Progress Note Patient Name: Andrew Rivera DOB: [birth year] MRN: [id number]   Date of Service  01/02/2024  HPI/Events of Note  Tracheostomy appears to be partially dislodged.  eICU Interventions  Ground crew requested to assess tracheostomy at bedside.        Thomasene Lot Freddi Forster 01/02/2024, 9:06 PM

## 2024-01-03 ENCOUNTER — Other Ambulatory Visit (HOSPITAL_COMMUNITY): Payer: Self-pay

## 2024-01-03 DIAGNOSIS — J9601 Acute respiratory failure with hypoxia: Secondary | ICD-10-CM | POA: Diagnosis not present

## 2024-01-03 LAB — GLUCOSE, CAPILLARY
Glucose-Capillary: 108 mg/dL — ABNORMAL HIGH (ref 70–99)
Glucose-Capillary: 108 mg/dL — ABNORMAL HIGH (ref 70–99)
Glucose-Capillary: 112 mg/dL — ABNORMAL HIGH (ref 70–99)
Glucose-Capillary: 116 mg/dL — ABNORMAL HIGH (ref 70–99)
Glucose-Capillary: 127 mg/dL — ABNORMAL HIGH (ref 70–99)
Glucose-Capillary: 90 mg/dL (ref 70–99)

## 2024-01-03 LAB — CBC WITH DIFFERENTIAL/PLATELET
Abs Immature Granulocytes: 0.4 10*3/uL — ABNORMAL HIGH (ref 0.00–0.07)
Basophils Absolute: 0.1 10*3/uL (ref 0.0–0.1)
Basophils Relative: 1 %
Eosinophils Absolute: 0.3 10*3/uL (ref 0.0–0.5)
Eosinophils Relative: 3 %
HCT: 35.5 % — ABNORMAL LOW (ref 39.0–52.0)
Hemoglobin: 11.4 g/dL — ABNORMAL LOW (ref 13.0–17.0)
Immature Granulocytes: 4 %
Lymphocytes Relative: 11 %
Lymphs Abs: 1.2 10*3/uL (ref 0.7–4.0)
MCH: 28.1 pg (ref 26.0–34.0)
MCHC: 32.1 g/dL (ref 30.0–36.0)
MCV: 87.4 fL (ref 80.0–100.0)
Monocytes Absolute: 0.8 10*3/uL (ref 0.1–1.0)
Monocytes Relative: 8 %
Neutro Abs: 7.8 10*3/uL — ABNORMAL HIGH (ref 1.7–7.7)
Neutrophils Relative %: 73 %
Platelets: 227 10*3/uL (ref 150–400)
RBC: 4.06 MIL/uL — ABNORMAL LOW (ref 4.22–5.81)
RDW: 17.4 % — ABNORMAL HIGH (ref 11.5–15.5)
WBC: 10.6 10*3/uL — ABNORMAL HIGH (ref 4.0–10.5)
nRBC: 0.2 % (ref 0.0–0.2)

## 2024-01-03 LAB — MAGNESIUM: Magnesium: 2.1 mg/dL (ref 1.7–2.4)

## 2024-01-03 LAB — BASIC METABOLIC PANEL
Anion gap: 8 (ref 5–15)
BUN: 27 mg/dL — ABNORMAL HIGH (ref 8–23)
CO2: 31 mmol/L (ref 22–32)
Calcium: 9.1 mg/dL (ref 8.9–10.3)
Chloride: 106 mmol/L (ref 98–111)
Creatinine, Ser: <0.3 mg/dL — ABNORMAL LOW (ref 0.61–1.24)
Glucose, Bld: 105 mg/dL — ABNORMAL HIGH (ref 70–99)
Potassium: 4.5 mmol/L (ref 3.5–5.1)
Sodium: 145 mmol/L (ref 135–145)

## 2024-01-03 LAB — PHOSPHORUS: Phosphorus: 2.4 mg/dL — ABNORMAL LOW (ref 2.5–4.6)

## 2024-01-03 MED ORDER — ESCITALOPRAM OXALATE 5 MG PO TABS
5.0000 mg | ORAL_TABLET | Freq: Every day | ORAL | 0 refills | Status: DC
  Filled 2024-01-03: qty 12, 12d supply, fill #0

## 2024-01-03 MED ORDER — SULFAMETHOXAZOLE-TRIMETHOPRIM 800-160 MG PO TABS
1.0000 | ORAL_TABLET | Freq: Two times a day (BID) | ORAL | 0 refills | Status: DC
  Filled 2024-01-03: qty 10, 5d supply, fill #0

## 2024-01-03 MED ORDER — PREGABALIN 100 MG PO CAPS: 100.0000 mg | ORAL_CAPSULE | Freq: Three times a day (TID) | ORAL | 0 refills | Status: DC

## 2024-01-03 MED ORDER — FENTANYL 25 MCG/HR TD PT72: 1.0000 | MEDICATED_PATCH | TRANSDERMAL | 0 refills | Status: DC

## 2024-01-03 MED ORDER — LORAZEPAM 0.5 MG PO TABS: 0.5000 mg | ORAL_TABLET | Freq: Two times a day (BID) | ORAL | 0 refills | Status: DC | PRN

## 2024-01-03 MED ORDER — WHITE PETROLATUM EX OINT
TOPICAL_OINTMENT | CUTANEOUS | Status: DC | PRN
  Filled 2024-01-03: qty 28.35

## 2024-01-03 MED ORDER — HYDROMORPHONE HCL 2 MG PO TABS: 1.0000 mg | ORAL_TABLET | Freq: Three times a day (TID) | ORAL | 0 refills | Status: DC | PRN

## 2024-01-03 MED ORDER — ERTAPENEM SODIUM 1 G IJ SOLR: 1.0000 g | INTRAMUSCULAR | Status: DC

## 2024-01-03 MED ORDER — FREE WATER: 200.0000 mL | Status: DC

## 2024-01-03 MED ORDER — GUAIFENESIN 100 MG/5ML PO LIQD: 10.0000 mL | Freq: Three times a day (TID) | ORAL | Status: AC

## 2024-01-03 NOTE — TOC Progression Note (Signed)
 Transition of Care [facility]) - Progression Note    Patient Details  Name: Andrew Rivera MRN: [id number] Date of Birth: 17-May-1951  [facility] CM/SW Contact  Tom-Johnson, Hershal Coria, RN Phone Number: 01/03/2024, 3:39 PM  Clinical Narrative:     Insurance auth and P2P denied for [employer]. Patient to return to [facility]. CM called and spoke with Marylene Land, no bed available at this time. CM will be notified when a bed becomes available. MD notified.   CM will continue to follow.        Expected Discharge Plan and Services         Expected Discharge Date: 01/03/24                                     Social Determinants of Health (SDOH) Interventions SDOH Screenings   Food Insecurity: Patient Unable To Answer (12/29/2023)  Housing: Patient Unable To Answer (12/29/2023)  Transportation Needs: Patient Unable To Answer (12/29/2023)  Utilities: Patient Unable To Answer (12/29/2023)  Social Connections: Patient Unable To Answer (12/29/2023)  Tobacco Use: Medium Risk (12/28/2023)    Readmission Risk Interventions    09/10/2023    3:49 PM  Readmission Risk Prevention Plan  Transportation Screening Complete  PCP or Specialist Appt within 5-7 Days Complete  Home Care Screening Complete  Medication Review (RN CM) Complete

## 2024-01-03 NOTE — Plan of Care (Signed)
  Problem: Education: Goal: Knowledge of General Education information will improve Description: Including pain rating scale, medication(s)/side effects and non-pharmacologic comfort measures Outcome: Progressing   Problem: Clinical Measurements: Goal: Ability to maintain clinical measurements within normal limits will improve Outcome: Progressing Goal: Will remain free from infection Outcome: Progressing Goal: Diagnostic test results will improve Outcome: Progressing Goal: Respiratory complications will improve Outcome: Progressing   Problem: Activity: Goal: Risk for activity intolerance will decrease Outcome: Progressing   Problem: Nutrition: Goal: Adequate nutrition will be maintained Outcome: Progressing   Problem: Coping: Goal: Level of anxiety will decrease Outcome: Progressing   Problem: Elimination: Goal: Will not experience complications related to bowel motility Outcome: Progressing Goal: Will not experience complications related to urinary retention Outcome: Progressing   Problem: Pain Managment: Goal: General experience of comfort will improve and/or be controlled Outcome: Progressing   Problem: Safety: Goal: Ability to remain free from injury will improve Outcome: Progressing   Problem: Skin Integrity: Goal: Risk for impaired skin integrity will decrease Outcome: Progressing   Problem: Coping: Goal: Ability to adjust to condition or change in health will improve Outcome: Progressing   Problem: Fluid Volume: Goal: Ability to maintain a balanced intake and output will improve Outcome: Progressing   Problem: Health Behavior/Discharge Planning: Goal: Ability to identify and utilize available resources and services will improve Outcome: Progressing Goal: Ability to manage health-related needs will improve Outcome: Progressing   Problem: Metabolic: Goal: Ability to maintain appropriate glucose levels will improve Outcome: Progressing   Problem:  Nutritional: Goal: Maintenance of adequate nutrition will improve Outcome: Progressing Goal: Progress toward achieving an optimal weight will improve Outcome: Progressing   Problem: Skin Integrity: Goal: Risk for impaired skin integrity will decrease Outcome: Progressing

## 2024-01-03 NOTE — Discharge Summary (Addendum)
 Physician Discharge Summary  Andrew Rivera ION:[id number] DOB: May 30, 1951 DOA: 12/27/2023  PCP: Michail Sermon, MD  Admit date: 12/27/2023 Discharge date: 01/03/2024  Time spent: 46 minutes  Recommendations for Outpatient Follow-up:  Requires Chem-12 CBC in about 1 week Get CXR in 3 weeks to denote clearing of pneumonia Complete Bactrim for providentia as per order---  Also needs to complete invanz 01/05/24 for ESBL bacteremia/pneumonia Note increasing free water as below Will need definitive management of trach/change out to larger size because of large stoma  Discharge Diagnoses:  MAIN problem for hospitalization   Sepsis secondary to ESBL bacteremia Provident sherrill urinary infection/colonization  Please see below for itemized issues addressed in HOpsital- refer to other progress notes for clarity if needed  Discharge Condition: Fair  Diet recommendation: PEG feeds  Filed Weights   12/27/23 1918 12/28/23 0400  Weight: 95.3 kg 90.1 kg    History of present illness:  73 year old male with underlying quadriplegia secondary to underlying chronic back surgeries and eventual debility Chronic respiratory failure on vent with trach PEG-usually on FiO2 40 respiratory rate 16 PEEP of 5 Obesity Chronic low back pain with postop complications of infected hardware-admitted to [facility] with MSSA bacteremia previously   3/6 admit hypoxic shortness of breath getting more winded at facility CCM consulted Sodium 139 potassium 4.1 BUN/creatinine 20/0.3 alk phos 130 albumin 3.3 WBC 25 BNP 60 RSV flu COVID all negative UA showed nitrites large leukocytes many bacteria urine culture showed E. coli  portable chest x-ray low lung volume moderate to severe bibasilar atelectasis infiltrate   Acute hypoxic respiratory failure in setting of pneumonia versus acute on chronic diastolic heart failure Eventually found to have bacteremia with ESBL Trach aspirate grew E. coli, [place] Rettgeri, Pseudomonas  aeruginosa additionally ID was consulted   Plan   Acute hypoxic respiratory failure pneumonia-sputum culture growing E. coli providentia  Pseudomonas  Antibiotic for narrowed by ID from cefepime and doxycycline to meropenem as well as Bactrim Patient will complete meropenem on 3/15 for the ESBL organism and this can be changed to Invanz once a day administration ID recommended completion of Bactrim for 7 days to complete therapy with ending on 3/17 Sepsis physiology resolved   Sepsis secondary to polymicrobial trach aspirate showing multiple organisms ESBL UTI Meropenem transition to Invanz at discharge Bactrim to finish on 3/17 as below   Hypernatremia during hospitalization-trended up to 156 Free water was increased to 400 every 4 earlier in hospital stay and sodium resolved Sodium was trended back down we can go to 200 every 4 and will need additional labs periodically   Acute metabolic encephalopathy on admission Likely related to infection as well as probable polypharmacy Continue cautiously Lexapro 5 daily, continue Lyrica 100 3 times daily-cautious use of fentanyl patch can use 1 mg Dilaudid every 8 for severe pain p.o. stopped Atarax, Ativan earlier in hospital stay but have resumed at discharge   Hypertension Continue Norvasc 10, can use metoprolol injection 5 every 4 as needed pressure   Quadriplegia present trach PEG Reliant on ventilator is on but seems to be home settings--the trach seems to be in place but there is a large stoma with a large air leak and they have to inflate the cuff Recommend outpatient follow-up Continue feeding supplements 50 cc/H with Osmolite and Prosource 60 mL daily as well as Juven packets 1 packet twice daily meals   Discharge Exam: Vitals:   01/03/24 0746 01/03/24 0800  BP: (!) 104/43 (!) 134/49  Pulse:  78 78  Resp: 16 16  Temp:    SpO2: 98% 98%    Subj on day of d/c   He is awake but nonverbal He is able to mouth what he wishes  to say and responds appropriately No specific complaints or concerns from nursing Note that the cuff seem to have an air leak yesterday but is in place  General Exam on discharge  EOMI NCAT no focal deficit no icterus no pallor ROM intact Patient is unable to lift arms above bed and antigravity muscles do not know if he is a little swollen bilaterally PEG tube in place infusing feeds   Discharge Instructions   Discharge Instructions     Diet - low sodium heart healthy   Complete by: As directed    Increase activity slowly   Complete by: As directed    No wound care   Complete by: As directed       Allergies as of 01/03/2024       Reactions   Chlorhexidine Itching, Rash        Medication List     STOP taking these medications    furosemide 20 MG tablet Commonly known as: LASIX       TAKE these medications    acetaminophen 325 MG tablet Commonly known as: TYLENOL Place 975 mg into feeding tube in the morning and at bedtime.   amLODipine 10 MG tablet Commonly known as: NORVASC Place 1 tablet (10 mg total) into feeding tube daily.   Carboxymethylcellulose Sodium 1 % Gel Place 1 drop into both eyes in the morning, at noon, and at bedtime.   ertapenem 1 g injection Commonly known as: INVanz Inject 1,000 mg (1 g total) into the muscle daily for 2 days.   escitalopram 5 MG tablet Commonly known as: LEXAPRO Place 1 tablet (5 mg total) into feeding tube daily.   feeding supplement (OSMOLITE 1.5 CAL) Liqd Place 50 mL/hr into feeding tube continuous.   feeding supplement (PROSource TF20) liquid Place 60 mLs into feeding tube daily.   fentaNYL 25 MCG/HR Commonly known as: DURAGESIC Place 1 patch onto the skin every 3 (three) days.   free water Soln Place 200 mLs into feeding tube every 4 (four) hours. What changed: how much to take   guaiFENesin 100 MG/5ML liquid Commonly known as: ROBITUSSIN Place 10 mLs into feeding tube 3 (three) times daily.    HYDROmorphone 2 MG tablet Commonly known as: DILAUDID Place 0.5 tablets (1 mg total) into feeding tube every 8 (eight) hours as needed for severe pain (pain score 7-10).   hydrOXYzine 25 MG tablet Commonly known as: ATARAX Place 25 mg into feeding tube 3 (three) times daily.   ipratropium-albuterol 0.5-2.5 (3) MG/3ML Soln Commonly known as: DUONEB Take 3 mLs by nebulization every 4 (four) hours as needed (respiratory failure).   levocetirizine 5 MG tablet Commonly known as: XYZAL Place 5 mg into feeding tube every evening.   LORazepam 0.5 MG tablet Commonly known as: ATIVAN Place 1 tablet (0.5 mg total) into feeding tube every 12 (twelve) hours as needed for anxiety.   Melatonin 10 MG Tabs Give 10 mg by tube at bedtime.   ondansetron 4 MG disintegrating tablet Commonly known as: ZOFRAN-ODT Take 4 mg by mouth every 8 (eight) hours as needed for nausea or vomiting.   pantoprazole sodium 40 mg Commonly known as: PROTONIX Place 40 mg into feeding tube daily.   polyethylene glycol 17 g packet Commonly known as: MIRALAX /  GLYCOLAX Place 17 g into feeding tube 2 (two) times daily. What changed: when to take this   potassium chloride 20 MEQ/15ML (10%) Soln Place 20 mEq into feeding tube daily.   pregabalin 100 MG capsule Commonly known as: LYRICA Place 1 capsule (100 mg total) into feeding tube 3 (three) times daily.   scopolamine 1 MG/3DAYS Commonly known as: TRANSDERM-SCOP Place 1 patch onto the skin every 3 (three) days.   sulfamethoxazole-trimethoprim 800-160 MG tablet Commonly known as: BACTRIM DS Place 1 tablet into feeding tube every 12 (twelve) hours for 5 days.       Allergies  Allergen Reactions   Chlorhexidine Itching and Rash      The results of significant diagnostics from this hospitalization (including imaging, microbiology, ancillary and laboratory) are listed below for reference.    Significant Diagnostic Studies: DG Chest Portable 1  View Result Date: 12/27/2023 CLINICAL DATA:  Shortness of breath EXAM: PORTABLE CHEST 1 VIEW COMPARISON:  September 11, 2023 FINDINGS: There is stable tracheostomy tube positioning. The cardiac silhouette is mildly enlarged and unchanged in size. Low lung volumes are noted with mild, diffusely increased lung markings. Mild to moderate severity bibasilar atelectasis and/or infiltrate is seen. Small bilateral pleural effusions are noted. No pneumothorax is identified. Postoperative changes seen throughout the cervical spine and upper thoracic spine. IMPRESSION: 1. Low lung volumes with mild to moderate severity bibasilar atelectasis and/or infiltrate. 2. Small bilateral pleural effusions. Electronically Signed   By: Aram Candela M.D.   On: 12/27/2023 22:45    Microbiology: Recent Results (from the past 240 hours)  Resp panel by RT-PCR (RSV, Flu A&B, Covid) Anterior Nasal Swab     Status: None   Collection Time: 12/27/23  8:25 PM   Specimen: Anterior Nasal Swab  Result Value Ref Range Status   SARS Coronavirus 2 by RT PCR NEGATIVE NEGATIVE Final   Influenza A by PCR NEGATIVE NEGATIVE Final   Influenza B by PCR NEGATIVE NEGATIVE Final    Comment: (NOTE) The Xpert Xpress SARS-CoV-2/FLU/RSV plus assay is intended as an aid in the diagnosis of influenza from Nasopharyngeal swab specimens and should not be used as a sole basis for treatment. Nasal washings and aspirates are unacceptable for Xpert Xpress SARS-CoV-2/FLU/RSV testing.  Fact Sheet for Patients: BloggerCourse.com  Fact Sheet for Healthcare Providers: SeriousBroker.it  This test is not yet approved or cleared by the [place] FDA and has been authorized for detection and/or diagnosis of SARS-CoV-2 by FDA under an Emergency Use Authorization (EUA). This EUA will remain in effect (meaning this test can be used) for the duration of the COVID-19 declaration under Section 564(b)(1) of  the Act, 21 U.S.C. section 360bbb-3(b)(1), unless the authorization is terminated or revoked.     Resp Syncytial Virus by PCR NEGATIVE NEGATIVE Final    Comment: (NOTE) Fact Sheet for Patients: BloggerCourse.com  Fact Sheet for Healthcare Providers: SeriousBroker.it  This test is not yet approved or cleared by the [place] FDA and has been authorized for detection and/or diagnosis of SARS-CoV-2 by FDA under an Emergency Use Authorization (EUA). This EUA will remain in effect (meaning this test can be used) for the duration of the COVID-19 declaration under Section 564(b)(1) of the Act, 21 U.S.C. section 360bbb-3(b)(1), unless the authorization is terminated or revoked.  Performed at [facility], [address] [place], [place] [address]   Blood culture (routine x 2)     Status: None   Collection Time: 12/27/23 10:00 PM  Specimen: BLOOD RIGHT ARM  Result Value Ref Range Status   Specimen Description BLOOD RIGHT ARM  Final   Special Requests   Final    BOTTLES DRAWN AEROBIC AND ANAEROBIC Blood Culture adequate volume   Culture   Final    NO GROWTH 5 DAYS Performed at [facility], [address], [place], [place] [address]    Report Status 01/01/2024 FINAL  Final  Blood culture (routine x 2)     Status: None   Collection Time: 12/27/23 10:25 PM   Specimen: BLOOD LEFT ARM  Result Value Ref Range Status   Specimen Description BLOOD LEFT ARM  Final   Special Requests   Final    BOTTLES DRAWN AEROBIC AND ANAEROBIC Blood Culture adequate volume   Culture   Final    NO GROWTH 5 DAYS Performed at [facility], [address], [place], [place] [address]    Report Status 01/01/2024 FINAL  Final  Urine Culture     Status: Abnormal   Collection Time: 12/27/23 11:10 PM   Specimen: Urine, Random  Result Value Ref Range Status   Specimen Description URINE, RANDOM  Final   Special Requests NONE Reflexed  from [id number]  Final   Culture (A)  Final    >=100,000 COLONIES/mL ESCHERICHIA COLI Confirmed Extended Spectrum Beta-Lactamase Producer (ESBL).  In bloodstream infections from ESBL organisms, carbapenems are preferred over piperacillin/tazobactam. They are shown to have a lower risk of mortality.    Report Status 12/30/2023 FINAL  Final   Organism ID, Bacteria ESCHERICHIA COLI (A)  Final      Susceptibility   Escherichia coli - MIC*    AMPICILLIN >=32 RESISTANT Resistant     CEFAZOLIN >=64 RESISTANT Resistant     CEFEPIME >=32 RESISTANT Resistant     CEFTRIAXONE >=64 RESISTANT Resistant     CIPROFLOXACIN >=4 RESISTANT Resistant     GENTAMICIN <=1 SENSITIVE Sensitive     IMIPENEM <=0.25 SENSITIVE Sensitive     NITROFURANTOIN <=16 SENSITIVE Sensitive     TRIMETH/SULFA <=20 SENSITIVE Sensitive     AMPICILLIN/SULBACTAM >=32 RESISTANT Resistant     PIP/TAZO <=4 SENSITIVE Sensitive ug/mL    * >=100,000 COLONIES/mL ESCHERICHIA COLI  MRSA Next Gen by PCR, Nasal     Status: None   Collection Time: 12/28/23  3:40 AM   Specimen: Nasal Mucosa; Nasal Swab  Result Value Ref Range Status   MRSA by PCR Next Gen NOT DETECTED NOT DETECTED Final    Comment: (NOTE) The GeneXpert MRSA Assay (FDA approved for NASAL specimens only), is one component of a comprehensive MRSA colonization surveillance program. It is not intended to diagnose MRSA infection nor to guide or monitor treatment for MRSA infections. Test performance is not FDA approved in patients less than 55 years old. Performed at [facility], [address], [place], [place] [address]   Culture, Respiratory w Gram Stain     Status: None   Collection Time: 12/28/23  3:51 AM   Specimen: Tracheal Aspirate  Result Value Ref Range Status   Specimen Description TRACHEAL ASPIRATE  Final   Special Requests NONE  Final   Gram Stain   Final    MODERATE WBC PRESENT,BOTH PMN AND MONONUCLEAR FEW GRAM NEGATIVE RODS Performed at [facility], [address] [place], [place] [address]    Culture   Final    MODERATE ESCHERICHIA COLI Confirmed Extended Spectrum Beta-Lactamase Producer (ESBL).  In bloodstream infections from  ESBL organisms, carbapenems are preferred over piperacillin/tazobactam. They are shown to have a lower risk of mortality. MODERATE PROVIDENCIA RETTGERI CONFIRMED CARBAPENEMASE RESISTANT ENTEROBACTERIACAE CRITICAL RESULT CALLED TO, READ BACK BY AND VERIFIED WITH: E.EDGES AT 1400 ON 01/01/2024 BY T.SAAD. MODERATE PSEUDOMONAS AERUGINOSA    Report Status 01/02/2024 FINAL  Final   Organism ID, Bacteria ESCHERICHIA COLI  Final   Organism ID, Bacteria PROVIDENCIA RETTGERI  Final   Organism ID, Bacteria PSEUDOMONAS AERUGINOSA  Final      Susceptibility   Escherichia coli - MIC*    AMPICILLIN >=32 RESISTANT Resistant     CEFEPIME 16 RESISTANT Resistant     CEFTAZIDIME RESISTANT Resistant     CEFTRIAXONE >=64 RESISTANT Resistant     CIPROFLOXACIN >=4 RESISTANT Resistant     GENTAMICIN <=1 SENSITIVE Sensitive     IMIPENEM <=0.25 SENSITIVE Sensitive     TRIMETH/SULFA <=20 SENSITIVE Sensitive     AMPICILLIN/SULBACTAM 16 INTERMEDIATE Intermediate     PIP/TAZO <=4 SENSITIVE Sensitive ug/mL    * MODERATE ESCHERICHIA COLI   Pseudomonas aeruginosa - MIC*    CEFTAZIDIME 32 RESISTANT Resistant     CIPROFLOXACIN <=0.25 SENSITIVE Sensitive     GENTAMICIN 8 INTERMEDIATE Intermediate     IMIPENEM 2 SENSITIVE Sensitive     PIP/TAZO 16 SENSITIVE Sensitive ug/mL    CEFEPIME RESISTANT Resistant     * MODERATE PSEUDOMONAS AERUGINOSA   Providencia rettgeri - MIC*    AMPICILLIN >=32 RESISTANT Resistant     CEFEPIME 16 RESISTANT Resistant     CEFTAZIDIME 32 RESISTANT Resistant     CEFTRIAXONE 2 INTERMEDIATE Intermediate     CIPROFLOXACIN 0.5 INTERMEDIATE Intermediate     GENTAMICIN 8 INTERMEDIATE Intermediate     IMIPENEM >=16 RESISTANT Resistant     TRIMETH/SULFA <=20 SENSITIVE Sensitive     AMPICILLIN/SULBACTAM  >=32 RESISTANT Resistant     PIP/TAZO 8 SENSITIVE Sensitive ug/mL    * MODERATE PROVIDENCIA RETTGERI  Carbapenem Resistance Panel     Status: Abnormal   Collection Time: 12/28/23  3:51 AM  Result Value Ref Range Status   Carba Resistance IMP Gene NOT DETECTED NOT DETECTED Final   Carba Resistance VIM Gene NOT DETECTED NOT DETECTED Final   Carba Resistance NDM Gene DETECTED (A) NOT DETECTED Final    Comment: CRITICAL RESULT CALLED TO, READ BACK BY AND VERIFIED WITH: E.EDGES AT 1400 ON 01/01/2024 BY T.SAAD.    Carba Resistance KPC Gene NOT DETECTED NOT DETECTED Final   Carba Resistance OXA48 Gene NOT DETECTED NOT DETECTED Final    Comment: (NOTE) Cepheid Carba-R is an FDA-cleared nucleic acid amplification test  (NAAT)for the detection and differentiation of genes encoding the  most prevalent carbapenemases in bacterial isolate samples. Carbapenemase gene identification and implementation of comprehensive  infection control measures are recommended by the CDC to prevent the  spread of the resistant organisms. Performed at [facility], [address] [place], [place] [address]      Labs: Basic Metabolic Panel: Recent Labs  Lab 12/28/23 [id number] 12/29/23 0354 12/31/23 0319 12/31/23 1413 01/01/24 0407 01/02/24 0843 01/03/24 0437  NA  --    < > 156* 155* 147* 143 145  K  --    < > 4.2 4.2 3.9 4.6 4.5  CL  --    < > 117* 116* 109 106 106  CO2  --    < > 34* 31 31 33* 31  GLUCOSE  --    < > 126* 174* 128* 113* 105*  BUN  --    < > 51* 42* 40* 28* 27*  CREATININE  --    < > <0.30* <0.30* <0.30* <0.30* <0.30*  CALCIUM  --    < > 9.7 9.5 8.8* 9.0 9.1  MG 1.9  --   --   --   --   --  2.1  PHOS  --   --   --   --   --   --  2.4*   < > = values in this interval not displayed.   Liver Function Tests: Recent Labs  Lab 12/27/23 1958 12/28/23 0420  AST 22 27  ALT 23 26  ALKPHOS 130* 129*  BILITOT 0.4 1.1  PROT 8.3* 8.4*  ALBUMIN 3.3* 3.5   No results for input(s):  "LIPASE", "AMYLASE" in the last 168 hours. No results for input(s): "AMMONIA" in the last 168 hours. CBC: Recent Labs  Lab 12/27/23 1958 12/28/23 0420 12/29/23 0354 12/30/23 0422 12/31/23 0319 01/01/24 0407 01/03/24 0437  WBC 20.1* 25.7* 21.4* 17.6* 12.5* 9.6 10.6*  NEUTROABS 18.4* 23.1*  --   --   --   --  7.8*  HGB 13.6 14.1 12.2* 12.5* 11.9* 11.9* 11.4*  HCT 42.4 43.3 38.1* 39.5 39.2 38.7* 35.5*  MCV 87.4 87.1 88.0 89.4 92.0 91.3 87.4  PLT 290 310 249 269 253 216 227   Cardiac Enzymes: No results for input(s): "CKTOTAL", "CKMB", "CKMBINDEX", "TROPONINI" in the last 168 hours. BNP: BNP (last 3 results) Recent Labs    09/11/23 1737 12/27/23 1958  BNP 100.9* 60.2    ProBNP (last 3 results) No results for input(s): "PROBNP" in the last 8760 hours.  CBG: Recent Labs  Lab 01/02/24 1532 01/02/24 2004 01/02/24 2334 01/03/24 0332 01/03/24 0720  GLUCAP 111* 97 86 90 108*    Signed:  Rhetta Mura MD   [facility] 01/03/2024, 9:40 AM

## 2024-01-04 LAB — CBC WITH DIFFERENTIAL/PLATELET
Abs Immature Granulocytes: 0.32 10*3/uL — ABNORMAL HIGH (ref 0.00–0.07)
Basophils Absolute: 0 10*3/uL (ref 0.0–0.1)
Basophils Relative: 1 %
Eosinophils Absolute: 0.3 10*3/uL (ref 0.0–0.5)
Eosinophils Relative: 4 %
HCT: 35.6 % — ABNORMAL LOW (ref 39.0–52.0)
Hemoglobin: 11.5 g/dL — ABNORMAL LOW (ref 13.0–17.0)
Immature Granulocytes: 4 %
Lymphocytes Relative: 15 %
Lymphs Abs: 1.2 10*3/uL (ref 0.7–4.0)
MCH: 27.9 pg (ref 26.0–34.0)
MCHC: 32.3 g/dL (ref 30.0–36.0)
MCV: 86.4 fL (ref 80.0–100.0)
Monocytes Absolute: 0.8 10*3/uL (ref 0.1–1.0)
Monocytes Relative: 10 %
Neutro Abs: 5.5 10*3/uL (ref 1.7–7.7)
Neutrophils Relative %: 66 %
Platelets: 207 10*3/uL (ref 150–400)
RBC: 4.12 MIL/uL — ABNORMAL LOW (ref 4.22–5.81)
RDW: 17.7 % — ABNORMAL HIGH (ref 11.5–15.5)
WBC: 8.1 10*3/uL (ref 4.0–10.5)
nRBC: 0.2 % (ref 0.0–0.2)

## 2024-01-04 LAB — BASIC METABOLIC PANEL
Anion gap: 7 (ref 5–15)
BUN: 28 mg/dL — ABNORMAL HIGH (ref 8–23)
CO2: 29 mmol/L (ref 22–32)
Calcium: 8.8 mg/dL — ABNORMAL LOW (ref 8.9–10.3)
Chloride: 108 mmol/L (ref 98–111)
Creatinine, Ser: <0.3 mg/dL — ABNORMAL LOW (ref 0.61–1.24)
Glucose, Bld: 113 mg/dL — ABNORMAL HIGH (ref 70–99)
Potassium: 4.2 mmol/L (ref 3.5–5.1)
Sodium: 144 mmol/L (ref 135–145)

## 2024-01-04 LAB — GLUCOSE, CAPILLARY
Glucose-Capillary: 97 mg/dL (ref 70–99)
Glucose-Capillary: 101 mg/dL — ABNORMAL HIGH (ref 70–99)
Glucose-Capillary: 123 mg/dL — ABNORMAL HIGH (ref 70–99)
Glucose-Capillary: 110 mg/dL — ABNORMAL HIGH (ref 70–99)
Glucose-Capillary: 112 mg/dL — ABNORMAL HIGH (ref 70–99)

## 2024-01-04 NOTE — TOC Progression Note (Signed)
 Transition of Care [facility]) - Progression Note    Patient Details  Name: KHARI MALLY MRN: [id number] Date of Birth: 12-May-1951  Transition of Care [facility]) CM/SW Contact  Tom-Johnson, Hershal Coria, RN Phone Number: 01/04/2024, 11:08 AM  Clinical Narrative:     CM spoke with Marylene Land at [facility], no bed available today. Will update when one becomes available.  CM updated wife, [name].   CM will continue to follow with discharge needs.          Expected Discharge Plan and Services         Expected Discharge Date: 01/03/24                                     Social Determinants of Health (SDOH) Interventions SDOH Screenings   Food Insecurity: Patient Unable To Answer (12/29/2023)  Housing: Patient Unable To Answer (12/29/2023)  Transportation Needs: Patient Unable To Answer (12/29/2023)  Utilities: Patient Unable To Answer (12/29/2023)  Social Connections: Patient Unable To Answer (12/29/2023)  Tobacco Use: Medium Risk (12/28/2023)    Readmission Risk Interventions    09/10/2023    3:49 PM  Readmission Risk Prevention Plan  Transportation Screening Complete  PCP or Specialist Appt within 5-7 Days Complete  Home Care Screening Complete  Medication Review (RN CM) Complete

## 2024-01-04 NOTE — Progress Notes (Signed)
 Patient seen and examined and no future changes to last assessment he is nonverbal but is able to indicate things by mouthing No other issues with trach overnight Awaiting discharge disposition to vent - SNF  No charge   Pleas Koch, MD [facility] 12:45 PM

## 2024-01-04 NOTE — Plan of Care (Signed)
  Problem: Activity: Goal: Risk for activity intolerance will decrease Outcome: Progressing   Problem: Nutrition: Goal: Adequate nutrition will be maintained Outcome: Progressing   Problem: Elimination: Goal: Will not experience complications related to bowel motility Outcome: Progressing   Problem: Pain Managment: Goal: General experience of comfort will improve and/or be controlled Outcome: Progressing

## 2024-01-04 NOTE — Plan of Care (Signed)
  Problem: Education: Goal: Knowledge of General Education information will improve Description: Including pain rating scale, medication(s)/side effects and non-pharmacologic comfort measures Outcome: Progressing   Problem: Clinical Measurements: Goal: Ability to maintain clinical measurements within normal limits will improve Outcome: Progressing Goal: Will remain free from infection Outcome: Progressing Goal: Diagnostic test results will improve Outcome: Progressing Goal: Respiratory complications will improve Outcome: Progressing   Problem: Activity: Goal: Risk for activity intolerance will decrease Outcome: Progressing   Problem: Nutrition: Goal: Adequate nutrition will be maintained Outcome: Progressing   Problem: Coping: Goal: Level of anxiety will decrease Outcome: Progressing   Problem: Elimination: Goal: Will not experience complications related to bowel motility Outcome: Progressing Goal: Will not experience complications related to urinary retention Outcome: Progressing   Problem: Pain Managment: Goal: General experience of comfort will improve and/or be controlled Outcome: Progressing   Problem: Safety: Goal: Ability to remain free from injury will improve Outcome: Progressing   Problem: Skin Integrity: Goal: Risk for impaired skin integrity will decrease Outcome: Progressing   Problem: Coping: Goal: Ability to adjust to condition or change in health will improve Outcome: Progressing   Problem: Fluid Volume: Goal: Ability to maintain a balanced intake and output will improve Outcome: Progressing   Problem: Health Behavior/Discharge Planning: Goal: Ability to identify and utilize available resources and services will improve Outcome: Progressing Goal: Ability to manage health-related needs will improve Outcome: Progressing   Problem: Metabolic: Goal: Ability to maintain appropriate glucose levels will improve Outcome: Progressing   Problem:  Nutritional: Goal: Maintenance of adequate nutrition will improve Outcome: Progressing Goal: Progress toward achieving an optimal weight will improve Outcome: Progressing   Problem: Skin Integrity: Goal: Risk for impaired skin integrity will decrease Outcome: Progressing

## 2024-01-05 ENCOUNTER — Inpatient Hospital Stay (HOSPITAL_COMMUNITY)

## 2024-01-05 DIAGNOSIS — J9601 Acute respiratory failure with hypoxia: Secondary | ICD-10-CM | POA: Diagnosis not present

## 2024-01-05 LAB — CBC WITH DIFFERENTIAL/PLATELET
Abs Immature Granulocytes: 0.18 10*3/uL — ABNORMAL HIGH (ref 0.00–0.07)
Basophils Absolute: 0 10*3/uL (ref 0.0–0.1)
Basophils Relative: 0 %
Eosinophils Absolute: 0.2 10*3/uL (ref 0.0–0.5)
Eosinophils Relative: 3 %
HCT: 34.1 % — ABNORMAL LOW (ref 39.0–52.0)
Hemoglobin: 11.1 g/dL — ABNORMAL LOW (ref 13.0–17.0)
Immature Granulocytes: 2 %
Lymphocytes Relative: 17 %
Lymphs Abs: 1.2 10*3/uL (ref 0.7–4.0)
MCH: 28.6 pg (ref 26.0–34.0)
MCHC: 32.6 g/dL (ref 30.0–36.0)
MCV: 87.9 fL (ref 80.0–100.0)
Monocytes Absolute: 0.8 10*3/uL (ref 0.1–1.0)
Monocytes Relative: 11 %
Neutro Abs: 4.9 10*3/uL (ref 1.7–7.7)
Neutrophils Relative %: 67 %
Platelets: 213 10*3/uL (ref 150–400)
RBC: 3.88 MIL/uL — ABNORMAL LOW (ref 4.22–5.81)
RDW: 18 % — ABNORMAL HIGH (ref 11.5–15.5)
WBC: 7.4 10*3/uL (ref 4.0–10.5)
nRBC: 0 % (ref 0.0–0.2)

## 2024-01-05 LAB — GLUCOSE, CAPILLARY
Glucose-Capillary: 100 mg/dL — ABNORMAL HIGH (ref 70–99)
Glucose-Capillary: 109 mg/dL — ABNORMAL HIGH (ref 70–99)
Glucose-Capillary: 117 mg/dL — ABNORMAL HIGH (ref 70–99)
Glucose-Capillary: 118 mg/dL — ABNORMAL HIGH (ref 70–99)
Glucose-Capillary: 120 mg/dL — ABNORMAL HIGH (ref 70–99)
Glucose-Capillary: 138 mg/dL — ABNORMAL HIGH (ref 70–99)
Glucose-Capillary: 69 mg/dL — ABNORMAL LOW (ref 70–99)
Glucose-Capillary: 89 mg/dL (ref 70–99)

## 2024-01-05 LAB — BASIC METABOLIC PANEL
Anion gap: 5 (ref 5–15)
BUN: 31 mg/dL — ABNORMAL HIGH (ref 8–23)
CO2: 29 mmol/L (ref 22–32)
Calcium: 8.9 mg/dL (ref 8.9–10.3)
Chloride: 111 mmol/L (ref 98–111)
Creatinine, Ser: <0.3 mg/dL — ABNORMAL LOW (ref 0.61–1.24)
Glucose, Bld: 112 mg/dL — ABNORMAL HIGH (ref 70–99)
Potassium: 4.4 mmol/L (ref 3.5–5.1)
Sodium: 145 mmol/L (ref 135–145)

## 2024-01-05 NOTE — Progress Notes (Signed)
 TRH ROUNDING NOTE Andrew Rivera [id number]  DOB: 09-17-1951  DOA: 12/27/2023  PCP: Michail Sermon, MD  01/05/2024,9:25 AM  LOS: 8 days    Code Status: Full   From:  [facility]   Current Dispo: back to [facility]?   73 year old male with underlying quadriplegia chronic respiratory failure on vent with trach PEG Following Obesity Chronic low back pain with postop complications of infected hardware-admitted to [facility] with MSSA bacteremia previously  3/6 admit hypoxic shortness of breath getting more winded at facility CCM consulted Sodium 139 potassium 4.1 BUN/creatinine 20/8.3 alk phos 130 albumin 3.3 WBC 25 BNP 60 RSV flu COVID all negative UA showed nitrites large leukocytes many bacteria urine culture showed E. coli portable chest x-ray low lung volume moderate to severe bibasilar atelectasis infiltrate  Acute hypoxic respiratory failure in setting of pneumonia versus acute on chronic diastolic heart failure   Plan  Acute hypoxic respiratory failure pneumonia-sputum culture growing E. coli providentia  Pseudomonas  Continuing meropenem Q8-extend duration given worsening fever overnight 3/15--get 1 vw CXR today continues on Bactrim DS 1 tab every 12 Concern for aspiration with feeds D/w Dr. Luciana Axe scenario--likely mor epneumonitis--wouldn't change current coverage with abx.  Will follow clinically.  As no worsening oxygenation just follow If worsens, would stop feeds and discuss further care with family and pulmonary  ESBL UTI Meropenem should cover--would extend duration to 10days total  Hypernatremia during hospitalization Free water was increased to 400 every 4 Sodium was trended back down we can go to 200 every 4 and see the result  Acute metabolic encephalopathy on admission Likely related to infection as well as probable polypharmacy Continue cautiously Lexapro 5 daily, continue Lyrica 100 3 times daily-cautious use of fentanyl patch can use 1 mg Dilaudid every 8 for severe pain Can  resume as OP Atarax, Ativan  Hypertension Continue Norvasc 10, can use metoprolol injection 5 every 4 as needed pressure  Quadriplegia present trach PEG Reliant on ventilator is on but seems to be home settings Continue feeding supplements 50 cc/H with Osmolite and Prosource 60 mL daily as well as Juven packets 1 packet twice daily meals  Discussed with wife on phone and updated 3/15  DVT prophylaxis: Lovenox  Status is: Inpatient Remains inpatient appropriate because:   Requires further stool   Subjective:  Note low grade fevers overnight He doesn't seem to feel as well ROS severely limited 2/2 inability of patient to communicate We examined his back and sores and these areas seem to be stable  Objective + exam Vitals:   01/05/24 0600 01/05/24 0700 01/05/24 0844 01/05/24 0850  BP: (!) 126/49 (!) 127/48    Pulse: 82 86 96   Resp: 16 16 16    Temp:    100 F (37.8 C)  TempSrc:    Axillary  SpO2: 98% 98% 96%   Weight:      Height:       Filed Weights   12/27/23 1918 12/28/23 0400  Weight: 95.3 kg 90.1 kg    Examination:  Eomi ncat cannot rasie arms or legs Cta b no wheeze Chest seems clear He is warm to touch Abd soft-PEg in place Swollen in UE's   Data Reviewed: reviewed   CBC    Component Value Date/Time   WBC 7.4 01/05/2024 0347   RBC 3.88 (L) 01/05/2024 0347   HGB 11.1 (L) 01/05/2024 0347   HCT 34.1 (L) 01/05/2024 0347   PLT 213 01/05/2024 0347   MCV 87.9 01/05/2024 0347  MCH 28.6 01/05/2024 0347   MCHC 32.6 01/05/2024 0347   RDW 18.0 (H) 01/05/2024 0347   LYMPHSABS 1.2 01/05/2024 0347   MONOABS 0.8 01/05/2024 0347   EOSABS 0.2 01/05/2024 0347   BASOSABS 0.0 01/05/2024 0347      Latest Ref Rng & Units 01/05/2024    3:47 AM 01/04/2024    4:40 AM 01/03/2024    4:37 AM  CMP  Glucose 70 - 99 mg/dL 474  259  563   BUN 8 - 23 mg/dL 31  28  27    Creatinine 0.61 - 1.24 mg/dL <8.75  <6.43  <3.29   Sodium 135 - 145 mmol/L 145  144  145   Potassium  3.5 - 5.1 mmol/L 4.4  4.2  4.5   Chloride 98 - 111 mmol/L 111  108  106   CO2 22 - 32 mmol/L 29  29  31    Calcium 8.9 - 10.3 mg/dL 8.9  8.8  9.1     Scheduled Meds:  amLODipine  10 mg Per Tube Daily   enoxaparin (LOVENOX) injection  40 mg Subcutaneous Q24H   escitalopram  5 mg Per Tube Daily   feeding supplement (PROSource TF20)  60 mL Per Tube Daily   fentaNYL  1 patch Transdermal Q72H   free water  200 mL Per Tube Q4H   guaiFENesin  10 mL Per Tube TID   loratadine  10 mg Per Tube Daily   melatonin  10 mg Per Tube QHS   nutrition supplement (JUVEN)  1 packet Per Tube BID BM   mouth rinse  15 mL Mouth Rinse Q2H   pantoprazole (PROTONIX) IV  40 mg Intravenous QHS   pregabalin  100 mg Per Tube TID   sulfamethoxazole-trimethoprim  1 tablet Per Tube Q12H   Continuous Infusions:  feeding supplement (OSMOLITE 1.5 CAL) 50 mL/hr at 01/05/24 0700   meropenem (MERREM) IV Stopped (01/05/24 0601)    Time 37  Rhetta Mura, MD  [facility]

## 2024-01-06 DIAGNOSIS — J9601 Acute respiratory failure with hypoxia: Secondary | ICD-10-CM | POA: Diagnosis not present

## 2024-01-06 LAB — CBC WITH DIFFERENTIAL/PLATELET
Abs Immature Granulocytes: 0.1 10*3/uL — ABNORMAL HIGH (ref 0.00–0.07)
Basophils Absolute: 0 10*3/uL (ref 0.0–0.1)
Basophils Relative: 1 %
Eosinophils Absolute: 0.4 10*3/uL (ref 0.0–0.5)
Eosinophils Relative: 6 %
HCT: 33.4 % — ABNORMAL LOW (ref 39.0–52.0)
Hemoglobin: 10.9 g/dL — ABNORMAL LOW (ref 13.0–17.0)
Immature Granulocytes: 1 %
Lymphocytes Relative: 19 %
Lymphs Abs: 1.3 10*3/uL (ref 0.7–4.0)
MCH: 28.2 pg (ref 26.0–34.0)
MCHC: 32.6 g/dL (ref 30.0–36.0)
MCV: 86.5 fL (ref 80.0–100.0)
Monocytes Absolute: 0.9 10*3/uL (ref 0.1–1.0)
Monocytes Relative: 13 %
Neutro Abs: 4.1 10*3/uL (ref 1.7–7.7)
Neutrophils Relative %: 60 %
Platelets: 209 10*3/uL (ref 150–400)
RBC: 3.86 MIL/uL — ABNORMAL LOW (ref 4.22–5.81)
RDW: 18 % — ABNORMAL HIGH (ref 11.5–15.5)
WBC: 6.9 10*3/uL (ref 4.0–10.5)
nRBC: 0 % (ref 0.0–0.2)

## 2024-01-06 LAB — BASIC METABOLIC PANEL
Anion gap: 7 (ref 5–15)
BUN: 26 mg/dL — ABNORMAL HIGH (ref 8–23)
CO2: 30 mmol/L (ref 22–32)
Calcium: 8.9 mg/dL (ref 8.9–10.3)
Chloride: 109 mmol/L (ref 98–111)
Creatinine, Ser: <0.3 mg/dL — ABNORMAL LOW (ref 0.61–1.24)
Glucose, Bld: 111 mg/dL — ABNORMAL HIGH (ref 70–99)
Potassium: 4.2 mmol/L (ref 3.5–5.1)
Sodium: 146 mmol/L — ABNORMAL HIGH (ref 135–145)

## 2024-01-06 LAB — GLUCOSE, CAPILLARY
Glucose-Capillary: 115 mg/dL — ABNORMAL HIGH (ref 70–99)
Glucose-Capillary: 97 mg/dL (ref 70–99)
Glucose-Capillary: 114 mg/dL — ABNORMAL HIGH (ref 70–99)
Glucose-Capillary: 100 mg/dL — ABNORMAL HIGH (ref 70–99)
Glucose-Capillary: 102 mg/dL — ABNORMAL HIGH (ref 70–99)
Glucose-Capillary: 113 mg/dL — ABNORMAL HIGH (ref 70–99)

## 2024-01-06 MED ORDER — FUROSEMIDE 40 MG PO TABS
40.0000 mg | ORAL_TABLET | Freq: Every day | ORAL | Status: AC
  Administered 2024-01-06 – 2024-01-07 (×2): 40 mg
  Filled 2024-01-06 (×2): qty 1

## 2024-01-06 MED ORDER — SODIUM CHLORIDE 0.9 % IV SOLN
1.0000 g | Freq: Three times a day (TID) | INTRAVENOUS | Status: AC
  Administered 2024-01-06 – 2024-01-08 (×6): 1 g via INTRAVENOUS
  Filled 2024-01-06 (×6): qty 20

## 2024-01-06 NOTE — Plan of Care (Signed)
  Problem: Education: Goal: Knowledge of General Education information will improve Description: Including pain rating scale, medication(s)/side effects and non-pharmacologic comfort measures Outcome: Progressing   Problem: Clinical Measurements: Goal: Ability to maintain clinical measurements within normal limits will improve Outcome: Progressing Goal: Will remain free from infection Outcome: Progressing Goal: Diagnostic test results will improve Outcome: Progressing Goal: Respiratory complications will improve Outcome: Progressing   Problem: Activity: Goal: Risk for activity intolerance will decrease Outcome: Progressing   Problem: Nutrition: Goal: Adequate nutrition will be maintained Outcome: Progressing   Problem: Coping: Goal: Level of anxiety will decrease Outcome: Progressing   Problem: Elimination: Goal: Will not experience complications related to bowel motility Outcome: Progressing Goal: Will not experience complications related to urinary retention Outcome: Progressing   Problem: Pain Managment: Goal: General experience of comfort will improve and/or be controlled Outcome: Progressing   Problem: Safety: Goal: Ability to remain free from injury will improve Outcome: Progressing   Problem: Skin Integrity: Goal: Risk for impaired skin integrity will decrease Outcome: Progressing   Problem: Coping: Goal: Ability to adjust to condition or change in health will improve Outcome: Progressing   Problem: Fluid Volume: Goal: Ability to maintain a balanced intake and output will improve Outcome: Progressing   Problem: Health Behavior/Discharge Planning: Goal: Ability to identify and utilize available resources and services will improve Outcome: Progressing Goal: Ability to manage health-related needs will improve Outcome: Progressing   Problem: Metabolic: Goal: Ability to maintain appropriate glucose levels will improve Outcome: Progressing   Problem:  Nutritional: Goal: Maintenance of adequate nutrition will improve Outcome: Progressing Goal: Progress toward achieving an optimal weight will improve Outcome: Progressing   Problem: Skin Integrity: Goal: Risk for impaired skin integrity will decrease Outcome: Progressing

## 2024-01-06 NOTE — Progress Notes (Signed)
 TRH ROUNDING NOTE Andrew Rivera [id number]  DOB: [birth year]  DOA: 12/27/2023  PCP: Michail Sermon, MD  01/06/2024,2:38 PM  LOS: 9 days    Code Status: Full   From:  [facility]   Current Dispo: back to [facility]?   73 year old male with underlying quadriplegia chronic respiratory failure on vent with trach PEG Following Obesity Chronic low back pain with postop complications of infected hardware-admitted to [facility] with MSSA bacteremia previously  3/6 admit hypoxic shortness of breath getting more winded at facility CCM consulted Sodium 139 potassium 4.1 BUN/creatinine 20/8.3 alk phos 130 albumin 3.3 WBC 25 BNP 60 RSV flu COVID all negative UA showed nitrites large leukocytes many bacteria urine culture showed E. coli portable chest x-ray low lung volume moderate to severe bibasilar atelectasis infiltrate  Acute hypoxic respiratory failure in setting of pneumonia versus acute on chronic diastolic heart failure 3/15 mild fever--no wbc--cxr ? fluid  Plan  Acute hypoxic respiratory failure pneumonia-sputum culture growing E. coli providentia  Pseudomonas  Continuing meropenem Q8-extend duration given worsening fever overnight 3/15--seems to have been pneumonitis D/w Dr. Luciana Axe scenario--suspect pneumonitis--wouldn't change current coverage with abx.  Will follow clinically.  As no worsening oxygenation just follow for now. CXR suggest maybe fluid.  Will start lasix 40 x 2 doses and see how he does  ESBL UTI Meropenem should cover--would extend duration to 10days total ending 3/18  Hypernatremia during hospitalization Free water was increased to 400 every 4 Sodium improved--periodic re-checks  Acute metabolic encephalopathy on admission Likely related to infection as well as probable polypharmacy Continue cautiously Lexapro 5 daily, continue Lyrica 100 3 times daily-cautious use of fentanyl patch can use 1 mg Dilaudid every 8 for severe pain Can resume as OP Atarax, Ativan  Hypertension Continue  Norvasc 10,  metoprolol injection 5 every 4 as needed pressure  Quadriplegia present trach PEG Reliant on ventilator is on but seems to be home settings Continue feeding supplements 50 cc/H with Osmolite and Prosource 60 mL daily as well as Juven packets 1 packet twice daily meals  Decubitus ulcers  Discussed with wife on phone and updated 3/16--understands the plan  DVT prophylaxis: Lovenox  Status is: Inpatient Remains inpatient appropriate because:   Requires further stool   Subjective:  Looks well No recurrent fever Seems in good spirits   Objective + exam Vitals:   01/06/24 1200 01/06/24 1300 01/06/24 1400 01/06/24 1412  BP: 138/60 (!) 120/53 (!) 145/54   Pulse: 76 68 73 73  Resp: 14 16 16 16   Temp:      TempSrc:      SpO2: 100% 98% 100% 100%  Weight:      Height:       Filed Weights   12/27/23 1918 12/28/23 0400  Weight: 95.3 kg 90.1 kg    Examination:  Eomi ncat  Cta b no wheeze Chest seems clear anteriorly Abd soft-PEg in place Swollen in UE's  Wound son back etc examined--nothing looks grossly infected  Data Reviewed: reviewed   CBC    Component Value Date/Time   WBC 6.9 01/06/2024 0401   RBC 3.86 (L) 01/06/2024 0401   HGB 10.9 (L) 01/06/2024 0401   HCT 33.4 (L) 01/06/2024 0401   PLT 209 01/06/2024 0401   MCV 86.5 01/06/2024 0401   MCH 28.2 01/06/2024 0401   MCHC 32.6 01/06/2024 0401   RDW 18.0 (H) 01/06/2024 0401   LYMPHSABS 1.3 01/06/2024 0401   MONOABS 0.9 01/06/2024 0401   EOSABS 0.4 01/06/2024 0401  BASOSABS 0.0 01/06/2024 0401      Latest Ref Rng & Units 01/06/2024    4:01 AM 01/05/2024    3:47 AM 01/04/2024    4:40 AM  CMP  Glucose 70 - 99 mg/dL 161  096  045   BUN 8 - 23 mg/dL 26  31  28    Creatinine 0.61 - 1.24 mg/dL <4.09  <8.11  <9.14   Sodium 135 - 145 mmol/L 146  145  144   Potassium 3.5 - 5.1 mmol/L 4.2  4.4  4.2   Chloride 98 - 111 mmol/L 109  111  108   CO2 22 - 32 mmol/L 30  29  29    Calcium 8.9 - 10.3 mg/dL 8.9   8.9  8.8     Scheduled Meds:  amLODipine  10 mg Per Tube Daily   enoxaparin (LOVENOX) injection  40 mg Subcutaneous Q24H   escitalopram  5 mg Per Tube Daily   feeding supplement (PROSource TF20)  60 mL Per Tube Daily   fentaNYL  1 patch Transdermal Q72H   free water  200 mL Per Tube Q4H   guaiFENesin  10 mL Per Tube TID   loratadine  10 mg Per Tube Daily   melatonin  10 mg Per Tube QHS   nutrition supplement (JUVEN)  1 packet Per Tube BID BM   pantoprazole (PROTONIX) IV  40 mg Intravenous QHS   pregabalin  100 mg Per Tube TID   sulfamethoxazole-trimethoprim  1 tablet Per Tube Q12H   Continuous Infusions:  feeding supplement (OSMOLITE 1.5 CAL) 50 mL/hr at 01/06/24 1400   Time 27  Rhetta Mura, MD  [facility]

## 2024-01-07 DIAGNOSIS — Y95 Nosocomial condition: Secondary | ICD-10-CM

## 2024-01-07 DIAGNOSIS — Z930 Tracheostomy status: Secondary | ICD-10-CM

## 2024-01-07 DIAGNOSIS — J9601 Acute respiratory failure with hypoxia: Secondary | ICD-10-CM | POA: Diagnosis not present

## 2024-01-07 LAB — GLUCOSE, CAPILLARY
Glucose-Capillary: 101 mg/dL — ABNORMAL HIGH (ref 70–99)
Glucose-Capillary: 119 mg/dL — ABNORMAL HIGH (ref 70–99)
Glucose-Capillary: 135 mg/dL — ABNORMAL HIGH (ref 70–99)
Glucose-Capillary: 138 mg/dL — ABNORMAL HIGH (ref 70–99)
Glucose-Capillary: 150 mg/dL — ABNORMAL HIGH (ref 70–99)
Glucose-Capillary: 179 mg/dL — ABNORMAL HIGH (ref 70–99)

## 2024-01-07 LAB — CBC WITH DIFFERENTIAL/PLATELET
Abs Immature Granulocytes: 0.05 10*3/uL (ref 0.00–0.07)
Basophils Absolute: 0 10*3/uL (ref 0.0–0.1)
Basophils Relative: 0 %
Eosinophils Absolute: 0.4 10*3/uL (ref 0.0–0.5)
Eosinophils Relative: 5 %
HCT: 34.6 % — ABNORMAL LOW (ref 39.0–52.0)
Hemoglobin: 11.2 g/dL — ABNORMAL LOW (ref 13.0–17.0)
Immature Granulocytes: 1 %
Lymphocytes Relative: 19 %
Lymphs Abs: 1.5 10*3/uL (ref 0.7–4.0)
MCH: 27.8 pg (ref 26.0–34.0)
MCHC: 32.4 g/dL (ref 30.0–36.0)
MCV: 85.9 fL (ref 80.0–100.0)
Monocytes Absolute: 0.9 10*3/uL (ref 0.1–1.0)
Monocytes Relative: 11 %
Neutro Abs: 5 10*3/uL (ref 1.7–7.7)
Neutrophils Relative %: 64 %
Platelets: 207 10*3/uL (ref 150–400)
RBC: 4.03 MIL/uL — ABNORMAL LOW (ref 4.22–5.81)
RDW: 18 % — ABNORMAL HIGH (ref 11.5–15.5)
WBC: 7.8 10*3/uL (ref 4.0–10.5)
nRBC: 0 % (ref 0.0–0.2)

## 2024-01-07 LAB — BASIC METABOLIC PANEL
Anion gap: 8 (ref 5–15)
BUN: 21 mg/dL (ref 8–23)
CO2: 24 mmol/L (ref 22–32)
Calcium: 8.5 mg/dL — ABNORMAL LOW (ref 8.9–10.3)
Chloride: 109 mmol/L (ref 98–111)
Creatinine, Ser: <0.3 mg/dL — ABNORMAL LOW (ref 0.61–1.24)
Glucose, Bld: 122 mg/dL — ABNORMAL HIGH (ref 70–99)
Potassium: 4 mmol/L (ref 3.5–5.1)
Sodium: 141 mmol/L (ref 135–145)

## 2024-01-07 NOTE — TOC Progression Note (Signed)
 Transition of Care [facility]) - Progression Note    Patient Details  Name: Andrew Rivera MRN: [id number] Date of Birth: 1951-09-23  Transition of Care [facility]) CM/SW Contact  Tom-Johnson, Hershal Coria, RN Phone Number: 01/07/2024, 10:51 AM  Clinical Narrative:     CM notified by Elonda Husky, Administrator at [facility] that a bed will be available tomorrow and patient can be discharged back to [facility]. MD and wife notified.   CM will continue to follow.        Expected Discharge Plan and Services         Expected Discharge Date: 01/03/24                                     Social Determinants of Health (SDOH) Interventions SDOH Screenings   Food Insecurity: Patient Unable To Answer (12/29/2023)  Housing: Patient Unable To Answer (12/29/2023)  Transportation Needs: Patient Unable To Answer (12/29/2023)  Utilities: Patient Unable To Answer (12/29/2023)  Social Connections: Patient Unable To Answer (12/29/2023)  Tobacco Use: Medium Risk (12/28/2023)    Readmission Risk Interventions    09/10/2023    3:49 PM  Readmission Risk Prevention Plan  Transportation Screening Complete  PCP or Specialist Appt within 5-7 Days Complete  Home Care Screening Complete  Medication Review (RN CM) Complete

## 2024-01-07 NOTE — TOC Progression Note (Signed)
 Transition of Care [facility]) - Progression Note    Patient Details  Name: Andrew Rivera MRN: [id number] Date of Birth: 07-Dec-1950  Transition of Care [facility]) CM/SW Contact  Erin Sons, [place] Phone Number: 01/07/2024, 9:55 AM  Clinical Narrative:     CSW requested update from [facility] regarding bed availability; awaiting response.        Expected Discharge Plan and Services         Expected Discharge Date: 01/03/24                                     Social Determinants of Health (SDOH) Interventions SDOH Screenings   Food Insecurity: Patient Unable To Answer (12/29/2023)  Housing: Patient Unable To Answer (12/29/2023)  Transportation Needs: Patient Unable To Answer (12/29/2023)  Utilities: Patient Unable To Answer (12/29/2023)  Social Connections: Patient Unable To Answer (12/29/2023)  Tobacco Use: Medium Risk (12/28/2023)    Readmission Risk Interventions    09/10/2023    3:49 PM  Readmission Risk Prevention Plan  Transportation Screening Complete  PCP or Specialist Appt within 5-7 Days Complete  Home Care Screening Complete  Medication Review (RN CM) Complete

## 2024-01-07 NOTE — Progress Notes (Signed)
   NAME:  Andrew Rivera, MRN:  [id number], DOB:  13-Dec-1950, LOS: 10 ADMISSION DATE:  12/27/2023, CONSULTATION DATE:  12/28/23 REFERRING MD:  Gerhard Munch, MD, CHIEF COMPLAINT:  AMS and hypoxia from Vent facility  History of Present Illness:  73 y/o male chronic trach?peg on vent who was sent from facility secondary to AMS low sats.  Although EMS reports sats 96-99% and per ED note mentation baseline (he was mouthing some words in ED).  In the ED his cxr suggested pneumonia and small b/l pleural effusions.  When he was being turned he desated and was put on 100% FIO2.  In the ED he received 80 IVP Lasix and Zosyn and vancomycin.  Blood and sputum cx's pending.  Pertinent  Medical History  Chronic  Significant Hospital Events: Including procedures, antibiotic start and stop dates in addition to other pertinent events   3/7: transferred from Ventilator Facility for AMS and Hypoxia, chronic trach and Peg  Interim History / Subjective:  Asking to be suctioned. Some thick secretions, plugs noted.   Objective   Blood pressure (!) 154/55, pulse 75, temperature 98.2 F (36.8 C), temperature source Oral, resp. rate (!) 23, height 5\' 9"  (1.753 m), weight 90.1 kg, SpO2 100%.    Vent Mode: PRVC FiO2 (%):  [40 %] 40 % Set Rate:  [16 bmp] 16 bmp Vt Set:  [550 mL] 550 mL PEEP:  [5 cmH20] 5 cmH20 Plateau Pressure:  [17 cmH20-21 cmH20] 17 cmH20   Intake/Output Summary (Last 24 hours) at 01/07/2024 1134 Last data filed at 01/07/2024 0900 Gross per 24 hour  Intake 2720.06 ml  Output 2450 ml  Net 270.06 ml   Filed Weights   12/27/23 1918 12/28/23 0400  Weight: 95.3 kg 90.1 kg    Examination:  General: elderly appearing male on vent via trach HEENT normocephalic atraumatic size 6 XLT tracheostomy is midline stoma unremarkable Pulmonary: Clear bilateral breath sounds Cardiac RRR, no MRG Abdomen Soft, NT, ND Neuro Awake, mouths words.  Extremities: no acute deformity  Resolved Hospital Problem  list   N/A  Assessment & Plan:  Acute on chronic respiratory failure Vent dependence  Trach dependence  Pneumonia-MDR ECOLI VAP  Pleural effusions Baseline quadriplegia HFpEF UTI ECOLI ESBL producer  H/o Depression AMS-seems to be at baseline per chart recordings Severe hypernatremia Hyperchloremia Hyperglycemia Azotemia  Pulm prob list  Acute on chronic respiratory failure 2/2 MDR Ecoli VAP, Providencia rettgeri CRE/MDM complicated by volume overload with pulmonary edema and pleural effusions in a patient with chronic trach and ventilator dependence. Does have some ongoing secretions.  Plan Requiring minimal vent support Routine tracheostomy care Agree with diuresis Meropenem, Bactrim per ID recs VAP bundle Thick secretions, will add chest PT.  Reportedly back to [facility] tomorrow. Appropriate.     Best Practice (right click and "Reselect all SmartList Selections" daily)   Per primary team        Joneen Roach, AGACNP-BC [facility]  See Amion for personal pager PCCM on call pager [phone] until 7pm. Please call Elink 7p-7a. [phone]  01/07/2024 12:29 PM

## 2024-01-07 NOTE — Progress Notes (Signed)
 TRH ROUNDING NOTE KNOX CERVI [id number]:[id number]  DOB: 21-Dec-1950  DOA: 12/27/2023  PCP: Michail Sermon, MD  01/07/2024,1:58 PM  LOS: 10 days    Code Status: Full   From:  [facility]   Current Dispo: back to [facility]?   73 year old male with underlying quadriplegia chronic respiratory failure on vent with trach PEG Following Obesity Chronic low back pain with postop complications of infected hardware-admitted to [facility] with MSSA bacteremia previously  3/6 admit hypoxic shortness of breath getting more winded at facility CCM consulted Sodium 139 potassium 4.1 BUN/creatinine 20/8.3 alk phos 130 albumin 3.3 WBC 25 BNP 60 RSV flu COVID all negative UA showed nitrites large leukocytes many bacteria urine culture showed E. coli portable chest x-ray low lung volume moderate to severe bibasilar atelectasis infiltrate  Acute hypoxic respiratory failure in setting of pneumonia versus acute on chronic diastolic heart failure 3/15 mild fever--no wbc--cxr ? fluid  Plan  Acute hypoxic respiratory failure pneumonia-sputum culture growing E. coli providentia  Pseudomonas  Continuing meropenem Q8-extend duration given worsening fever overnight 3/15--seems to have been pneumonitis Providencia being covered with bactrim D/w Dr. Luciana Axe scenario--suspect pneumonitis--wouldn't change current coverage with abx.  Will follow clinically.  As no worsening oxygenation just follow for now. CXR suggest maybe fluid--lasix given and will reassess cxr in am  ESBL UTI Meropenem should cover--would extend duration to 10 days total ending 3/18  Hypernatremia during hospitalization Free water was increased to 400 every 4, now back to 200 Sodium improved--periodic re-checks  Acute metabolic encephalopathy on admission Likely related to infection as well as probable polypharmacy on Lexapro 5 daily, continue Lyrica 100 3 times daily- For pain fentanyl patch can use 1 mg Dilaudid po q 8 for severe pain Can resume as OP Atarax,  Ativan  Hypertension Continue Norvasc 10,  metoprolol injection 5 every 4 as needed pressure  Quadriplegia present trach PEG Reliant on ventilator is on but seems to be home settings Continue feeding supplements 50 cc/H with Osmolite and Prosource 60 mL daily as well as Juven packets 1 packet twice daily meals  Decubitus ulcers  Discussed with wife on phone and updated 3/16--understands the plan  DVT prophylaxis: Lovenox  Status is: Inpatient Remains inpatient appropriate because:   Requires further stool   Subjective:  Doesn't feel as well as yesterday thinks feels worse in chest--h/o limited as patient unable to communicate orally   Objective + exam Vitals:   01/07/24 1100 01/07/24 1118 01/07/24 1200 01/07/24 1300  BP: (!) 151/60  (!) 154/65 (!) 161/62  Pulse: 91  95 95  Resp: 17  16 16   Temp:  98.2 F (36.8 C)    TempSrc:  Oral    SpO2: 97%  96% 96%  Weight:      Height:       Filed Weights   12/27/23 1918 12/28/23 0400  Weight: 95.3 kg 90.1 kg    Examination:  Eomi ncat --on vent with usual settings Cta b no wheeze Chest seems clear anteriorly ABd soft peg well placed Swollen in UE's  Back not examined today  Data Reviewed: reviewed   CBC    Component Value Date/Time   WBC 7.8 01/07/2024 0337   RBC 4.03 (L) 01/07/2024 0337   HGB 11.2 (L) 01/07/2024 0337   HCT 34.6 (L) 01/07/2024 0337   PLT 207 01/07/2024 0337   MCV 85.9 01/07/2024 0337   MCH 27.8 01/07/2024 0337   MCHC 32.4 01/07/2024 0337   RDW 18.0 (H) 01/07/2024 6440  LYMPHSABS 1.5 01/07/2024 0337   MONOABS 0.9 01/07/2024 0337   EOSABS 0.4 01/07/2024 0337   BASOSABS 0.0 01/07/2024 0337      Latest Ref Rng & Units 01/07/2024    3:37 AM 01/06/2024    4:01 AM 01/05/2024    3:47 AM  CMP  Glucose 70 - 99 mg/dL 235  573  220   BUN 8 - 23 mg/dL 21  26  31    Creatinine 0.61 - 1.24 mg/dL <2.54  <2.70  <6.23   Sodium 135 - 145 mmol/L 141  146  145   Potassium 3.5 - 5.1 mmol/L 4.0  4.2  4.4    Chloride 98 - 111 mmol/L 109  109  111   CO2 22 - 32 mmol/L 24  30  29    Calcium 8.9 - 10.3 mg/dL 8.5  8.9  8.9     Scheduled Meds:  amLODipine  10 mg Per Tube Daily   enoxaparin (LOVENOX) injection  40 mg Subcutaneous Q24H   escitalopram  5 mg Per Tube Daily   feeding supplement (PROSource TF20)  60 mL Per Tube Daily   fentaNYL  1 patch Transdermal Q72H   free water  200 mL Per Tube Q4H   guaiFENesin  10 mL Per Tube TID   loratadine  10 mg Per Tube Daily   melatonin  10 mg Per Tube QHS   nutrition supplement (JUVEN)  1 packet Per Tube BID BM   pantoprazole (PROTONIX) IV  40 mg Intravenous QHS   pregabalin  100 mg Per Tube TID   sulfamethoxazole-trimethoprim  1 tablet Per Tube Q12H   Continuous Infusions:  feeding supplement (OSMOLITE 1.5 CAL) 50 mL/hr at 01/07/24 0900   meropenem (MERREM) IV Stopped (01/07/24 0549)   Time 27  Rhetta Mura, MD  [facility]

## 2024-01-08 ENCOUNTER — Other Ambulatory Visit (HOSPITAL_COMMUNITY): Payer: Self-pay

## 2024-01-08 ENCOUNTER — Inpatient Hospital Stay (HOSPITAL_COMMUNITY)

## 2024-01-08 DIAGNOSIS — J9601 Acute respiratory failure with hypoxia: Secondary | ICD-10-CM | POA: Diagnosis not present

## 2024-01-08 LAB — CBC WITH DIFFERENTIAL/PLATELET
Abs Immature Granulocytes: 0.06 10*3/uL (ref 0.00–0.07)
Basophils Absolute: 0 10*3/uL (ref 0.0–0.1)
Basophils Relative: 0 %
Eosinophils Absolute: 0.2 10*3/uL (ref 0.0–0.5)
Eosinophils Relative: 2 %
HCT: 35.7 % — ABNORMAL LOW (ref 39.0–52.0)
Hemoglobin: 11.5 g/dL — ABNORMAL LOW (ref 13.0–17.0)
Immature Granulocytes: 1 %
Lymphocytes Relative: 8 %
Lymphs Abs: 0.8 10*3/uL (ref 0.7–4.0)
MCH: 28.1 pg (ref 26.0–34.0)
MCHC: 32.2 g/dL (ref 30.0–36.0)
MCV: 87.3 fL (ref 80.0–100.0)
Monocytes Absolute: 0.8 10*3/uL (ref 0.1–1.0)
Monocytes Relative: 8 %
Neutro Abs: 8.1 10*3/uL — ABNORMAL HIGH (ref 1.7–7.7)
Neutrophils Relative %: 81 %
Platelets: UNDETERMINED 10*3/uL (ref 150–400)
RBC: 4.09 MIL/uL — ABNORMAL LOW (ref 4.22–5.81)
RDW: 18.1 % — ABNORMAL HIGH (ref 11.5–15.5)
WBC: 10 10*3/uL (ref 4.0–10.5)
nRBC: 0 % (ref 0.0–0.2)

## 2024-01-08 LAB — GLUCOSE, CAPILLARY
Glucose-Capillary: 114 mg/dL — ABNORMAL HIGH (ref 70–99)
Glucose-Capillary: 109 mg/dL — ABNORMAL HIGH (ref 70–99)

## 2024-01-08 LAB — BASIC METABOLIC PANEL
Anion gap: 6 (ref 5–15)
BUN: 40 mg/dL — ABNORMAL HIGH (ref 8–23)
CO2: 25 mmol/L (ref 22–32)
Calcium: 8.7 mg/dL — ABNORMAL LOW (ref 8.9–10.3)
Chloride: 112 mmol/L — ABNORMAL HIGH (ref 98–111)
Creatinine, Ser: <0.3 mg/dL — ABNORMAL LOW (ref 0.61–1.24)
Glucose, Bld: 128 mg/dL — ABNORMAL HIGH (ref 70–99)
Potassium: 4.5 mmol/L (ref 3.5–5.1)
Sodium: 143 mmol/L (ref 135–145)

## 2024-01-08 MED ORDER — PREGABALIN 100 MG PO CAPS: 100.0000 mg | ORAL_CAPSULE | Freq: Three times a day (TID) | ORAL | 0 refills | Status: AC

## 2024-01-08 MED ORDER — HYDROMORPHONE HCL 2 MG PO TABS: 1.0000 mg | ORAL_TABLET | Freq: Three times a day (TID) | ORAL | 0 refills | Status: AC | PRN

## 2024-01-08 MED ORDER — FREE WATER
300.0000 mL | Status: DC
  Administered 2024-01-08: 300 mL

## 2024-01-08 MED ORDER — ESCITALOPRAM OXALATE 5 MG PO TABS: 5.0000 mg | ORAL_TABLET | Freq: Every day | ORAL | 0 refills | Status: DC

## 2024-01-08 MED ORDER — HYDROXYZINE HCL 25 MG PO TABS: 25.0000 mg | ORAL_TABLET | Freq: Three times a day (TID) | ORAL | 0 refills | Status: DC

## 2024-01-08 MED ORDER — FREE WATER: 300.0000 mL | Status: AC

## 2024-01-08 MED ORDER — FENTANYL 25 MCG/HR TD PT72: 1.0000 | MEDICATED_PATCH | TRANSDERMAL | 0 refills | Status: AC

## 2024-01-08 MED ORDER — LORAZEPAM 0.5 MG PO TABS: 0.5000 mg | ORAL_TABLET | Freq: Two times a day (BID) | ORAL | 0 refills | Status: AC | PRN

## 2024-01-08 NOTE — Discharge Summary (Signed)
 Physician Discharge Summary  Andrew Rivera NWG:[id number] DOB: 05-17-1951 DOA: 12/27/2023  PCP: Michail Sermon, MD  Admit date: 12/27/2023 Discharge date: 01/08/2024  Time spent: 36 minutes  Recommendations for Outpatient Follow-up:  Return to [facility] for further management  Discharge Diagnoses:  MAIN problem for hospitalization   ESBL organism with sepsi on admit as well as Providencia and Pseudomonas infeciton  Please see below for itemized issues addressed in Hopsital- refer to other progress notes for clarity if needed  Discharge Condition: good  Diet recommendation: tube feeds as per prior  Filed Weights   12/27/23 1918 12/28/23 0400  Weight: 95.3 kg 90.1 kg    History of present illness:  73 year old male with underlying quadriplegia secondary to underlying chronic back surgeries and eventual debility Chronic respiratory failure on vent with trach PEG-usually on FiO2 40 respiratory rate 16 PEEP of 5 Obesity Chronic low back pain with postop complications of infected hardware-admitted to [facility] with MSSA bacteremia previously   3/6 admit hypoxic shortness of breath getting more winded at facility CCM consulted Sodium 139 potassium 4.1 BUN/creatinine 20/0.3 alk phos 130 albumin 3.3 WBC 25 BNP 60 RSV flu COVID all negative UA showed nitrites large leukocytes many bacteria urine culture showed E. coli  portable chest x-ray low lung volume moderate to severe bibasilar atelectasis infiltrate   Acute hypoxic respiratory failure in setting of pneumonia versus acute on chronic diastolic heart failure Eventually found to have bacteremia with ESBL Trach aspirate grew E. coli, [place] Rettgeri, Pseudomonas aeruginosa additionally ID was consulted   Plan   Acute hypoxic respiratory failure pneumonia-sputum culture growing E. coli providentia  Pseudomonas  Antibiotic for narrowed by ID from cefepime and doxycycline to meropenem as well as Bactrim completed meropenem on 3/17 for the  ESBL organism  ID recommended completion of Bactrim for 7 days to complete therapy with ending on 3/17 Patient had intermittent temperatures this hospital stay while receiving abx and was thought to have a pneumonitis-this was discussed with ID -no WBC elevations was noted--patient was felt to be relatively moderate risk for recurrence of fever with chronic PEG and aspiration risk  Sepsis secondary to polymicrobial trach aspirate showing multiple organisms ESBL UTI Meropenem and bactrim were finished during hospital stay   Hypernatremia during hospitalization-trended up to 156 Free water was increased to 300 every 4 earlier as patient developed mild hypernatremia--recommend follow up labs at [facility] in 2-3 d Lasix held this admit--might need to resume   Acute metabolic encephalopathy on admission Likely related to infection as well as probable polypharmacy Continue cautiously Lexapro 5 daily, continue Lyrica 100 3 times daily-cautious use of fentanyl patch can use 1 mg Dilaudid every 8 for severe pain p.o. stopped Atarax, Ativan earlier in hospital stay but have resumed at discharge   Hypertension Continue Norvasc 10, can use metoprolol injection 5 every 4 as needed pressure if felt necessary at facility   Quadriplegia present trach PEG Reliant on ventilator is on but seems to be home settings--the trach seems to be in place but there is a large stoma with a large air leak and they have to inflate the cuff Recommend outpatient follow-up Continue feeding supplements 50 cc/H with Osmolite and Prosource 60 mL daily as well as Juven packets 1 packet twice daily meals      Discharge Exam: Vitals:   01/08/24 0700 01/08/24 0759  BP: (!) 139/50   Pulse: 73   Resp: 16   Temp:  98 F (36.7 C)  SpO2: 100%  Subj on day of d/c   Awake coherent mouths words, doesn't seem to be in distress Nursing reports no issues  General Exam on discharge  Motions with eyes, trach in place--Vent  on settings as below Chest clear anteriorly  S1 s2 no m Abd soft, PEG in place Trace edema Upper and lower ext Vent Mode: PRVC FiO2 (%):  [40 %] 40 % Set Rate:  [16 bmp] 16 bmp Vt Set:  [550 mL] 550 mL PEEP:  [5 cmH20] 5 cmH20 Plateau Pressure:  [17 cmH20-22 cmH20] 17 cmH20   Discharge Instructions   Discharge Instructions     Increase activity slowly   Complete by: As directed    Increase activity slowly   Complete by: As directed    No wound care   Complete by: As directed       Allergies as of 01/08/2024       Reactions   Chlorhexidine Itching, Rash        Medication List     STOP taking these medications    furosemide 20 MG tablet Commonly known as: LASIX   potassium chloride 20 MEQ/15ML (10%) Soln       TAKE these medications    acetaminophen 325 MG tablet Commonly known as: TYLENOL Place 975 mg into feeding tube in the morning and at bedtime.   amLODipine 10 MG tablet Commonly known as: NORVASC Place 1 tablet (10 mg total) into feeding tube daily.   Carboxymethylcellulose Sodium 1 % Gel Place 1 drop into both eyes in the morning, at noon, and at bedtime.   escitalopram 5 MG tablet Commonly known as: LEXAPRO Place 1 tablet (5 mg total) into feeding tube daily.   feeding supplement (OSMOLITE 1.5 CAL) Liqd Place 50 mL/hr into feeding tube continuous.   feeding supplement (PROSource TF20) liquid Place 60 mLs into feeding tube daily.   fentaNYL 25 MCG/HR Commonly known as: DURAGESIC Place 1 patch onto the skin every 3 (three) days.   free water Soln Place 300 mLs into feeding tube every 4 (four) hours. What changed: how much to take   guaiFENesin 100 MG/5ML liquid Commonly known as: ROBITUSSIN Place 10 mLs into feeding tube 3 (three) times daily.   HYDROmorphone 2 MG tablet Commonly known as: DILAUDID Place 0.5 tablets (1 mg total) into feeding tube every 8 (eight) hours as needed for severe pain (pain score 7-10).   hydrOXYzine 25  MG tablet Commonly known as: ATARAX Place 1 tablet (25 mg total) into feeding tube 3 (three) times daily.   ipratropium-albuterol 0.5-2.5 (3) MG/3ML Soln Commonly known as: DUONEB Take 3 mLs by nebulization every 4 (four) hours as needed (respiratory failure).   levocetirizine 5 MG tablet Commonly known as: XYZAL Place 5 mg into feeding tube every evening.   LORazepam 0.5 MG tablet Commonly known as: ATIVAN Place 1 tablet (0.5 mg total) into feeding tube every 12 (twelve) hours as needed for anxiety.   Melatonin 10 MG Tabs Give 10 mg by tube at bedtime.   ondansetron 4 MG disintegrating tablet Commonly known as: ZOFRAN-ODT Take 4 mg by mouth every 8 (eight) hours as needed for nausea or vomiting.   pantoprazole sodium 40 mg Commonly known as: PROTONIX Place 40 mg into feeding tube daily.   polyethylene glycol 17 g packet Commonly known as: MIRALAX / GLYCOLAX Place 17 g into feeding tube 2 (two) times daily. What changed: when to take this   pregabalin 100 MG capsule Commonly known as: LYRICA Place  1 capsule (100 mg total) into feeding tube 3 (three) times daily.   scopolamine 1 MG/3DAYS Commonly known as: TRANSDERM-SCOP Place 1 patch onto the skin every 3 (three) days.       Allergies  Allergen Reactions   Chlorhexidine Itching and Rash      The results of significant diagnostics from this hospitalization (including imaging, microbiology, ancillary and laboratory) are listed below for reference.    Significant Diagnostic Studies: DG CHEST PORT 1 VIEW Result Date: 01/05/2024 CLINICAL DATA:  [id number] PNA (pneumonia) [id number] EXAM: PORTABLE CHEST 1 VIEW COMPARISON:  12/27/2023 FINDINGS: Interval placement of tracheostomy tube which appears appropriately positioned. Stable heart size. Low lung volumes. Bibasilar atelectasis. Diffuse bilateral interstitial opacities. No pleural effusion or pneumothorax. IMPRESSION: 1. Interval placement of tracheostomy tube which appears  appropriately positioned. 2. Diffuse bilateral interstitial opacities, which may reflect edema or atypical infection. Electronically Signed   By: Duanne Guess D.O.   On: 01/05/2024 13:54   DG Chest Portable 1 View Result Date: 12/27/2023 CLINICAL DATA:  Shortness of breath EXAM: PORTABLE CHEST 1 VIEW COMPARISON:  September 11, 2023 FINDINGS: There is stable tracheostomy tube positioning. The cardiac silhouette is mildly enlarged and unchanged in size. Low lung volumes are noted with mild, diffusely increased lung markings. Mild to moderate severity bibasilar atelectasis and/or infiltrate is seen. Small bilateral pleural effusions are noted. No pneumothorax is identified. Postoperative changes seen throughout the cervical spine and upper thoracic spine. IMPRESSION: 1. Low lung volumes with mild to moderate severity bibasilar atelectasis and/or infiltrate. 2. Small bilateral pleural effusions. Electronically Signed   By: Aram Candela M.D.   On: 12/27/2023 22:45    Microbiology: No results found for this or any previous visit (from the past 240 hours).   Labs: Basic Metabolic Panel: Recent Labs  Lab 01/03/24 0437 01/04/24 0440 01/05/24 0347 01/06/24 0401 01/07/24 0337 01/08/24 0359  NA 145 144 145 146* 141 143  K 4.5 4.2 4.4 4.2 4.0 4.5  CL 106 108 111 109 109 112*  CO2 31 29 29 30 24 25   GLUCOSE 105* 113* 112* 111* 122* 128*  BUN 27* 28* 31* 26* 21 40*  CREATININE <0.30* <0.30* <0.30* <0.30* <0.30* <0.30*  CALCIUM 9.1 8.8* 8.9 8.9 8.5* 8.7*  MG 2.1  --   --   --   --   --   PHOS 2.4*  --   --   --   --   --    Liver Function Tests: No results for input(s): "AST", "ALT", "ALKPHOS", "BILITOT", "PROT", "ALBUMIN" in the last 168 hours. No results for input(s): "LIPASE", "AMYLASE" in the last 168 hours. No results for input(s): "AMMONIA" in the last 168 hours. CBC: Recent Labs  Lab 01/04/24 0440 01/05/24 0347 01/06/24 0401 01/07/24 0337 01/08/24 0359  WBC 8.1 7.4 6.9 7.8  10.0  NEUTROABS 5.5 4.9 4.1 5.0 8.1*  HGB 11.5* 11.1* 10.9* 11.2* 11.5*  HCT 35.6* 34.1* 33.4* 34.6* 35.7*  MCV 86.4 87.9 86.5 85.9 87.3  PLT 207 213 209 207 PLATELET CLUMPS NOTED ON SMEAR, UNABLE TO ESTIMATE   Cardiac Enzymes: No results for input(s): "CKTOTAL", "CKMB", "CKMBINDEX", "TROPONINI" in the last 168 hours. BNP: BNP (last 3 results) Recent Labs    09/11/23 1737 12/27/23 1958  BNP 100.9* 60.2    ProBNP (last 3 results) No results for input(s): "PROBNP" in the last 8760 hours.  CBG: Recent Labs  Lab 01/07/24 1523 01/07/24 1926 01/07/24 2349 01/08/24 0404 01/08/24 0756  GLUCAP 179*  150* 119* 114* 109*    Signed:  Rhetta Mura MD   [facility] 01/08/2024, 8:51 AM

## 2024-01-08 NOTE — TOC Transition Note (Signed)
 Transition of Care [facility]) - Discharge Note   Patient Details  Name: Andrew Rivera MRN: [id number] Date of Birth: May 12, 1951  [facility] CM/SW Contact:  Tom-Johnson, Hershal Coria, RN Phone Number: 01/08/2024, 9:52 AM   Clinical Narrative:     Patient is scheduled for discharge today, returning to [facility].  CM called and left secure message for patient's wife to return call about discharge.  Daughter, Granville Lewis notified, states patient's wife is not available but will inform her of the discharge.   Readmission Risk Assessment done. [facility] to transport at discharge.  No further TOC needs noted.      Final next level of care: Long Term Acute Care (LTAC) Barriers to Discharge: Barriers Resolved   Patient Goals and CMS Choice Patient states their goals for this hospitalization and ongoing recovery are:: To return to [facility] CMS Medicare.gov Compare Post Acute Care list provided to:: Patient Represenative (must comment) (Wife, [name]) Choice offered to / list presented to : Patient, Spouse      Discharge Placement                Patient to be transferred to facility by: [facility] Name of family member notified: Wife, Product manager    Discharge Plan and Services Additional resources added to the After Visit Summary for                  DME Arranged: N/A DME Agency: NA       HH Arranged: NA HH Agency: NA        Social Drivers of Health (SDOH) Interventions SDOH Screenings   Food Insecurity: Patient Unable To Answer (12/29/2023)  Housing: Patient Unable To Answer (12/29/2023)  Transportation Needs: Patient Unable To Answer (12/29/2023)  Utilities: Patient Unable To Answer (12/29/2023)  Social Connections: Patient Unable To Answer (12/29/2023)  Tobacco Use: Medium Risk (12/28/2023)     Readmission Risk Interventions    01/08/2024    9:50 AM 09/10/2023    3:49 PM  Readmission Risk Prevention Plan  Transportation Screening Complete Complete   PCP or Specialist Appt within 5-7 Days  Complete  PCP or Specialist Appt within 3-5 Days Complete   Home Care Screening  Complete  Medication Review (RN CM)  Complete  HRI or Home Care Consult Complete   Social Work Consult for Recovery Care Planning/Counseling Complete   Palliative Care Screening Not Applicable   Medication Review Oceanographer) Referral to Pharmacy

## 2024-01-29 LAB — CULTURE, RESPIRATORY W GRAM STAIN

## 2024-07-04 ENCOUNTER — Emergency Department (HOSPITAL_COMMUNITY)

## 2024-07-04 ENCOUNTER — Other Ambulatory Visit: Payer: Self-pay

## 2024-07-04 ENCOUNTER — Emergency Department (HOSPITAL_COMMUNITY)
Admission: EM | Admit: 2024-07-04 | Discharge: 2024-07-05 | Disposition: A | Discharge status: Home / Self Care | Attending: Emergency Medicine | Admitting: Emergency Medicine

## 2024-07-04 DIAGNOSIS — X58XXXA Exposure to other specified factors, initial encounter: Secondary | ICD-10-CM | POA: Insufficient documentation

## 2024-07-04 DIAGNOSIS — R0602 Shortness of breath: Secondary | ICD-10-CM | POA: Diagnosis present

## 2024-07-04 DIAGNOSIS — T17500A Unspecified foreign body in bronchus causing asphyxiation, initial encounter: Secondary | ICD-10-CM | POA: Insufficient documentation

## 2024-07-04 DIAGNOSIS — R69 Illness, unspecified: Secondary | ICD-10-CM | POA: Diagnosis not present

## 2024-07-04 DIAGNOSIS — D72829 Elevated white blood cell count, unspecified: Secondary | ICD-10-CM | POA: Diagnosis not present

## 2024-07-04 LAB — CBC WITH DIFFERENTIAL/PLATELET
Abs Immature Granulocytes: 0.07 K/uL (ref 0.00–0.07)
Basophils Absolute: 0.1 K/uL (ref 0.0–0.1)
Basophils Relative: 0 %
Eosinophils Absolute: 0.7 K/uL — ABNORMAL HIGH (ref 0.0–0.5)
Eosinophils Relative: 4 %
HCT: 36.6 % — ABNORMAL LOW (ref 39.0–52.0)
Hemoglobin: 11.6 g/dL — ABNORMAL LOW (ref 13.0–17.0)
Immature Granulocytes: 1 %
Lymphocytes Relative: 12 %
Lymphs Abs: 1.8 K/uL (ref 0.7–4.0)
MCH: 29.1 pg (ref 26.0–34.0)
MCHC: 31.7 g/dL (ref 30.0–36.0)
MCV: 91.7 fL (ref 80.0–100.0)
Monocytes Absolute: 1.1 K/uL — ABNORMAL HIGH (ref 0.1–1.0)
Monocytes Relative: 7 %
Neutro Abs: 11.9 K/uL — ABNORMAL HIGH (ref 1.7–7.7)
Neutrophils Relative %: 76 %
Platelets: 452 K/uL — ABNORMAL HIGH (ref 150–400)
RBC: 3.99 MIL/uL — ABNORMAL LOW (ref 4.22–5.81)
RDW: 15.5 % (ref 11.5–15.5)
WBC: 15.5 K/uL — ABNORMAL HIGH (ref 4.0–10.5)
nRBC: 0 % (ref 0.0–0.2)

## 2024-07-04 LAB — COMPREHENSIVE METABOLIC PANEL WITH GFR
ALT: 18 U/L (ref 0–44)
AST: 18 U/L (ref 15–41)
Albumin: 2.8 g/dL — ABNORMAL LOW (ref 3.5–5.0)
Alkaline Phosphatase: 114 U/L (ref 38–126)
Anion gap: 11 (ref 5–15)
BUN: 16 mg/dL (ref 8–23)
CO2: 27 mmol/L (ref 22–32)
Calcium: 9.1 mg/dL (ref 8.9–10.3)
Chloride: 99 mmol/L (ref 98–111)
Creatinine, Ser: <0.3 mg/dL — ABNORMAL LOW (ref 0.61–1.24)
Glucose, Bld: 126 mg/dL — ABNORMAL HIGH (ref 70–99)
Potassium: 3.9 mmol/L (ref 3.5–5.1)
Sodium: 137 mmol/L (ref 135–145)
Total Bilirubin: 0.5 mg/dL (ref 0.0–1.2)
Total Protein: 8.6 g/dL — ABNORMAL HIGH (ref 6.5–8.1)

## 2024-07-04 LAB — TROPONIN I (HIGH SENSITIVITY)
Troponin I (High Sensitivity): 9 ng/L (ref <18)
Troponin I (High Sensitivity): 10 ng/L (ref <18)

## 2024-07-04 LAB — RESP PANEL BY RT-PCR (RSV, FLU A&B, COVID)  RVPGX2
Influenza A by PCR: NEGATIVE
Influenza B by PCR: NEGATIVE
Resp Syncytial Virus by PCR: NEGATIVE
SARS Coronavirus 2 by RT PCR: NEGATIVE

## 2024-07-04 MED ORDER — ACETAMINOPHEN 325 MG PO TABS: 975.0000 mg | ORAL_TABLET | Freq: Two times a day (BID) | ORAL | Status: DC

## 2024-07-04 MED ORDER — IPRATROPIUM-ALBUTEROL 0.5-2.5 (3) MG/3ML IN SOLN: 3.0000 mL | RESPIRATORY_TRACT | Status: DC | PRN

## 2024-07-04 MED ORDER — OSMOLITE 1.5 CAL PO LIQD
50.0000 mL/h | ORAL | Status: DC
  Filled 2024-07-04: qty 237

## 2024-07-04 MED ORDER — PREGABALIN 100 MG PO CAPS: 100.0000 mg | ORAL_CAPSULE | Freq: Three times a day (TID) | ORAL | Status: DC

## 2024-07-04 MED ORDER — IOHEXOL 350 MG/ML SOLN
75.0000 mL | Freq: Once | INTRAVENOUS | Status: AC | PRN
  Administered 2024-07-04: 75 mL via INTRAVENOUS

## 2024-07-04 MED ORDER — FREE WATER: 300.0000 mL | Status: DC

## 2024-07-04 NOTE — ED Provider Notes (Signed)
 [place] EMERGENCY DEPARTMENT AT [facility] Provider Note   CSN: [id number] Arrival date & time: 07/04/24  1655     Patient presents with: Shortness of Breath   Andrew Rivera is a 73 y.o. male.  {Add pertinent medical, surgical, social history, OB history to HPI:32947} HPI Patient is coming from [facility] care facility.  Patient has complex medical history of chronic ventilator dependent quadriplegia with tracheostomy.  Patient has just finished a course of meropenem  for pneumonia.  He has a PICC line in place.  Reportedly, the patient wanted to be seen in the emergency department for worsening shortness of breath or difficulty breathing.  Verbal report from the facility advises that the patient had a repeat chest x-ray and labs that were stable.  Reportedly, the facility did not think he needed reassessment in the emergency department but the patient requested to be seen. Respiratory therapist advises that on patient's arrival she did suction large amount of secretions out and patient is satting at 100% and stable on his baseline vent settings from [facility].    Prior to Admission medications   Medication Sig Start Date End Date Taking? Authorizing Provider  acetaminophen  (TYLENOL ) 325 MG tablet Place 975 mg into feeding tube in the morning and at bedtime.    [provider]  amLODipine  (NORVASC ) 10 MG tablet Place 1 tablet (10 mg total) into feeding tube daily. 09/13/23   Gonfa, Taye T, MD  Carboxymethylcellulose Sodium 1 % GEL Place 1 drop into both eyes in the morning, at noon, and at bedtime.    [provider]  escitalopram  (LEXAPRO ) 5 MG tablet Place 1 tablet (5 mg total) into feeding tube daily. 01/08/24   Samtani, Jai-Gurmukh, MD  fentaNYL  (DURAGESIC ) 25 MCG/HR Place 1 patch onto the skin every 3 (three) days. 01/08/24   Samtani, Jai-Gurmukh, MD  guaiFENesin  (ROBITUSSIN) 100 MG/5ML liquid Place 10 mLs into feeding tube 3 (three) times daily. 01/03/24    Samtani, Jai-Gurmukh, MD  HYDROmorphone  (DILAUDID ) 2 MG tablet Place 0.5 tablets (1 mg total) into feeding tube every 8 (eight) hours as needed for severe pain (pain score 7-10). 01/08/24   Samtani, Jai-Gurmukh, MD  hydrOXYzine  (ATARAX ) 25 MG tablet Place 1 tablet (25 mg total) into feeding tube 3 (three) times daily. 01/08/24   Samtani, Jai-Gurmukh, MD  ipratropium-albuterol  (DUONEB) 0.5-2.5 (3) MG/3ML SOLN Take 3 mLs by nebulization every 4 (four) hours as needed (respiratory failure).    [provider]  levocetirizine (XYZAL ) 5 MG tablet Place 5 mg into feeding tube every evening.    [provider]  LORazepam  (ATIVAN ) 0.5 MG tablet Place 1 tablet (0.5 mg total) into feeding tube every 12 (twelve) hours as needed for anxiety. 01/08/24   Samtani, Jai-Gurmukh, MD  Melatonin 10 MG TABS Give 10 mg by tube at bedtime.    [provider]  Nutritional Supplements (FEEDING SUPPLEMENT, OSMOLITE 1.5 CAL,) LIQD Place 50 mL/hr into feeding tube continuous. 09/12/23   Gonfa, Taye T, MD  ondansetron  (ZOFRAN -ODT) 4 MG disintegrating tablet Take 4 mg by mouth every 8 (eight) hours as needed for nausea or vomiting.    [provider]  pantoprazole  sodium (PROTONIX ) 40 mg Place 40 mg into feeding tube daily. 09/12/23   [provider]  polyethylene glycol (MIRALAX  / GLYCOLAX ) 17 g packet Place 17 g into feeding tube 2 (two) times daily. Patient taking differently: Place 17 g into feeding tube daily. 06/09/21   Alto Isaiah CROME, NP  pregabalin  (LYRICA )  100 MG capsule Place 1 capsule (100 mg total) into feeding tube 3 (three) times daily. 01/08/24   Samtani, Jai-Gurmukh, MD  Protein (FEEDING SUPPLEMENT, PROSOURCE TF20,) liquid Place 60 mLs into feeding tube daily. 06/21/22   Mapp, Tavien, MD  scopolamine  (TRANSDERM-SCOP) 1 MG/3DAYS Place 1 patch onto the skin every 3 (three) days.    [provider]  Water  For Irrigation, Sterile (FREE WATER ) SOLN Place 300 mLs into  feeding tube every 4 (four) hours. 01/08/24   Samtani, Jai-Gurmukh, MD  sertraline (ZOLOFT) 50 MG tablet Place 50 mg into feeding tube at bedtime.  04/28/21  [provider]    Allergies: Chlorhexidine     Review of Systems  Updated Vital Signs Ht 5' 9 (1.753 m)   Wt 90 kg   BMI 29.30 kg/m   Physical Exam Constitutional:      Comments: Patient is significantly debilitated with central obesity and immobility on the vent.  No appearance of acute distress.  Awake and alert.  HENT:     Head: Normocephalic and atraumatic.  Neck:     Comments: Tracheostomy in place Cardiovascular:     Rate and Rhythm: Normal rate and regular rhythm.  Pulmonary:     Comments: Patient is being ventilated by the ventilator with tracheostomy.  Breath sounds are symmetric.  Occasional rhonchorous breath sounds but adequate airflow. Abdominal:     Comments: Abdomen soft.  Obese.  Feeding tube in place.  Musculoskeletal:     Comments: Have examined the patient's backside.  Patient has been rolled.  No active breakdown at this time.  No peripheral edema.  Calves are soft.  Patient does have extension contractures of both feet.  Atrophy of the muscles bilaterally. PICC line site is in the right upper arm clean and dry.  Skin:    General: Skin is warm and dry.  Neurological:     Comments: Patient is mouthing out words but difficult to understand.  He does not have any functional use of extremities.  Flaccid paralysis.     (all labs ordered are listed, but only abnormal results are displayed) Labs Reviewed - No data to display  EKG: None  Radiology: No results found.  {Document cardiac monitor, telemetry assessment procedure when appropriate:32947} Procedures   Medications Ordered in the ED - No data to display    {Click here for ABCD2, HEART and other calculators REFRESH Note before signing:1}                              Medical Decision Making  Patient brought from [facility] for  evaluation.  By report he has just finished meropenem .  Examination does not show any active wounds or breakdown.  Will obtain basic x-rays and lab work.  {Document critical care time when appropriate  Document review of labs and clinical decision tools ie CHADS2VASC2, etc  Document your independent review of radiology images and any outside records  Document your discussion with family members, caretakers and with consultants  Document social determinants of health affecting pt's care  Document your decision making why or why not admission, treatments were needed:32947:::1}   Final diagnoses:  None    ED Discharge Orders     None

## 2024-07-04 NOTE — ED Triage Notes (Addendum)
 Vent/trach quadriplegic pt coming from [facility] with SHOB. Just finished treatment for pneumonia but repeat xray shows pneumonia still present. No fevers.   Per facility, his labs were normal but the pt wanted to come here to be seen.  Right upper arm midline

## 2024-07-04 NOTE — Discharge Instructions (Signed)
 1.  You likely have a chronic problem with mucous plugging and thick secretions.  Is much as you can continue to work with your respiratory therapist and

## 2024-07-04 NOTE — ED Notes (Signed)
 Attempted to call wife to update status of pt. No answer

## 2024-07-04 NOTE — ED Notes (Signed)
 Pt's brief saturated. Changed and repositioned for comfort.

## 2024-07-04 NOTE — Progress Notes (Signed)
 Pt transported to CT and back w/o complications. RT and RN at bedside.

## 2024-07-04 NOTE — ED Notes (Signed)
 RT at bedside. Suctioned patient per his request.

## 2024-07-04 NOTE — ED Notes (Signed)
[facility] called for transport back to [facility] 

## 2024-07-05 DIAGNOSIS — R0602 Shortness of breath: Secondary | ICD-10-CM | POA: Diagnosis not present

## 2024-07-05 DIAGNOSIS — I10 Essential (primary) hypertension: Secondary | ICD-10-CM | POA: Diagnosis not present

## 2024-07-05 DIAGNOSIS — Z7401 Bed confinement status: Secondary | ICD-10-CM | POA: Diagnosis not present

## 2024-07-05 NOTE — ED Provider Notes (Incomplete)
 [place] EMERGENCY DEPARTMENT AT [facility] Provider Note   CSN: [id number] Arrival date & time: 07/04/24  1655     Patient presents with: Shortness of Breath   Andrew Rivera is a 73 y.o. male.  {Add pertinent medical, surgical, social history, OB history to HPI:32947} HPI Patient is coming from [facility] facility.  Patient has complex medical history of chronic ventilator dependent quadriplegia with tracheostomy.  Patient has just finished a course of meropenem  for pneumonia.  He has a PICC line in place.  Reportedly, the patient wanted to be seen in the emergency department for worsening shortness of breath or difficulty breathing.  Verbal report from the facility advises that the patient had a repeat chest x-ray and labs that were stable.  Reportedly, the facility did not think he needed reassessment in the emergency department but the patient requested to be seen. Respiratory therapist advises that on patient's arrival she did suction large amount of secretions out and patient is satting at 100% and stable on his baseline vent settings from [facility].    Prior to Admission medications   Medication Sig Start Date End Date Taking? Authorizing Provider  acetaminophen  (TYLENOL ) 325 MG tablet Place 975 mg into feeding tube in the morning and at bedtime.   Yes [provider]  amLODipine  (NORVASC ) 10 MG tablet Place 1 tablet (10 mg total) into feeding tube daily. 09/13/23  Yes Gonfa, Taye T, MD  Carboxymethylcellulose Sodium 1 % GEL Place 1 drop into both eyes in the morning and at bedtime.   Yes [provider]  fentaNYL  (DURAGESIC ) 25 MCG/HR Place 1 patch onto the skin every 3 (three) days. 01/08/24  Yes Samtani, Jai-Gurmukh, MD  guaiFENesin  (ROBITUSSIN) 100 MG/5ML liquid Place 10 mLs into feeding tube 3 (three) times daily. 01/03/24  Yes Samtani, Jai-Gurmukh, MD  HYDROmorphone  (DILAUDID ) 2 MG tablet Place 0.5 tablets (1 mg total) into feeding tube every 8  (eight) hours as needed for severe pain (pain score 7-10). 01/08/24  Yes Samtani, Jai-Gurmukh, MD  ipratropium-albuterol  (DUONEB) 0.5-2.5 (3) MG/3ML SOLN Take 3 mLs by nebulization every 4 (four) hours as needed (respiratory failure).   Yes [provider]  levocetirizine (XYZAL ) 5 MG tablet Place 5 mg into feeding tube every evening.   Yes [provider]  LORazepam  (ATIVAN ) 0.5 MG tablet Place 1 tablet (0.5 mg total) into feeding tube every 12 (twelve) hours as needed for anxiety. 01/08/24  Yes Samtani, Jai-Gurmukh, MD  Melatonin 10 MG TABS Give 10 mg by tube at bedtime.   Yes [provider]  Nutritional Supplements (FEEDING SUPPLEMENT, OSMOLITE 1.5 CAL,) LIQD Place 50 mL/hr into feeding tube continuous. 09/12/23  Yes Gonfa, Taye T, MD  ondansetron  (ZOFRAN -ODT) 4 MG disintegrating tablet Take 4 mg by mouth every 8 (eight) hours as needed for nausea or vomiting.   Yes [provider]  pantoprazole  sodium (PROTONIX ) 40 mg Place 40 mg into feeding tube daily. 09/12/23  Yes [provider]  polyethylene glycol (MIRALAX  / GLYCOLAX ) 17 g packet Place 17 g into feeding tube 2 (two) times daily. Patient taking differently: Place 17 g into feeding tube daily. 06/09/21  Yes Alto Isaiah CROME, NP  pregabalin  (LYRICA ) 100 MG capsule Place 1 capsule (100 mg total) into feeding tube 3 (three) times daily. 01/08/24  Yes Samtani, Jai-Gurmukh, MD  Protein (FEEDING SUPPLEMENT, PROSOURCE TF20,) liquid Place 60 mLs into feeding tube daily. 06/21/22  Yes Mapp, Tavien, MD  scopolamine  (TRANSDERM-SCOP) 1 MG/3DAYS Place 1  patch onto the skin every 3 (three) days.   Yes [provider]  Water  For Irrigation, Sterile (FREE WATER ) SOLN Place 300 mLs into feeding tube every 4 (four) hours. 01/08/24  Yes Samtani, Jai-Gurmukh, MD  sertraline (ZOLOFT) 50 MG tablet Place 50 mg into feeding tube at bedtime.  04/28/21  [provider]    Allergies: Chlorhexidine , Codeine, and  Oxycodone-acetaminophen     Review of Systems  Updated Vital Signs BP (!) 188/62   Pulse 96   Temp 98 F (36.7 C)   Resp (!) 22   Ht 5' 9 (1.753 m)   Wt 90 kg   SpO2 100%   BMI 29.30 kg/m   Physical Exam Constitutional:      Comments: Patient is significantly debilitated with central obesity and immobility on the vent.  No appearance of acute distress.  Awake and alert.  HENT:     Head: Normocephalic and atraumatic.  Neck:     Comments: Tracheostomy in place Cardiovascular:     Rate and Rhythm: Normal rate and regular rhythm.  Pulmonary:     Comments: Patient is being ventilated by the ventilator with tracheostomy.  Breath sounds are symmetric.  Occasional rhonchorous breath sounds but adequate airflow. Abdominal:     Comments: Abdomen soft.  Obese.  Feeding tube in place.  Musculoskeletal:     Comments: Have examined the patient's backside.  Patient has been rolled.  No active breakdown at this time.  No peripheral edema.  Calves are soft.  Patient does have extension contractures of both feet.  Atrophy of the muscles bilaterally. PICC line site is in the right upper arm clean and dry.  Skin:    General: Skin is warm and dry.  Neurological:     Comments: Patient is mouthing out words but difficult to understand.  He does not have any functional use of extremities.  Flaccid paralysis.     (all labs ordered are listed, but only abnormal results are displayed) Labs Reviewed  COMPREHENSIVE METABOLIC PANEL WITH GFR - Abnormal; Notable for the following components:      Result Value   Glucose, Bld 126 (*)    Creatinine, Ser <0.30 (*)    Total Protein 8.6 (*)    Albumin 2.8 (*)    All other components within normal limits  CBC WITH DIFFERENTIAL/PLATELET - Abnormal; Notable for the following components:   WBC 15.5 (*)    RBC 3.99 (*)    Hemoglobin 11.6 (*)    HCT 36.6 (*)    Platelets 452 (*)    Neutro Abs 11.9 (*)    Monocytes Absolute 1.1 (*)    Eosinophils Absolute  0.7 (*)    All other components within normal limits  RESP PANEL BY RT-PCR (RSV, FLU A&B, COVID)  RVPGX2  URINALYSIS, ROUTINE W REFLEX MICROSCOPIC  TROPONIN I (HIGH SENSITIVITY)  TROPONIN I (HIGH SENSITIVITY)    EKG: EKG Interpretation Date/Time:  Friday July 04 2024 17:10:44 EDT Ventricular Rate:  114 PR Interval:  187 QRS Duration:  79 QT Interval:  315 QTC Calculation: 434 R Axis:   57  Text Interpretation: Sinus tachycardia Consider anterior infarct tachycardia, no acute ischemic appearance Confirmed by Armenta Canning [phone]) on 07/04/2024 6:10:06 PM  Radiology: CT Angio Chest PE W/Cm &/Or Wo Cm Result Date: 07/04/2024 CLINICAL DATA:  Shortness of breath EXAM: CT ANGIOGRAPHY CHEST WITH CONTRAST TECHNIQUE: Multidetector CT imaging of the chest was performed using the standard protocol during bolus administration of intravenous contrast.  Multiplanar CT image reconstructions and MIPs were obtained to evaluate the vascular anatomy. RADIATION DOSE REDUCTION: This exam was performed according to the departmental dose-optimization program which includes automated exposure control, adjustment of the mA and/or kV according to patient size and/or use of iterative reconstruction technique. CONTRAST:  75mL OMNIPAQUE  IOHEXOL  350 MG/ML SOLN COMPARISON:  Chest x-ray from earlier in the same day, CT from 01/26/2020 FINDINGS: Cardiovascular: Atherosclerotic calcifications of the thoracic aorta are noted. No aneurysmal dilatation or dissection is noted. The heart is at the upper limits of normal in size. The pulmonary artery shows a normal branching pattern bilaterally. No filling defect to suggest pulmonary embolism is noted. Mediastinum/Nodes: Thoracic inlet is within normal limits. Tracheostomy tube is noted in place. The esophagus as visualized is within normal limits. No hilar or mediastinal adenopathy is noted. Lungs/Pleura: Lungs demonstrate bilateral lower lobe consolidation right slightly  greater than left. Considerable inspissated material is noted within the bronchial tree bilaterally. No sizable effusion is noted. Focal somewhat nodular density is noted in the posterior aspect of the right upper lobe best seen on image number 97 of series 6. This is new from the prior exam and measures approximately 3.1 x 1.8 cm. This is most likely postinflammatory in nature. No sizable parenchymal nodules are noted. Upper Abdomen: Visualized upper abdomen is within normal limits. Gastrostomy catheter is noted in place. Musculoskeletal: Degenerative and postsurgical changes are noted in the cervical and thoracic spines Review of the MIP images confirms the above findings. IMPRESSION: No evidence of pulmonary emboli. Bilateral lower lobe consolidation similar to that seen on prior plain film examination. Its assayed material is noted in the lower lobe bronchial tree. Somewhat nodular area of opacity identified in the right upper lobe felt most likely to be postinflammatory in nature. Short-term follow-up can be performed as clinically necessary. Aortic Atherosclerosis (ICD10-I70.0). Electronically Signed   By: Oneil Devonshire M.D.   On: 07/04/2024 21:47   DG Chest Port 1 View Result Date: 07/04/2024 CLINICAL DATA:  Shortness of breath. EXAM: PORTABLE CHEST 1 VIEW COMPARISON:  January 08, 2024. FINDINGS: Stable cardiomediastinal silhouette. Tracheostomy tube is unchanged. Mild central pulmonary vascular congestion and probable bilateral pulmonary edema is noted. Mild bibasilar atelectasis is noted with small pleural effusions. Bony thorax is unremarkable. IMPRESSION: Mild central pulmonary vascular congestion and probable bilateral pulmonary edema. Mild bibasilar subsegmental atelectasis with small pleural effusions. Electronically Signed   By: Lynwood Landy Raddle M.D.   On: 07/04/2024 18:01    {Document cardiac monitor, telemetry assessment procedure when appropriate:32947} Procedures   Medications Ordered in the  ED  acetaminophen  (TYLENOL ) tablet 975 mg (has no administration in time range)  ipratropium-albuterol  (DUONEB) 0.5-2.5 (3) MG/3ML nebulizer solution 3 mL (has no administration in time range)  feeding supplement (OSMOLITE 1.5 CAL) liquid (has no administration in time range)  pregabalin  (LYRICA ) capsule 100 mg (has no administration in time range)  free water  300 mL (has no administration in time range)  iohexol  (OMNIPAQUE ) 350 MG/ML injection 75 mL (75 mLs Intravenous Contrast Given 07/04/24 2132)      {Click here for ABCD2, HEART and other calculators REFRESH Note before signing:1}                              Medical Decision Making Amount and/or Complexity of Data Reviewed Labs: ordered. Radiology: ordered.  Risk OTC drugs. Prescription drug management.   Patient brought from [facility] for evaluation.  By  report he has just finished meropenem .  Examination does not show any active wounds or breakdown.  Will obtain basic x-rays and lab work.  {Document critical care time when appropriate  Document review of labs and clinical decision tools ie CHADS2VASC2, etc  Document your independent review of radiology images and any outside records  Document your discussion with family members, caretakers and with consultants  Document social determinants of health affecting pt's care  Document your decision making why or why not admission, treatments were needed:32947:::1}   Final diagnoses:  Mucus plugging of bronchi  Severe comorbid illness    ED Discharge Orders     None

## 2024-07-07 DIAGNOSIS — R000 Tachycardia, unspecified: Secondary | ICD-10-CM

## 2024-07-30 LAB — MISCELLANEOUS TEST

## 2024-11-14 ENCOUNTER — Encounter (HOSPITAL_COMMUNITY): Payer: Self-pay | Admitting: Emergency Medicine

## 2024-11-14 ENCOUNTER — Emergency Department (HOSPITAL_COMMUNITY)

## 2024-11-14 ENCOUNTER — Other Ambulatory Visit: Payer: Self-pay

## 2024-11-14 ENCOUNTER — Inpatient Hospital Stay (HOSPITAL_COMMUNITY)
Admission: EM | Admit: 2024-11-14 | Discharge: 2024-11-19 | DRG: 871 | Disposition: A | Discharge status: Other Acute Inpatient Hospital | Source: Other Acute Inpatient Hospital | Attending: Student | Admitting: Student

## 2024-11-14 ENCOUNTER — Inpatient Hospital Stay (HOSPITAL_COMMUNITY)

## 2024-11-14 DIAGNOSIS — Z79899 Other long term (current) drug therapy: Secondary | ICD-10-CM

## 2024-11-14 DIAGNOSIS — J9610 Chronic respiratory failure, unspecified whether with hypoxia or hypercapnia: Secondary | ICD-10-CM | POA: Diagnosis present

## 2024-11-14 DIAGNOSIS — Z885 Allergy status to narcotic agent status: Secondary | ICD-10-CM

## 2024-11-14 DIAGNOSIS — G9340 Encephalopathy, unspecified: Secondary | ICD-10-CM | POA: Diagnosis present

## 2024-11-14 DIAGNOSIS — F32A Depression, unspecified: Secondary | ICD-10-CM | POA: Diagnosis present

## 2024-11-14 DIAGNOSIS — J15212 Pneumonia due to Methicillin resistant Staphylococcus aureus: Secondary | ICD-10-CM | POA: Diagnosis not present

## 2024-11-14 DIAGNOSIS — I10 Essential (primary) hypertension: Secondary | ICD-10-CM | POA: Diagnosis present

## 2024-11-14 DIAGNOSIS — R532 Functional quadriplegia: Secondary | ICD-10-CM | POA: Diagnosis present

## 2024-11-14 DIAGNOSIS — B965 Pseudomonas (aeruginosa) (mallei) (pseudomallei) as the cause of diseases classified elsewhere: Secondary | ICD-10-CM | POA: Diagnosis present

## 2024-11-14 DIAGNOSIS — A419 Sepsis, unspecified organism: Secondary | ICD-10-CM | POA: Diagnosis present

## 2024-11-14 DIAGNOSIS — Z888 Allergy status to other drugs, medicaments and biological substances status: Secondary | ICD-10-CM

## 2024-11-14 DIAGNOSIS — G4700 Insomnia, unspecified: Secondary | ICD-10-CM | POA: Diagnosis present

## 2024-11-14 DIAGNOSIS — J188 Other pneumonia, unspecified organism: Secondary | ICD-10-CM | POA: Diagnosis not present

## 2024-11-14 DIAGNOSIS — F411 Generalized anxiety disorder: Secondary | ICD-10-CM | POA: Diagnosis present

## 2024-11-14 DIAGNOSIS — I959 Hypotension, unspecified: Secondary | ICD-10-CM | POA: Diagnosis not present

## 2024-11-14 DIAGNOSIS — G9341 Metabolic encephalopathy: Secondary | ICD-10-CM | POA: Diagnosis present

## 2024-11-14 DIAGNOSIS — L89890 Pressure ulcer of other site, unstageable: Secondary | ICD-10-CM | POA: Diagnosis present

## 2024-11-14 DIAGNOSIS — E669 Obesity, unspecified: Secondary | ICD-10-CM | POA: Diagnosis present

## 2024-11-14 DIAGNOSIS — Z8619 Personal history of other infectious and parasitic diseases: Secondary | ICD-10-CM

## 2024-11-14 DIAGNOSIS — M549 Dorsalgia, unspecified: Secondary | ICD-10-CM | POA: Diagnosis present

## 2024-11-14 DIAGNOSIS — Y848 Other medical procedures as the cause of abnormal reaction of the patient, or of later complication, without mention of misadventure at the time of the procedure: Secondary | ICD-10-CM | POA: Diagnosis present

## 2024-11-14 DIAGNOSIS — R197 Diarrhea, unspecified: Secondary | ICD-10-CM | POA: Diagnosis present

## 2024-11-14 DIAGNOSIS — Z9911 Dependence on respirator [ventilator] status: Secondary | ICD-10-CM

## 2024-11-14 DIAGNOSIS — B964 Proteus (mirabilis) (morganii) as the cause of diseases classified elsewhere: Secondary | ICD-10-CM | POA: Diagnosis present

## 2024-11-14 DIAGNOSIS — E876 Hypokalemia: Secondary | ICD-10-CM | POA: Diagnosis present

## 2024-11-14 DIAGNOSIS — Z931 Gastrostomy status: Secondary | ICD-10-CM | POA: Diagnosis not present

## 2024-11-14 DIAGNOSIS — L89316 Pressure-induced deep tissue damage of right buttock: Secondary | ICD-10-CM | POA: Diagnosis present

## 2024-11-14 DIAGNOSIS — Z930 Tracheostomy status: Secondary | ICD-10-CM | POA: Diagnosis not present

## 2024-11-14 DIAGNOSIS — R6520 Severe sepsis without septic shock: Secondary | ICD-10-CM | POA: Diagnosis present

## 2024-11-14 DIAGNOSIS — J95851 Ventilator associated pneumonia: Secondary | ICD-10-CM | POA: Diagnosis present

## 2024-11-14 DIAGNOSIS — Z87891 Personal history of nicotine dependence: Secondary | ICD-10-CM

## 2024-11-14 DIAGNOSIS — Z1624 Resistance to multiple antibiotics: Secondary | ICD-10-CM | POA: Diagnosis present

## 2024-11-14 DIAGNOSIS — E66811 Obesity, class 1: Secondary | ICD-10-CM | POA: Diagnosis present

## 2024-11-14 DIAGNOSIS — Z7401 Bed confinement status: Secondary | ICD-10-CM

## 2024-11-14 DIAGNOSIS — R1310 Dysphagia, unspecified: Secondary | ICD-10-CM | POA: Diagnosis present

## 2024-11-14 DIAGNOSIS — G894 Chronic pain syndrome: Secondary | ICD-10-CM | POA: Diagnosis present

## 2024-11-14 DIAGNOSIS — J189 Pneumonia, unspecified organism: Principal | ICD-10-CM

## 2024-11-14 DIAGNOSIS — Z6829 Body mass index (BMI) 29.0-29.9, adult: Secondary | ICD-10-CM

## 2024-11-14 LAB — COMPREHENSIVE METABOLIC PANEL WITH GFR
ALT: 16 U/L (ref 0–44)
AST: 21 U/L (ref 15–41)
Albumin: 3.6 g/dL (ref 3.5–5.0)
Alkaline Phosphatase: 149 U/L — ABNORMAL HIGH (ref 38–126)
Anion gap: 10 (ref 5–15)
BUN: 12 mg/dL (ref 8–23)
CO2: 31 mmol/L (ref 22–32)
Calcium: 9.5 mg/dL (ref 8.9–10.3)
Chloride: 93 mmol/L — ABNORMAL LOW (ref 98–111)
Creatinine, Ser: <0.3 mg/dL — ABNORMAL LOW (ref 0.61–1.24)
Glucose, Bld: 174 mg/dL — ABNORMAL HIGH (ref 70–99)
Potassium: 3.8 mmol/L (ref 3.5–5.1)
Sodium: 134 mmol/L — ABNORMAL LOW (ref 135–145)
Total Bilirubin: 0.6 mg/dL (ref 0.0–1.2)
Total Protein: 9 g/dL — ABNORMAL HIGH (ref 6.5–8.1)

## 2024-11-14 LAB — CBC
HCT: 42 % (ref 39.0–52.0)
HCT: 41.5 % (ref 39.0–52.0)
Hemoglobin: 13.3 g/dL (ref 13.0–17.0)
Hemoglobin: 13.1 g/dL (ref 13.0–17.0)
MCH: 28.9 pg (ref 26.0–34.0)
MCH: 28.6 pg (ref 26.0–34.0)
MCHC: 31.7 g/dL (ref 30.0–36.0)
MCHC: 31.6 g/dL (ref 30.0–36.0)
MCV: 91.1 fL (ref 80.0–100.0)
MCV: 90.6 fL (ref 80.0–100.0)
Platelets: 389 K/uL (ref 150–400)
Platelets: 357 K/uL (ref 150–400)
RBC: 4.61 MIL/uL (ref 4.22–5.81)
RBC: 4.58 MIL/uL (ref 4.22–5.81)
RDW: 14.9 % (ref 11.5–15.5)
RDW: 14.8 % (ref 11.5–15.5)
WBC: 23.2 K/uL — ABNORMAL HIGH (ref 4.0–10.5)
WBC: 20.7 K/uL — ABNORMAL HIGH (ref 4.0–10.5)
nRBC: 0 % (ref 0.0–0.2)
nRBC: 0 % (ref 0.0–0.2)

## 2024-11-14 LAB — RESP PANEL BY RT-PCR (RSV, FLU A&B, COVID)  RVPGX2
Influenza A by PCR: NEGATIVE
Influenza B by PCR: NEGATIVE
Resp Syncytial Virus by PCR: NEGATIVE
SARS Coronavirus 2 by RT PCR: NEGATIVE

## 2024-11-14 LAB — CREATININE, SERUM: Creatinine, Ser: <0.3 mg/dL — ABNORMAL LOW (ref 0.61–1.24)

## 2024-11-14 LAB — MRSA NEXT GEN BY PCR, NASAL: MRSA by PCR Next Gen: DETECTED — AB

## 2024-11-14 LAB — PRO BRAIN NATRIURETIC PEPTIDE: Pro Brain Natriuretic Peptide: 413 pg/mL — ABNORMAL HIGH

## 2024-11-14 LAB — I-STAT CG4 LACTIC ACID, ED: Lactic Acid, Venous: 0.8 mmol/L (ref 0.5–1.9)

## 2024-11-14 MED ORDER — DULOXETINE HCL 30 MG PO CPEP
30.0000 mg | ORAL_CAPSULE | Freq: Two times a day (BID) | ORAL | Status: DC
  Administered 2024-11-14 – 2024-11-19 (×6): 30 mg via ORAL
  Filled 2024-11-14 (×6): qty 1

## 2024-11-14 MED ORDER — SCOPOLAMINE 1 MG/3DAYS TD PT72
1.0000 | MEDICATED_PATCH | TRANSDERMAL | Status: DC
  Administered 2024-11-15 – 2024-11-18 (×2): 1 mg via TRANSDERMAL
  Filled 2024-11-14 (×2): qty 1

## 2024-11-14 MED ORDER — LORAZEPAM 0.5 MG PO TABS: 0.5000 mg | ORAL_TABLET | Freq: Two times a day (BID) | ORAL | Status: DC | PRN

## 2024-11-14 MED ORDER — PROSOURCE TF20 ENFIT COMPATIBL EN LIQD
60.0000 mL | Freq: Every day | ENTERAL | Status: DC
  Administered 2024-11-15 – 2024-11-17 (×3): 60 mL
  Filled 2024-11-14 (×3): qty 60

## 2024-11-14 MED ORDER — ENOXAPARIN SODIUM 40 MG/0.4ML IJ SOSY
40.0000 mg | PREFILLED_SYRINGE | INTRAMUSCULAR | Status: DC
  Administered 2024-11-14 – 2024-11-18 (×5): 40 mg via SUBCUTANEOUS
  Filled 2024-11-14 (×5): qty 0.4

## 2024-11-14 MED ORDER — AMLODIPINE BESYLATE 10 MG PO TABS
10.0000 mg | ORAL_TABLET | Freq: Every day | ORAL | Status: DC
  Administered 2024-11-15 – 2024-11-19 (×4): 10 mg via NASOGASTRIC
  Filled 2024-11-14 (×5): qty 1

## 2024-11-14 MED ORDER — PREGABALIN 75 MG PO CAPS
100.0000 mg | ORAL_CAPSULE | Freq: Three times a day (TID) | ORAL | Status: DC
  Administered 2024-11-14 – 2024-11-19 (×15): 100 mg
  Filled 2024-11-14 (×16): qty 1

## 2024-11-14 MED ORDER — SODIUM CHLORIDE 0.9 % IV SOLN
1.0000 g | Freq: Three times a day (TID) | INTRAVENOUS | Status: DC
  Administered 2024-11-14 – 2024-11-17 (×10): 1 g via INTRAVENOUS
  Filled 2024-11-14 (×10): qty 20

## 2024-11-14 MED ORDER — CARBOXYMETHYLCELLULOSE SODIUM 1 % OP GEL: 1.0000 [drp] | Freq: Every day | OPHTHALMIC | Status: DC

## 2024-11-14 MED ORDER — ORAL CARE MOUTH RINSE
15.0000 mL | OROMUCOSAL | Status: DC
  Administered 2024-11-14 – 2024-11-19 (×57): 15 mL via OROMUCOSAL

## 2024-11-14 MED ORDER — ESOMEPRAZOLE MAGNESIUM 40 MG PO PACK: 40.0000 mg | PACK | Freq: Every day | ORAL | Status: DC

## 2024-11-14 MED ORDER — POLYETHYLENE GLYCOL 3350 17 G PO PACK: 17.0000 g | PACK | Freq: Every day | ORAL | Status: DC | PRN

## 2024-11-14 MED ORDER — GUAIFENESIN 100 MG/5ML PO LIQD
10.0000 mL | Freq: Three times a day (TID) | ORAL | Status: DC
  Administered 2024-11-14 – 2024-11-19 (×15): 10 mL
  Filled 2024-11-14 (×15): qty 10

## 2024-11-14 MED ORDER — ONDANSETRON 4 MG PO TBDP: 4.0000 mg | ORAL_TABLET | Freq: Three times a day (TID) | ORAL | Status: DC | PRN

## 2024-11-14 MED ORDER — PANTOPRAZOLE SODIUM 40 MG PO PACK: 40.0000 mg | PACK | Freq: Every day | ORAL | Status: DC

## 2024-11-14 MED ORDER — FENTANYL 25 MCG/HR TD PT72
1.0000 | MEDICATED_PATCH | TRANSDERMAL | Status: DC
  Administered 2024-11-15 – 2024-11-18 (×2): 1 via TRANSDERMAL
  Filled 2024-11-14 (×2): qty 1

## 2024-11-14 MED ORDER — OSMOLITE 1.5 CAL PO LIQD
50.0000 mL/h | ORAL | Status: DC
  Administered 2024-11-14 – 2024-11-16 (×2): 50 mL/h
  Filled 2024-11-14 (×6): qty 1000
  Filled 2024-11-14: qty 237
  Filled 2024-11-14 (×2): qty 1000
  Filled 2024-11-14: qty 237

## 2024-11-14 MED ORDER — ORAL CARE MOUTH RINSE: 15.0000 mL | OROMUCOSAL | Status: DC | PRN

## 2024-11-14 MED ORDER — LEVOCETIRIZINE DIHYDROCHLORIDE 5 MG PO TABS: 5.0000 mg | ORAL_TABLET | Freq: Every evening | ORAL | Status: DC

## 2024-11-14 MED ORDER — HYDROMORPHONE HCL 2 MG PO TABS
1.0000 mg | ORAL_TABLET | Freq: Three times a day (TID) | ORAL | Status: DC | PRN
  Administered 2024-11-16 (×2): 1 mg
  Filled 2024-11-14 (×3): qty 1

## 2024-11-14 MED ORDER — FUROSEMIDE 20 MG PO TABS
20.0000 mg | ORAL_TABLET | Freq: Every day | ORAL | Status: DC
  Administered 2024-11-15 – 2024-11-19 (×5): 20 mg
  Filled 2024-11-14 (×5): qty 1

## 2024-11-14 MED ORDER — PIPERACILLIN-TAZOBACTAM 3.375 G IVPB 30 MIN
3.3750 g | Freq: Once | INTRAVENOUS | Status: AC
  Administered 2024-11-14: 3.375 g via INTRAVENOUS
  Filled 2024-11-14: qty 50

## 2024-11-14 MED ORDER — FREE WATER
300.0000 mL | Status: DC
  Administered 2024-11-14 – 2024-11-19 (×27): 300 mL

## 2024-11-14 MED ORDER — LORATADINE 10 MG PO TABS
10.0000 mg | ORAL_TABLET | Freq: Every day | ORAL | Status: DC
  Administered 2024-11-15 – 2024-11-19 (×5): 10 mg
  Filled 2024-11-14 (×5): qty 1

## 2024-11-14 MED ORDER — IPRATROPIUM-ALBUTEROL 0.5-2.5 (3) MG/3ML IN SOLN
3.0000 mL | Freq: Four times a day (QID) | RESPIRATORY_TRACT | Status: DC
  Administered 2024-11-14 – 2024-11-19 (×20): 3 mL via RESPIRATORY_TRACT
  Filled 2024-11-14 (×20): qty 3

## 2024-11-14 MED ORDER — PANTOPRAZOLE SODIUM 40 MG IV SOLR
40.0000 mg | INTRAVENOUS | Status: DC
  Administered 2024-11-15 – 2024-11-19 (×5): 40 mg via INTRAVENOUS
  Filled 2024-11-14 (×5): qty 10

## 2024-11-14 MED ORDER — MELATONIN 5 MG PO TABS
10.0000 mg | ORAL_TABLET | Freq: Every day | ORAL | Status: DC
  Administered 2024-11-14 – 2024-11-18 (×5): 10 mg
  Filled 2024-11-14 (×5): qty 2

## 2024-11-14 MED ORDER — POLYVINYL ALCOHOL 1.4 % OP SOLN
1.0000 [drp] | Freq: Every day | OPHTHALMIC | Status: DC
  Administered 2024-11-15 – 2024-11-19 (×5): 1 [drp] via OPHTHALMIC
  Filled 2024-11-14: qty 15

## 2024-11-14 NOTE — ED Notes (Signed)
 [facility] called to arrange transportation to [facility]. RN made aware of update.

## 2024-11-14 NOTE — ED Triage Notes (Signed)
 BIB [facility] from [facility], EMS reports increased lethargy, abnormal baseline x 1 week, increased secretions, pt was also noted to be hypertensive this AM. Pt has midline to rt upper extremity.   BP 155/59 Spo2 95 HR 98 Resp 22

## 2024-11-14 NOTE — H&P (Signed)
 " History and Physical    Patient: Andrew Rivera FMW:[id number] DOB: 1951/07/02 DOA: 11/14/2024 DOS: the patient was seen and examined on 11/14/2024 PCP: Carolee Darin BRAVO, MD  Patient coming from: Home  Chief Complaint:  Chief Complaint  Patient presents with   Altered Mental Status   HPI: Andrew Rivera is a 74 y.o. male with medical history significant of chronic respiratory failure on vent with trach/PEG (usually on FiO2 40, respiratory rate 16, and PEEP of 5), quadriplegia secondary to underlying chronic back surgeries and eventual debility, obesity, anxiety/depression, and last admission to [facility] in 12/2203 for ESBL bacteremia/VAP (tracheal aspirate grew E. coli, [place] Rettgeri, Pseudomonas aeruginosa additionally; pt treated with IV meropenem  and bactrim  x7 days) who p/w acute encephalopathy iso VAP.  Pt is unable to provide a medical history given chronic trach. His wife at the bedside, notes that pt has not been himself since she saw him yesterday. According to her, he was started in IV lasix  followed by oral lasix  upon the doctor's determining that he had fluid on his lungs and improved somewhat; however, she was called this morning by providers at facility letting her know that he was being transported to the ED for further evaluation and c/f infection given worsening mentation.  In the ED, pt hypertensive, tachycardic, and tachypneic. Labs notable for WBC 23.2, lactic acid 0.8, and pro-BNP 413. Resp panel neg for influenza, COVID-19 and RSV. CTH w/ NAICA. CXR showed vascular congestion with patchy and nodular airspace disease bilaterally, as well as bilateral pleural effusions. EDP requested TRH admission to [facility] ICU with CCM managing vent.   Review of Systems: As mentioned in the history of present illness. All other systems reviewed and are negative. Past Medical History:  Diagnosis Date   Acute on chronic respiratory failure with hypoxia (HCC)    Allergic rhinitis    Altered  mental status, unspecified    Chronic pain syndrome    Critical illness myopathy    Depression    GAD (generalized anxiety disorder)    Internal jugular (IJ) vein thromboembolism, acute, unspecified laterality (HCC)    Septic shock (HCC)    Past Surgical History:  Procedure Laterality Date   CYSTOSCOPY WITH FULGERATION N/A 09/09/2023   Procedure: CYSTOSCOPY WITH FULGERATION; CLOT EVACUATION;  TUR;  Surgeon: Shona Layman BROCKS, MD;  Location: [facility] ORS;  Service: Urology;  Laterality: N/A;   IR REPLC GASTRO/COLONIC TUBE PERCUT W/FLUORO  07/05/2021   Social History:  reports that he quit smoking about 34 years ago. His smoking use included cigarettes. He smoked an average of 1 pack per day. He has never used smokeless tobacco. He reports that he does not currently use alcohol . He reports that he does not use drugs.  Allergies[1]  History reviewed. No pertinent family history.  Prior to Admission medications  Medication Sig Start Date End Date Taking? Authorizing Provider  amLODipine  (NORVASC ) 10 MG tablet Place 1 tablet (10 mg total) into feeding tube daily. 09/13/23  Yes Gonfa, Taye T, MD  Carboxymethylcellulose Sodium 1 % GEL Place 1 drop into both eyes daily.   Yes [provider]  DULoxetine  (CYMBALTA ) 30 MG capsule 30 mg 2 (two) times daily. Per tube   Yes [provider]  esomeprazole (NEXIUM) 40 MG packet 40 mg daily. Per tube   Yes [provider]  fentaNYL  (DURAGESIC ) 25 MCG/HR Place 1 patch onto the skin every 3 (three) days. 01/08/24  Yes Samtani, Jai-Gurmukh, MD  furosemide  (LASIX ) 20 MG tablet  Take 20 mg by mouth daily.   Yes [provider]  guaiFENesin  (ROBITUSSIN) 100 MG/5ML liquid Place 10 mLs into feeding tube 3 (three) times daily. Patient taking differently: Place 10 mLs into feeding tube daily. 01/03/24  Yes Samtani, Jai-Gurmukh, MD  HYDROmorphone  (DILAUDID ) 2 MG tablet Place 0.5 tablets (1 mg total) into feeding tube every 8 (eight) hours  as needed for severe pain (pain score 7-10). 01/08/24  Yes Samtani, Jai-Gurmukh, MD  ipratropium-albuterol  (DUONEB) 0.5-2.5 (3) MG/3ML SOLN Take 3 mLs by nebulization every 6 (six) hours.   Yes [provider]  levocetirizine (XYZAL ) 5 MG tablet Place 5 mg into feeding tube every evening.   Yes [provider]  loperamide (IMODIUM A-D) 2 MG tablet Take 2 mg by mouth every 6 (six) hours as needed for diarrhea or loose stools.   Yes [provider]  Melatonin 10 MG TABS Give 10 mg by tube at bedtime.   Yes [provider]  ondansetron  (ZOFRAN -ODT) 4 MG disintegrating tablet Take 4 mg by mouth every 8 (eight) hours as needed for nausea or vomiting.   Yes [provider]  polyethylene glycol (MIRALAX  / GLYCOLAX ) 17 g packet Place 17 g into feeding tube 2 (two) times daily. Patient taking differently: Place 17 g into feeding tube daily as needed for moderate constipation. 06/09/21  Yes Alto Isaiah CROME, NP  pregabalin  (LYRICA ) 100 MG capsule Place 1 capsule (100 mg total) into feeding tube 3 (three) times daily. 01/08/24  Yes Samtani, Jai-Gurmukh, MD  Probiotic Product (PROBIOTIC PO) Place 1 capsule into feeding tube daily.   Yes [provider]  Protein (FEEDING SUPPLEMENT, PROSOURCE TF20,) liquid Place 60 mLs into feeding tube daily. 06/21/22  Yes Mapp, Tavien, MD  scopolamine  (TRANSDERM-SCOP) 1 MG/3DAYS Place 1 patch onto the skin every 3 (three) days.   Yes [provider]  acetaminophen  (TYLENOL ) 325 MG tablet Place 975 mg into feeding tube in the morning and at bedtime.    [provider]  LORazepam  (ATIVAN ) 0.5 MG tablet Place 1 tablet (0.5 mg total) into feeding tube every 12 (twelve) hours as needed for anxiety. 01/08/24   Samtani, Jai-Gurmukh, MD  Nutritional Supplements (FEEDING SUPPLEMENT, OSMOLITE 1.5 CAL,) LIQD Place 50 mL/hr into feeding tube continuous. 09/12/23   Gonfa, Taye T, MD  pantoprazole  sodium (PROTONIX ) 40 mg Place  40 mg into feeding tube daily. 09/12/23   [provider]  Water  For Irrigation, Sterile (FREE WATER ) SOLN Place 300 mLs into feeding tube every 4 (four) hours. 01/08/24   Samtani, Jai-Gurmukh, MD  sertraline (ZOLOFT) 50 MG tablet Place 50 mg into feeding tube at bedtime.  04/28/21  [provider]    Physical Exam: Vitals:   11/14/24 1000 11/14/24 1028 11/14/24 1123 11/14/24 1230  BP: (!) 148/55   (!) 128/50  Pulse: 92  92 88  Resp: (!) 22  (!) 22 (!) 22  Temp:  98.8 F (37.1 C)    TempSrc:  Oral    SpO2: 98%  100% 100%  Weight:      Height:       General: Alert, oriented x3, resting comfortably in no acute distress Respiratory: Diffuse rhonchi throughout all lung fields; no wheezing Cardiovascular: Regular rate and rhythm w/o m/r/g Abdomen: Soft, nontender, nondistended. Positive bowel sounds   Data Reviewed:  Lab Results  Component Value Date   WBC 23.2 (H) 11/14/2024   HGB 13.3 11/14/2024   HCT 42.0 11/14/2024   MCV 91.1 11/14/2024  PLT 389 11/14/2024   Lab Results  Component Value Date   GLUCOSE 174 (H) 11/14/2024   CALCIUM 9.5 11/14/2024   NA 134 (L) 11/14/2024   K 3.8 11/14/2024   CO2 31 11/14/2024   CL 93 (L) 11/14/2024   BUN 12 11/14/2024   CREATININE <0.30 (L) 11/14/2024   Lab Results  Component Value Date   ALT 16 11/14/2024   AST 21 11/14/2024   ALKPHOS 149 (H) 11/14/2024   BILITOT 0.6 11/14/2024   Lab Results  Component Value Date   INR 1.0 09/07/2023   Radiology: CT Head Wo Contrast Result Date: 11/14/2024 EXAM: CT HEAD WITHOUT CONTRAST 11/14/2024 11:13:33 AM TECHNIQUE: CT of the head was performed without the administration of intravenous contrast. Automated exposure control, iterative reconstruction, and/or weight based adjustment of the mA/kV was utilized to reduce the radiation dose to as low as reasonably achievable. COMPARISON: CT head 06/15/2022. CLINICAL HISTORY: Delirium. FINDINGS: BRAIN AND VENTRICLES: No acute  hemorrhage. No evidence of acute infarct. No hydrocephalus. No extra-axial collection. No mass effect or midline shift. ORBITS: No acute abnormality. SINUSES: No acute abnormality. SOFT TISSUES AND SKULL: No acute soft tissue abnormality. No skull fracture. IMPRESSION: 1. No acute intracranial abnormality. Electronically signed by: Ryan Chess MD 11/14/2024 11:18 AM EST RP Workstation: [id number]   DG Chest Port 1 View Result Date: 11/14/2024 CLINICAL DATA:  Shortness of breath.  Ventilator dependence. EXAM: PORTABLE CHEST 1 VIEW COMPARISON:  07/04/2024 FINDINGS: Tracheostomy tube remains in place. The cardio pericardial silhouette is enlarged. Vascular congestion with patchy and nodular airspace disease bilaterally. Bilateral pleural effusions evident. Telemetry leads overlie the chest. IMPRESSION: 1. Vascular congestion with patchy and nodular airspace disease bilaterally. Findings may reflect infectious/inflammatory etiology but close follow-up warranted. 2. Bilateral pleural effusions. Electronically Signed   By: Camellia Candle M.D.   On: 11/14/2024 07:24    Assessment and Plan: 89M h/o chronic respiratory failure on vent with trach/PEG (usually on FiO2 40, respiratory rate 16, and PEEP of 5), quadriplegia secondary to underlying chronic back surgeries and eventual debility, obesity, anxiety/depression, and last admission to [facility] in 12/2203 for ESBL bacteremia/VAP (tracheal aspirate grew E. coli, [place] Rettgeri, Pseudomonas aeruginosa additionally; pt treated with IV meropenem  and bactrim  x7 days) who p/w acute encephalopathy iso VAP.  Acute encephalopathy iso VAP -IV meropenem  per pharmacy protocol for now -F/u CT chest WO -F/u blood cultures; will need TTE and  consult if positive  Chronic respiratory failure s/p vent/trach/PEG -CCM consulted for vent mgt; apprec eval/recs  HTN -PTA amlodipine  10mg  daily  Anxiety/depression -PTA duloxetine  30mg  BID, lorazepam  0.5 mg BID  prn   Advance Care Planning:   Code Status: Full Code   Consults: CCM  Family Communication: N/A  Severity of Illness: The appropriate patient status for this patient is INPATIENT. Inpatient status is judged to be reasonable and necessary in order to provide the required intensity of service to ensure the patient's safety. The patient's presenting symptoms, physical exam findings, and initial radiographic and laboratory data in the context of their chronic comorbidities is felt to place them at high risk for further clinical deterioration. Furthermore, it is not anticipated that the patient will be medically stable for discharge from the hospital within 2 midnights of admission.   * I certify that at the point of admission it is my clinical judgment that the patient will require inpatient hospital care spanning beyond 2 midnights from the point of admission due to high intensity of service, high risk for  further deterioration and high frequency of surveillance required.*   ------- I spent 61 minutes reviewing previous notes, at the bedside counseling/discussing the treatment plan, and performing clinical documentation.  Author: Marsha Ada, MD 11/14/2024 12:58 PM  For on call review [email or web address].      [1]  Allergies Allergen Reactions   Chlorhexidine  Itching and Rash   Codeine Nausea Only   Oxycodone-Acetaminophen  Nausea Only    Other reaction(s): Hallucinations   "

## 2024-11-14 NOTE — Progress Notes (Signed)
 Pt transported from ED to CT and back on vent w/o complications.

## 2024-11-14 NOTE — ED Provider Notes (Signed)
 " [facility] Provider Note   CSN: [id number] Arrival date & time: 11/14/24  9377     Patient presents with: Altered Mental Status   Andrew Rivera is a 74 y.o. male.    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  Brought in by EMS who reports that he has had increased lethargy over the past 1 week.  Seems that at baseline he is nonverbal mouths words and is paralyzed and bedbound.  Patient is able to mouth words it is difficult to interpret however RN at be at bedside are able to discern that he is not having any pain but is feeling short of breath.   Discussed with RN at SNF and apparently this morning patient was more confused and somnolent than usual and O2 sats were dropping they increased FiO2 but his mental status did not improve and they transported him to the emergency room.     Prior to Admission medications  Medication Sig Start Date End Date Taking? Authorizing Provider  amLODipine  (NORVASC ) 10 MG tablet Place 1 tablet (10 mg total) into feeding tube daily. 09/13/23  Yes Gonfa, Taye T, MD  Carboxymethylcellulose Sodium 1 % GEL Place 1 drop into both eyes daily.   Yes [provider]  DULoxetine  (CYMBALTA ) 30 MG capsule 30 mg 2 (two) times daily. Per tube   Yes [provider]  esomeprazole (NEXIUM) 40 MG packet 40 mg daily. Per tube   Yes [provider]  fentaNYL  (DURAGESIC ) 25 MCG/HR Place 1 patch onto the skin every 3 (three) days. 01/08/24  Yes Samtani, Jai-Gurmukh, MD  furosemide  (LASIX ) 20 MG tablet Take 20 mg by mouth daily.   Yes [provider]  guaiFENesin  (ROBITUSSIN) 100 MG/5ML liquid Place 10 mLs into feeding tube 3 (three) times daily. Patient taking differently: Place 10 mLs into feeding tube daily. 01/03/24  Yes Samtani, Jai-Gurmukh, MD  HYDROmorphone  (DILAUDID ) 2 MG tablet Place 0.5 tablets (1 mg  total) into feeding tube every 8 (eight) hours as needed for severe pain (pain score 7-10). 01/08/24  Yes Samtani, Jai-Gurmukh, MD  ipratropium-albuterol  (DUONEB) 0.5-2.5 (3) MG/3ML SOLN Take 3 mLs by nebulization every 6 (six) hours.   Yes [provider]  levocetirizine (XYZAL ) 5 MG tablet Place 5 mg into feeding tube every evening.   Yes [provider]  loperamide (IMODIUM A-D) 2 MG tablet Take 2 mg by mouth every 6 (six) hours as needed for diarrhea or loose stools.   Yes [provider]  Melatonin 10 MG TABS Give 10 mg by tube at bedtime.   Yes [provider]  ondansetron  (ZOFRAN -ODT) 4 MG disintegrating tablet Take 4 mg by mouth every 8 (eight) hours as needed for nausea or vomiting.   Yes [provider]  polyethylene glycol (MIRALAX  / GLYCOLAX ) 17 g packet Place 17 g into feeding tube 2 (two) times daily. Patient taking differently: Place 17 g into feeding tube daily as needed for moderate constipation. 06/09/21  Yes Alto Isaiah CROME, NP  pregabalin  (LYRICA ) 100 MG capsule Place 1 capsule (100 mg total) into feeding tube 3 (three) times daily. 01/08/24  Yes Samtani, Jai-Gurmukh, MD  Probiotic Product (PROBIOTIC PO) Place 1 capsule into feeding tube daily.   Yes [provider]  Protein (FEEDING SUPPLEMENT, PROSOURCE TF20,) liquid Place 60 mLs into feeding tube daily. 06/21/22  Yes Mapp, Tavien, MD  scopolamine  (  TRANSDERM-SCOP) 1 MG/3DAYS Place 1 patch onto the skin every 3 (three) days.   Yes [provider]  acetaminophen  (TYLENOL ) 325 MG tablet Place 975 mg into feeding tube in the morning and at bedtime.    [provider]  LORazepam  (ATIVAN ) 0.5 MG tablet Place 1 tablet (0.5 mg total) into feeding tube every 12 (twelve) hours as needed for anxiety. 01/08/24   Samtani, Jai-Gurmukh, MD  Nutritional Supplements (FEEDING SUPPLEMENT, OSMOLITE 1.5 CAL,) LIQD Place 50 mL/hr into feeding tube continuous. 09/12/23   Gonfa, Taye T,  MD  pantoprazole  sodium (PROTONIX ) 40 mg Place 40 mg into feeding tube daily. 09/12/23   [provider]  Water  For Irrigation, Sterile (FREE WATER ) SOLN Place 300 mLs into feeding tube every 4 (four) hours. 01/08/24   Samtani, Jai-Gurmukh, MD  sertraline (ZOLOFT) 50 MG tablet Place 50 mg into feeding tube at bedtime.  04/28/21  [provider]    Allergies: Chlorhexidine , Codeine, and Oxycodone-acetaminophen     Review of Systems  Updated Vital Signs BP (!) 148/55   Pulse 92   Temp 98.8 F (37.1 C) (Oral)   Resp (!) 22   Ht 5' 9 (1.753 m)   Wt 90 kg   SpO2 100%   BMI 29.30 kg/m   Physical Exam Vitals and nursing note reviewed.  Constitutional:      Appearance: He is ill-appearing.     Comments: Chronically ill-appearing 74 year old gentleman with tracheostomy in place and bilateral foot padding/bolsters.  HENT:     Head: Normocephalic and atraumatic.     Nose: Nose normal.     Mouth/Throat:     Mouth: Mucous membranes are moist.     Comments: Some clear foamy saliva at mouth which was suctioned Eyes:     General: No scleral icterus. Cardiovascular:     Rate and Rhythm: Normal rate and regular rhythm.     Pulses: Normal pulses.     Heart sounds: Normal heart sounds.  Pulmonary:     Effort: Pulmonary effort is normal. No respiratory distress.     Breath sounds: No wheezing.  Abdominal:     Palpations: Abdomen is soft.     Tenderness: There is no abdominal tenderness.  Genitourinary:    Comments: Watery diarrhea, some breakdown of sacral skin  Musculoskeletal:     Cervical back: Normal range of motion.     Right lower leg: No edema.     Left lower leg: No edema.  Skin:    General: Skin is warm and dry.     Capillary Refill: Capillary refill takes less than 2 seconds.     Comments: Right lateral lower leg with wound that is clean and bandage last changed 1/21  Neurological:     Mental Status: He is alert.     Comments: Patient unable to move,  bilateral upper extremity and lower extremity flaccid paralysis  Patient is able to mouth some words which are difficult to understand, he does follow commands lifts eyebrows when asked  Psychiatric:        Mood and Affect: Mood normal.        Behavior: Behavior normal.     (all labs ordered are listed, but only abnormal results are displayed) Labs Reviewed  COMPREHENSIVE METABOLIC PANEL WITH GFR - Abnormal; Notable for the following components:      Result Value   Sodium 134 (*)    Chloride 93 (*)    Glucose, Bld 174 (*)    Creatinine, Ser <  0.30 (*)    Total Protein 9.0 (*)    Alkaline Phosphatase 149 (*)    All other components within normal limits  CBC - Abnormal; Notable for the following components:   WBC 23.2 (*)    All other components within normal limits  PRO BRAIN NATRIURETIC PEPTIDE - Abnormal; Notable for the following components:   Pro Brain Natriuretic Peptide 413.0 (*)    All other components within normal limits  RESP PANEL BY RT-PCR (RSV, FLU A&B, COVID)  RVPGX2  CULTURE, BLOOD (ROUTINE X 2)  CULTURE, BLOOD (ROUTINE X 2)  GASTROINTESTINAL PANEL BY PCR, STOOL (REPLACES STOOL CULTURE)  C DIFFICILE QUICK SCREEN W PCR REFLEX    MRSA NEXT GEN BY PCR, NASAL  URINALYSIS, ROUTINE W REFLEX MICROSCOPIC  I-STAT CG4 LACTIC ACID, ED    EKG: EKG Interpretation Date/Time:  Friday November 14 2024 10:03:36 EST Ventricular Rate:  91 PR Interval:  160 QRS Duration:  95 QT Interval:  357 QTC Calculation: 440 R Axis:   0  Text Interpretation: Sinus rhythm Confirmed by Neysa Clap [phone]) on 11/14/2024 11:06:04 AM  Radiology: CT Head Wo Contrast Result Date: 11/14/2024 EXAM: CT HEAD WITHOUT CONTRAST 11/14/2024 11:13:33 AM TECHNIQUE: CT of the head was performed without the administration of intravenous contrast. Automated exposure control, iterative reconstruction, and/or weight based adjustment of the mA/kV was utilized to reduce the radiation dose to as low as  reasonably achievable. COMPARISON: CT head 06/15/2022. CLINICAL HISTORY: Delirium. FINDINGS: BRAIN AND VENTRICLES: No acute hemorrhage. No evidence of acute infarct. No hydrocephalus. No extra-axial collection. No mass effect or midline shift. ORBITS: No acute abnormality. SINUSES: No acute abnormality. SOFT TISSUES AND SKULL: No acute soft tissue abnormality. No skull fracture. IMPRESSION: 1. No acute intracranial abnormality. Electronically signed by: Ryan Chess MD 11/14/2024 11:18 AM EST RP Workstation: [id number]   DG Chest Port 1 View Result Date: 11/14/2024 CLINICAL DATA:  Shortness of breath.  Ventilator dependence. EXAM: PORTABLE CHEST 1 VIEW COMPARISON:  07/04/2024 FINDINGS: Tracheostomy tube remains in place. The cardio pericardial silhouette is enlarged. Vascular congestion with patchy and nodular airspace disease bilaterally. Bilateral pleural effusions evident. Telemetry leads overlie the chest. IMPRESSION: 1. Vascular congestion with patchy and nodular airspace disease bilaterally. Findings may reflect infectious/inflammatory etiology but close follow-up warranted. 2. Bilateral pleural effusions. Electronically Signed   By: Camellia Candle M.D.   On: 11/14/2024 07:24     .Critical Care  Performed by: Neldon Hamp RAMAN, PA Authorized by: Neldon Hamp RAMAN, PA   Critical care provider statement:    Critical care time (minutes):  35   Critical care time was exclusive of:  Separately billable procedures and treating other patients and teaching time   Critical care was necessary to treat or prevent imminent or life-threatening deterioration of the following conditions:  Respiratory failure   Critical care was time spent personally by me on the following activities:  Development of treatment plan with patient or surrogate, review of old charts, re-evaluation of patient's condition, pulse oximetry, ordering and review of radiographic studies, ordering and review of laboratory studies, ordering  and performing treatments and interventions, obtaining history from patient or surrogate, examination of patient and evaluation of patient's response to treatment   Care discussed with: admitting provider      Medications Ordered in the ED  piperacillin -tazobactam (ZOSYN ) IVPB 3.375 g (0 g Intravenous Stopped 11/14/24 1017)    Clinical Course as of 11/14/24 1238  Fri Nov 14, 2024  0944 O2  sats  dropping, more confused and somnolent [WF]  1142 [id number]  [WF]  1143 Discussed with Ronnald Gave of critical care who will come evaluate patient she recommends hospitalist admission and critical care to follow and management of vent  [WF]    Clinical Course User Index [WF] Neldon Hamp RAMAN, PA                                 Medical Decision Making Amount and/or Complexity of Data Reviewed Labs: ordered. Radiology: ordered.  Risk Prescription drug management. Decision regarding hospitalization.   This patient presents to the ED for concern of AMS, this involves a number of treatment options, and is a complaint that carries with it a moderate to high risk of complications and morbidity. A differential diagnosis was considered for the patient's symptoms which is discussed below:   The differential diagnosis for AMS is extensive and includes, but is not limited to: drug overdose - opioids, alcohol , sedatives, antipsychotics, drug withdrawal, others; Metabolic: hypoxia, hypoglycemia, hyperglycemia, hypercalcemia, hypernatremia, hyponatremia, uremia, hepatic encephalopathy, hypothyroidism, hyperthyroidism, vitamin B12 or thiamine deficiency, carbon monoxide poisoning, Wilson's disease, Lactic acidosis, DKA/HHOS; Infectious: meningitis, encephalitis, bacteremia/sepsis, urinary tract infection, pneumonia, neurosyphilis; Structural: Space-occupying lesion, (brain tumor, subdural hematoma, hydrocephalus,); Vascular: stroke, subarachnoid hemorrhage, coronary ischemia, hypertensive encephalopathy, CNS  vasculitis, thrombotic thrombocytopenic purpura, disseminated intravascular coagulation, hyperviscosity; Psychiatric: Schizophrenia, depression; Other: Seizure, hypothermia, heat stroke, dementia    Co morbidities: Discussed in HPI   Brief History:  Patient is a 74 year old male altered from baseline more confused and somnolent than usual chronic ventilator via trach resides at [facility].  Found to have leukocytosis and admitted for ventilator associated pneumonia.    EMR reviewed including pt PMHx, past surgical history and past visits to ER.   See HPI for more details   Lab Tests:  Leukocytosis of 23.2, CMP with mild hyponatremia BNP 413   Imaging Studies:  Abnormal findings. I personally reviewed all imaging studies. Imaging notable for Concerning for pneumonia versus pulmonary edema   Cardiac Monitoring:  The patient was maintained on a cardiac monitor.  I personally viewed and interpreted the cardiac monitored which showed an underlying rhythm of: NSR EKG non-ischemic   Medicines ordered:  I ordered medication including Zosyn  for VAP Reevaluation of the patient after these medicines showed that the patient stayed the same I have reviewed the patients home medicines and have made adjustments as needed   Critical Interventions:   admission   Consults/Attending Physician  I discussed this case with my attending physician who cosigned this note including patient's presenting symptoms, physical exam, and planned diagnostics and interventions. Attending physician stated agreement with plan or made changes to plan which were implemented.   Attending physician assessed patient at bedside.   Discussed with hospitalist and ICU.  Patient will be admitted over Darryle Law in the ICU managed by hospitalist with PCCM consulting for ventilator management.   Reevaluation:  After the interventions noted above I re-evaluated patient and found that they have :stayed the  same   Social Determinants of Health:      Problem List / ED Course:  Admitted for ventilator assisted pneumonia   Dispostion:  After consideration of the diagnostic results and the patients response to treatment, I feel that the patent would benefit from admission   Final diagnoses:  Pneumonia due to infectious organism, unspecified laterality, unspecified part of lung    ED Discharge Orders  None          Neldon Hamp RAMAN, [place] 11/14/24 1309    Neysa Caron PARAS, DO 11/14/24 1536  "

## 2024-11-14 NOTE — Consult Note (Signed)
 "  NAME:  Andrew Rivera, MRN:  [id number], DOB:  02-10-1951, LOS: 0 ADMISSION DATE:  11/14/2024, CONSULTATION DATE:  11/14/24 REFERRING MD:  Neldon - EM , CHIEF COMPLAINT:  AMS    History of Present Illness:  74 yo M [facility] resident with complex PMH -- trach/vent dependence, quadriplegia, HTN, who presented to [facility] 11/14/24 with AMS. Sounds like at [facility] the last couple of days he had wet lung sounds and a CXR may have shown some pulm edema so was diuresed. He felt poorly day of presentation but does not have any specific complaints for me. Wife feels he is more lethargic than usual. ED workup w ASD -- PNA +/- edema, and bilateral pleural effusions   Patient is planned for admission for sepsis 2/2 PNA  He has a hx of MDRO  PCCM is consulted for trach vent    Pertinent  Medical History  Trach vent  Quad  MDRO PNAs   Significant Hospital Events: Including procedures, antibiotic start and stop dates in addition to other pertinent events     Interim History / Subjective:  Rcvd zosyn  x 1   Objective    Blood pressure (!) 148/55, pulse 92, temperature 98.8 F (37.1 C), temperature source Oral, resp. rate (!) 22, height 5' 9 (1.753 m), weight 90 kg, SpO2 100%.    Vent Mode: PCV FiO2 (%):  [40 %-50 %] 40 % Set Rate:  [22 bmp] 22 bmp PEEP:  [5 cmH20] 5 cmH20 Plateau Pressure:  [18 cmH20-19 cmH20] 19 cmH20  No intake or output data in the 24 hours ending 11/14/24 1220 Filed Weights   11/14/24 0623  Weight: 90 kg    Examination: General: chronically critically ill M NAD  HENT: 6 shiley pxlt secure anicteric sclera chapped lips  Lungs: mechanically ventilated. Diminished at bases, some scattered rhonchi  Cardiovascular: rr s1s2 Abdomen: PEG. Soft round abdomen  Extremities: decr muscle mass and tone  Neuro: awake, mouthing words to communicate Skin: flushed   Resolved problem list   Assessment and Plan   Chronic resp failure Trach/vent dependence HCAP Bilateral pleural  effusions Possible pulm edema Pas cx data 12/2023: Urine cultures grew ESBL E. coli.  Tracheal aspirate grew ESBL E. coli and Providencia rettgeri(CRE-NDM) and Pseudomonas aeruginosa. Tx bactrim  mero.   P -admit to ICU, TRH as primary  -stable vent settings from [facility], continue without weaning. He is 24/7 vent -routine trach care -send trach asp -rcvd 1x zosyn  in ED -- based on prior resp cx, would rec mero   -check MRSA PCR  - looks like primary is ordering an ECHO, his BNP is high -- could argue for diuresis to help w his effusions but in context of sepsis 2/2 PNA also could make an argument to tap. Would eval w US  in the morning, follow trach asp and echo     Sepsis 2/2 HCAP  Acute encephalopathy 2/2 sepsis - improving HTN Anxiety, depression Quadriplegia -per primary, but my two cents:  -As discussed above would rec mero.  -UA and Bcx have already been ordered  - depending on what he grows, ID consult might be warranted. Might be premature right now   Labs   CBC: Recent Labs  Lab 11/14/24 0737  WBC 23.2*  HGB 13.3  HCT 42.0  MCV 91.1  PLT 389    Basic Metabolic Panel: Recent Labs  Lab 11/14/24 0737  NA 134*  K 3.8  CL 93*  CO2 31  GLUCOSE 174*  BUN  12  CREATININE <0.30*  CALCIUM 9.5   GFR: CrCl cannot be calculated (This lab value cannot be used to calculate CrCl because it is not a number: <0.30). Recent Labs  Lab 11/14/24 0737 11/14/24 0909  WBC 23.2*  --   LATICACIDVEN  --  0.8    Liver Function Tests: Recent Labs  Lab 11/14/24 0737  AST 21  ALT 16  ALKPHOS 149*  BILITOT 0.6  PROT 9.0*  ALBUMIN 3.6   No results for input(s): LIPASE, AMYLASE in the last 168 hours. No results for input(s): AMMONIA in the last 168 hours.  ABG    Component Value Date/Time   PHART 7.356 06/15/2022 1514   PCO2ART 57.1 (H) 06/15/2022 1514   PO2ART 161 (H) 06/15/2022 1514   HCO3 31.9 (H) 06/15/2022 1514   TCO2 34 (H) 06/15/2022 1514   O2SAT 99  06/15/2022 1514     Coagulation Profile: No results for input(s): INR, PROTIME in the last 168 hours.  Cardiac Enzymes: No results for input(s): CKTOTAL, CKMB, CKMBINDEX, TROPONINI in the last 168 hours.  HbA1C: Hgb A1c MFr Bld  Date/Time Value Ref Range Status  12/28/2023 03:50 PM 4.8 4.8 - 5.6 % Final    Comment:    (NOTE) Pre diabetes:          5.7%-6.4%  Diabetes:              >6.4%  Glycemic control for   <7.0% adults with diabetes   06/19/2022 12:59 AM 5.3 4.8 - 5.6 % Final    Comment:    (NOTE) Pre diabetes:          5.7%-6.4%  Diabetes:              >6.4%  Glycemic control for   <7.0% adults with diabetes     CBG: No results for input(s): GLUCAP in the last 168 hours.  Review of Systems:   Limited from communication barrier Review of Systems  Constitutional:  Positive for malaise/fatigue.  HENT: Negative.    Respiratory:  Negative for cough, hemoptysis, sputum production and wheezing.   Cardiovascular: Negative.   Gastrointestinal:  Negative for abdominal pain and constipation.  Skin:  Negative for rash.  Neurological:  Positive for weakness.     Past Medical History:  He,  has a past medical history of Acute on chronic respiratory failure with hypoxia (HCC), Allergic rhinitis, Altered mental status, unspecified, Chronic pain syndrome, Critical illness myopathy, Depression, GAD (generalized anxiety disorder), Internal jugular (IJ) vein thromboembolism, acute, unspecified laterality (HCC), and Septic shock (HCC).   Surgical History:   Past Surgical History:  Procedure Laterality Date   CYSTOSCOPY WITH FULGERATION N/A 09/09/2023   Procedure: CYSTOSCOPY WITH FULGERATION; CLOT EVACUATION;  TUR;  Surgeon: Shona Layman BROCKS, MD;  Location: [facility];  Service: Urology;  Laterality: N/A;   IR REPLC GASTRO/COLONIC TUBE PERCUT W/FLUORO  07/05/2021     Social History:   reports that he quit smoking about 34 years ago. His smoking use included  cigarettes. He smoked an average of 1 pack per day. He has never used smokeless tobacco. He reports that he does not currently use alcohol . He reports that he does not use drugs.   Family History:  His family history is not on file.   Allergies Allergies[1]   Home Medications  Prior to Admission medications  Medication Sig Start Date End Date Taking? Authorizing Provider  amLODipine  (NORVASC ) 10 MG tablet Place 1 tablet (10 mg total) into feeding  tube daily. 09/13/23  Yes Gonfa, Taye T, MD  Carboxymethylcellulose Sodium 1 % GEL Place 1 drop into both eyes daily.   Yes [provider]  DULoxetine  (CYMBALTA ) 30 MG capsule 30 mg 2 (two) times daily. Per tube   Yes [provider]  esomeprazole (NEXIUM) 40 MG packet 40 mg daily. Per tube   Yes [provider]  fentaNYL  (DURAGESIC ) 25 MCG/HR Place 1 patch onto the skin every 3 (three) days. 01/08/24  Yes Samtani, Jai-Gurmukh, MD  furosemide  (LASIX ) 20 MG tablet Take 20 mg by mouth daily.   Yes [provider]  guaiFENesin  (ROBITUSSIN) 100 MG/5ML liquid Place 10 mLs into feeding tube 3 (three) times daily. Patient taking differently: Place 10 mLs into feeding tube daily. 01/03/24  Yes Samtani, Jai-Gurmukh, MD  HYDROmorphone  (DILAUDID ) 2 MG tablet Place 0.5 tablets (1 mg total) into feeding tube every 8 (eight) hours as needed for severe pain (pain score 7-10). 01/08/24  Yes Samtani, Jai-Gurmukh, MD  ipratropium-albuterol  (DUONEB) 0.5-2.5 (3) MG/3ML SOLN Take 3 mLs by nebulization every 6 (six) hours.   Yes [provider]  levocetirizine (XYZAL ) 5 MG tablet Place 5 mg into feeding tube every evening.   Yes [provider]  loperamide (IMODIUM A-D) 2 MG tablet Take 2 mg by mouth every 6 (six) hours as needed for diarrhea or loose stools.   Yes [provider]  Melatonin 10 MG TABS Give 10 mg by tube at bedtime.   Yes [provider]  ondansetron  (ZOFRAN -ODT) 4 MG disintegrating  tablet Take 4 mg by mouth every 8 (eight) hours as needed for nausea or vomiting.   Yes [provider]  polyethylene glycol (MIRALAX  / GLYCOLAX ) 17 g packet Place 17 g into feeding tube 2 (two) times daily. Patient taking differently: Place 17 g into feeding tube daily as needed for moderate constipation. 06/09/21  Yes Alto Isaiah CROME, NP  pregabalin  (LYRICA ) 100 MG capsule Place 1 capsule (100 mg total) into feeding tube 3 (three) times daily. 01/08/24  Yes Samtani, Jai-Gurmukh, MD  Probiotic Product (PROBIOTIC PO) Place 1 capsule into feeding tube daily.   Yes [provider]  Protein (FEEDING SUPPLEMENT, PROSOURCE TF20,) liquid Place 60 mLs into feeding tube daily. 06/21/22  Yes Mapp, Tavien, MD  scopolamine  (TRANSDERM-SCOP) 1 MG/3DAYS Place 1 patch onto the skin every 3 (three) days.   Yes [provider]  acetaminophen  (TYLENOL ) 325 MG tablet Place 975 mg into feeding tube in the morning and at bedtime.    [provider]  LORazepam  (ATIVAN ) 0.5 MG tablet Place 1 tablet (0.5 mg total) into feeding tube every 12 (twelve) hours as needed for anxiety. 01/08/24   Samtani, Jai-Gurmukh, MD  Nutritional Supplements (FEEDING SUPPLEMENT, OSMOLITE 1.5 CAL,) LIQD Place 50 mL/hr into feeding tube continuous. 09/12/23   Gonfa, Taye T, MD  pantoprazole  sodium (PROTONIX ) 40 mg Place 40 mg into feeding tube daily. 09/12/23   [provider]  Water  For Irrigation, Sterile (FREE WATER ) SOLN Place 300 mLs into feeding tube every 4 (four) hours. 01/08/24   Samtani, Jai-Gurmukh, MD  sertraline (ZOLOFT) 50 MG tablet Place 50 mg into feeding tube at bedtime.  04/28/21  [provider]     Critical care time: na       Ronnald Gave MSN, AGACNP-BC [facility] Amion for pager 11/14/2024, 1:26 PM          [1]  Allergies Allergen Reactions   Chlorhexidine  Itching and Rash  Codeine Nausea Only   Oxycodone-Acetaminophen  Nausea  Only    Other reaction(s): Hallucinations   "

## 2024-11-14 NOTE — ED Notes (Signed)
 Provider informed that only one set of blood cultures were collected, due to patient being a difficult stick. Antibiotics to be administered

## 2024-11-14 NOTE — Progress Notes (Signed)
 Pt transported on vent from ED to CT and back w/o complication.

## 2024-11-15 DIAGNOSIS — J15212 Pneumonia due to Methicillin resistant Staphylococcus aureus: Secondary | ICD-10-CM | POA: Diagnosis not present

## 2024-11-15 DIAGNOSIS — Z930 Tracheostomy status: Secondary | ICD-10-CM

## 2024-11-15 DIAGNOSIS — G9340 Encephalopathy, unspecified: Secondary | ICD-10-CM

## 2024-11-15 DIAGNOSIS — A419 Sepsis, unspecified organism: Secondary | ICD-10-CM | POA: Diagnosis not present

## 2024-11-15 DIAGNOSIS — J9610 Chronic respiratory failure, unspecified whether with hypoxia or hypercapnia: Secondary | ICD-10-CM

## 2024-11-15 DIAGNOSIS — Z9911 Dependence on respirator [ventilator] status: Secondary | ICD-10-CM

## 2024-11-15 DIAGNOSIS — J188 Other pneumonia, unspecified organism: Secondary | ICD-10-CM | POA: Diagnosis not present

## 2024-11-15 LAB — BASIC METABOLIC PANEL WITH GFR
Anion gap: 7 (ref 5–15)
BUN: 17 mg/dL (ref 8–23)
CO2: 33 mmol/L — ABNORMAL HIGH (ref 22–32)
Calcium: 9 mg/dL (ref 8.9–10.3)
Chloride: 98 mmol/L (ref 98–111)
Creatinine, Ser: <0.3 mg/dL — ABNORMAL LOW (ref 0.61–1.24)
Glucose, Bld: 139 mg/dL — ABNORMAL HIGH (ref 70–99)
Potassium: 3.4 mmol/L — ABNORMAL LOW (ref 3.5–5.1)
Sodium: 138 mmol/L (ref 135–145)

## 2024-11-15 LAB — CBC
HCT: 34.7 % — ABNORMAL LOW (ref 39.0–52.0)
Hemoglobin: 11.1 g/dL — ABNORMAL LOW (ref 13.0–17.0)
MCH: 28.6 pg (ref 26.0–34.0)
MCHC: 32 g/dL (ref 30.0–36.0)
MCV: 89.4 fL (ref 80.0–100.0)
Platelets: 303 10*3/uL (ref 150–400)
RBC: 3.88 MIL/uL — ABNORMAL LOW (ref 4.22–5.81)
RDW: 14.8 % (ref 11.5–15.5)
WBC: 12.7 10*3/uL — ABNORMAL HIGH (ref 4.0–10.5)
nRBC: 0 % (ref 0.0–0.2)

## 2024-11-15 LAB — GLUCOSE, CAPILLARY: Glucose-Capillary: 91 mg/dL (ref 70–99)

## 2024-11-15 LAB — MRSA NEXT GEN BY PCR, NASAL: MRSA by PCR Next Gen: DETECTED — AB

## 2024-11-15 LAB — C DIFFICILE QUICK SCREEN W PCR REFLEX
C Diff antigen: NEGATIVE
C Diff interpretation: NOT DETECTED
C Diff toxin: NEGATIVE

## 2024-11-15 LAB — MAGNESIUM: Magnesium: 2.4 mg/dL (ref 1.7–2.4)

## 2024-11-15 MED ORDER — CARMEX CLASSIC LIP BALM EX OINT
1.0000 | TOPICAL_OINTMENT | CUTANEOUS | Status: DC | PRN
  Filled 2024-11-15 (×2): qty 10

## 2024-11-15 MED ORDER — VANCOMYCIN HCL 1250 MG/250ML IV SOLN
1250.0000 mg | Freq: Two times a day (BID) | INTRAVENOUS | Status: DC
  Administered 2024-11-15 – 2024-11-16 (×3): 1250 mg via INTRAVENOUS
  Filled 2024-11-15 (×4): qty 250

## 2024-11-15 MED ORDER — POTASSIUM CHLORIDE 10 MEQ/100ML IV SOLN
10.0000 meq | INTRAVENOUS | Status: AC
  Administered 2024-11-15 (×4): 10 meq via INTRAVENOUS
  Filled 2024-11-15 (×4): qty 100

## 2024-11-15 MED ORDER — VANCOMYCIN HCL 2000 MG/400ML IV SOLN
2000.0000 mg | Freq: Once | INTRAVENOUS | Status: AC
  Administered 2024-11-15: 2000 mg via INTRAVENOUS
  Filled 2024-11-15: qty 400

## 2024-11-15 NOTE — Progress Notes (Signed)
 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 Recently started on diuretics for fluid on his lungs at facility.  In ED, slightly tachycardic and tachypneic.  BP elevated to 194/57.  WBC 23.2 with left shift.  Lactic acid normal.  proBNP 413.  COVID-19, influenza and RSV PCR nonreactive.  CT head without acute finding.  CXR suggested vascular congestion with patchy nodular airspace disease bilaterally and bilateral pleural effusions.  CT chest without contrast concerning for multifocal pneumonia, probable aspirated material within the bronchi bilaterally, but most prominently seen in right mediastinal bronchus.  Cultures obtained.  Started on IV meropenem .  PCCM consulted.    The next day, encephalopathy resolved.  Blood cultures NGTD.  MRSA PCR screen positive.  Trach aspirate culture pending.  Vancomycin  added.  Subjective: Seen and examined earlier this morning.  No major events overnight or this morning.  Patient cannot vocalize.  Currently on vent via trach.  Responds no to shortness of breath.  Denies new pain.   Assessment and plan: Sepsis due to ventilator associated pneumonia: Heart tachycardia, tachypnea and leukocytosis.  Patient with chronic vent via trach.  Prior history of ESBL infection.  Seems to be stable from breathing standpoint.  CXR and CT  chest suggested multifocal pneumonia.  Blood cultures NGTD.  MRSA PCR screen positive.  Trach aspirate Gram stain with GPC and GNR.  Sepsis physiology improved. -Continue IV meropenem  given history of ESBL -Add vancomycin  given positive MRSA -Vent management per pulmonology -Pulmonary toilet - Follow cultures.   Acute toxic and septic encephalopathy: Encephalopathy could be due to multiple sedating medications and sepsis.  CT head without acute finding.  Seems to have resolved. -Reorientation and delirium precaution - Manage sepsis as above.   Chronic respiratory failure s/p vent/trach/PEG -CCM consulted for vent mgt; apprec eval/recs   Essential hypertension -PTA amlodipine  10mg  daily  Dysphagia with PEG dependence - Continue tube feed.  Chronic back pain -On fentanyl  patch, p.o. Dilaudid , Lyrica    Anxiety/depression/insomnia - Continue home Cymbalta , Ativan  and melatonin  Body mass index is 29.3 kg/m.         Pressure skin injury: Present on admission. Wound 11/14/24 1511 Pressure Injury Buttocks Right Stage 2 -  Partial thickness loss of dermis presenting as a shallow open injury with a red, pink wound bed without slough. (Active)     Wound 11/14/24 1511 Pressure Injury Pretibial Proximal;Right Unstageable - Full thickness tissue loss in which the base of the injury is covered by slough (yellow, tan, gray, green or brown) and/or eschar (tan, brown or black) in the wound bed. (Active)   DVT prophylaxis:  enoxaparin  (LOVENOX ) injection 40 mg Start: 11/14/24 2200  Code Status: Full code Family Communication: None at the bedside Level of care: ICU Status is: Inpatient Remains inpatient appropriate because: Sepsis due to ventilator associated pneumonia, and encephalopathy   Final disposition: Likely back to [facility]   55 minutes  with more than 50% spent in reviewing records, counseling patient/family and coordinating care.  Consultants:   PCCM  Procedures: None  Microbiology summarized: COVID-19, influenza and RSV PCR nonreactive MRSA PCR screen positive Blood cultures NGTD Trach aspirate Gram stain with GPC and GNR  Objective: Vitals:   11/15/24 0500 11/15/24 0600 11/15/24 0800 11/15/24 0815  BP: (!) 162/47 (!) 146/50 (!) 169/50   Pulse: 78 75 78   Resp: (!) 22 (!) 22 (!) 22   Temp:   (!) 97.5 F (36.4 C)   TempSrc:   Axillary   SpO2: 100% 99% 99% 100%  Weight:      Height:        Examination:  GENERAL: No apparent distress.  Nontoxic. HEENT: MMM.  Vision and hearing grossly intact.  NECK: On vent via trach. RESP:  No IWOB.  Fair aeration bilaterally. CVS:  RRR. Heart sounds normal.  ABD/GI/GU: BS+. Abd soft, NTND.  PEG tube in place. MSK/EXT: Quadriplegic. SKIN: no apparent skin lesion or wound NEURO: AA.  Seems to be fairly oriented.  Responds to questions by nodding.  Quadriplegia PSYCH: Calm. Normal affect.   Sch Meds:  Scheduled Meds:  amLODipine   10 mg Per NG tube Daily   artificial tears  1 drop Both Eyes Daily   DULoxetine   30 mg Oral BID   enoxaparin  (LOVENOX ) injection  40 mg Subcutaneous Q24H   feeding supplement (PROSource TF20)  60 mL Per Tube Daily   fentaNYL   1 patch Transdermal Q72H   free water   300 mL Per Tube Q4H   furosemide   20 mg Per Tube Daily   guaiFENesin   10 mL Per Tube TID   ipratropium-albuterol   3 mL Nebulization Q6H   loratadine   10 mg Per Tube Daily   melatonin  10 mg Per Tube QHS   mouth rinse  15 mL Mouth Rinse Q2H   pantoprazole  (PROTONIX ) IV  40 mg Intravenous Q24H   pregabalin   100 mg Per Tube TID   scopolamine   1 patch Transdermal Q72H   Continuous Infusions:  feeding supplement (OSMOLITE 1.5 CAL) 50 mL/hr (11/15/24 0436)   meropenem  (MERREM ) IV Stopped (11/15/24 9348)   potassium chloride  Stopped (11/15/24 0856)   vancomycin      vancomycin  2,000 mg (11/15/24 0759)   PRN Meds:.HYDROmorphone , LORazepam , ondansetron , mouth rinse, polyethylene  glycol  Antimicrobials: Anti-infectives (From admission, onward)    Start     Dose/Rate Route Frequency Ordered Stop   11/15/24 2000  vancomycin  (VANCOREADY) IVPB 1250 mg/250 mL        1,250 mg 166.7 mL/hr over 90 Minutes Intravenous Every 12 hours 11/15/24 0817     11/15/24 0815  vancomycin  (VANCOREADY) IVPB 2000 mg/400 mL        2,000 mg 200 mL/hr over 120 Minutes Intravenous  Once 11/15/24 0719     11/14/24 1400  meropenem  (MERREM ) 1 g in sodium chloride  0.9 % 100 mL IVPB        1 g 200 mL/hr over 30 Minutes Intravenous Every 8 hours 11/14/24 1324     11/14/24 0930  piperacillin -tazobactam (ZOSYN ) IVPB 3.375 g        3.375 g 100 mL/hr over 30 Minutes Intravenous  Once 11/14/24 0925 11/14/24 1017        I have personally reviewed the following labs and images: CBC: Recent Labs  Lab 11/14/24 0737 11/14/24 1600 11/15/24 0236  WBC 23.2* 20.7* 12.7*  HGB 13.3 13.1 11.1*  HCT 42.0 41.5 34.7*  MCV  91.1 90.6 89.4  PLT 389 357 303   BMP &GFR Recent Labs  Lab 11/14/24 0737 11/14/24 1600 11/15/24 0236  NA 134*  --  138  K 3.8  --  3.4*  CL 93*  --  98  CO2 31  --  33*  GLUCOSE 174*  --  139*  BUN 12  --  17  CREATININE <0.30* <0.30* <0.30*  CALCIUM 9.5  --  9.0  MG  --   --  2.4   CrCl cannot be calculated (This lab value cannot be used to calculate CrCl because it is not a number: <0.30). Liver & Pancreas: Recent Labs  Lab 11/14/24 0737  AST 21  ALT 16  ALKPHOS 149*  BILITOT 0.6  PROT 9.0*  ALBUMIN 3.6   No results for input(s): LIPASE, AMYLASE in the last 168 hours. No results for input(s): AMMONIA in the last 168 hours. Diabetic: No results for input(s): HGBA1C in the last 72 hours. No results for input(s): GLUCAP in the last 168 hours. Cardiac Enzymes: No results for input(s): CKTOTAL, CKMB, CKMBINDEX, TROPONINI in the last 168 hours. Recent Labs    11/14/24 0857  PROBNP 413.0*   Coagulation Profile: No results for input(s):  INR, PROTIME in the last 168 hours. Thyroid Function Tests: No results for input(s): TSH, T4TOTAL, FREET4, T3FREE, THYROIDAB in the last 72 hours. Lipid Profile: No results for input(s): CHOL, HDL, LDLCALC, TRIG, CHOLHDL, LDLDIRECT in the last 72 hours. Anemia Panel: No results for input(s): VITAMINB12, FOLATE, FERRITIN, TIBC, IRON, RETICCTPCT in the last 72 hours. Urine analysis:    Component Value Date/Time   COLORURINE YELLOW 12/27/2023 2310   APPEARANCEUR CLOUDY (A) 12/27/2023 2310   LABSPEC 1.008 12/27/2023 2310   PHURINE 5.0 12/27/2023 2310   GLUCOSEU NEGATIVE 12/27/2023 2310   HGBUR SMALL (A) 12/27/2023 2310   BILIRUBINUR NEGATIVE 12/27/2023 2310   KETONESUR NEGATIVE 12/27/2023 2310   PROTEINUR NEGATIVE 12/27/2023 2310   NITRITE POSITIVE (A) 12/27/2023 2310   LEUKOCYTESUR LARGE (A) 12/27/2023 2310   Sepsis Labs: Invalid input(s): PROCALCITONIN, LACTICIDVEN  Microbiology: Recent Results (from the past 240 hours)  Resp panel by RT-PCR (RSV, Flu A&B, Covid) Anterior Nasal Swab     Status: None   Collection Time: 11/14/24  7:38 AM   Specimen: Anterior Nasal Swab  Result Value Ref Range Status   SARS Coronavirus 2 by RT PCR NEGATIVE NEGATIVE Final   Influenza A by PCR NEGATIVE NEGATIVE Final   Influenza B by PCR NEGATIVE NEGATIVE Final    Comment: (NOTE) The Xpert Xpress SARS-CoV-2/FLU/RSV plus assay is intended as an aid in the diagnosis of influenza from Nasopharyngeal swab specimens and should not be used as a sole basis for treatment. Nasal washings and aspirates are unacceptable for Xpert Xpress SARS-CoV-2/FLU/RSV testing.  Fact Sheet for Patients: bloggercourse.com  Fact Sheet for Healthcare Providers: seriousbroker.it  This test is not yet approved or cleared by the [place]  FDA and has been authorized for detection and/or diagnosis of SARS-CoV-2 by FDA under an  Emergency Use Authorization (EUA). This EUA will remain in effect (meaning this test can be used) for the duration of the COVID-19 declaration under Section 564(b)(1) of the Act, 21 U.S.C. section 360bbb-3(b)(1), unless the authorization is terminated or revoked.     Resp Syncytial Virus by PCR NEGATIVE NEGATIVE Final    Comment: (NOTE) Fact Sheet for Patients: bloggercourse.com  Fact Sheet for Healthcare Providers: seriousbroker.it  This test is not yet approved or cleared by  the [place]  FDA and has been authorized for detection and/or diagnosis of SARS-CoV-2 by FDA under an Emergency Use Authorization (EUA). This EUA will remain in effect (meaning this test can be used) for the duration of the COVID-19 declaration under Section 564(b)(1) of the Act, 21 U.S.C. section 360bbb-3(b)(1), unless the authorization is terminated or revoked.  Performed at [facility], [address], [place], [place] [address]   Blood culture (routine x 2)     Status: None (Preliminary result)   Collection Time: 11/14/24  8:57 AM   Specimen: BLOOD  Result Value Ref Range Status   Specimen Description BLOOD SITE NOT SPECIFIED  Final   Special Requests   Final    BOTTLES DRAWN AEROBIC AND ANAEROBIC Blood Culture results may not be optimal due to an inadequate volume of blood received in culture bottles   Culture   Final    NO GROWTH < 24 HOURS Performed at [facility], [address], [place], [place] [address]    Report Status PENDING  Incomplete  MRSA Next Gen by PCR, Nasal     Status: Abnormal   Collection Time: 11/14/24  8:58 AM   Specimen: Nasal Mucosa; Nasal Swab  Result Value Ref Range Status   MRSA by PCR Next Gen DETECTED (A) NOT DETECTED Final    Comment: (NOTE) The GeneXpert MRSA Assay (FDA approved for NASAL specimens only), is one component of a comprehensive MRSA colonization surveillance program. It is not  intended to diagnose MRSA infection nor to guide or monitor treatment for MRSA infections. Test performance is not FDA approved in patients less than 26 years old. Performed at [facility], [address] [place], [place] [address]   Blood culture (routine x 2)     Status: None (Preliminary result)   Collection Time: 11/14/24  4:00 PM   Specimen: BLOOD RIGHT HAND  Result Value Ref Range Status   Specimen Description BLOOD RIGHT HAND  Final   Special Requests   Final    BOTTLES DRAWN AEROBIC ONLY Blood Culture adequate volume   Culture   Final    NO GROWTH < 24 HOURS Performed at [facility], [address], [place], [place] [address]    Report Status PENDING  Incomplete  MRSA Next Gen by PCR, Nasal     Status: Abnormal   Collection Time: 11/14/24  4:22 PM   Specimen: Nasal Mucosa; Nasal Swab  Result Value Ref Range Status   MRSA by PCR Next Gen DETECTED (A) NOT DETECTED Final    Comment: RESULT CALLED TO, READ BACK BY AND VERIFIED WITH: DOROTHA BATTIEST, RN [phone] 11/15/24 BY VIN (NOTE) The GeneXpert MRSA Assay (FDA approved for NASAL specimens only), is one component of a comprehensive MRSA colonization surveillance program. It is not intended to diagnose MRSA infection nor to guide or monitor treatment for MRSA infections. Test performance is not FDA approved in patients less than 66 years old. Performed at [facility], [address], [place], [place] [address]   Culture, Respiratory w Gram Stain     Status: None (Preliminary result)   Collection Time: 11/14/24  4:47 PM   Specimen: Tracheal Aspirate; Respiratory  Result Value Ref Range Status   Specimen Description   Final    TRACHEAL ASPIRATE Performed at [facility], [address] [place], [place] [address]    Special Requests   Final    NONE Performed at [facility], [address] [place], [place] [address]    Gram Stain   Final    FEW WBC  PRESENT, PREDOMINANTLY PMN FEW GRAM POSITIVE COCCI RARE GRAM NEGATIVE RODS Performed at [facility], [address], [place], [place] [address]    Culture PENDING  Incomplete   Report Status PENDING  Incomplete    Radiology Studies: CT CHEST WO CONTRAST Result Date: 11/14/2024 CLINICAL DATA:  Shortness of breath EXAM: CT CHEST WITHOUT CONTRAST TECHNIQUE: Multidetector CT imaging of the chest was performed following the standard protocol without IV contrast. RADIATION DOSE REDUCTION: This exam was performed according to the departmental dose-optimization program which includes automated exposure control, adjustment of the mA and/or kV according to patient size and/or use of iterative reconstruction technique. COMPARISON:  July 04, 2024 FINDINGS: Cardiovascular: Aortic atherosclerosis without aneurysm formation. Mild cardiomegaly is noted. No pericardial effusion. Coronary artery calcifications are noted. Mediastinum/Nodes: Tracheostomy is in good position. No adenopathy. Thyroid gland is unremarkable. Esophagus is unremarkable. Lungs/Pleura: No pneumothorax is noted. Bilateral lower lobe airspace opacities are noted concerning for pneumonia, right greater than left. Patchy airspace opacities are noted in left upper lobe concerning for multifocal pneumonia. Minimal right middle lobe subsegmental atelectasis is noted. There is again noted probable aspirated material within the bronchi bilaterally, but most prominently seen in right mainstem bronchus. Upper Abdomen: No acute abnormality. Musculoskeletal: No chest wall mass or suspicious bone lesions identified. IMPRESSION: 1. Bilateral lower lobe airspace opacities are noted concerning for pneumonia, right greater than left. Patchy airspace opacities are noted in left upper lobe concerning for multifocal pneumonia. 2. There is again noted probable aspirated material within the bronchi bilaterally, but most prominently seen in right mainstem  bronchus. 3. Coronary artery calcifications are noted. 4. Aortic atherosclerosis. Aortic Atherosclerosis (ICD10-I70.0). Electronically Signed   By: Lynwood Landy Raddle M.D.   On: 11/14/2024 14:49   CT Head Wo Contrast Result Date: 11/14/2024 EXAM: CT HEAD WITHOUT CONTRAST 11/14/2024 11:13:33 AM TECHNIQUE: CT of the head was performed without the administration of intravenous contrast. Automated exposure control, iterative reconstruction, and/or weight based adjustment of the mA/kV was utilized to reduce the radiation dose to as low as reasonably achievable. COMPARISON: CT head 06/15/2022. CLINICAL HISTORY: Delirium. FINDINGS: BRAIN AND VENTRICLES: No acute hemorrhage. No evidence of acute infarct. No hydrocephalus. No extra-axial collection. No mass effect or midline shift. ORBITS: No acute abnormality. SINUSES: No acute abnormality. SOFT TISSUES AND SKULL: No acute soft tissue abnormality. No skull fracture. IMPRESSION: 1. No acute intracranial abnormality. Electronically signed by: Ryan Chess MD 11/14/2024 11:18 AM EST RP Workstation: [id number]      Annet Manukyan T. Hale Chalfin [facility]  If 7PM-7AM, please contact night-coverage [email or web address] 11/15/2024, 9:53 AM   "

## 2024-11-15 NOTE — Progress Notes (Signed)
 Pharmacy Antibiotic Note  Andrew Rivera is a 74 y.o. male admitted on 11/14/2024 with pneumonia.  Pharmacy has been consulted for vancomycin  dosing.  Plan: Vancomycin  2 gm IV x 1 followed by vancomycin  1250 mg IV q12 hrs for eAUC 532.6 using SCr rounded up to 0.8, Vd 0.73, Css min 15.2 Continue Meropenem  1 gm IV q8 hrs  Height: 5' 9 (175.3 cm) Weight: 90 kg (198 lb 6.6 oz) IBW/kg (Calculated) : 70.7  Temp (24hrs), Avg:98.5 F (36.9 C), Min:98.3 F (36.8 C), Max:98.8 F (37.1 C)  Recent Labs  Lab 11/14/24 0737 11/14/24 0909 11/14/24 1600 11/15/24 0236  WBC 23.2*  --  20.7* 12.7*  CREATININE <0.30*  --  <0.30* <0.30*  LATICACIDVEN  --  0.8  --   --     CrCl cannot be calculated (This lab value cannot be used to calculate CrCl because it is not a number: <0.30).    Allergies[1]   1/23 Mero>>  1/24 Vanc>> 1/23 zosyn  x 1  1/22 trach asp: few GPC, rare GNR 1/23 MRSA: + 1/23 BCx2:  1/23 C diff: -previously grew providencia, pseudomonas, ESBL ecoli (12/2023) - s zosyn   Thank you for allowing pharmacy to be a part of this patients care.  Rosaline IVAR Edison, Pharm.D Use secure chat for questions 11/15/2024 8:21 AM     [1]  Allergies Allergen Reactions   Chlorhexidine  Itching and Rash   Codeine Nausea Only   Oxycodone-Acetaminophen  Nausea Only    Other reaction(s): Hallucinations

## 2024-11-15 NOTE — Plan of Care (Incomplete)

## 2024-11-15 NOTE — Progress Notes (Signed)
" ° °  NAME:  Andrew Rivera, MRN:  [id number], DOB:  1951/08/02, LOS: 1 ADMISSION DATE:  11/14/2024, CONSULTATION DATE: 11/14/2024 REFERRING MD: Dr. Neldon, CHIEF COMPLAINT: Altered mental status  History of Present Illness:  74 yo M [facility] resident with complex PMH -- trach/vent dependence, quadriplegia, HTN, who presented to [facility]/14/24 with AMS. Sounds like at [facility] the last couple of days he had wet lung sounds and a CXR may have shown some pulm edema so was diuresed. He felt poorly day of presentation but does not have any specific complaints for me. Wife feels he is more lethargic than usual. ED workup w ASD -- PNA +/- edema, and bilateral pleural effusions   Patient is planned for admission for sepsis 2/2 PNA  He has a hx of MDRO   PCCM is consulted for trach vent   Pertinent  Medical History  Chronic trach, vent Functional quadriplegia MDRO pneumonias  Significant Hospital Events: Including procedures, antibiotic start and stop dates in addition to other pertinent events   1/23-admitted, CCM consult.  CT chest-bibasal infiltrative process  Interim History / Subjective:  No overnight events, remains on vent  Objective    Blood pressure (!) 169/50, pulse 78, temperature (!) 97.5 F (36.4 C), temperature source Axillary, resp. rate (!) 22, height 5' 9 (1.753 m), weight 90 kg, SpO2 100%.    Vent Mode: PCV FiO2 (%):  [40 %] 40 % Set Rate:  [22 bmp] 22 bmp PEEP:  [5 cmH20] 5 cmH20 Plateau Pressure:  [18 cmH20-21 cmH20] 20 cmH20   Intake/Output Summary (Last 24 hours) at 11/15/2024 0850 Last data filed at 11/15/2024 0800 Gross per 24 hour  Intake 2300 ml  Output --  Net 2300 ml   Filed Weights   11/14/24 9376  Weight: 90 kg    Examination: General: Elderly, does not appear to be in acute distress HENT: Tracheostomy site looks fair Lungs: Fair air entry bilaterally Cardiovascular: S1-S2 appreciated Abdomen: Soft, bowel sounds appreciated, PEG in place Extremities:  Decreased muscle mass/tone Neuro: Awake, GU:   I reviewed last 24 h vitals and pain scores, last 48 h intake and output, last 24 h labs and trends, and last 24 h imaging results.  BUN 17, creatinine 0.3 WBC 20.7, improved to 12.7  Resolved problem list   Assessment and Plan   Chronic respiratory failure Tracheostomy/ventilator dependence - Continue vent management-he is 24/7 vent-no weaning - On pressure control ventilation, pressure control 20 above PEEP of 5 - No changes been made to vent settings  Multilobar pneumonia HCAP -On meropenem  - Cultures pending - Tracheal aspirate showing few gram-positive cocci, rare gram-negative rods -MRSA PCR positive  Sepsis -Continue antibiotics - Follow cultures  Concern for pleural effusion - No significant effusion on CT scan  Continue antibiotic therapy Pulmonary toileting  Jennet Epley, MD [facility] Pager: See Amion     "

## 2024-11-16 DIAGNOSIS — J15212 Pneumonia due to Methicillin resistant Staphylococcus aureus: Secondary | ICD-10-CM | POA: Diagnosis not present

## 2024-11-16 DIAGNOSIS — G9340 Encephalopathy, unspecified: Secondary | ICD-10-CM | POA: Diagnosis not present

## 2024-11-16 DIAGNOSIS — J9610 Chronic respiratory failure, unspecified whether with hypoxia or hypercapnia: Secondary | ICD-10-CM | POA: Diagnosis not present

## 2024-11-16 DIAGNOSIS — A419 Sepsis, unspecified organism: Secondary | ICD-10-CM | POA: Diagnosis not present

## 2024-11-16 DIAGNOSIS — J188 Other pneumonia, unspecified organism: Secondary | ICD-10-CM | POA: Diagnosis not present

## 2024-11-16 LAB — RENAL FUNCTION PANEL
Albumin: 3.1 g/dL — ABNORMAL LOW (ref 3.5–5.0)
Anion gap: 6 (ref 5–15)
BUN: 16 mg/dL (ref 8–23)
CO2: 33 mmol/L — ABNORMAL HIGH (ref 22–32)
Calcium: 8.8 mg/dL — ABNORMAL LOW (ref 8.9–10.3)
Chloride: 101 mmol/L (ref 98–111)
Creatinine, Ser: <0.3 mg/dL — ABNORMAL LOW (ref 0.61–1.24)
Glucose, Bld: 158 mg/dL — ABNORMAL HIGH (ref 70–99)
Phosphorus: 2.7 mg/dL (ref 2.5–4.6)
Potassium: 3.8 mmol/L (ref 3.5–5.1)
Sodium: 140 mmol/L (ref 135–145)

## 2024-11-16 LAB — GASTROINTESTINAL PANEL BY PCR, STOOL (REPLACES STOOL CULTURE)

## 2024-11-16 LAB — CBC
HCT: 36.5 % — ABNORMAL LOW (ref 39.0–52.0)
Hemoglobin: 11.3 g/dL — ABNORMAL LOW (ref 13.0–17.0)
MCH: 28 pg (ref 26.0–34.0)
MCHC: 31 g/dL (ref 30.0–36.0)
MCV: 90.6 fL (ref 80.0–100.0)
Platelets: 325 10*3/uL (ref 150–400)
RBC: 4.03 MIL/uL — ABNORMAL LOW (ref 4.22–5.81)
RDW: 15.3 % (ref 11.5–15.5)
WBC: 12.1 10*3/uL — ABNORMAL HIGH (ref 4.0–10.5)
nRBC: 0 % (ref 0.0–0.2)

## 2024-11-16 LAB — MAGNESIUM: Magnesium: 2.4 mg/dL (ref 1.7–2.4)

## 2024-11-16 MED ORDER — LOSARTAN POTASSIUM 50 MG PO TABS
50.0000 mg | ORAL_TABLET | Freq: Every day | ORAL | Status: DC
  Administered 2024-11-16: 50 mg
  Filled 2024-11-16 (×2): qty 1

## 2024-11-16 MED ORDER — COLLAGENASE 250 UNIT/GM EX OINT
1.0000 | TOPICAL_OINTMENT | Freq: Every day | CUTANEOUS | Status: DC
  Administered 2024-11-16 – 2024-11-19 (×4): 1 via TOPICAL
  Filled 2024-11-16: qty 30

## 2024-11-16 MED ORDER — ACETAMINOPHEN 160 MG/5ML PO SOLN
650.0000 mg | Freq: Four times a day (QID) | ORAL | Status: DC | PRN
  Administered 2024-11-16: 650 mg via ORAL
  Filled 2024-11-16: qty 20.3

## 2024-11-16 MED ORDER — LABETALOL HCL 5 MG/ML IV SOLN
10.0000 mg | INTRAVENOUS | Status: DC | PRN
  Administered 2024-11-16: 10 mg via INTRAVENOUS
  Filled 2024-11-16: qty 4

## 2024-11-16 MED ORDER — HYDRALAZINE HCL 25 MG PO TABS: 25.0000 mg | ORAL_TABLET | ORAL | Status: DC | PRN

## 2024-11-16 NOTE — Progress Notes (Signed)
 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 Recently started on diuretics for fluid on his lungs at facility.  In ED, slightly tachycardic and tachypneic.  BP elevated to 194/57.  WBC 23.2 with left shift.  Lactic acid normal.  proBNP 413.  COVID-19, influenza and RSV PCR nonreactive.  CT head without acute finding.  CXR suggested vascular congestion with patchy nodular airspace disease bilaterally and bilateral pleural effusions.  CT chest without contrast concerning for multifocal pneumonia, probable aspirated material within the bronchi bilaterally, but most prominently seen in right mediastinal bronchus.  Cultures obtained.  Started on IV meropenem .  PCCM consulted.    The next day, encephalopathy resolved.  Blood cultures NGTD.  MRSA PCR screen positive.  Trach aspirate culture pending.  Vancomycin  added.  Subjective: Seen and examined earlier this morning.  No major events overnight or this morning.  Patient cannot vocalize.  Currently on vent via trach.  No complaints.  Responds no to shortness of breath, chest pain   Assessment and plan: Sepsis due to ventilator associated pneumonia: Heart tachycardia, tachypnea and leukocytosis.  Patient with chronic vent via trach.  Prior history of ESBL infection.  Seems to be stable from breathing standpoint.  CXR  and CT chest suggested multifocal pneumonia.  Blood cultures NGTD.  MRSA PCR screen positive.  Trach aspirate Gram stain with GPC and GNR.  Sepsis physiology improved. -Continue IV meropenem  given history of ESBL -Continue vancomycin  given positive MRSA. -Vent management per pulmonology -Pulmonary toilet -Follow respiratory cultures.   Acute toxic and septic encephalopathy: Encephalopathy could be due to multiple sedating medications and sepsis.  CT head without acute finding.  Seems to have resolved. -Reorientation and delirium precaution -Manage sepsis as above.   Chronic respiratory failure s/p vent/trach/PEG -CCM consulted for vent mgt; apprec eval/recs   Essential hypertension: Elevated BP this morning.  There was soft last night. - Continue home amlodipine  - Will add another agent if remains elevated.  Dysphagia with PEG dependence - Continue tube feed.  Chronic back pain -On fentanyl  patch, p.o. Dilaudid , Lyrica    Anxiety/depression/insomnia - Continue home Cymbalta , Ativan  and melatonin  Diarrhea: C. difficile negative.  Likely due to tube feed. - Follow GIP. - Will start Imodium if GIP negative  Body mass index is 29.3 kg/m.         Pressure skin injury: Present on admission. Wound 11/14/24 1511 Pressure Injury Buttocks Right Stage 2 -  Partial thickness loss of dermis presenting as a shallow open injury with a red, pink wound bed without slough. (Active)     Wound 11/14/24 1511 Pressure Injury Pretibial Proximal;Right Unstageable - Full thickness tissue loss in which the base of the injury is covered by slough (yellow, tan, gray, green or brown) and/or eschar (tan, brown or black) in the wound bed. (Active)   DVT prophylaxis:  enoxaparin  (LOVENOX ) injection 40 mg Start: 11/14/24 2200  Code Status:  Full code Family Communication: None at the bedside Level of care: ICU Status is: Inpatient Remains inpatient appropriate because: Sepsis due to ventilator  associated pneumonia, and encephalopathy   Final disposition: Likely back to [facility]   55 minutes with more than 50% spent in reviewing records, counseling patient/family and coordinating care.  Consultants:  PCCM  Procedures: None  Microbiology summarized: COVID-19, influenza and RSV PCR nonreactive MRSA PCR screen positive Blood cultures NGTD Trach aspirate Gram stain with GPC and GNR C. difficile negative GIP pending  Objective: Vitals:   11/16/24 0500 11/16/24 0700 11/16/24 0717 11/16/24 0800  BP:  (!) 180/54  (!) 179/53  Pulse:  100  97  Resp:  (!) 22  (!) 22  Temp:   98.9 F (37.2 C)   TempSrc:   Oral   SpO2:  98% 98% 98%  Weight: 90 kg     Height:        Examination:  GENERAL: No apparent distress.  Nontoxic. HEENT: MMM.  Vision and hearing grossly intact.  NECK: On vent via trach. RESP:  No IWOB.  Fair aeration bilaterally. CVS:  RRR. Heart sounds normal.  ABD/GI/GU: BS+. Abd soft, NTND.  PEG tube in place. MSK/EXT: Quadriplegic. SKIN: no apparent skin lesion or wound NEURO: AA.  Seems to be fairly oriented.  Responds to questions by nodding.  Quadriplegia PSYCH: Calm. Normal affect.   Sch Meds:  Scheduled Meds:  amLODipine   10 mg Per NG tube Daily   artificial tears  1 drop Both Eyes Daily   DULoxetine   30 mg Oral BID   enoxaparin  (LOVENOX ) injection  40 mg Subcutaneous Q24H   feeding supplement (PROSource TF20)  60 mL Per Tube Daily   fentaNYL   1 patch Transdermal Q72H   free water   300 mL Per Tube Q4H   furosemide   20 mg Per Tube Daily   guaiFENesin   10 mL Per Tube TID   ipratropium-albuterol   3 mL Nebulization Q6H   loratadine   10 mg Per Tube Daily   melatonin  10 mg Per Tube QHS   mouth rinse  15 mL Mouth Rinse Q2H   pantoprazole  (PROTONIX ) IV  40 mg Intravenous Q24H   pregabalin   100 mg Per Tube TID   scopolamine   1 patch Transdermal Q72H   Continuous Infusions:  feeding supplement (OSMOLITE 1.5 CAL) 50 mL/hr (11/16/24 0657)    meropenem  (MERREM ) IV Stopped (11/16/24 0656)   vancomycin  Stopped (11/16/24 0923)   PRN Meds:.HYDROmorphone , lip balm, LORazepam , ondansetron , mouth rinse, polyethylene glycol  Antimicrobials: Anti-infectives (From admission, onward)    Start     Dose/Rate Route Frequency Ordered Stop   11/15/24 2000  vancomycin  (VANCOREADY) IVPB 1250 mg/250 mL        1,250 mg 166.7 mL/hr over 90 Minutes Intravenous Every 12 hours 11/15/24 0817 11/21/24 2359   11/15/24 0815  vancomycin  (VANCOREADY) IVPB 2000 mg/400 mL        2,000 mg 200 mL/hr over 120 Minutes Intravenous  Once 11/15/24 0719 11/15/24 0959   11/14/24 1400  meropenem  (MERREM ) 1 g in sodium chloride  0.9 % 100 mL IVPB        1 g 200 mL/hr over 30 Minutes Intravenous Every 8 hours 11/14/24 1324 11/20/24 2359   11/14/24 0930  piperacillin -tazobactam (ZOSYN ) IVPB 3.375 g        3.375 g 100 mL/hr over 30 Minutes Intravenous  Once 11/14/24 0925 11/14/24 1017        I have personally reviewed the following  labs and images: CBC: Recent Labs  Lab 11/14/24 0737 11/14/24 1600 11/15/24 0236 11/16/24 0753  WBC 23.2* 20.7* 12.7* 12.1*  HGB 13.3 13.1 11.1* 11.3*  HCT 42.0 41.5 34.7* 36.5*  MCV 91.1 90.6 89.4 90.6  PLT 389 357 303 325   BMP &GFR Recent Labs  Lab 11/14/24 0737 11/14/24 1600 11/15/24 0236 11/16/24 0753  NA 134*  --  138 140  K 3.8  --  3.4* 3.8  CL 93*  --  98 101  CO2 31  --  33* 33*  GLUCOSE 174*  --  139* 158*  BUN 12  --  17 16  CREATININE <0.30* <0.30* <0.30* <0.30*  CALCIUM 9.5  --  9.0 8.8*  MG  --   --  2.4 2.4  PHOS  --   --   --  2.7   CrCl cannot be calculated (This lab value cannot be used to calculate CrCl because it is not a number: <0.30). Liver & Pancreas: Recent Labs  Lab 11/14/24 0737 11/16/24 0753  AST 21  --   ALT 16  --   ALKPHOS 149*  --   BILITOT 0.6  --   PROT 9.0*  --   ALBUMIN 3.6 3.1*   No results for input(s): LIPASE, AMYLASE in the last 168 hours. No results for  input(s): AMMONIA in the last 168 hours. Diabetic: No results for input(s): HGBA1C in the last 72 hours. Recent Labs  Lab 11/15/24 1929  GLUCAP 91   Cardiac Enzymes: No results for input(s): CKTOTAL, CKMB, CKMBINDEX, TROPONINI in the last 168 hours. Recent Labs    11/14/24 0857  PROBNP 413.0*   Coagulation Profile: No results for input(s): INR, PROTIME in the last 168 hours. Thyroid Function Tests: No results for input(s): TSH, T4TOTAL, FREET4, T3FREE, THYROIDAB in the last 72 hours. Lipid Profile: No results for input(s): CHOL, HDL, LDLCALC, TRIG, CHOLHDL, LDLDIRECT in the last 72 hours. Anemia Panel: No results for input(s): VITAMINB12, FOLATE, FERRITIN, TIBC, IRON, RETICCTPCT in the last 72 hours. Urine analysis:    Component Value Date/Time   COLORURINE YELLOW 12/27/2023 2310   APPEARANCEUR CLOUDY (A) 12/27/2023 2310   LABSPEC 1.008 12/27/2023 2310   PHURINE 5.0 12/27/2023 2310   GLUCOSEU NEGATIVE 12/27/2023 2310   HGBUR SMALL (A) 12/27/2023 2310   BILIRUBINUR NEGATIVE 12/27/2023 2310   KETONESUR NEGATIVE 12/27/2023 2310   PROTEINUR NEGATIVE 12/27/2023 2310   NITRITE POSITIVE (A) 12/27/2023 2310   LEUKOCYTESUR LARGE (A) 12/27/2023 2310   Sepsis Labs: Invalid input(s): PROCALCITONIN, LACTICIDVEN  Microbiology: Recent Results (from the past 240 hours)  Resp panel by RT-PCR (RSV, Flu A&B, Covid) Anterior Nasal Swab     Status: None   Collection Time: 11/14/24  7:38 AM   Specimen: Anterior Nasal Swab  Result Value Ref Range Status   SARS Coronavirus 2 by RT PCR NEGATIVE NEGATIVE Final   Influenza A by PCR NEGATIVE NEGATIVE Final   Influenza B by PCR NEGATIVE NEGATIVE Final    Comment: (NOTE) The Xpert Xpress SARS-CoV-2/FLU/RSV plus assay is intended as an aid in the diagnosis of influenza from Nasopharyngeal swab specimens and should not be used as a sole basis for treatment. Nasal washings and aspirates are  unacceptable for Xpert Xpress SARS-CoV-2/FLU/RSV testing.  Fact Sheet for Patients: bloggercourse.com  Fact Sheet for Healthcare Providers: seriousbroker.it  This test is not yet approved or cleared by the [place]  FDA and has been authorized for detection and/or diagnosis of SARS-CoV-2 by FDA under  an Emergency Use Authorization (EUA). This EUA will remain in effect (meaning this test can be used) for the duration of the COVID-19 declaration under Section 564(b)(1) of the Act, 21 U.S.C. section 360bbb-3(b)(1), unless the authorization is terminated or revoked.     Resp Syncytial Virus by PCR NEGATIVE NEGATIVE Final    Comment: (NOTE) Fact Sheet for Patients: bloggercourse.com  Fact Sheet for Healthcare Providers: seriousbroker.it  This test is not yet approved or cleared by the [place]  FDA and has been authorized for detection and/or diagnosis of SARS-CoV-2 by FDA under an Emergency Use Authorization (EUA). This EUA will remain in effect (meaning this test can be used) for the duration of the COVID-19 declaration under Section 564(b)(1) of the Act, 21 U.S.C. section 360bbb-3(b)(1), unless the authorization is terminated or revoked.  Performed at [facility], [address], [place], [place] [address]   Blood culture (routine x 2)     Status: None (Preliminary result)   Collection Time: 11/14/24  8:57 AM   Specimen: BLOOD  Result Value Ref Range Status   Specimen Description BLOOD SITE NOT SPECIFIED  Final   Special Requests   Final    BOTTLES DRAWN AEROBIC AND ANAEROBIC Blood Culture results may not be optimal due to an inadequate volume of blood received in culture bottles   Culture   Final    NO GROWTH 2 DAYS Performed at [facility], [address] [place], [place] [address]    Report Status PENDING  Incomplete  MRSA Next Gen by PCR, Nasal      Status: Abnormal   Collection Time: 11/14/24  8:58 AM   Specimen: Nasal Mucosa; Nasal Swab  Result Value Ref Range Status   MRSA by PCR Next Gen DETECTED (A) NOT DETECTED Final    Comment: (NOTE) The GeneXpert MRSA Assay (FDA approved for NASAL specimens only), is one component of a comprehensive MRSA colonization surveillance program. It is not intended to diagnose MRSA infection nor to guide or monitor treatment for MRSA infections. Test performance is not FDA approved in patients less than 59 years old. Performed at [facility], [address], [place], [place] [address]   Blood culture (routine x 2)     Status: None (Preliminary result)   Collection Time: 11/14/24  4:00 PM   Specimen: BLOOD RIGHT HAND  Result Value Ref Range Status   Specimen Description BLOOD RIGHT HAND  Final   Special Requests   Final    BOTTLES DRAWN AEROBIC ONLY Blood Culture adequate volume   Culture   Final    NO GROWTH 2 DAYS Performed at [facility], [address] [place], [place] [address]    Report Status PENDING  Incomplete  MRSA Next Gen by PCR, Nasal     Status: Abnormal   Collection Time: 11/14/24  4:22 PM   Specimen: Nasal Mucosa; Nasal Swab  Result Value Ref Range Status   MRSA by PCR Next Gen DETECTED (A) NOT DETECTED Final    Comment: RESULT CALLED TO, READ BACK BY AND VERIFIED WITH: DOROTHA BATTIEST, RN [phone] 11/15/24 BY VIN (NOTE) The GeneXpert MRSA Assay (FDA approved for NASAL specimens only), is one component of a comprehensive MRSA colonization surveillance program. It is not intended to diagnose MRSA infection nor to guide or monitor treatment for MRSA infections. Test performance is not FDA approved in patients less than 83 years old. Performed at [facility], [address]  [place], [place] [address]   Culture, Respiratory w Gram Stain     Status: None (Preliminary result)   Collection Time: 11/14/24  4:47 PM   Specimen: Tracheal Aspirate;  Respiratory  Result Value Ref Range Status   Specimen Description   Final    TRACHEAL ASPIRATE Performed at [facility], [address] [place], [place] [address]    Special Requests   Final    NONE Performed at [facility], [address] [place], [place] [address]    Gram Stain   Final    FEW WBC PRESENT, PREDOMINANTLY PMN FEW GRAM POSITIVE COCCI RARE GRAM NEGATIVE RODS    Culture   Final    TOO YOUNG TO READ Performed at [facility], [address] [place], [place] [address]    Report Status PENDING  Incomplete  C Difficile Quick Screen w PCR reflex     Status: None   Collection Time: 11/15/24  6:00 PM   Specimen: STOOL  Result Value Ref Range Status   C Diff antigen NEGATIVE NEGATIVE Final   C Diff toxin NEGATIVE NEGATIVE Final   C Diff interpretation No C. difficile detected.  Final    Comment: Performed at [facility], [address] [place], [place] [address]    Radiology Studies: No results found.     Jurgen Groeneveld T. Cherysh Epperly [facility]  If 7PM-7AM, please contact night-coverage [email or web address] 11/16/2024, 10:35 AM   "

## 2024-11-16 NOTE — Plan of Care (Incomplete)

## 2024-11-16 NOTE — Progress Notes (Signed)
" ° °  NAME:  JAYMIE MISCH, MRN:  [id number], DOB:  [birth year], LOS: 2 ADMISSION DATE:  11/14/2024, CONSULTATION DATE: 11/14/2024 REFERRING MD: Dr. Neldon, CHIEF COMPLAINT: Altered mental status  History of Present Illness:  74 yo M [facility] resident with complex PMH -- trach/vent dependence, quadriplegia, HTN, who presented to [facility] 11/14/24 with AMS. Sounds like at [facility] the last couple of days he had wet lung sounds and a CXR may have shown some pulm edema so was diuresed. He felt poorly day of presentation but does not have any specific complaints for me. Wife feels he is more lethargic than usual. ED workup w ASD -- PNA +/- edema, and bilateral pleural effusions   Patient is planned for admission for sepsis 2/2 PNA  He has a hx of MDRO   PCCM is consulted for trach vent   Pertinent  Medical History  Chronic trach, vent Functional quadriplegia MDRO pneumonias  Significant Hospital Events: Including procedures, antibiotic start and stop dates in addition to other pertinent events   1/23-admitted, CCM consult.  CT chest-bibasal infiltrative process  Interim History / Subjective:  No overnight events Remains on vent Continues on antibiotics  Objective    Blood pressure (!) 119/42, pulse 68, temperature 99.4 F (37.4 C), temperature source Oral, resp. rate (!) 22, height 5' 9 (1.753 m), weight 90 kg, SpO2 99%.    Vent Mode: PCV FiO2 (%):  [40 %] 40 % Set Rate:  [22 bmp] 22 bmp PEEP:  [5 cmH20] 5 cmH20 Plateau Pressure:  [18 cmH20-20 cmH20] 19 cmH20   Intake/Output Summary (Last 24 hours) at 11/16/2024 0717 Last data filed at 11/16/2024 9342 Gross per 24 hour  Intake 3043.99 ml  Output 700 ml  Net 2343.99 ml   Filed Weights   11/14/24 0623 11/16/24 0500  Weight: 90 kg 90 kg    Examination: General: Elderly, does not appear to be in distress HENT: Tracheostomy site looks fair, Remains on vent Lungs: Fair air entry bilaterally Cardiovascular: S1-S2 appreciated Abdomen:  Soft, bowel sounds appreciated, PEG in place Extremities: Decreased muscle mass/tone Neuro: Awake, tries to mouth to interact GU:   I reviewed last 24 h vitals and pain scores, last 48 h intake and output, last 24 h labs and trends, and last 24 h imaging results. WBC improved   Resolved problem list   Assessment and Plan   Chronic respiratory failure Tracheostomy/ventilator dependence - No weaning, remains on pressure control 20 above PEEP of 5, 40% FiO2 - No changes to vent settings  Multilobar pneumonia HAP - On meropenem  - Vancomycin  added with MRSA PCR positive  Sepsis  -Continue antibiotics -On meropenem  day 3 of 7 -On vancomycin  day 2 of 7 - Blood cultures negative to date  There was concern for pleural effusion initially - No significant effusion on CT  Continue antibiotic therapy Pulmonary toileting  Jennet Epley, MD [place] P[facility]M Pager: See Amion       "

## 2024-11-16 NOTE — Consult Note (Addendum)
" °  CLINICAL SUPPORT TEAM - WOUND OSTOMY AND CONTINENCE TEAM  CONSULTATION SERVICES   WOC Nurse-Inpatient Note  Reason for Consult: buttocks and leg wounds  Wound type: 1.  Deep Tissue Pressure Injury R buttock purple maroon discoloration that is evolving with red moist tissue and some tan necrotic tissue developing distal aspect; stage 2 Pressure Injury noted superior to this larger wound  2.  Unstageable Pressure Injury R lower leg 80% black tan eschar 20% red  3.  Stage 2 Pressure Injury R posterior thigh red moist  Pressure Injury POA: Yes Measurement: per nursing flowsheet  Wound bed: as above  Drainage (amount, consistency, odor) see nursing flowsheet  Periwound: erythema  Dressing procedure/placement/frequency:  Cleanse R buttocks  and R posterior thigh wounds with Vashe, do not rinse.  Apply Xeroform gauze ([id number]) to wound beds daily and secure with silicone foam.  Cleanse R lower leg wound with Vashe, do not rinse.  Apply 1/4 thick layer of Santyl  to wound bed daily, cover with saline moist gauze, dry gauze and secure with silicone foam or Kerlix roll gauze whichever is preferred.   Patient should remain on a low air loss mattress if moved out of ICU setting for pressure redistribution.  Deep Tissue Pressure Injuries are high risk to deteriorate.  Please reconsult for worsening of R buttock wound bed.    Thank you,    Italia Wolfert MSN, RN-BC, CWOCN       "

## 2024-11-17 ENCOUNTER — Other Ambulatory Visit: Payer: Self-pay

## 2024-11-17 DIAGNOSIS — G9340 Encephalopathy, unspecified: Secondary | ICD-10-CM | POA: Diagnosis not present

## 2024-11-17 LAB — GLUCOSE, CAPILLARY
Glucose-Capillary: 135 mg/dL — ABNORMAL HIGH (ref 70–99)
Glucose-Capillary: 101 mg/dL — ABNORMAL HIGH (ref 70–99)
Glucose-Capillary: 114 mg/dL — ABNORMAL HIGH (ref 70–99)
Glucose-Capillary: 116 mg/dL — ABNORMAL HIGH (ref 70–99)

## 2024-11-17 LAB — BLOOD GAS, ARTERIAL
Acid-Base Excess: 12.4 mmol/L — ABNORMAL HIGH (ref 0.0–2.0)
Bicarbonate: 37.4 mmol/L — ABNORMAL HIGH (ref 20.0–28.0)
FIO2: 40 %
O2 Saturation: 100 %
PEEP: 5 cmH2O
Patient temperature: 37.2
Pressure support: 20 cmH2O
RATE: 22 {breaths}/min
pCO2 arterial: 48 mmHg (ref 32–48)
pH, Arterial: 7.5 — ABNORMAL HIGH (ref 7.35–7.45)
pO2, Arterial: 102 mmHg (ref 83–108)

## 2024-11-17 LAB — CULTURE, RESPIRATORY W GRAM STAIN

## 2024-11-17 LAB — CBC
HCT: 31.4 % — ABNORMAL LOW (ref 39.0–52.0)
Hemoglobin: 10.2 g/dL — ABNORMAL LOW (ref 13.0–17.0)
MCH: 29 pg (ref 26.0–34.0)
MCHC: 32.5 g/dL (ref 30.0–36.0)
MCV: 89.2 fL (ref 80.0–100.0)
Platelets: 309 10*3/uL (ref 150–400)
RBC: 3.52 MIL/uL — ABNORMAL LOW (ref 4.22–5.81)
RDW: 15.3 % (ref 11.5–15.5)
WBC: 9.1 10*3/uL (ref 4.0–10.5)
nRBC: 0 % (ref 0.0–0.2)

## 2024-11-17 LAB — RENAL FUNCTION PANEL
Albumin: 2.8 g/dL — ABNORMAL LOW (ref 3.5–5.0)
Anion gap: 8 (ref 5–15)
BUN: 20 mg/dL (ref 8–23)
CO2: 30 mmol/L (ref 22–32)
Calcium: 8.2 mg/dL — ABNORMAL LOW (ref 8.9–10.3)
Chloride: 101 mmol/L (ref 98–111)
Creatinine, Ser: <0.3 mg/dL — ABNORMAL LOW (ref 0.61–1.24)
Glucose, Bld: 101 mg/dL — ABNORMAL HIGH (ref 70–99)
Phosphorus: 2 mg/dL — ABNORMAL LOW (ref 2.5–4.6)
Potassium: 3.3 mmol/L — ABNORMAL LOW (ref 3.5–5.1)
Sodium: 138 mmol/L (ref 135–145)

## 2024-11-17 LAB — BASIC METABOLIC PANEL WITH GFR
Anion gap: 7 (ref 5–15)
BUN: 20 mg/dL (ref 8–23)
CO2: 31 mmol/L (ref 22–32)
Calcium: 8.2 mg/dL — ABNORMAL LOW (ref 8.9–10.3)
Chloride: 101 mmol/L (ref 98–111)
Creatinine, Ser: <0.3 mg/dL — ABNORMAL LOW (ref 0.61–1.24)
Glucose, Bld: 101 mg/dL — ABNORMAL HIGH (ref 70–99)
Potassium: 3.3 mmol/L — ABNORMAL LOW (ref 3.5–5.1)
Sodium: 138 mmol/L (ref 135–145)

## 2024-11-17 LAB — MAGNESIUM: Magnesium: 2.1 mg/dL (ref 1.7–2.4)

## 2024-11-17 LAB — AMMONIA: Ammonia: 31 umol/L (ref 9–35)

## 2024-11-17 LAB — LACTIC ACID, PLASMA: Lactic Acid, Venous: 0.9 mmol/L (ref 0.5–1.9)

## 2024-11-17 MED ORDER — STERILE WATER FOR IRRIGATION IR SOLN: Freq: Once | Status: AC

## 2024-11-17 MED ORDER — SODIUM CHLORIDE 0.9 % IV SOLN
2.0000 g | Freq: Three times a day (TID) | INTRAVENOUS | Status: DC
  Administered 2024-11-17 – 2024-11-19 (×5): 2 g via INTRAVENOUS
  Filled 2024-11-17 (×5): qty 12.5

## 2024-11-17 MED ORDER — PROSOURCE TF20 ENFIT COMPATIBL EN LIQD
60.0000 mL | Freq: Two times a day (BID) | ENTERAL | Status: DC
  Administered 2024-11-17 – 2024-11-19 (×4): 60 mL
  Filled 2024-11-17 (×4): qty 60

## 2024-11-17 MED ORDER — POTASSIUM CHLORIDE 10 MEQ/100ML IV SOLN
10.0000 meq | INTRAVENOUS | Status: AC
  Administered 2024-11-17 (×4): 10 meq via INTRAVENOUS
  Filled 2024-11-17 (×4): qty 100

## 2024-11-17 MED ORDER — SODIUM BICARBONATE 650 MG PO TABS
325.0000 mg | ORAL_TABLET | Freq: Once | ORAL | Status: AC
  Administered 2024-11-17: 325 mg
  Filled 2024-11-17: qty 1

## 2024-11-17 MED ORDER — PANCRELIPASE (LIP-PROT-AMYL) 12000-38000 UNITS PO CPEP
12000.0000 [IU] | ORAL_CAPSULE | Freq: Once | ORAL | Status: DC
  Filled 2024-11-17: qty 1

## 2024-11-17 MED ORDER — MUPIROCIN 2 % EX OINT
1.0000 | TOPICAL_OINTMENT | Freq: Two times a day (BID) | CUTANEOUS | Status: DC
  Administered 2024-11-17 – 2024-11-19 (×5): 1 via NASAL
  Filled 2024-11-17: qty 22

## 2024-11-17 NOTE — TOC Initial Note (Signed)
 Transition of Care [facility] - Initial/Assessment Note    Patient Details  Name: Andrew Rivera MRN: [id number] Date of Birth: August 10, 1951  Transition of Care [facility]) CM/SW Contact:    Jon ONEIDA Anon, RN Phone Number: 11/17/2024, 12:10 PM  Clinical Narrative:                 Pt here from [facility]. Pt is trach and vented. Pt came into the ED with complaints of lethargy, increased secretions, and elevated blood pressure. Pt admitted with diagnosis of Acute encephalopathy. Pt needing continued medical workup, not medically ready for discharge. RNCM called [facility], Admission Coordinator at [facility], left VM for return call to discuss pt return once medically ready. ICM will continue to follow for DC planning needs.   Expected Discharge Plan: Long Term Acute Care (LTAC) Barriers to Discharge: Continued Medical Work up   Patient Goals and CMS Choice Patient states their goals for this hospitalization and ongoing recovery are:: Return to [facility] CMS Medicare.gov Compare Post Acute Care list provided to:: Patient Represenative (must comment) (Marrazzo,Donna- pt spouse) Choice offered to / list presented to : Spouse [place] ownership interest in [facility].provided to:: Spouse    Expected Discharge Plan and Services In-house Referral: NA Discharge Planning Services: CM Consult Post Acute Care Choice: Long Term Acute Care (LTAC) Living arrangements for the past 2 months: Skilled Nursing Facility ([facility])                 DME Arranged: N/A DME Agency: NA       HH Arranged: NA HH Agency: NA        Prior Living Arrangements/Services Living arrangements for the past 2 months: Skilled Nursing Facility ([facility]) Lives with:: Facility Resident Patient language and need for interpreter reviewed:: Yes Do you feel safe going back to the place where you live?: Yes      Need for Family Participation in Patient Care: Yes (Comment) Care giver support system in  place?: Yes (comment) Current home services: Other (comment) Criminal Activity/Legal Involvement Pertinent to Current Situation/Hospitalization: No - Comment as needed  Activities of Daily Living   ADL Screening (condition at time of admission) Independently performs ADLs?: No Does the patient have a NEW difficulty with bathing/dressing/toileting/self-feeding that is expected to last >3 days?: No Does the patient have a NEW difficulty with getting in/out of bed, walking, or climbing stairs that is expected to last >3 days?: No Does the patient have a NEW difficulty with communication that is expected to last >3 days?: No Is the patient deaf or have difficulty hearing?: No Does the patient have difficulty seeing, even when wearing glasses/contacts?: No Does the patient have difficulty concentrating, remembering, or making decisions?: No  Permission Sought/Granted Permission sought to share information with : Facility Medical Sales Representative, Family Supports    Share Information with NAME: Branon, Sabine, Emergency Contact  [phone]  Permission granted to share info w AGENCY: [facility]        Emotional Assessment Appearance:: Other (Comment Required (UTA) Attitude/Demeanor/Rapport: Unable to Assess Affect (typically observed): Unable to Assess   Alcohol  / Substance Use: Not Applicable Psych Involvement: No (comment)  Admission diagnosis:  Acute encephalopathy [G93.40] Pneumonia due to infectious organism, unspecified laterality, unspecified part of lung [J18.9] Patient Active Problem List   Diagnosis Date Noted   HAP (hospital-acquired pneumonia) 01/07/2024   Acute respiratory failure with hypoxia (HCC) 12/28/2023   Pneumonia 12/28/2023   Acute  on chronic heart failure with preserved ejection fraction (HFpEF) (HCC) 12/28/2023   UTI (urinary tract infection) 12/28/2023   Acute encephalopathy 12/28/2023   Gross hematuria 09/08/2023   Depression  09/08/2023   GAD (generalized anxiety disorder) 09/08/2023   Allergic rhinitis 09/08/2023   Leukocytosis 09/08/2023   Stage 1 skin ulcer of sacral region (HCC) 06/16/2022   Staring episodes 06/15/2022   Malnutrition of moderate degree 06/30/2021   Left inguinal hernia 06/29/2021   Dysautonomia (HCC)    Ventilator dependent (HCC)    Tracheostomy dependence (HCC)    Chronic hypoxic respiratory failure (HCC)    Abdominal distension    Constipation    Hypokalemia    Ileus (HCC) 04/24/2021   Pressure injury of skin 01/28/2020   Acute on chronic respiratory failure with hypoxia (HCC)    Septic shock (HCC)    Altered mental status, unspecified    Critical illness myopathy    Chronic pain syndrome    Internal jugular (IJ) vein thromboembolism, acute, unspecified laterality (HCC)    Lumbosacral radiculopathy 08/04/2019   Radicular pain of right lower extremity 08/07/2013   PCP:  Carolee Darin BRAVO, MD Pharmacy:   [facility] [address] [place] [address] Phone: [phone] Fax: [phone]     Social Drivers of Health (SDOH) Social History: SDOH Screenings   Food Insecurity: Patient Unable To Answer (11/15/2024)  Housing: Unknown (11/15/2024)  Transportation Needs: Patient Unable To Answer (11/15/2024)  Utilities: Patient Unable To Answer (11/15/2024)  Social Connections: Patient Unable To Answer (11/15/2024)  Tobacco Use: Medium Risk (11/14/2024)   SDOH Interventions:     Readmission Risk Interventions    11/17/2024   11:44 AM 01/08/2024    9:50 AM 09/10/2023    3:49 PM  Readmission Risk Prevention Plan  Transportation Screening Complete Complete Complete  PCP or Specialist Appt within 5-7 Days   Complete  PCP or Specialist Appt within 3-5 Days Complete Complete   Home Care Screening   Complete  Medication Review (RN CM)   Complete  HRI or Home Care Consult Complete Complete   Social Work Consult for Recovery Care  Planning/Counseling Complete Complete   Palliative Care Screening Not Applicable Not Applicable   Medication Review Oceanographer) Complete Referral to Pharmacy

## 2024-11-17 NOTE — Progress Notes (Signed)
 eLink Physician-Brief Progress Note Patient Name: Andrew Rivera DOB: 28-Nov-1950 MRN: [id number]   Date of Service  11/17/2024  HPI/Events of Note  Notified for change in mental status.  Pt reported to have been awake and cooperative earlier in the shift, now no longer waking with sternal rub. Pupils midline, no nystagmus.  BSRN also notes that BP had dropped - from being extremely hypertensive with SBP 200s down so 90-120 after labetalol .  Pt also received cymbalta  and lyrica , which he is on chronically.    eICU Interventions  Pt appears to have had similar symptoms a few days ago, attributed to metabolic encephalopathy.  Will check BMP, CBC, ABG, lactate, ammonia.  Now starting to wake up again. Will continue to monitor mental status closely.          Andrew Rivera M [name] 11/17/2024, 12:28 AM

## 2024-11-17 NOTE — Progress Notes (Signed)
 Initial Nutrition Assessment  DOCUMENTATION CODES:   Not applicable  INTERVENTION:  - Continue current TF regimen via PEG: Osmolite 1.5 at 50 ml/h (1200 ml per day) Prosource TF20 60 ml BID Provides 1960 kcal, 115 gm protein, 914 ml free water  daily  - FWF per CCM. Currently ordered 300mL Q4H ( ). In addition to tube feeds, provides 2738mL/day.   - Monitor weight trends.  NUTRITION DIAGNOSIS:   Inadequate oral intake related to inability to eat (chronic trach to vent) as evidenced by NPO status (PEG for nutrition).  GOAL:   Patient will meet greater than or equal to 90% of their needs  MONITOR:   Vent status, Labs, Weight trends, TF tolerance, Skin  REASON FOR ASSESSMENT:   Ventilator, New TF    ASSESSMENT:   74 y.o. male with PMH of chronic hypoxic RF on vent via trach, PEG on TF, quadriplegia after back surgeries, anxiety, depression, ESBL bacteremia and ventilator associated pneumonia who presented due to AMS, lethargy, increased secretion, and admitted with acute encephalopathy and sepsis due to ventilator associated pneumonia.   1/23 Admit  Patient is on the ventilator via tracheostomy. MV: 10 L/min Temp (24hrs), Avg:99 F (37.2 C), Min:98.2 F (36.8 C), Max:100 F (37.8 C)  RD working remotely.  Per chart review, weight has been stable since March 2025.  Patient started on Osmolite 1.5 @ 50mL/hr + 1 pack of ProSource TF20 on 1/23. Per RN, patient tolerating tube feeds well with no issues.  Will continue current regimen as monitor for need to adjust. Will add additional pack of Prosource TF20 to meet protein needs.   Medications reviewed and include: Q4H FWF, Lasix   Labs reviewed:  K+ 3.3 Phosphorus 2.0   NUTRITION - FOCUSED PHYSICAL EXAM:  RD working remotely  Diet Order:   Diet Order             Diet NPO time specified  Diet effective now                   EDUCATION NEEDS:  Not appropriate for education at this  time  Skin:  Skin Assessment: Skin Integrity Issues: Skin Integrity Issues:: DTI, Unstageable DTI: Right Buttocks Unstageable: Right Pretibial  Last BM:  1/24  Height:  Ht Readings from Last 1 Encounters:  11/14/24 5' 9 (1.753 m)   Weight:  Wt Readings from Last 1 Encounters:  11/17/24 90.6 kg   Ideal Body Weight:  65.45 kg (adjusted 10% for quadriplegia)  BMI:  Body mass index is 29.5 kg/m.  Estimated Nutritional Needs:  Kcal:  1750-1950 kcals Protein:  100-120 grams Fluid:  >/= 1.8L    Trude Ned RD, LDN Contact via Secure Chat.

## 2024-11-17 NOTE — Progress Notes (Addendum)
 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 Recently started on diuretics for fluid on his lungs at facility.  In ED, slightly tachycardic and tachypneic.  BP elevated to 194/57.  WBC 23.2 with left shift.  Lactic acid normal.  proBNP 413.  COVID-19, influenza and RSV PCR nonreactive.  CT head without acute finding.  CXR suggested vascular congestion with patchy nodular airspace disease bilaterally and bilateral pleural effusions.  CT chest without contrast concerning for multifocal pneumonia, probable aspirated material within the bronchi bilaterally, but most prominently seen in right mediastinal bronchus.  Cultures obtained.  Started on IV meropenem .  PCCM consulted.    The next day, encephalopathy resolved.  Blood cultures NGTD.  MRSA PCR screen positive.  Trach aspirate culture with Pseudomonas and Proteus mirabilis.  Vancomycin  discontinued.  Remains on IV meropenem .  Subjective: Seen and examined earlier this morning.  Had hypotension and altered mental status overnight that seems to have resolved this morning.  His blood pressure was elevated with systolic to 200s yesterday afternoon.  He was started on additional antihypertensive meds with as needed labetalol .  Has no complaint this morning.   Assessment and plan: Sepsis due to ventilator associated  pneumonia: Heart tachycardia, tachypnea and leukocytosis.  Patient with chronic vent via trach.   Seems to be stable from breathing standpoint.  CXR and CT chest suggested multifocal pneumonia.  Blood cultures NGTD.  MRSA PCR screen positive.  Trach aspirate positive for Pseudomonas and Proteus. Prior history of ESBL infection.  Sepsis physiology improved. -Continue IV meropenem  given history of ESBL -Discontinue IV vancomycin . -Vent management per pulmonology -Pulmonary toilet -Follow sensitivity on respiratory cultures. -Continue contact precaution  Acute toxic and septic encephalopathy: Encephalopathy could be due to multiple sedating medications and sepsis.  CT head without acute finding.  -Reorientation and delirium precaution -Manage sepsis as above.   Chronic respiratory failure s/p vent/trach/PEG -CCM consulted for vent mgt; apprec eval/recs   Essential hypertension: Labile blood pressure -Continue home amlodipine  with holding parameters. -P.o. hydralazine  as needed -IV labetalol  as needed  Dysphagia with PEG dependence - Continue tube feed.  BMP glucose within appropriate range.  Chronic back pain -On fentanyl  patch, p.o. Dilaudid , Lyrica    Anxiety/depression/insomnia - Continue home Cymbalta , Ativan  and melatonin  Hypokalemia -Monitor replenish K and Mg as appropriate  Diarrhea: C. difficile negative.  Likely due to tube feed.  C. difficile and GIP negative.  At risk for polypharmacy: Might be contributing to patient's encephalopathy.  He is on fentanyl  patch, Dilaudid , lorazepam , melatonin, Lyrica  and the scopolamine  patch. Difficult situation given his chronic pain and anxiety.   Inadequate oral intake Body mass index is 29.5 kg/m. Nutrition Problem: Inadequate oral intake Etiology: inability to eat (chronic trach to vent) Signs/Symptoms: NPO status (PEG for nutrition) Interventions: Refer to RD note for recommendations, Tube feeding  Pressure skin injury:  Present on admission. Wound 11/14/24 1511 Pressure Injury Buttocks  Right Deep Tissue Pressure Injury - Purple or maroon localized area of discolored intact skin or blood-filled blister due to damage of underlying soft tissue from pressure and/or shear. (Active)     Wound 11/14/24 1511 Pressure Injury Pretibial Proximal;Right Unstageable - Full thickness tissue loss in which the base of the injury is covered by slough (yellow, tan, gray, green or brown) and/or eschar (tan, brown or black) in the wound bed. (Active)   DVT prophylaxis:  enoxaparin  (LOVENOX ) injection 40 mg Start: 11/14/24 2200  Code Status: Full code Family Communication: Updated patient's wife over the phone. Level of care: ICU Status is: Inpatient Remains inpatient appropriate because: Sepsis due to ventilator associated pneumonia, labile blood pressure and encephalopathy   Final disposition: Likely back to [facility]   55 minutes with more than 50% spent in reviewing records, counseling patient/family and coordinating care.  Consultants:  PCCM  Procedures: None  Microbiology summarized: COVID-19, influenza and RSV PCR nonreactive MRSA PCR screen positive Blood cultures NGTD Trach aspirate Gram stain with Pseudomonas and Proteus mirabilis. C. difficile negative GIP negative.  Objective: Vitals:   11/17/24 0700 11/17/24 0800 11/17/24 0802 11/17/24 0900  BP:  (!) 124/35 (!) 124/35 (!) 147/41  Pulse:  76 76 81  Resp:  20 (!) 22 (!) 22  Temp:   98.2 F (36.8 C)   TempSrc:   Oral   SpO2:  99% 99% 99%  Weight: 90.6 kg     Height:        Examination:  GENERAL: No apparent distress.  Nontoxic. HEENT: MMM.  Vision and hearing grossly intact.  NECK: On vent via trach. RESP:  No IWOB.  Fair aeration bilaterally. CVS:  RRR. Heart sounds normal.  ABD/GI/GU: BS+. Abd soft, NTND.  PEG tube in place. MSK/EXT: Quadriplegic. SKIN: no apparent skin lesion or wound NEURO: Sleepy but wakes to voice.  Seems to be  fairly oriented.  Not vocal but seems to respond to yes and no questions appropriately.   Quadriplegia PSYCH: Calm. Normal affect.   Sch Meds:  Scheduled Meds:  amLODipine   10 mg Per NG tube Daily   artificial tears  1 drop Both Eyes Daily   collagenase   1 Application Topical Daily   DULoxetine   30 mg Oral BID   enoxaparin  (LOVENOX ) injection  40 mg Subcutaneous Q24H   feeding supplement (PROSource TF20)  60 mL Per Tube BID   fentaNYL   1 patch Transdermal Q72H   free water   300 mL Per Tube Q4H   furosemide   20 mg Per Tube Daily   guaiFENesin   10 mL Per Tube TID   ipratropium-albuterol   3 mL Nebulization Q6H   lipase/protease/amylase  12,000 Units Oral Once   loratadine   10 mg Per Tube Daily   melatonin  10 mg Per Tube QHS   mupirocin  ointment  1 Application Nasal BID   mouth rinse  15 mL Mouth Rinse Q2H   pantoprazole  (PROTONIX ) IV  40 mg Intravenous Q24H   pregabalin   100 mg Per Tube TID   scopolamine   1 patch Transdermal Q72H   Continuous Infusions:  feeding supplement (OSMOLITE 1.5 CAL) 50 mL/hr (11/17/24 0610)   meropenem  (MERREM ) IV Stopped (11/17/24 9391)   PRN Meds:.acetaminophen  (TYLENOL ) oral liquid 160 mg/5 mL, hydrALAZINE , HYDROmorphone , labetalol , lip balm, LORazepam , ondansetron , mouth rinse, polyethylene glycol  Antimicrobials: Anti-infectives (From admission, onward)    Start     Dose/Rate Route Frequency Ordered Stop   11/15/24 2000  vancomycin  (VANCOREADY) IVPB 1250 mg/250 mL  Status:  Discontinued        1,250 mg 166.7 mL/hr over 90 Minutes Intravenous Every 12 hours 11/15/24 0817 11/17/24 0956   11/15/24 0815  vancomycin  (VANCOREADY) IVPB 2000 mg/400 mL        2,000 mg 200 mL/hr over 120 Minutes Intravenous  Once 11/15/24 0719 11/15/24 0959   11/14/24 1400  meropenem  (MERREM ) 1 g in sodium chloride  0.9 % 100 mL IVPB        1 g 200 mL/hr over 30 Minutes Intravenous Every 8 hours 11/14/24 1324 11/20/24 2359   11/14/24 0930  piperacillin -tazobactam (ZOSYN )  IVPB 3.375 g        3.375 g 100 mL/hr over 30 Minutes Intravenous  Once 11/14/24 0925 11/14/24 1017        I have personally reviewed the following labs and images: CBC: Recent Labs  Lab 11/14/24 0737 11/14/24 1600 11/15/24 0236 11/16/24 0753 11/17/24 0106  WBC 23.2* 20.7* 12.7* 12.1* 9.1  HGB 13.3 13.1 11.1* 11.3* 10.2*  HCT 42.0 41.5 34.7* 36.5* 31.4*  MCV 91.1 90.6 89.4 90.6 89.2  PLT 389 357 303 325 309   BMP &GFR Recent Labs  Lab 11/14/24 0737 11/14/24 1600 11/15/24 0236 11/16/24 0753 11/17/24 0106 11/17/24 0108  NA 134*  --  138 140 138 138  K 3.8  --  3.4* 3.8 3.3* 3.3*  CL 93*  --  98 101 101 101  CO2 31  --  33* 33* 30 31  GLUCOSE 174*  --  139* 158* 101* 101*  BUN 12  --  17 16 20 20   CREATININE <0.30* <0.30* <0.30* <0.30* <0.30* <0.30*  CALCIUM 9.5  --  9.0 8.8* 8.2* 8.2*  MG  --   --  2.4 2.4 2.1  --   PHOS  --   --   --  2.7 2.0*  --    CrCl cannot be calculated (This lab value cannot be used to calculate CrCl because it is not a number: <0.30). Liver & Pancreas: Recent Labs  Lab 11/14/24 0737 11/16/24 0753 11/17/24 0106  AST 21  --   --   ALT 16  --   --   ALKPHOS 149*  --   --   BILITOT 0.6  --   --   PROT 9.0*  --   --   ALBUMIN 3.6 3.1* 2.8*   No results for input(s): LIPASE, AMYLASE in the last 168 hours. Recent Labs  Lab 11/17/24 0106  AMMONIA 31   Diabetic: No results for input(s): HGBA1C in the last 72 hours. Recent Labs  Lab 11/15/24 1929 11/17/24 0017 11/17/24 0743  GLUCAP 91 101* 135*   Cardiac Enzymes: No results for input(s): CKTOTAL, CKMB, CKMBINDEX, TROPONINI in the last 168 hours. Recent Labs    11/14/24 0857  PROBNP 413.0*   Coagulation Profile: No results for input(s): INR, PROTIME in the last 168 hours. Thyroid Function Tests: No results for input(s): TSH, T4TOTAL, FREET4, T3FREE, THYROIDAB in the last 72 hours. Lipid Profile: No results for input(s): CHOL, HDL,  LDLCALC, TRIG, CHOLHDL, LDLDIRECT in the last 72 hours. Anemia Panel: No results for input(s): VITAMINB12, FOLATE, FERRITIN, TIBC, IRON, RETICCTPCT in the last 72 hours. Urine analysis:    Component Value Date/Time   COLORURINE YELLOW 12/27/2023 2310   APPEARANCEUR CLOUDY (A) 12/27/2023 2310   LABSPEC 1.008 12/27/2023 2310   PHURINE 5.0 12/27/2023 2310   GLUCOSEU NEGATIVE 12/27/2023 2310   HGBUR SMALL (A) 12/27/2023 2310   BILIRUBINUR  NEGATIVE 12/27/2023 2310   KETONESUR NEGATIVE 12/27/2023 2310   PROTEINUR NEGATIVE 12/27/2023 2310   NITRITE POSITIVE (A) 12/27/2023 2310   LEUKOCYTESUR LARGE (A) 12/27/2023 2310   Sepsis Labs: Invalid input(s): PROCALCITONIN, LACTICIDVEN  Microbiology: Recent Results (from the past 240 hours)  Resp panel by RT-PCR (RSV, Flu A&B, Covid) Anterior Nasal Swab     Status: None   Collection Time: 11/14/24  7:38 AM   Specimen: Anterior Nasal Swab  Result Value Ref Range Status   SARS Coronavirus 2 by RT PCR NEGATIVE NEGATIVE Final   Influenza A by PCR NEGATIVE NEGATIVE Final   Influenza B by PCR NEGATIVE NEGATIVE Final    Comment: (NOTE) The Xpert Xpress SARS-CoV-2/FLU/RSV plus assay is intended as an aid in the diagnosis of influenza from Nasopharyngeal swab specimens and should not be used as a sole basis for treatment. Nasal washings and aspirates are unacceptable for Xpert Xpress SARS-CoV-2/FLU/RSV testing.  Fact Sheet for Patients: bloggercourse.com  Fact Sheet for Healthcare Providers: seriousbroker.it  This test is not yet approved or cleared by the [place]  FDA and has been authorized for detection and/or diagnosis of SARS-CoV-2 by FDA under an Emergency Use Authorization (EUA). This EUA will remain in effect (meaning this test can be used) for the duration of the COVID-19 declaration under Section 564(b)(1) of the Act, 21 U.S.C. section 360bbb-3(b)(1), unless  the authorization is terminated or revoked.     Resp Syncytial Virus by PCR NEGATIVE NEGATIVE Final    Comment: (NOTE) Fact Sheet for Patients: bloggercourse.com  Fact Sheet for Healthcare Providers: seriousbroker.it  This test is not yet approved or cleared by the [place]  FDA and has been authorized for detection and/or diagnosis of SARS-CoV-2 by FDA under an Emergency Use Authorization (EUA). This EUA will remain in effect (meaning this test can be used) for the duration of the COVID-19 declaration under Section 564(b)(1) of the Act, 21 U.S.C. section 360bbb-3(b)(1), unless the authorization is terminated or revoked.  Performed at [facility], [address] [place], [place] [address]   Blood culture (routine x 2)     Status: None (Preliminary result)   Collection Time: 11/14/24  8:57 AM   Specimen: BLOOD  Result Value Ref Range Status   Specimen Description BLOOD SITE NOT SPECIFIED  Final   Special Requests   Final    BOTTLES DRAWN AEROBIC AND ANAEROBIC Blood Culture results may not be optimal due to an inadequate volume of blood received in culture bottles   Culture   Final    NO GROWTH 2 DAYS Performed at [facility], [address] [place], [place] [address]    Report Status PENDING  Incomplete  MRSA Next Gen by PCR, Nasal     Status: Abnormal   Collection Time: 11/14/24  8:58 AM   Specimen: Nasal Mucosa; Nasal Swab  Result Value Ref Range Status   MRSA by PCR Next Gen DETECTED (A) NOT DETECTED Final    Comment: (NOTE) The GeneXpert MRSA Assay (FDA approved for NASAL specimens only), is one component of a comprehensive MRSA colonization surveillance program. It is not intended to diagnose MRSA infection nor to guide or monitor treatment for MRSA infections. Test performance is not FDA approved in patients less than 77 years old. Performed at [facility], [address] [place],  [place] [address]   Blood culture (routine x 2)     Status: None (Preliminary result)   Collection Time: 11/14/24  4:00  PM   Specimen: BLOOD RIGHT HAND  Result Value Ref Range Status   Specimen Description BLOOD RIGHT HAND  Final   Special Requests   Final    BOTTLES DRAWN AEROBIC ONLY Blood Culture adequate volume   Culture   Final    NO GROWTH 2 DAYS Performed at [facility], [address], [place], [place] [address]    Report Status PENDING  Incomplete  MRSA Next Gen by PCR, Nasal     Status: Abnormal   Collection Time: 11/14/24  4:22 PM   Specimen: Nasal Mucosa; Nasal Swab  Result Value Ref Range Status   MRSA by PCR Next Gen DETECTED (A) NOT DETECTED Final    Comment: RESULT CALLED TO, READ BACK BY AND VERIFIED WITH: DOROTHA BATTIEST, RN [phone] 11/15/24 BY VIN (NOTE) The GeneXpert MRSA Assay (FDA approved for NASAL specimens only), is one component of a comprehensive MRSA colonization surveillance program. It is not intended to diagnose MRSA infection nor to guide or monitor treatment for MRSA infections. Test performance is not FDA approved in patients less than 69 years old. Performed at [facility], [address] [place], [place] [address]   Culture, Respiratory w Gram Stain     Status: None (Preliminary result)   Collection Time: 11/14/24  4:47 PM   Specimen: Tracheal Aspirate; Respiratory  Result Value Ref Range Status   Specimen Description   Final    TRACHEAL ASPIRATE Performed at [facility], [address], [place], [place] [address]    Special Requests   Final    NONE Performed at [facility], [address], [place], [place] [address]    Gram Stain   Final    FEW WBC PRESENT, PREDOMINANTLY PMN FEW GRAM POSITIVE COCCI RARE GRAM NEGATIVE RODS    Culture   Final    ABUNDANT PSEUDOMONAS AERUGINOSA MODERATE PROTEUS MIRABILIS SUSCEPTIBILITIES TO FOLLOW Performed at [facility], [address], [place], [place] [address]    Report Status PENDING  Incomplete  Gastrointestinal Panel by PCR , Stool     Status: None   Collection Time: 11/15/24  6:00 PM   Specimen: Stool  Result Value Ref Range Status   Campylobacter species NOT DETECTED NOT DETECTED Final   Plesimonas shigelloides NOT DETECTED NOT DETECTED Final   Salmonella species NOT DETECTED NOT DETECTED Final   Yersinia enterocolitica NOT DETECTED NOT DETECTED Final   Vibrio species NOT DETECTED NOT DETECTED Final   Vibrio cholerae NOT DETECTED NOT DETECTED Final   Enteroaggregative E coli (EAEC) NOT DETECTED NOT DETECTED Final   Enteropathogenic E coli (EPEC) NOT DETECTED NOT DETECTED Final   Enterotoxigenic E coli (ETEC) NOT DETECTED NOT DETECTED Final   Shiga like toxin producing E coli (STEC) NOT DETECTED NOT DETECTED Final   Shigella/Enteroinvasive E coli (EIEC) NOT DETECTED NOT DETECTED Final   Cryptosporidium NOT DETECTED NOT DETECTED Final   Cyclospora cayetanensis NOT DETECTED NOT DETECTED Final   Entamoeba histolytica NOT DETECTED NOT DETECTED Final   Giardia lamblia NOT DETECTED NOT DETECTED Final   Adenovirus F40/41 NOT DETECTED NOT DETECTED Final   Astrovirus NOT DETECTED NOT DETECTED Final   Norovirus GI/GII NOT DETECTED NOT DETECTED Final   Rotavirus A NOT DETECTED NOT DETECTED Final   Sapovirus (I, II, IV, and V) NOT DETECTED NOT DETECTED Final    Comment: Performed at [facility], [address], [place], [place] [address]  C Difficile Quick  Screen w PCR reflex     Status: None   Collection Time: 11/15/24  6:00 PM   Specimen: STOOL  Result Value Ref Range Status   C Diff antigen NEGATIVE NEGATIVE Final   C Diff toxin NEGATIVE NEGATIVE Final   C Diff interpretation No C. difficile detected.  Final    Comment: Performed at [facility], [address], [place], [place] [address]    Radiology Studies: No results found.     Cullen Vanallen T. Amedeo Detweiler [facility]  If 7PM-7AM,  please contact night-coverage [email or web address] 11/17/2024, 11:41 AM   "

## 2024-11-18 DIAGNOSIS — G9340 Encephalopathy, unspecified: Secondary | ICD-10-CM | POA: Diagnosis not present

## 2024-11-18 LAB — CBC
HCT: 32.1 % — ABNORMAL LOW (ref 39.0–52.0)
Hemoglobin: 10.5 g/dL — ABNORMAL LOW (ref 13.0–17.0)
MCH: 29.2 pg (ref 26.0–34.0)
MCHC: 32.7 g/dL (ref 30.0–36.0)
MCV: 89.2 fL (ref 80.0–100.0)
Platelets: 294 10*3/uL (ref 150–400)
RBC: 3.6 MIL/uL — ABNORMAL LOW (ref 4.22–5.81)
RDW: 15.3 % (ref 11.5–15.5)
WBC: 9.4 10*3/uL (ref 4.0–10.5)
nRBC: 0 % (ref 0.0–0.2)

## 2024-11-18 LAB — GLUCOSE, CAPILLARY
Glucose-Capillary: 121 mg/dL — ABNORMAL HIGH (ref 70–99)
Glucose-Capillary: 124 mg/dL — ABNORMAL HIGH (ref 70–99)
Glucose-Capillary: 129 mg/dL — ABNORMAL HIGH (ref 70–99)
Glucose-Capillary: 88 mg/dL (ref 70–99)
Glucose-Capillary: 90 mg/dL (ref 70–99)
Glucose-Capillary: 95 mg/dL (ref 70–99)

## 2024-11-18 LAB — RENAL FUNCTION PANEL
Albumin: 2.8 g/dL — ABNORMAL LOW (ref 3.5–5.0)
Anion gap: 7 (ref 5–15)
BUN: 23 mg/dL (ref 8–23)
CO2: 31 mmol/L (ref 22–32)
Calcium: 8.4 mg/dL — ABNORMAL LOW (ref 8.9–10.3)
Chloride: 101 mmol/L (ref 98–111)
Creatinine, Ser: <0.3 mg/dL — ABNORMAL LOW (ref 0.61–1.24)
Glucose, Bld: 124 mg/dL — ABNORMAL HIGH (ref 70–99)
Phosphorus: 1.8 mg/dL — ABNORMAL LOW (ref 2.5–4.6)
Potassium: 3.9 mmol/L (ref 3.5–5.1)
Sodium: 138 mmol/L (ref 135–145)

## 2024-11-18 LAB — MAGNESIUM: Magnesium: 2.2 mg/dL (ref 1.7–2.4)

## 2024-11-18 MED ORDER — LOSARTAN POTASSIUM 50 MG PO TABS
25.0000 mg | ORAL_TABLET | Freq: Every day | ORAL | Status: DC
  Administered 2024-11-18: 25 mg
  Filled 2024-11-18: qty 1

## 2024-11-18 MED ORDER — PANCRELIPASE (LIP-PROT-AMYL) 10440-39150 UNITS PO TABS
20880.0000 [IU] | ORAL_TABLET | Freq: Once | ORAL | Status: AC
  Administered 2024-11-18: 20880 [IU]
  Filled 2024-11-18: qty 2

## 2024-11-18 MED ORDER — SODIUM BICARBONATE 650 MG PO TABS
650.0000 mg | ORAL_TABLET | Freq: Once | ORAL | Status: AC
  Administered 2024-11-18: 650 mg
  Filled 2024-11-18: qty 1

## 2024-11-18 MED ORDER — SODIUM PHOSPHATES 45 MMOLE/15ML IV SOLN
15.0000 mmol | Freq: Once | INTRAVENOUS | Status: AC
  Administered 2024-11-18: 15 mmol via INTRAVENOUS
  Filled 2024-11-18: qty 5

## 2024-11-18 NOTE — Progress Notes (Signed)
 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 Recently started on diuretics for fluid on his lungs at facility.  In ED, slightly tachycardic and tachypneic.  BP elevated to 194/57.  WBC 23.2 with left shift.  Lactic acid normal.  proBNP 413.  COVID-19, influenza and RSV PCR nonreactive.  CT head without acute finding.  CXR suggested vascular congestion with patchy nodular airspace disease bilaterally and bilateral pleural effusions.  CT chest without contrast concerning for multifocal pneumonia, probable aspirated material within the bronchi bilaterally, but most prominently seen in right mediastinal bronchus.  Cultures obtained.  Started on IV meropenem .  PCCM consulted.    The next day, encephalopathy resolved.  Blood cultures NGTD.  MRSA PCR screen positive.  Trach aspirate culture with pansensitive Pseudomonas and Proteus mirabilis.  Vancomycin  discontinued.  Transitioned to IV cefepime  on 1/26 to continue until 1/30.  Subjective: Seen and examined earlier this morning.  No major events overnight of this morning.  Sleepy but wakes to voice.  Seems to be fairly oriented.  No complaints.  Responds no to pain   Assessment and plan: Sepsis due to ventilator associated pneumonia: Heart tachycardia, tachypnea and leukocytosis.  Patient with chronic vent via  trach.   Seems to be stable from breathing standpoint.  CXR and CT chest suggested multifocal pneumonia.  Blood cultures NGTD.  MRSA PCR screen positive.  Trach aspirate positive for pansensitive Pseudomonas and Proteus. Sepsis physiology resolved. -IV meropenem  1/23>> IV cefepime  1/26-1/30. -Vent management per pulmonology -Continue contact precaution given history of ESBL within the last 12 months. -Patient can go back to [facility] if they can continue IV antibiotics.  TOC notified.  Acute toxic and septic encephalopathy: Multifactorial including sepsis/pneumonia, iatrogenic from multiple sedating meds, and delirium.  CT head without acute finding.  Encephalopathy seems to have resolved. -Reorientation and delirium precaution -Manage sepsis as above.   Chronic respiratory failure s/p vent/trach/PEG - Vent management per PCCM. -Continue tube feed per dietitian.   Essential hypertension: Labile blood pressure.  Slightly elevated this morning. -Continue home amlodipine  with holding parameters. -Add losartan  25 mg daily -P.o. hydralazine  as needed -IV labetalol  as needed  Dysphagia with PEG dependence - Continue tube feed.  BMP glucose within appropriate range.  Chronic back pain -On fentanyl  patch, p.o. Dilaudid , Lyrica    Anxiety/depression/insomnia - Continue home Cymbalta , Ativan  and melatonin  Hypokalemia -Monitor replenish K and Mg as appropriate  Diarrhea: C. difficile negative.  Likely due to tube feed.  C. difficile and GIP negative.  At risk for polypharmacy: Might be contributing to patient's encephalopathy.  He is on fentanyl  patch, Dilaudid , lorazepam , melatonin, Lyrica  and the scopolamine  patch. Difficult situation given his chronic pain and anxiety.   Inadequate oral intake Body mass index is 29.5 kg/m. Nutrition Problem: Inadequate oral intake Etiology: inability to eat (chronic trach to vent) Signs/Symptoms: NPO status (PEG for nutrition) Interventions: Refer to  RD note for  recommendations, Tube feeding  Pressure skin injury: Present on admission. Wound 11/14/24 1511 Pressure Injury Buttocks Right Deep Tissue Pressure Injury - Purple or maroon localized area of discolored intact skin or blood-filled blister due to damage of underlying soft tissue from pressure and/or shear. (Active)     Wound 11/14/24 1511 Pressure Injury Pretibial Proximal;Right Unstageable - Full thickness tissue loss in which the base of the injury is covered by slough (yellow, tan, gray, green or brown) and/or eschar (tan, brown or black) in the wound bed. (Active)   DVT prophylaxis:  enoxaparin  (LOVENOX ) injection 40 mg Start: 11/14/24 2200  Code Status: Full code Family Communication: Updated patient's wife over the phone. Level of care: ICU Status is: Inpatient Remains inpatient appropriate because: Sepsis due to ventilator associated pneumonia, labile blood pressure and encephalopathy   Final disposition: Likely back to [facility]   55 minutes with more than 50% spent in reviewing records, counseling patient/family and coordinating care.  Consultants:  PCCM  Procedures: None  Microbiology summarized: COVID-19, influenza and RSV PCR nonreactive MRSA PCR screen positive Blood cultures NGTD Trach aspirate Gram stain with Pseudomonas and Proteus mirabilis. C. difficile negative GIP negative.  Objective: Vitals:   11/18/24 0700 11/18/24 0752 11/18/24 0800 11/18/24 1020  BP: (!) 141/50  (!) 165/54 (!) 168/57  Pulse: 75  79   Resp: (!) 22  (!) 22   Temp:   100.1 F (37.8 C)   TempSrc:   Axillary   SpO2: 99% 99% 99%   Weight:      Height:        Examination:  GENERAL: No apparent distress.  Nontoxic. HEENT: MMM.  Vision and hearing grossly intact.  NECK: On vent via trach. RESP:  No IWOB.  Fair aeration bilaterally. CVS:  RRR. Heart sounds normal.  ABD/GI/GU: BS+. Abd soft, NTND.  PEG tube in place. MSK/EXT: Quadriplegic. SKIN: no apparent skin lesion  or wound NEURO: Sleepy but wakes to voice.  Seems to be fairly oriented.  Not vocal but seems to respond to yes and no questions appropriately.   Quadriplegia PSYCH: Calm. Normal affect.   Sch Meds:  Scheduled Meds:  amLODipine   10 mg Per NG tube Daily   artificial tears  1 drop Both Eyes Daily   collagenase   1 Application Topical Daily   DULoxetine   30 mg Oral BID   enoxaparin  (LOVENOX ) injection  40 mg Subcutaneous Q24H   feeding supplement (PROSource TF20)  60 mL Per Tube BID   fentaNYL   1 patch Transdermal Q72H   free water   300 mL Per Tube Q4H   furosemide   20 mg Per Tube Daily   guaiFENesin   10 mL Per Tube TID   ipratropium-albuterol   3 mL Nebulization Q6H   lipase/protease/amylase  12,000 Units Oral Once   loratadine   10 mg Per Tube Daily   melatonin  10 mg Per Tube QHS   mupirocin  ointment  1 Application Nasal BID   mouth rinse  15 mL Mouth Rinse Q2H   pantoprazole  (PROTONIX ) IV  40 mg Intravenous Q24H   pregabalin   100 mg Per Tube TID   scopolamine   1 patch Transdermal Q72H   Continuous Infusions:  ceFEPime  (MAXIPIME ) IV Stopped (11/18/24 0604)   feeding supplement (OSMOLITE 1.5 CAL) 50 mL/hr (11/18/24 0703)   sodium PHOSPHATE  IVPB (in mmol) 15 mmol (11/18/24 0808)   PRN Meds:.acetaminophen  (TYLENOL ) oral liquid 160 mg/5 mL, hydrALAZINE , HYDROmorphone , labetalol , lip balm, LORazepam , ondansetron , mouth rinse, polyethylene glycol  Antimicrobials: Anti-infectives (From  admission, onward)    Start     Dose/Rate Route Frequency Ordered Stop   11/17/24 2200  ceFEPIme  (MAXIPIME ) 2 g in sodium chloride  0.9 % 100 mL IVPB        2 g 200 mL/hr over 30 Minutes Intravenous Every 8 hours 11/17/24 1411 11/21/24 0559   11/15/24 2000  vancomycin  (VANCOREADY) IVPB 1250 mg/250 mL  Status:  Discontinued        1,250 mg 166.7 mL/hr over 90 Minutes Intravenous Every 12 hours 11/15/24 0817 11/17/24 0956   11/15/24 0815  vancomycin  (VANCOREADY) IVPB 2000 mg/400 mL        2,000 mg 200  mL/hr over 120 Minutes Intravenous  Once 11/15/24 0719 11/15/24 0959   11/14/24 1400  meropenem  (MERREM ) 1 g in sodium chloride  0.9 % 100 mL IVPB  Status:  Discontinued        1 g 200 mL/hr over 30 Minutes Intravenous Every 8 hours 11/14/24 1324 11/17/24 1411   11/14/24 0930  piperacillin -tazobactam (ZOSYN ) IVPB 3.375 g        3.375 g 100 mL/hr over 30 Minutes Intravenous  Once 11/14/24 0925 11/14/24 1017        I have personally reviewed the following labs and images: CBC: Recent Labs  Lab 11/14/24 1600 11/15/24 0236 11/16/24 0753 11/17/24 0106 11/18/24 0305  WBC 20.7* 12.7* 12.1* 9.1 9.4  HGB 13.1 11.1* 11.3* 10.2* 10.5*  HCT 41.5 34.7* 36.5* 31.4* 32.1*  MCV 90.6 89.4 90.6 89.2 89.2  PLT 357 303 325 309 294   BMP &GFR Recent Labs  Lab 11/15/24 0236 11/16/24 0753 11/17/24 0106 11/17/24 0108 11/18/24 0305  NA 138 140 138 138 138  K 3.4* 3.8 3.3* 3.3* 3.9  CL 98 101 101 101 101  CO2 33* 33* 30 31 31   GLUCOSE 139* 158* 101* 101* 124*  BUN 17 16 20 20 23   CREATININE <0.30* <0.30* <0.30* <0.30* <0.30*  CALCIUM 9.0 8.8* 8.2* 8.2* 8.4*  MG 2.4 2.4 2.1  --  2.2  PHOS  --  2.7 2.0*  --  1.8*   CrCl cannot be calculated (This lab value cannot be used to calculate CrCl because it is not a number: <0.30). Liver & Pancreas: Recent Labs  Lab 11/14/24 0737 11/16/24 0753 11/17/24 0106 11/18/24 0305  AST 21  --   --   --   ALT 16  --   --   --   ALKPHOS 149*  --   --   --   BILITOT 0.6  --   --   --   PROT 9.0*  --   --   --   ALBUMIN 3.6 3.1* 2.8* 2.8*   No results for input(s): LIPASE, AMYLASE in the last 168 hours. Recent Labs  Lab 11/17/24 0106  AMMONIA 31   Diabetic: No results for input(s): HGBA1C in the last 72 hours. Recent Labs  Lab 11/17/24 0743 11/17/24 1547 11/17/24 1922 11/18/24 0434 11/18/24 0712  GLUCAP 135* 114* 116* 129* 121*   Cardiac Enzymes: No results for input(s): CKTOTAL, CKMB, CKMBINDEX, TROPONINI in the last 168  hours. Recent Labs    11/14/24 0857  PROBNP 413.0*   Coagulation Profile: No results for input(s): INR, PROTIME in the last 168 hours. Thyroid Function Tests: No results for input(s): TSH, T4TOTAL, FREET4, T3FREE, THYROIDAB in the last 72 hours. Lipid Profile: No results for input(s): CHOL, HDL, LDLCALC, TRIG, CHOLHDL, LDLDIRECT in the last 72 hours. Anemia Panel: No results for input(s):  VITAMINB12, FOLATE, FERRITIN, TIBC, IRON, RETICCTPCT in the last 72 hours. Urine analysis:    Component Value Date/Time   COLORURINE YELLOW 12/27/2023 2310   APPEARANCEUR CLOUDY (A) 12/27/2023 2310   LABSPEC 1.008 12/27/2023 2310   PHURINE 5.0 12/27/2023 2310   GLUCOSEU NEGATIVE 12/27/2023 2310   HGBUR SMALL (A) 12/27/2023 2310   BILIRUBINUR NEGATIVE 12/27/2023 2310   KETONESUR NEGATIVE 12/27/2023 2310   PROTEINUR NEGATIVE 12/27/2023 2310   NITRITE POSITIVE (A) 12/27/2023 2310   LEUKOCYTESUR LARGE (A) 12/27/2023 2310   Sepsis Labs: Invalid input(s): PROCALCITONIN, LACTICIDVEN  Microbiology: Recent Results (from the past 240 hours)  Resp panel by RT-PCR (RSV, Flu A&B, Covid) Anterior Nasal Swab     Status: None   Collection Time: 11/14/24  7:38 AM   Specimen: Anterior Nasal Swab  Result Value Ref Range Status   SARS Coronavirus 2 by RT PCR NEGATIVE NEGATIVE Final   Influenza A by PCR NEGATIVE NEGATIVE Final   Influenza B by PCR NEGATIVE NEGATIVE Final    Comment: (NOTE) The Xpert Xpress SARS-CoV-2/FLU/RSV plus assay is intended as an aid in the diagnosis of influenza from Nasopharyngeal swab specimens and should not be used as a sole basis for treatment. Nasal washings and aspirates are unacceptable for Xpert Xpress SARS-CoV-2/FLU/RSV testing.  Fact Sheet for Patients: bloggercourse.com  Fact Sheet for Healthcare Providers: seriousbroker.it  This test is not yet approved or cleared by the  [place]  FDA and has been authorized for detection and/or diagnosis of SARS-CoV-2 by FDA under an Emergency Use Authorization (EUA). This EUA will remain in effect (meaning this test can be used) for the duration of the COVID-19 declaration under Section 564(b)(1) of the Act, 21 U.S.C. section 360bbb-3(b)(1), unless the authorization is terminated or revoked.     Resp Syncytial Virus by PCR NEGATIVE NEGATIVE Final    Comment: (NOTE) Fact Sheet for Patients: bloggercourse.com  Fact Sheet for Healthcare Providers: seriousbroker.it  This test is not yet approved or cleared by the [place]  FDA and has been authorized for detection and/or diagnosis of SARS-CoV-2 by FDA under an Emergency Use Authorization (EUA). This EUA will remain in effect (meaning this test can be used) for the duration of the COVID-19 declaration under Section 564(b)(1) of the Act, 21 U.S.C. section 360bbb-3(b)(1), unless the authorization is terminated or revoked.  Performed at [facility], [address], [place], [place] [address]   Blood culture (routine x 2)     Status: None (Preliminary result)   Collection Time: 11/14/24  8:57 AM   Specimen: BLOOD  Result Value Ref Range Status   Specimen Description BLOOD SITE NOT SPECIFIED  Final   Special Requests   Final    BOTTLES DRAWN AEROBIC AND ANAEROBIC Blood Culture results may not be optimal due to an inadequate volume of blood received in culture bottles   Culture   Final    NO GROWTH 4 DAYS Performed at [facility], [address], [place], [place] [address]    Report Status PENDING  Incomplete  MRSA Next Gen by PCR, Nasal     Status: Abnormal   Collection Time: 11/14/24  8:58 AM   Specimen: Nasal Mucosa; Nasal Swab  Result Value Ref Range Status   MRSA by PCR Next Gen DETECTED (A) NOT DETECTED Final    Comment: (NOTE) The GeneXpert MRSA Assay (FDA approved for NASAL specimens  only), is one component of a comprehensive MRSA colonization surveillance program. It is not intended to  diagnose MRSA infection nor to guide or monitor treatment for MRSA infections. Test performance is not FDA approved in patients less than 76 years old. Performed at [facility], [address] [place], [place] [address]   Blood culture (routine x 2)     Status: None (Preliminary result)   Collection Time: 11/14/24  4:00 PM   Specimen: BLOOD RIGHT HAND  Result Value Ref Range Status   Specimen Description BLOOD RIGHT HAND  Final   Special Requests   Final    BOTTLES DRAWN AEROBIC ONLY Blood Culture adequate volume   Culture   Final    NO GROWTH 4 DAYS Performed at [facility], [address], [place], [place] [address]    Report Status PENDING  Incomplete  MRSA Next Gen by PCR, Nasal     Status: Abnormal   Collection Time: 11/14/24  4:22 PM   Specimen: Nasal Mucosa; Nasal Swab  Result Value Ref Range Status   MRSA by PCR Next Gen DETECTED (A) NOT DETECTED Final    Comment: RESULT CALLED TO, READ BACK BY AND VERIFIED WITH: DOROTHA BATTIEST, RN [phone] 11/15/24 BY VIN (NOTE) The GeneXpert MRSA Assay (FDA approved for NASAL specimens only), is one component of a comprehensive MRSA colonization surveillance program. It is not intended to diagnose MRSA infection nor to guide or monitor treatment for MRSA infections. Test performance is not FDA approved in patients less than 57 years old. Performed at [facility], [address] [place], [place] [address]   Culture, Respiratory w Gram Stain     Status: None   Collection Time: 11/14/24  4:47 PM   Specimen: Tracheal Aspirate; Respiratory  Result Value Ref Range Status   Specimen Description   Final    TRACHEAL ASPIRATE Performed at [facility], [address], [place], [place] [address]    Special Requests   Final    NONE Performed at [facility], [address], [place], [place] [address]    Gram Stain   Final    FEW WBC PRESENT, PREDOMINANTLY PMN FEW GRAM POSITIVE COCCI RARE GRAM NEGATIVE RODS Performed at [facility], [address], [place], [place] [address]    Culture   Final    ABUNDANT PSEUDOMONAS AERUGINOSA MODERATE PROTEUS MIRABILIS    Report Status 11/17/2024 FINAL  Final   Organism ID, Bacteria PSEUDOMONAS AERUGINOSA  Final   Organism ID, Bacteria PROTEUS MIRABILIS  Final      Susceptibility   Pseudomonas aeruginosa - MIC*    MEROPENEM  1 SENSITIVE Sensitive     CIPROFLOXACIN 0.25 SENSITIVE Sensitive     IMIPENEM 2 SENSITIVE Sensitive     PIP/TAZO Value in next row Sensitive      <=4 SENSITIVEThis is a modified FDA-approved test that has been validated and its performance characteristics determined by the reporting laboratory.  This laboratory is certified under the Clinical Laboratory Improvement Amendments CLIA as qualified to perform high complexity clinical laboratory testing.    CEFEPIME  Value in next row Sensitive      <=4 SENSITIVEThis is a modified FDA-approved test that has been validated and its performance characteristics determined by the reporting laboratory.  This laboratory is certified under the Clinical Laboratory Improvement Amendments CLIA as qualified to perform high complexity clinical laboratory testing.    CEFTAZIDIME/AVIBACTAM Value in next row Sensitive      <=4 SENSITIVEThis is a modified FDA-approved test that has been validated and  its performance characteristics determined by the reporting laboratory.  This laboratory is certified under the Clinical Laboratory Improvement Amendments CLIA as qualified to perform high complexity clinical laboratory testing.    CEFTOLOZANE/TAZOBACTAM Value in next row Sensitive      <=4 SENSITIVEThis is a modified FDA-approved test that has been validated and its performance characteristics determined by the reporting laboratory.  This laboratory is certified  under the Clinical Laboratory Improvement Amendments CLIA as qualified to perform high complexity clinical laboratory testing.    TOBRAMYCIN Value in next row Sensitive      <=4 SENSITIVEThis is a modified FDA-approved test that has been validated and its performance characteristics determined by the reporting laboratory.  This laboratory is certified under the Clinical Laboratory Improvement Amendments CLIA as qualified to perform high complexity clinical laboratory testing.    CEFTAZIDIME Value in next row Sensitive      <=4 SENSITIVEThis is a modified FDA-approved test that has been validated and its performance characteristics determined by the reporting laboratory.  This laboratory is certified under the Clinical Laboratory Improvement Amendments CLIA as qualified to perform high complexity clinical laboratory testing.    * ABUNDANT PSEUDOMONAS AERUGINOSA   Proteus mirabilis - MIC*    AMPICILLIN Value in next row Sensitive      <=4 SENSITIVEThis is a modified FDA-approved test that has been validated and its performance characteristics determined by the reporting laboratory.  This laboratory is certified under the Clinical Laboratory Improvement Amendments CLIA as qualified to perform high complexity clinical laboratory testing.    CEFAZOLIN (NON-URINE) Value in next row Sensitive      <=4 SENSITIVEThis is a modified FDA-approved test that has been validated and its performance characteristics determined by the reporting laboratory.  This laboratory is certified under the Clinical Laboratory Improvement Amendments CLIA as qualified to perform high complexity clinical laboratory testing.    CEFEPIME  Value in next row Sensitive      <=4 SENSITIVEThis is a modified FDA-approved test that has been validated and its performance characteristics determined by the reporting laboratory.  This laboratory is certified under the Clinical Laboratory Improvement Amendments CLIA as qualified to perform high  complexity clinical laboratory testing.    ERTAPENEM  Value in next row Sensitive      <=4 SENSITIVEThis is a modified FDA-approved test that has been validated and its performance characteristics determined by the reporting laboratory.  This laboratory is certified under the Clinical Laboratory Improvement Amendments CLIA as qualified to perform high complexity clinical laboratory testing.    CEFTRIAXONE  Value in next row Sensitive      <=4 SENSITIVEThis is a modified FDA-approved test that has been validated and its performance characteristics determined by the reporting laboratory.  This laboratory is certified under the Clinical Laboratory Improvement Amendments CLIA as qualified to perform high complexity clinical laboratory testing.    CIPROFLOXACIN Value in next row Sensitive      <=4 SENSITIVEThis is a modified FDA-approved test that has been validated and its performance characteristics determined by the reporting laboratory.  This laboratory is certified under the Clinical Laboratory Improvement Amendments CLIA as qualified to perform high complexity clinical laboratory testing.    GENTAMICIN Value in next row Sensitive      <=4 SENSITIVEThis is a modified FDA-approved test that has been validated and its performance characteristics determined by the reporting laboratory.  This laboratory is certified under the Clinical Laboratory Improvement Amendments CLIA as qualified to perform high complexity clinical laboratory testing.    MEROPENEM  Value  in next row Sensitive      <=4 SENSITIVEThis is a modified FDA-approved test that has been validated and its performance characteristics determined by the reporting laboratory.  This laboratory is certified under the Clinical Laboratory Improvement Amendments CLIA as qualified to perform high complexity clinical laboratory testing.    TRIMETH /SULFA  Value in next row Resistant      <=4 SENSITIVEThis is a modified FDA-approved test that has been validated  and its performance characteristics determined by the reporting laboratory.  This laboratory is certified under the Clinical Laboratory Improvement Amendments CLIA as qualified to perform high complexity clinical laboratory testing.    AMPICILLIN/SULBACTAM Value in next row Sensitive      <=4 SENSITIVEThis is a modified FDA-approved test that has been validated and its performance characteristics determined by the reporting laboratory.  This laboratory is certified under the Clinical Laboratory Improvement Amendments CLIA as qualified to perform high complexity clinical laboratory testing.    PIP/TAZO Value in next row Sensitive      <=4 SENSITIVEThis is a modified FDA-approved test that has been validated and its performance characteristics determined by the reporting laboratory.  This laboratory is certified under the Clinical Laboratory Improvement Amendments CLIA as qualified to perform high complexity clinical laboratory testing.    * MODERATE PROTEUS MIRABILIS  Gastrointestinal Panel by PCR , Stool     Status: None   Collection Time: 11/15/24  6:00 PM   Specimen: Stool  Result Value Ref Range Status   Campylobacter species NOT DETECTED NOT DETECTED Final   Plesimonas shigelloides NOT DETECTED NOT DETECTED Final   Salmonella species NOT DETECTED NOT DETECTED Final   Yersinia enterocolitica NOT DETECTED NOT DETECTED Final   Vibrio species NOT DETECTED NOT DETECTED Final   Vibrio cholerae NOT DETECTED NOT DETECTED Final   Enteroaggregative E coli (EAEC) NOT DETECTED NOT DETECTED Final   Enteropathogenic E coli (EPEC) NOT DETECTED NOT DETECTED Final   Enterotoxigenic E coli (ETEC) NOT DETECTED NOT DETECTED Final   Shiga like toxin producing E coli (STEC) NOT DETECTED NOT DETECTED Final   Shigella/Enteroinvasive E coli (EIEC) NOT DETECTED NOT DETECTED Final   Cryptosporidium NOT DETECTED NOT DETECTED Final   Cyclospora cayetanensis NOT DETECTED NOT DETECTED Final   Entamoeba histolytica NOT  DETECTED NOT DETECTED Final   Giardia lamblia NOT DETECTED NOT DETECTED Final   Adenovirus F40/41 NOT DETECTED NOT DETECTED Final   Astrovirus NOT DETECTED NOT DETECTED Final   Norovirus GI/GII NOT DETECTED NOT DETECTED Final   Rotavirus A NOT DETECTED NOT DETECTED Final   Sapovirus (I, II, IV, and V) NOT DETECTED NOT DETECTED Final    Comment: Performed at [facility], [address] [place], [place] [address]  C Difficile Quick Screen w PCR reflex     Status: None   Collection Time: 11/15/24  6:00 PM   Specimen: STOOL  Result Value Ref Range Status   C Diff antigen NEGATIVE NEGATIVE Final   C Diff toxin NEGATIVE NEGATIVE Final   C Diff interpretation No C. difficile detected.  Final    Comment: Performed at [facility], [address], [place], [place] [address]    Radiology Studies: No results found.     Izick [place] T. Talvin Christianson [facility]  If 7PM-7AM, please contact night-coverage [email or web address] 11/18/2024, 11:05 AM   "

## 2024-11-18 NOTE — TOC Progression Note (Signed)
 Transition of Care [facility]) - Progression Note    Patient Details  Name: Andrew Rivera MRN: [id number] Date of Birth: [birth year]  [facility] CM/SW Contact  NORMAN ASPEN, LCSW Phone Number: 11/18/2024, 2:09 PM  Clinical Narrative:     Alerted by MD that medically ready to return to [facility] is they can provide the IVabx.  Confirmed with [facility] with [facility] that they can supply antibiotics and can readmit patient tomorrow.  Will need dc summary by 9am.  MD aware.  Expected Discharge Plan: Long Term Acute Care (LTAC) Barriers to Discharge: Continued Medical Work up               Expected Discharge Plan and Services In-house Referral: NA Discharge Planning Services: CM Consult Post Acute Care Choice: Long Term Acute Care (LTAC) Living arrangements for the past 2 months: Skilled Nursing Facility ([facility])                 DME Arranged: N/A DME Agency: NA       HH Arranged: NA HH Agency: NA         Social Drivers of Health (SDOH) Interventions SDOH Screenings   Food Insecurity: Patient Unable To Answer (11/15/2024)  Housing: Unknown (11/15/2024)  Transportation Needs: Patient Unable To Answer (11/15/2024)  Utilities: Patient Unable To Answer (11/15/2024)  Social Connections: Patient Unable To Answer (11/15/2024)  Tobacco Use: Medium Risk (11/14/2024)    Readmission Risk Interventions    11/17/2024   11:44 AM 01/08/2024    9:50 AM 09/10/2023    3:49 PM  Readmission Risk Prevention Plan  Transportation Screening Complete Complete Complete  PCP or Specialist Appt within 5-7 Days   Complete  PCP or Specialist Appt within 3-5 Days Complete Complete   Home Care Screening   Complete  Medication Review (RN CM)   Complete  HRI or Home Care Consult Complete Complete   Social Work Consult for Recovery Care Planning/Counseling Complete Complete   Palliative Care Screening Not Applicable Not Applicable   Medication Review Oceanographer) Complete  Referral to Pharmacy

## 2024-11-18 NOTE — Plan of Care (Incomplete)

## 2024-11-19 LAB — CULTURE, BLOOD (ROUTINE X 2)
Culture: NO GROWTH
Culture: NO GROWTH
Special Requests: ADEQUATE

## 2024-11-19 LAB — GLUCOSE, CAPILLARY
Glucose-Capillary: 152 mg/dL — ABNORMAL HIGH (ref 70–99)
Glucose-Capillary: 141 mg/dL — ABNORMAL HIGH (ref 70–99)

## 2024-11-19 MED ORDER — SODIUM CHLORIDE 0.9 % IV SOLN: 2.0000 g | Freq: Three times a day (TID) | INTRAVENOUS | Status: AC

## 2024-11-19 NOTE — Progress Notes (Signed)
 This RN has attempted to call pt's wife, Arland, on multiple occasions to communicate to her that pt is being transferred to [facility]. At this time, pt has left the building via carelink and appeared to be in no apparent distress upon discharge. 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

## 2024-11-19 NOTE — Discharge Summary (Signed)
 "  Physician Discharge Summary  Andrew Rivera FMW:[id number] DOB: September 24, 1951 DOA: 11/14/2024  PCP: Carolee Darin BRAVO, MD  Admit date: 11/14/2024 Discharge date: 11/19/24  [facility]: [facility] Recommendations for Outpatient Follow-up:  Continue IV cefepime  for 3 more days. Check CMP and CBC in 1 week Patient is at risk for polypharmacy on multiple sedating medications.  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 Recently started on diuretics for fluid on his lungs at facility.   In ED, slightly tachycardic and tachypneic.  BP elevated to 194/57.  WBC 23.2 with left shift.  Lactic acid normal.  proBNP 413.  COVID-19, influenza and RSV PCR nonreactive.  CT head without acute finding.  CXR suggested vascular congestion with patchy nodular airspace disease bilaterally and bilateral pleural effusions.   CT chest without contrast concerning for multifocal pneumonia, probable aspirated material within the bronchi bilaterally, but most prominently seen in right mediastinal bronchus.  Cultures obtained.  Started on IV meropenem .  PCCM consulted.    The next day, encephalopathy resolved.  Blood cultures NGTD.  MRSA PCR screen positive.  Trach aspirate culture with pansensitive Pseudomonas and Proteus mirabilis.  Vancomycin  discontinued.  Transitioned to IV cefepime  on 1/26 to continue until 1/30.  See individual problem list below for more.    Problems addressed during this hospitalization Sepsis due to ventilator associated pneumonia: Heart tachycardia, tachypnea and leukocytosis.  Patient with chronic vent via trach.   Seems to be stable from breathing standpoint.  CXR and CT chest suggested multifocal pneumonia.  Blood cultures NGTD.  MRSA PCR screen positive.  Trach aspirate positive for pansensitive Pseudomonas and Proteus. Sepsis physiology resolved. -IV meropenem  1/23>> IV cefepime  1/26-1/30. -Vent management per pulmonology -Continue contact precaution given history of ESBL within the last 12 months.   Acute toxic and septic encephalopathy: Multifactorial including sepsis/pneumonia, iatrogenic from multiple sedating meds, and delirium.  CT head without acute finding.  Encephalopathy seems to have resolved. -Reorientation and delirium precaution -Manage sepsis as above.   Chronic respiratory failure s/p vent/trach/PEG -Continue vent per prior setting -Continue tube feed per dietitian.   Essential hypertension: Labile blood pressure.  Slightly elevated this morning. -Continue home amlodipine  with holding parameters.   Dysphagia with PEG dependence - Continue tube feed.  BMP glucose within appropriate range.   Chronic back pain -On fentanyl  patch, p.o. Dilaudid , Lyrica    Anxiety/depression/insomnia - Continue home Cymbalta , Ativan  and melatonin   Hypokalemia: Resolved.   Diarrhea: C. difficile negative.   C. difficile and GIP negative. Likely due to tube feed.  Seems to have resolved.   At risk for polypharmacy: Might be contributing to patient's encephalopathy.  He is on fentanyl  patch, Dilaudid , lorazepam , melatonin, Lyrica  and the scopolamine  patch. Difficult situation given his chronic pain and anxiety.    Inadequate oral intake/class I obesity Body mass index is 30.31 kg/m. Nutrition Problem: Inadequate oral intake Etiology: inability to eat (chronic trach to vent) Signs/Symptoms: NPO status (PEG for  nutrition) Interventions: Refer to RD note for recommendations, Tube feeding  Pressure  skin injury: Present on admission.  -See wound care instruction below Wound 11/14/24 1511 Pressure Injury Buttocks Right Deep Tissue Pressure Injury - Purple or maroon localized area of discolored intact skin or blood-filled blister due to damage of underlying soft tissue from pressure and/or shear. (Active)     Wound 11/14/24 1511 Pressure Injury Pretibial Proximal;Right Unstageable - Full thickness tissue loss in which the base of the injury is covered by slough (yellow, tan, gray, green or brown) and/or eschar (tan, brown or black) in the wound bed. (Active)    Consultations: Critical care  Time spent 35  minutes  Vital signs Vitals:   11/19/24 0104 11/19/24 0155 11/19/24 0355 11/19/24 0451  BP:      Pulse: 62   83  Temp:   97.8 F (36.6 C)   Resp: (!) 22   (!) 22  Height:      Weight:      SpO2: 100% 100%  99%  TempSrc:   Oral   BMI (Calculated):         Discharge exam  GENERAL: No apparent distress.  Nontoxic. HEENT: MMM.  Vision and hearing grossly intact.  NECK: On vent via trach. RESP:  No IWOB.  Fair aeration bilaterally. CVS:  RRR. Heart sounds normal.  ABD/GI/GU: BS+. Abd soft, NTND.  PEG tube in place. MSK/EXT: Quadriplegic. SKIN: no apparent skin lesion or wound NEURO: Sleepy but wakes to voice.  Seems to be fairly oriented.  Not vocal but seems to respond to yes and no questions appropriately.   Quadriplegia PSYCH: Calm. Normal affect.   Discharge Instructions Discharge Instructions     Discharge wound care:   Complete by: As directed    Wound care  Daily      1.  Cleanse R buttocks  and R posterior thigh wounds with Vashe, do not rinse.  Apply Xeroform gauze (TI#[id number]) to wound beds daily and secure with silicone foam.   2.         Cleanse R lower leg wound with Vashe, do not rinse.  Apply 1/4 thick layer of Santyl  to wound bed daily, cover with saline moist  gauze, dry gauze and secure with silicone foam or Kerlix roll gauze whichever is preferred.  11/16/24 1752   Increase activity slowly   Complete by: As directed       Allergies as of 11/19/2024       Reactions   Chlorhexidine  Itching, Rash   Codeine Nausea Only   Oxycodone-acetaminophen  Nausea Only   Other reaction(s): Hallucinations        Medication List     TAKE these medications    acetaminophen  325 MG tablet Commonly known as: TYLENOL  Place 975 mg into feeding tube in the morning and at bedtime.   amLODipine  10 MG tablet Commonly known as: NORVASC  Place 1 tablet (10 mg total) into feeding tube daily.   carboxymethylcellulose 0.5 % Soln Commonly known as: REFRESH PLUS Place 1 drop into both eyes daily.   ceFEPIme  2 g in sodium chloride  0.9 % 100 mL Inject 2 g into the vein every 8 (eight) hours for 3 days.   DULoxetine  30 MG capsule Commonly known as: CYMBALTA  30 mg 2 (two) times daily. Per tube   esomeprazole  40 MG packet Commonly known as: NEXIUM  40 mg daily. Per tube   feeding supplement (OSMOLITE 1.5 CAL) Liqd Place 50 mL/hr into feeding tube continuous. What changed: how much to take   feeding supplement (PROSource TF20) liquid Place 60  mLs into feeding tube daily.   fentaNYL  25 MCG/HR Commonly known as: DURAGESIC  Place 1 patch onto the skin every 3 (three) days.   free water  Soln Place 300 mLs into feeding tube every 4 (four) hours.   furosemide  20 MG tablet Commonly known as: LASIX  Place 20 mg into feeding tube daily.   guaiFENesin  100 MG/5ML liquid Commonly known as: ROBITUSSIN Place 10 mLs into feeding tube 3 (three) times daily.   HYDROmorphone  2 MG tablet Commonly known as: DILAUDID  Place 0.5 tablets (1 mg total) into feeding tube every 8 (eight) hours as needed for severe pain (pain score 7-10).   ipratropium-albuterol  0.5-2.5 (3) MG/3ML Soln Commonly known as: DUONEB Take 3 mLs by nebulization every 6 (six) hours.    levocetirizine 5 MG tablet Commonly known as: XYZAL  Place 5 mg into feeding tube every evening.   loperamide 2 MG tablet Commonly known as: IMODIUM A-D Take 2 mg by mouth every 6 (six) hours as needed for diarrhea or loose stools.   LORazepam  0.5 MG tablet Commonly known as: ATIVAN  Place 1 tablet (0.5 mg total) into feeding tube every 12 (twelve) hours as needed for anxiety.   Melatonin 10 MG Tabs Give 10 mg by tube at bedtime.   ondansetron  4 MG disintegrating tablet Commonly known as: ZOFRAN -ODT Take 4 mg by mouth every 8 (eight) hours as needed for nausea or vomiting.   polyethylene glycol 17 g packet Commonly known as: MIRALAX  / GLYCOLAX  Place 17 g into feeding tube 2 (two) times daily. What changed:  when to take this reasons to take this   pregabalin  100 MG capsule Commonly known as: LYRICA  Place 1 capsule (100 mg total) into feeding tube 3 (three) times daily.   PROBIOTIC PO Place 1 capsule into feeding tube daily.   scopolamine  1 MG/3DAYS Commonly known as: TRANSDERM-SCOP Place 1 patch onto the skin every 3 (three) days.               Discharge Care Instructions  (From admission, onward)           Start     Ordered   11/19/24 0000  Discharge wound care:       Comments: Wound care  Daily      1.  Cleanse R buttocks  and R posterior thigh wounds with Vashe, do not rinse.  Apply Xeroform gauze (TI#[id number]) to wound beds daily and secure with silicone foam.   2.         Cleanse R lower leg wound with Vashe, do not rinse.  Apply 1/4 thick layer of Santyl  to wound bed daily, cover with saline moist gauze, dry gauze and secure with silicone foam or Kerlix roll gauze whichever is preferred.  11/16/24 1752   11/19/24 0745             Procedures/Studies:   CT CHEST WO CONTRAST Result Date: 11/14/2024 CLINICAL DATA:  Shortness of breath EXAM: CT CHEST WITHOUT CONTRAST TECHNIQUE: Multidetector CT imaging of the chest was performed following the  standard protocol without IV contrast. RADIATION DOSE REDUCTION: This exam was performed according to the departmental dose-optimization program which includes automated exposure control, adjustment of the mA and/or kV according to patient size and/or use of iterative reconstruction technique. COMPARISON:  July 04, 2024 FINDINGS: Cardiovascular: Aortic atherosclerosis without aneurysm formation. Mild cardiomegaly is noted. No pericardial effusion. Coronary artery calcifications are noted. Mediastinum/Nodes: Tracheostomy is in good position. No adenopathy. Thyroid gland is unremarkable. Esophagus is unremarkable. Lungs/Pleura: No  pneumothorax is noted. Bilateral lower lobe airspace opacities are noted concerning for pneumonia, right greater than left. Patchy airspace opacities are noted in left upper lobe concerning for multifocal pneumonia. Minimal right middle lobe subsegmental atelectasis is noted. There is again noted probable aspirated material within the bronchi bilaterally, but most prominently seen in right mainstem bronchus. Upper Abdomen: No acute abnormality. Musculoskeletal: No chest wall mass or suspicious bone lesions identified. IMPRESSION: 1. Bilateral lower lobe airspace opacities are noted concerning for pneumonia, right greater than left. Patchy airspace opacities are noted in left upper lobe concerning for multifocal pneumonia. 2. There is again noted probable aspirated material within the bronchi bilaterally, but most prominently seen in right mainstem bronchus. 3. Coronary artery calcifications are noted. 4. Aortic atherosclerosis. Aortic Atherosclerosis (ICD10-I70.0). Electronically Signed   By: Lynwood Landy Raddle M.D.   On: 11/14/2024 14:49   CT Head Wo Contrast Result Date: 11/14/2024 EXAM: CT HEAD WITHOUT CONTRAST 11/14/2024 11:13:33 AM TECHNIQUE: CT of the head was performed without the administration of intravenous contrast. Automated exposure control, iterative reconstruction, and/or  weight based adjustment of the mA/kV was utilized to reduce the radiation dose to as low as reasonably achievable. COMPARISON: CT head 06/15/2022. CLINICAL HISTORY: Delirium. FINDINGS: BRAIN AND VENTRICLES: No acute hemorrhage. No evidence of acute infarct. No hydrocephalus. No extra-axial collection. No mass effect or midline shift. ORBITS: No acute abnormality. SINUSES: No acute abnormality. SOFT TISSUES AND SKULL: No acute soft tissue abnormality. No skull fracture. IMPRESSION: 1. No acute intracranial abnormality. Electronically signed by: Ryan Chess MD 11/14/2024 11:18 AM EST RP Workstation: [id number]   DG Chest Port 1 View Result Date: 11/14/2024 CLINICAL DATA:  Shortness of breath.  Ventilator dependence. EXAM: PORTABLE CHEST 1 VIEW COMPARISON:  07/04/2024 FINDINGS: Tracheostomy tube remains in place. The cardio pericardial silhouette is enlarged. Vascular congestion with patchy and nodular airspace disease bilaterally. Bilateral pleural effusions evident. Telemetry leads overlie the chest. IMPRESSION: 1. Vascular congestion with patchy and nodular airspace disease bilaterally. Findings may reflect infectious/inflammatory etiology but close follow-up warranted. 2. Bilateral pleural effusions. Electronically Signed   By: Camellia Candle M.D.   On: 11/14/2024 07:24       The results of significant diagnostics from this hospitalization (including imaging, microbiology, ancillary and laboratory) are listed below for reference.     Microbiology: Recent Results (from the past 240 hours)  Resp panel by RT-PCR (RSV, Flu A&B, Covid) Anterior Nasal Swab     Status: None   Collection Time: 11/14/24  7:38 AM   Specimen: Anterior Nasal Swab  Result Value Ref Range Status   SARS Coronavirus 2 by RT PCR NEGATIVE NEGATIVE Final   Influenza A by PCR NEGATIVE NEGATIVE Final   Influenza B by PCR NEGATIVE NEGATIVE Final    Comment: (NOTE) The Xpert Xpress SARS-CoV-2/FLU/RSV plus assay is intended as an  aid in the diagnosis of influenza from Nasopharyngeal swab specimens and should not be used as a sole basis for treatment. Nasal washings and aspirates are unacceptable for Xpert Xpress SARS-CoV-2/FLU/RSV testing.  Fact Sheet for Patients: bloggercourse.com  Fact Sheet for Healthcare Providers: seriousbroker.it  This test is not yet approved or cleared by the [place]  FDA and has been authorized for detection and/or diagnosis of SARS-CoV-2 by FDA under an Emergency Use Authorization (EUA). This EUA will remain in effect (meaning this test can be used) for the duration of the COVID-19 declaration under Section 564(b)(1) of the Act, 21 U.S.C. section 360bbb-3(b)(1), unless the authorization is terminated or  revoked.     Resp Syncytial Virus by PCR NEGATIVE NEGATIVE Final    Comment: (NOTE) Fact Sheet for Patients: bloggercourse.com  Fact Sheet for Healthcare Providers: seriousbroker.it  This test is not yet approved or cleared by the [place]  FDA and has been authorized for detection and/or diagnosis of SARS-CoV-2 by FDA under an Emergency Use Authorization (EUA). This EUA will remain in effect (meaning this test can be used) for the duration of the COVID-19 declaration under Section 564(b)(1) of the Act, 21 U.S.C. section 360bbb-3(b)(1), unless the authorization is terminated or revoked.  Performed at [facility], [address], [place], [place] [address]   Blood culture (routine x 2)     Status: None (Preliminary result)   Collection Time: 11/14/24  8:57 AM   Specimen: BLOOD  Result Value Ref Range Status   Specimen Description BLOOD SITE NOT SPECIFIED  Final   Special Requests   Final    BOTTLES DRAWN AEROBIC AND ANAEROBIC Blood Culture results may not be optimal due to an inadequate volume of blood received in culture bottles   Culture   Final    NO  GROWTH 4 DAYS Performed at [facility], [address] [place], [place] [address]    Report Status PENDING  Incomplete  MRSA Next Gen by PCR, Nasal     Status: Abnormal   Collection Time: 11/14/24  8:58 AM   Specimen: Nasal Mucosa; Nasal Swab  Result Value Ref Range Status   MRSA by PCR Next Gen DETECTED (A) NOT DETECTED Final    Comment: (NOTE) The GeneXpert MRSA Assay (FDA approved for NASAL specimens only), is one component of a comprehensive MRSA colonization surveillance program. It is not intended to diagnose MRSA infection nor to guide or monitor treatment for MRSA infections. Test performance is not FDA approved in patients less than 14 years old. Performed at [facility], [address], [place], [place] [address]   Blood culture (routine x 2)     Status: None (Preliminary result)   Collection Time: 11/14/24  4:00 PM   Specimen: BLOOD RIGHT HAND  Result Value Ref Range Status   Specimen Description BLOOD RIGHT HAND  Final   Special Requests   Final    BOTTLES DRAWN AEROBIC ONLY Blood Culture adequate volume   Culture   Final    NO GROWTH 4 DAYS Performed at [facility], [address] [place], [place] [address]    Report Status PENDING  Incomplete  MRSA Next Gen by PCR, Nasal     Status: Abnormal   Collection Time: 11/14/24  4:22 PM   Specimen: Nasal Mucosa; Nasal Swab  Result Value Ref Range Status   MRSA by PCR Next Gen DETECTED (A) NOT DETECTED Final    Comment: RESULT CALLED TO, READ BACK BY AND VERIFIED WITH: DOROTHA BATTIEST, RN [phone] 11/15/24 BY VIN (NOTE) The GeneXpert MRSA Assay (FDA approved for NASAL specimens only), is one component of a comprehensive MRSA colonization surveillance program. It is not intended to diagnose MRSA infection nor to guide or monitor treatment for MRSA infections. Test performance is not FDA approved in patients less than 80 years old. Performed at [facility], [address] [place], [place] [address]   Culture, Respiratory w Gram Stain     Status: None   Collection Time: 11/14/24  4:47 PM   Specimen: Tracheal Aspirate; Respiratory  Result Value Ref Range Status   Specimen  Description   Final    TRACHEAL ASPIRATE Performed at [facility], [address] [place], [place] [address]    Special Requests   Final    NONE Performed at [facility], [address] [place], [place] [address]    Gram Stain   Final    FEW WBC PRESENT, PREDOMINANTLY PMN FEW GRAM POSITIVE COCCI RARE GRAM NEGATIVE RODS Performed at [facility], [address], [place], [place] [address]    Culture   Final    ABUNDANT PSEUDOMONAS AERUGINOSA MODERATE PROTEUS MIRABILIS    Report Status 11/17/2024 FINAL  Final   Organism ID, Bacteria PSEUDOMONAS AERUGINOSA  Final   Organism ID, Bacteria PROTEUS MIRABILIS  Final      Susceptibility   Pseudomonas aeruginosa - MIC*    MEROPENEM  1 SENSITIVE Sensitive     CIPROFLOXACIN 0.25 SENSITIVE Sensitive     IMIPENEM 2 SENSITIVE Sensitive     PIP/TAZO Value in next row Sensitive      <=4 SENSITIVEThis is a modified FDA-approved test that has been validated and its performance characteristics determined by the reporting laboratory.  This laboratory is certified under the Clinical Laboratory Improvement Amendments CLIA as qualified to perform high complexity clinical laboratory testing.    CEFEPIME  Value in next row Sensitive      <=4 SENSITIVEThis is a modified FDA-approved test that has been validated and its performance characteristics determined by the reporting laboratory.  This laboratory is certified under the Clinical Laboratory Improvement Amendments CLIA as qualified to perform high complexity clinical laboratory testing.    CEFTAZIDIME/AVIBACTAM Value in next row Sensitive      <=4 SENSITIVEThis is a modified FDA-approved test that has been validated and its performance characteristics  determined by the reporting laboratory.  This laboratory is certified under the Clinical Laboratory Improvement Amendments CLIA as qualified to perform high complexity clinical laboratory testing.    CEFTOLOZANE/TAZOBACTAM Value in next row Sensitive      <=4 SENSITIVEThis is a modified FDA-approved test that has been validated and its performance characteristics determined by the reporting laboratory.  This laboratory is certified under the Clinical Laboratory Improvement Amendments CLIA as qualified to perform high complexity clinical laboratory testing.    TOBRAMYCIN Value in next row Sensitive      <=4 SENSITIVEThis is a modified FDA-approved test that has been validated and its performance characteristics determined by the reporting laboratory.  This laboratory is certified under the Clinical Laboratory Improvement Amendments CLIA as qualified to perform high complexity clinical laboratory testing.    CEFTAZIDIME Value in next row Sensitive      <=4 SENSITIVEThis is a modified FDA-approved test that has been validated and its performance characteristics determined by the reporting laboratory.  This laboratory is certified under the Clinical Laboratory Improvement Amendments CLIA as qualified to perform high complexity clinical laboratory testing.    * ABUNDANT PSEUDOMONAS AERUGINOSA   Proteus mirabilis - MIC*    AMPICILLIN Value in next row Sensitive      <=4 SENSITIVEThis is a modified FDA-approved test that has been validated and its performance characteristics determined by the reporting laboratory.  This laboratory is certified under the Clinical Laboratory Improvement Amendments CLIA as qualified to perform high complexity clinical laboratory testing.    CEFAZOLIN (NON-URINE) Value in next row Sensitive      <=4 SENSITIVEThis is a modified FDA-approved test that has been validated and its performance characteristics determined by the reporting laboratory.  This laboratory  is certified under the  Clinical Laboratory Improvement Amendments CLIA as qualified to perform high complexity clinical laboratory testing.    CEFEPIME  Value in next row Sensitive      <=4 SENSITIVEThis is a modified FDA-approved test that has been validated and its performance characteristics determined by the reporting laboratory.  This laboratory is certified under the Clinical Laboratory Improvement Amendments CLIA as qualified to perform high complexity clinical laboratory testing.    ERTAPENEM  Value in next row Sensitive      <=4 SENSITIVEThis is a modified FDA-approved test that has been validated and its performance characteristics determined by the reporting laboratory.  This laboratory is certified under the Clinical Laboratory Improvement Amendments CLIA as qualified to perform high complexity clinical laboratory testing.    CEFTRIAXONE  Value in next row Sensitive      <=4 SENSITIVEThis is a modified FDA-approved test that has been validated and its performance characteristics determined by the reporting laboratory.  This laboratory is certified under the Clinical Laboratory Improvement Amendments CLIA as qualified to perform high complexity clinical laboratory testing.    CIPROFLOXACIN Value in next row Sensitive      <=4 SENSITIVEThis is a modified FDA-approved test that has been validated and its performance characteristics determined by the reporting laboratory.  This laboratory is certified under the Clinical Laboratory Improvement Amendments CLIA as qualified to perform high complexity clinical laboratory testing.    GENTAMICIN Value in next row Sensitive      <=4 SENSITIVEThis is a modified FDA-approved test that has been validated and its performance characteristics determined by the reporting laboratory.  This laboratory is certified under the Clinical Laboratory Improvement Amendments CLIA as qualified to perform high complexity clinical laboratory testing.    MEROPENEM  Value in next row Sensitive      <=4  SENSITIVEThis is a modified FDA-approved test that has been validated and its performance characteristics determined by the reporting laboratory.  This laboratory is certified under the Clinical Laboratory Improvement Amendments CLIA as qualified to perform high complexity clinical laboratory testing.    TRIMETH /SULFA  Value in next row Resistant      <=4 SENSITIVEThis is a modified FDA-approved test that has been validated and its performance characteristics determined by the reporting laboratory.  This laboratory is certified under the Clinical Laboratory Improvement Amendments CLIA as qualified to perform high complexity clinical laboratory testing.    AMPICILLIN/SULBACTAM Value in next row Sensitive      <=4 SENSITIVEThis is a modified FDA-approved test that has been validated and its performance characteristics determined by the reporting laboratory.  This laboratory is certified under the Clinical Laboratory Improvement Amendments CLIA as qualified to perform high complexity clinical laboratory testing.    PIP/TAZO Value in next row Sensitive      <=4 SENSITIVEThis is a modified FDA-approved test that has been validated and its performance characteristics determined by the reporting laboratory.  This laboratory is certified under the Clinical Laboratory Improvement Amendments CLIA as qualified to perform high complexity clinical laboratory testing.    * MODERATE PROTEUS MIRABILIS  Gastrointestinal Panel by PCR , Stool     Status: None   Collection Time: 11/15/24  6:00 PM   Specimen: Stool  Result Value Ref Range Status   Campylobacter species NOT DETECTED NOT DETECTED Final   Plesimonas shigelloides NOT DETECTED NOT DETECTED Final   Salmonella species NOT DETECTED NOT DETECTED Final   Yersinia enterocolitica NOT DETECTED NOT DETECTED Final   Vibrio species NOT DETECTED NOT DETECTED Final  Vibrio cholerae NOT DETECTED NOT DETECTED Final   Enteroaggregative E coli (EAEC) NOT DETECTED NOT  DETECTED Final   Enteropathogenic E coli (EPEC) NOT DETECTED NOT DETECTED Final   Enterotoxigenic E coli (ETEC) NOT DETECTED NOT DETECTED Final   Shiga like toxin producing E coli (STEC) NOT DETECTED NOT DETECTED Final   Shigella/Enteroinvasive E coli (EIEC) NOT DETECTED NOT DETECTED Final   Cryptosporidium NOT DETECTED NOT DETECTED Final   Cyclospora cayetanensis NOT DETECTED NOT DETECTED Final   Entamoeba histolytica NOT DETECTED NOT DETECTED Final   Giardia lamblia NOT DETECTED NOT DETECTED Final   Adenovirus F40/41 NOT DETECTED NOT DETECTED Final   Astrovirus NOT DETECTED NOT DETECTED Final   Norovirus GI/GII NOT DETECTED NOT DETECTED Final   Rotavirus A NOT DETECTED NOT DETECTED Final   Sapovirus (I, II, IV, and V) NOT DETECTED NOT DETECTED Final    Comment: Performed at [facility], [address] [place], [place] [address]  C Difficile Quick Screen w PCR reflex     Status: None   Collection Time: 11/15/24  6:00 PM   Specimen: STOOL  Result Value Ref Range Status   C Diff antigen NEGATIVE NEGATIVE Final   C Diff toxin NEGATIVE NEGATIVE Final   C Diff interpretation No C. difficile detected.  Final    Comment: Performed at [facility], [address] [place], [place] [address]     Labs:  CBC: Recent Labs  Lab 11/14/24 1600 11/15/24 0236 11/16/24 0753 11/17/24 0106 11/18/24 0305  WBC 20.7* 12.7* 12.1* 9.1 9.4  HGB 13.1 11.1* 11.3* 10.2* 10.5*  HCT 41.5 34.7* 36.5* 31.4* 32.1*  MCV 90.6 89.4 90.6 89.2 89.2  PLT 357 303 325 309 294   BMP &GFR Recent Labs  Lab 11/15/24 0236 11/16/24 0753 11/17/24 0106 11/17/24 0108 11/18/24 0305  NA 138 140 138 138 138  K 3.4* 3.8 3.3* 3.3* 3.9  CL 98 101 101 101 101  CO2 33* 33* 30 31 31   GLUCOSE 139* 158* 101* 101* 124*  BUN 17 16 20 20 23   CREATININE <0.30* <0.30* <0.30* <0.30* <0.30*  CALCIUM 9.0 8.8* 8.2* 8.2* 8.4*  MG 2.4 2.4 2.1  --  2.2  PHOS  --  2.7 2.0*  --  1.8*   CrCl cannot be  calculated (This lab value cannot be used to calculate CrCl because it is not a number: <0.30). Liver & Pancreas: Recent Labs  Lab 11/14/24 0737 11/16/24 0753 11/17/24 0106 11/18/24 0305  AST 21  --   --   --   ALT 16  --   --   --   ALKPHOS 149*  --   --   --   BILITOT 0.6  --   --   --   PROT 9.0*  --   --   --   ALBUMIN 3.6 3.1* 2.8* 2.8*   No results for input(s): LIPASE, AMYLASE in the last 168 hours. Recent Labs  Lab 11/17/24 0106  AMMONIA 31   Diabetic: No results for input(s): HGBA1C in the last 72 hours. Recent Labs  Lab 11/18/24 1521 11/18/24 1926 11/18/24 2344 11/19/24 0317 11/19/24 0725  GLUCAP 95 90 88 152* 141*   Cardiac Enzymes: No results for input(s): CKTOTAL, CKMB, CKMBINDEX, TROPONINI in the last 168 hours. Recent Labs    11/14/24 0857  PROBNP 413.0*   Coagulation Profile: No results for input(s): INR, PROTIME in the last 168 hours. Thyroid Function Tests: No results for input(s):  TSH, T4TOTAL, FREET4, T3FREE, THYROIDAB in the last 72 hours. Lipid Profile: No results for input(s): CHOL, HDL, LDLCALC, TRIG, CHOLHDL, LDLDIRECT in the last 72 hours. Anemia Panel: No results for input(s): VITAMINB12, FOLATE, FERRITIN, TIBC, IRON, RETICCTPCT in the last 72 hours. Urine analysis:    Component Value Date/Time   COLORURINE YELLOW 12/27/2023 2310   APPEARANCEUR CLOUDY (A) 12/27/2023 2310   LABSPEC 1.008 12/27/2023 2310   PHURINE 5.0 12/27/2023 2310   GLUCOSEU NEGATIVE 12/27/2023 2310   HGBUR SMALL (A) 12/27/2023 2310   BILIRUBINUR NEGATIVE 12/27/2023 2310   KETONESUR NEGATIVE 12/27/2023 2310   PROTEINUR NEGATIVE 12/27/2023 2310   NITRITE POSITIVE (A) 12/27/2023 2310   LEUKOCYTESUR LARGE (A) 12/27/2023 2310   Sepsis Labs: Invalid input(s): PROCALCITONIN, LACTICIDVEN   SIGNED:  Meghin Thivierge T Laytoya Ion, MD  [facility] 11/19/2024, 7:45 AM   "

## 2024-11-19 NOTE — TOC Transition Note (Signed)
 Transition of Care [facility]) - Discharge Note   Patient Details  Name: Andrew Rivera MRN: [id number] Date of Birth: [birth year]  [facility]) CM/SW Contact:  Jon ONEIDA Anon, RN Phone Number: 11/19/2024, 10:43 AM   Clinical Narrative:    Pt will discharge back to [facility]. RN given number to call report to [phone] to room 303. [facility] called and spoke with Zachary, states will have a truck available by 12Noon for pt transport. DC packet placed at RN station. No further ICM needs identified at this time. ICM will sign off.     Final next level of care: Skilled Nursing Facility ([facility]) Barriers to Discharge: Barriers Resolved   Patient Goals and CMS Choice Patient states their goals for this hospitalization and ongoing recovery are:: Return to [facility] CMS Medicare.gov Compare Post Acute Care list provided to:: Patient Represenative (must comment) (Mishkin,Donna- pt spouse) Choice offered to / list presented to : Spouse [place] ownership interest in [facility].provided to:: Spouse    Discharge Placement              Patient chooses bed at: Other - please specify in the comment section below: ([facility]) Patient to be transferred to facility by: [facility] Name of family member notified: Lamel, Mccarley  Spouse,[phone] Patient and family notified of of transfer: 11/19/24  Discharge Plan and Services Additional resources added to the After Visit Summary for   In-house Referral: NA Discharge Planning Services: CM Consult Post Acute Care Choice: Long Term Acute Care (LTAC)          DME Arranged: N/A DME Agency: NA       HH Arranged: NA HH Agency: NA        Social Drivers of Health (SDOH) Interventions SDOH Screenings   Food Insecurity: Patient Unable To Answer (11/15/2024)  Housing: Unknown (11/15/2024)  Transportation Needs: Patient Unable To Answer (11/15/2024)  Utilities: Patient Unable To Answer (11/15/2024)  Social  Connections: Patient Unable To Answer (11/15/2024)  Tobacco Use: Medium Risk (11/14/2024)     Readmission Risk Interventions    11/17/2024   11:44 AM 01/08/2024    9:50 AM 09/10/2023    3:49 PM  Readmission Risk Prevention Plan  Transportation Screening Complete Complete Complete  PCP or Specialist Appt within 5-7 Days   Complete  PCP or Specialist Appt within 3-5 Days Complete Complete   Home Care Screening   Complete  Medication Review (RN CM)   Complete  HRI or Home Care Consult Complete Complete   Social Work Consult for Recovery Care Planning/Counseling Complete Complete   Palliative Care Screening Not Applicable Not Applicable   Medication Review Oceanographer) Complete Referral to Pharmacy
